# Patient Record
Sex: Male | Born: 1940
Health system: Southern US, Community
[De-identification: ages and names within clinical notes are randomized; demographics above are authoritative.]

## PROBLEM LIST (undated history)

## (undated) DIAGNOSIS — C229 Malignant neoplasm of liver, not specified as primary or secondary: Secondary | ICD-10-CM

## (undated) DIAGNOSIS — I639 Cerebral infarction, unspecified: Secondary | ICD-10-CM

## (undated) DIAGNOSIS — R29898 Other symptoms and signs involving the musculoskeletal system: Secondary | ICD-10-CM

## (undated) DIAGNOSIS — N400 Enlarged prostate without lower urinary tract symptoms: Secondary | ICD-10-CM

## (undated) DIAGNOSIS — Z45018 Encounter for adjustment and management of other part of cardiac pacemaker: Secondary | ICD-10-CM

## (undated) DIAGNOSIS — K7460 Unspecified cirrhosis of liver: Secondary | ICD-10-CM

## (undated) DIAGNOSIS — I4891 Unspecified atrial fibrillation: Secondary | ICD-10-CM

## (undated) DIAGNOSIS — I8390 Asymptomatic varicose veins of unspecified lower extremity: Secondary | ICD-10-CM

## (undated) DIAGNOSIS — Z950 Presence of cardiac pacemaker: Secondary | ICD-10-CM

## (undated) DIAGNOSIS — R0602 Shortness of breath: Secondary | ICD-10-CM

## (undated) HISTORY — PX: TONSILLECTOMY: SUR1361

## (undated) HISTORY — PX: LIVER RESECTION: SHX1977

## (undated) HISTORY — PX: EYE SURGERY: SHX253

## (undated) HISTORY — PX: OTHER SURGICAL HISTORY: SHX169

## (undated) HISTORY — DX: Asymptomatic varicose veins of unspecified lower extremity: I83.90

## (undated) HISTORY — PX: APPENDECTOMY: SHX54

## (undated) HISTORY — DX: Shortness of breath: R06.02

---
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
## 2005-05-21 ENCOUNTER — Encounter: Admission: RE | Admit: 2005-05-21 | Discharge: 2005-08-19 | Payer: Self-pay | Admitting: Ophthalmology

## 2007-08-22 ENCOUNTER — Encounter: Admission: RE | Admit: 2007-08-22 | Discharge: 2007-08-22 | Payer: Self-pay

## 2007-10-01 ENCOUNTER — Ambulatory Visit (HOSPITAL_COMMUNITY): Admission: AD | Admit: 2007-10-01 | Discharge: 2007-10-02 | Payer: Self-pay | Admitting: Orthopaedic Surgery

## 2008-02-02 ENCOUNTER — Encounter: Admission: RE | Admit: 2008-02-02 | Discharge: 2008-02-02 | Payer: Self-pay | Admitting: *Deleted

## 2009-04-03 ENCOUNTER — Ambulatory Visit (HOSPITAL_COMMUNITY): Admission: RE | Admit: 2009-04-03 | Discharge: 2009-04-03 | Payer: Self-pay | Admitting: Internal Medicine

## 2009-05-26 ENCOUNTER — Ambulatory Visit: Payer: Self-pay | Admitting: Internal Medicine

## 2009-05-26 ENCOUNTER — Inpatient Hospital Stay (HOSPITAL_COMMUNITY): Admission: EM | Admit: 2009-05-26 | Discharge: 2009-06-01 | Payer: Self-pay | Admitting: Emergency Medicine

## 2009-09-08 ENCOUNTER — Emergency Department (HOSPITAL_COMMUNITY): Admission: EM | Admit: 2009-09-08 | Discharge: 2009-09-08 | Payer: Self-pay | Admitting: Emergency Medicine

## 2010-06-27 LAB — LACTIC ACID, PLASMA: Lactic Acid, Venous: 2.2 mmol/L (ref 0.5–2.2)

## 2010-06-27 LAB — GLUCOSE, CAPILLARY
Glucose-Capillary: 125 mg/dL — ABNORMAL HIGH (ref 70–99)
Glucose-Capillary: 133 mg/dL — ABNORMAL HIGH (ref 70–99)
Glucose-Capillary: 134 mg/dL — ABNORMAL HIGH (ref 70–99)
Glucose-Capillary: 143 mg/dL — ABNORMAL HIGH (ref 70–99)
Glucose-Capillary: 146 mg/dL — ABNORMAL HIGH (ref 70–99)
Glucose-Capillary: 148 mg/dL — ABNORMAL HIGH (ref 70–99)
Glucose-Capillary: 167 mg/dL — ABNORMAL HIGH (ref 70–99)
Glucose-Capillary: 169 mg/dL — ABNORMAL HIGH (ref 70–99)
Glucose-Capillary: 173 mg/dL — ABNORMAL HIGH (ref 70–99)
Glucose-Capillary: 206 mg/dL — ABNORMAL HIGH (ref 70–99)
Glucose-Capillary: 229 mg/dL — ABNORMAL HIGH (ref 70–99)
Glucose-Capillary: 242 mg/dL — ABNORMAL HIGH (ref 70–99)
Glucose-Capillary: 272 mg/dL — ABNORMAL HIGH (ref 70–99)
Glucose-Capillary: 273 mg/dL — ABNORMAL HIGH (ref 70–99)
Glucose-Capillary: 63 mg/dL — ABNORMAL LOW (ref 70–99)
Glucose-Capillary: 65 mg/dL — ABNORMAL LOW (ref 70–99)
Glucose-Capillary: 75 mg/dL (ref 70–99)

## 2010-06-27 LAB — BASIC METABOLIC PANEL
BUN: 16 mg/dL (ref 6–23)
BUN: 20 mg/dL (ref 6–23)
CO2: 22 mEq/L (ref 19–32)
CO2: 23 mEq/L (ref 19–32)
CO2: 23 mEq/L (ref 19–32)
Calcium: 8.2 mg/dL — ABNORMAL LOW (ref 8.4–10.5)
Calcium: 8.4 mg/dL (ref 8.4–10.5)
Creatinine, Ser: 0.84 mg/dL (ref 0.4–1.5)
Creatinine, Ser: 0.84 mg/dL (ref 0.4–1.5)
GFR calc Af Amer: >60 mL/min (ref >60)
GFR calc non Af Amer: >60 mL/min (ref >60)
Glucose, Bld: 101 mg/dL — ABNORMAL HIGH (ref 70–99)
Glucose, Bld: 199 mg/dL — ABNORMAL HIGH (ref 70–99)
Glucose, Bld: 224 mg/dL — ABNORMAL HIGH (ref 70–99)
Potassium: 4 mEq/L (ref 3.5–5.1)
Sodium: 136 mEq/L (ref 135–145)
Sodium: 137 mEq/L (ref 135–145)

## 2010-06-27 LAB — DIFFERENTIAL
Basophils Relative: 0 % (ref 0–1)
Eosinophils Absolute: 0 10*3/uL (ref 0.0–0.7)
Eosinophils Relative: 0 % (ref 0–5)
Lymphocytes Relative: 3 % — ABNORMAL LOW (ref 12–46)
Lymphs Abs: 0.1 10*3/uL — ABNORMAL LOW (ref 0.7–4.0)
Neutrophils Relative %: 87 % — ABNORMAL HIGH (ref 43–77)

## 2010-06-27 LAB — POCT I-STAT, CHEM 8
Calcium, Ion: 1.09 mmol/L — ABNORMAL LOW (ref 1.12–1.32)
Chloride: 107 mEq/L (ref 96–112)
Creatinine, Ser: 1.1 mg/dL (ref 0.4–1.5)
Glucose, Bld: 126 mg/dL — ABNORMAL HIGH (ref 70–99)
Hemoglobin: 10.5 g/dL — ABNORMAL LOW (ref 13.0–17.0)
TCO2: 22 mmol/L (ref 0–100)

## 2010-06-27 LAB — CBC
HCT: 30.4 % — ABNORMAL LOW (ref 39.0–52.0)
Hemoglobin: 10.4 g/dL — ABNORMAL LOW (ref 13.0–17.0)
Hemoglobin: 9.6 g/dL — ABNORMAL LOW (ref 13.0–17.0)
MCHC: 34 g/dL (ref 30.0–36.0)
MCHC: 34.3 g/dL (ref 30.0–36.0)
MCHC: 34.3 g/dL (ref 30.0–36.0)
MCV: 91.6 fL (ref 78.0–100.0)
Platelets: 62 10*3/uL — ABNORMAL LOW (ref 150–400)
Platelets: 73 10*3/uL — ABNORMAL LOW (ref 150–400)
RBC: 3.4 MIL/uL — ABNORMAL LOW (ref 4.22–5.81)
RDW: 19.2 % — ABNORMAL HIGH (ref 11.5–15.5)
RDW: 19.6 % — ABNORMAL HIGH (ref 11.5–15.5)
RDW: 20.1 % — ABNORMAL HIGH (ref 11.5–15.5)
WBC: 5 10*3/uL (ref 4.0–10.5)

## 2010-06-27 LAB — URINALYSIS, ROUTINE W REFLEX MICROSCOPIC
Leukocytes, UA: NEGATIVE
Nitrite: NEGATIVE
Specific Gravity, Urine: 1.022 (ref 1.005–1.030)
pH: 5.5 (ref 5.0–8.0)

## 2010-06-27 LAB — LEGIONELLA ANTIGEN, URINE: Legionella Antigen, Urine: NEGATIVE

## 2010-06-27 LAB — CULTURE, BLOOD (ROUTINE X 2)
Culture: NO GROWTH
Culture: NO GROWTH

## 2010-06-27 LAB — HEPATIC FUNCTION PANEL
Albumin: 2.9 g/dL — ABNORMAL LOW (ref 3.5–5.2)
Bilirubin, Direct: 0.2 mg/dL (ref 0.0–0.3)
Indirect Bilirubin: 0.5 mg/dL (ref 0.3–0.9)
Total Protein: 6.3 g/dL (ref 6.0–8.3)

## 2010-06-27 LAB — POCT I-STAT 3, ART BLOOD GAS (G3+)
Acid-base deficit: 2 mmol/L (ref 0.0–2.0)
pCO2 arterial: 29.7 mmHg — ABNORMAL LOW (ref 35.0–45.0)

## 2010-06-27 LAB — URINE MICROSCOPIC-ADD ON

## 2010-06-27 LAB — STREP PNEUMONIAE URINARY ANTIGEN: Strep Pneumo Urinary Antigen: NEGATIVE

## 2010-06-27 LAB — MRSA PCR SCREENING: MRSA by PCR: POSITIVE — AB

## 2010-06-27 LAB — CK TOTAL AND CKMB (NOT AT ARMC)
CK, MB: 1.1 ng/mL (ref 0.3–4.0)
Relative Index: INVALID (ref 0.0–2.5)

## 2010-06-27 LAB — URINE CULTURE

## 2010-06-27 LAB — PROTIME-INR
INR: 2.62 — ABNORMAL HIGH (ref 0.00–1.49)
INR: 2.69 — ABNORMAL HIGH (ref 0.00–1.49)
INR: 2.85 — ABNORMAL HIGH (ref 0.00–1.49)
Prothrombin Time: 23.5 seconds — ABNORMAL HIGH (ref 11.6–15.2)
Prothrombin Time: 23.9 seconds — ABNORMAL HIGH (ref 11.6–15.2)
Prothrombin Time: 36.1 seconds — ABNORMAL HIGH (ref 11.6–15.2)

## 2010-06-27 LAB — TROPONIN I: Troponin I: 0.06 ng/mL (ref 0.00–0.06)

## 2010-06-27 LAB — BRAIN NATRIURETIC PEPTIDE: Pro B Natriuretic peptide (BNP): 382 pg/mL — ABNORMAL HIGH (ref 0.0–100.0)

## 2010-06-27 LAB — CORTISOL: Cortisol, Plasma: 19.9 ug/dL

## 2010-07-26 ENCOUNTER — Encounter (INDEPENDENT_AMBULATORY_CARE_PROVIDER_SITE_OTHER): Payer: PRIVATE HEALTH INSURANCE

## 2010-07-26 DIAGNOSIS — M79609 Pain in unspecified limb: Secondary | ICD-10-CM

## 2010-07-31 NOTE — Procedures (Unsigned)
DUPLEX DEEP VENOUS EXAM - LOWER EXTREMITY  INDICATION:  Previous DVT, previous lymphoma, right thigh mass for 2 weeks.  HISTORY:  Edema:  Swelling for 2 weeks. Trauma/Surgery:  No. Pain:  Yes for 1 week. PE:  No. Previous DVT:  Left popliteal 2010. Anticoagulants:  Long-term Coumadin. Other:  DUPLEX EXAM:               CFV   SFV   PopV  PTV    GSV               R  L  R  L  R  L  R   L  R  L Thrombosis    0  0  0     0     0      0 Spontaneous   +  +  +     +     +      + Phasic        +  +  +     +     +      + Augmentation  +  +  +     +     +      + Compressible  +  +  +     +     +      + Competent     0     0     0     0      0  Legend:  + - yes  o - no  p - partial  D - decreased  IMPRESSION: 1. No evidence of deep venous thrombosis or superficial venous     thrombophlebitis in the right lower extremity.  There is reflux     observed throughout the deep and superficial venous system. 2. Incidentally, there is a complex mass in the proximal medial thigh     that appears to have vascular flow.  Further imaging suggested to     better characterize this area, clinical correlation recommended.    _____________________________ V. Leia Alf, MD  LT/MEDQ  D:  07/26/2010  T:  07/26/2010  Job:  [id number]

## 2010-08-21 NOTE — Op Note (Signed)
NAME:  KAIS, MONJE          ACCOUNT NO.:  [id number]   MEDICAL RECORD NO.:  [id number]          PATIENT TYPE:  OIB   LOCATION:  2550                         FACILITY:  [facility]   PHYSICIAN:  Lind Guest. Ninfa Linden, M.D.DATE OF BIRTH:  [birth year]   DATE OF PROCEDURE:  DATE OF DISCHARGE:                               OPERATIVE REPORT   PREOPERATIVE DIAGNOSES:  1. Left thumb hand and wrist infectious tenosynovitis status post dog      bite.  2. Traumatic extensor pollicis longus (EPL) tendon rupture.   POSTOPERATIVE DIAGNOSES:  1. Left thumb hand and wrist infectious tenosynovitis status post dog      bite.  2. Traumatic extensor pollicis longus (EPL) tendon rupture.   PROCEDURE:  1. Irrigation and debridement of left thumb wound and left first,      second, and third dorsal extensor compartments.  2. Delayed direct primary repair of left thumb extensor pollicis      longus tendon.   SURGEON:  Mcarthur Rossetti, MD   ANESTHESIA:  Axillary block.   ANTIBIOTICS:  1 gram vancomycin after cultures obtained.   TOURNIQUET TIME:  Less than 1 hour.   ESTIMATED BLOOD LOSS:  Minimal.   COMPLICATIONS:  None.   FINDINGS:  Rupture of extensor pollicis longus tendon left thumb and  infectious tenosynovitis involving the EPL tendon first, second, and  third dorsal compartments.  Cultures pending, no gross purulence  encountered.   INDICATIONS:  Briefly Mr. Keith [name] is a 70 year old gentleman who 8  days ago was bitten by his own dog at home when he was breaking up a  fight between his 2 little lap dogs.  He had bite wound on the dorsum of  his thumb near the MCP joint of the thumb as well as in the palm.  He  was seen by his primary care physician and appropriate was started on  antibiotics including amoxicillin and Levaquin.  He eventually had  redness and streaking in his hand and his arm with this, that seemed to  calm down with his antibiotics.  However, he had  inability to fully  extend his thumb.  He did not relay that to his primary care physician.  However, after waking up this morning and having some sloughing of the  skin.  He was concerned enough and consultation was made to my office.  On examination, it was felt he still had residual symptoms of infection,  but more importantly he also had tenosynovitis of the dorsal  compartments in the wrist and hand as well as a rupture of the EPL  tendon.  I recommended he undergo irrigation debridement and attempted  repair of the tendon.  The risks and benefits of this were explained him  in length and he agrees to proceed with surgery.   DESCRIPTION OF PROCEDURE:  After informed consent was obtained, the  appropriate left arm was marked and axillary block was obtained by  anesthesia.  He was then comfortably taken to the operating room, placed  supine on the operating table.  A nonsterile tourniquet was placed  around his upper arm.  His arm was  prepped and draped with Hibiclens.  A  time-out was called to identify the correct patient and correct left  arm.  I then had the tourniquet inflated to 250 mm of pressure, tested  that adequate anesthesia with axillary block was obtained and then I  started the incision with a puncture wound of the thumb.  I carried this  proximal and distally all the way down thumb metacarpal level down to  the first dorsal compartment.  There was a soupy fluid along this area,  did not appear gross purulent, but the tissue itself just looked  terrible.  You could see that there was severing of the EPL tendon and  the distal stump appeared as if  were the end of a mop.  I then made  decision to continue to explore to find the proximal stump of the EPL  tendon.  I made an incision over the second and third dorsal compartment  centered over Lister's tubercle.  I was able to dissect down to the  second and third of compartment, both of these had abundant fluid  suggesting  infectious tenosynovitis as well.  The EPL tendon itself was  found that by Lister's tubercle and just really looked ratty.  I then  removed the EPL tendon from its position ulnar to Lister's tubercle and  brought it  more radial direction so I could hopefully obtain a direct  primary repair of the tendon.  I was able to bring the final proximal  stump of the tendon and bring it back to resting position near its  distal separate component both ends looked again very tenuous.  I then  placed a 2-0 FiberWire suture to bring this together as a direct primary  repair, it did leave the thumb and IP joint in slight hyperextended  position but I was able to put the thumb through some slight flexion  extension and felt that this was intact.  I then copiously irrigated the  tissue again with a full liter of normal saline solution followed by 500  mL of bacitracin solution followed by an additional liter of normal  saline solution through the tendon sheath as well.  I then closed the  skin through both incisions loosely with interrupted 3-0 nylon suture.  Xeroform followed up with well-padded sterile dressing was applied.  I  put the thumb and wrist in a splint with the wrist slightly dorsiflexed  to take pressure off the tendon repair.  The tourniquet was let down and  the fingers did pink nicely.  He remained awake throughout the case.  He  was taken to recovery room in stable condition.  All final counts were  correct and there no complications noted.  Postoperatively, he will be  admitted for 23-hour observation for IV antibiotics with hopeful  discharge in the morning.      Lind Guest. Ninfa Linden, M.D.  Electronically Signed     CYB/MEDQ  D:  10/01/2007  T:  10/02/2007  Job:  [id number]

## 2010-11-03 ENCOUNTER — Encounter: Payer: Self-pay | Admitting: Emergency Medicine

## 2010-11-03 ENCOUNTER — Emergency Department (HOSPITAL_BASED_OUTPATIENT_CLINIC_OR_DEPARTMENT_OTHER)
Admission: EM | Admit: 2010-11-03 | Discharge: 2010-11-03 | Disposition: A | Discharge status: Home / Self Care | Payer: Medicare Other | Attending: Emergency Medicine | Admitting: Emergency Medicine

## 2010-11-03 ENCOUNTER — Emergency Department (INDEPENDENT_AMBULATORY_CARE_PROVIDER_SITE_OTHER): Payer: Medicare Other

## 2010-11-03 DIAGNOSIS — Z8679 Personal history of other diseases of the circulatory system: Secondary | ICD-10-CM | POA: Insufficient documentation

## 2010-11-03 DIAGNOSIS — R161 Splenomegaly, not elsewhere classified: Secondary | ICD-10-CM

## 2010-11-03 DIAGNOSIS — K7460 Unspecified cirrhosis of liver: Secondary | ICD-10-CM

## 2010-11-03 DIAGNOSIS — I10 Essential (primary) hypertension: Secondary | ICD-10-CM | POA: Insufficient documentation

## 2010-11-03 DIAGNOSIS — R109 Unspecified abdominal pain: Secondary | ICD-10-CM

## 2010-11-03 DIAGNOSIS — R42 Dizziness and giddiness: Secondary | ICD-10-CM

## 2010-11-03 DIAGNOSIS — K5289 Other specified noninfective gastroenteritis and colitis: Secondary | ICD-10-CM | POA: Insufficient documentation

## 2010-11-03 DIAGNOSIS — E119 Type 2 diabetes mellitus without complications: Secondary | ICD-10-CM | POA: Insufficient documentation

## 2010-11-03 HISTORY — DX: Unspecified cirrhosis of liver: K74.60

## 2010-11-03 HISTORY — DX: Cerebral infarction, unspecified: I63.9

## 2010-11-03 HISTORY — DX: Benign prostatic hyperplasia without lower urinary tract symptoms: N40.0

## 2010-11-03 LAB — DIFFERENTIAL
Blasts: 0 %
Lymphocytes Relative: 12 % (ref 12–46)
Metamyelocytes Relative: 0 %
Monocytes Absolute: 0.7 10*3/uL (ref 0.1–1.0)
Monocytes Relative: 16 % — ABNORMAL HIGH (ref 3–12)
Promyelocytes Absolute: 0 %
nRBC: 0 /100 WBC

## 2010-11-03 LAB — COMPREHENSIVE METABOLIC PANEL
ALT: 63 U/L — ABNORMAL HIGH (ref 0–53)
Alkaline Phosphatase: 56 U/L (ref 39–117)
CO2: 21 mEq/L (ref 19–32)
Calcium: 8.8 mg/dL (ref 8.4–10.5)
Chloride: 102 mEq/L (ref 96–112)
GFR calc Af Amer: >60 mL/min (ref >60)
GFR calc non Af Amer: >60 mL/min (ref >60)
Glucose, Bld: 104 mg/dL — ABNORMAL HIGH (ref 70–99)
Sodium: 136 mEq/L (ref 135–145)
Total Bilirubin: 0.9 mg/dL (ref 0.3–1.2)

## 2010-11-03 LAB — CBC
MCHC: 35 g/dL (ref 30.0–36.0)
MCV: 88.8 fL (ref 78.0–100.0)
Platelets: 65 10*3/uL — ABNORMAL LOW (ref 150–400)
RDW: 14.6 % (ref 11.5–15.5)
WBC: 4.5 10*3/uL (ref 4.0–10.5)

## 2010-11-03 LAB — URINALYSIS, ROUTINE W REFLEX MICROSCOPIC
Bilirubin Urine: NEGATIVE
Hgb urine dipstick: NEGATIVE
Ketones, ur: NEGATIVE mg/dL
Nitrite: NEGATIVE
Protein, ur: NEGATIVE mg/dL
Specific Gravity, Urine: 1.02 (ref 1.005–1.030)
Urobilinogen, UA: 0.2 mg/dL (ref 0.0–1.0)

## 2010-11-03 LAB — GLUCOSE, CAPILLARY: Glucose-Capillary: 80 mg/dL (ref 70–99)

## 2010-11-03 LAB — PROTIME-INR: INR: 2.41 — ABNORMAL HIGH (ref 0.00–1.49)

## 2010-11-03 MED ORDER — CIPROFLOXACIN HCL 500 MG PO TABS
500.0000 mg | ORAL_TABLET | Freq: Once | ORAL | Status: AC
  Administered 2010-11-03: 500 mg via ORAL
  Filled 2010-11-03: qty 1

## 2010-11-03 MED ORDER — MORPHINE SULFATE 4 MG/ML IJ SOLN
6.0000 mg | Freq: Once | INTRAMUSCULAR | Status: AC
  Administered 2010-11-03: 4 mg via INTRAVENOUS
  Filled 2010-11-03: qty 1

## 2010-11-03 MED ORDER — METRONIDAZOLE 500 MG PO TABS: 500.0000 mg | ORAL_TABLET | Freq: Two times a day (BID) | ORAL | Status: AC

## 2010-11-03 MED ORDER — METRONIDAZOLE 500 MG PO TABS
500.0000 mg | ORAL_TABLET | Freq: Once | ORAL | Status: AC
  Administered 2010-11-03: 500 mg via ORAL
  Filled 2010-11-03: qty 1

## 2010-11-03 MED ORDER — ONDANSETRON HCL 4 MG/2ML IJ SOLN
4.0000 mg | Freq: Once | INTRAMUSCULAR | Status: AC
  Administered 2010-11-03: 4 mg via INTRAVENOUS
  Filled 2010-11-03: qty 2

## 2010-11-03 MED ORDER — SODIUM CHLORIDE 0.9 % IV SOLN
Freq: Once | INTRAVENOUS | Status: AC
  Administered 2010-11-03: 11:00:00 via INTRAVENOUS

## 2010-11-03 MED ORDER — CIPROFLOXACIN HCL 500 MG PO TABS: 500.0000 mg | ORAL_TABLET | Freq: Two times a day (BID) | ORAL | Status: AC

## 2010-11-03 NOTE — ED Notes (Signed)
Pt and family reports diarrhea 3 days ago with [place] food with increased weakness and fever last PM

## 2010-11-03 NOTE — ED Provider Notes (Addendum)
History     Chief Complaint  Patient presents with  . Dizziness   Patient is a 70 y.o. male presenting with abdominal pain. The history is provided by the patient and the spouse.  Abdominal Pain The primary symptoms of the illness include abdominal pain, fever, fatigue and diarrhea. The primary symptoms of the illness do not include shortness of breath, nausea, vomiting, hematemesis or dysuria. The current episode started 2 days ago. The onset of the illness was gradual. The problem has been gradually worsening.  The abdominal pain is located in the left flank. The abdominal pain radiates to the LLQ. The severity of the abdominal pain is 5/10. The abdominal pain is relieved by nothing.  Additional symptoms associated with the illness include chills and back pain. Symptoms associated with the illness do not include diaphoresis, urgency, hematuria or frequency.    Past Medical History  Diagnosis Date  . Diabetes mellitus   . Hypertension   . Stroke   . Prostate hypertrophy   . Cirrhosis of liver     Past Surgical History  Procedure Date  . Liver resection     Family History  Problem Relation Age of Onset  . Stroke Father     History  Substance Use Topics  . Smoking status: Never Smoker   . Smokeless tobacco: Not on file  . Alcohol Use: No      Review of Systems  Constitutional: Positive for fever, chills and fatigue. Negative for diaphoresis.  Respiratory: Negative for shortness of breath.   Gastrointestinal: Positive for abdominal pain and diarrhea. Negative for nausea, vomiting, blood in stool and hematemesis.  Genitourinary: Negative for dysuria, urgency, frequency and hematuria.  Musculoskeletal: Positive for back pain.  All other systems reviewed and are negative.    Physical Exam  BP 95/54  Pulse 73  Temp(Src) 98.8 F (37.1 C) (Oral)  Resp 22  SpO2 97%  Physical Exam  Nursing note and vitals reviewed. Constitutional: He is oriented to person, place,  and time. He appears well-developed.  HENT:  Head: Normocephalic and atraumatic.       Moist mucus membranes   Eyes: Pupils are equal, round, and reactive to light.  Cardiovascular: Normal rate, regular rhythm and normal heart sounds.   No murmur heard. Pulmonary/Chest: No respiratory distress. He has no wheezes. He has no rales. He exhibits no tenderness.  Abdominal: He exhibits no distension. There is tenderness.    Musculoskeletal: Normal range of motion. He exhibits no tenderness.  Neurological: He is alert and oriented to person, place, and time.  Skin: Skin is warm and dry. He is not diaphoretic.    ED Course  Procedures  MDM PT with left side abd pain, only mild tenderness on palpation.  Not septic appearing.  Started after eating chinese food.  On coumadin with therapeutic INR, doubt mesenteric ischemia.  Feels much better, wants to go home.  Will start cipro/flagyl, f/u with his doctor on Monday.  Advised him that he will need to have his INR checked while on abx.  His SBP is in 90s, says that it normally runs around 110.  Denies dizziness, will ambulate prior to d/c     Malvin Johns, MD 11/03/10 [place], MD 11/03/10 1443

## 2011-01-03 LAB — CBC
HCT: 37.9 — ABNORMAL LOW
Hemoglobin: 13.1
Platelets: 85 — ABNORMAL LOW
RDW: 14.1
WBC: 5.4

## 2011-01-03 LAB — BASIC METABOLIC PANEL
Calcium: 8.5
GFR calc non Af Amer: >60
Potassium: 3.1 — ABNORMAL LOW
Sodium: 139

## 2011-01-03 LAB — WOUND CULTURE: Culture: NO GROWTH

## 2011-01-03 LAB — ANAEROBIC CULTURE: Gram Stain: NONE SEEN

## 2011-01-03 LAB — DIFFERENTIAL
Basophils Absolute: 0
Eosinophils Relative: 1
Lymphocytes Relative: 30
Lymphs Abs: 1.6
Monocytes Absolute: 0.5
Neutro Abs: 3.2

## 2011-01-03 LAB — HEPATIC FUNCTION PANEL
ALT: 33
AST: 39 — ABNORMAL HIGH
Alkaline Phosphatase: 64
Indirect Bilirubin: 0.6
Total Protein: 6.5

## 2012-07-09 ENCOUNTER — Emergency Department (HOSPITAL_BASED_OUTPATIENT_CLINIC_OR_DEPARTMENT_OTHER)
Admission: EM | Admit: 2012-07-09 | Discharge: 2012-07-09 | Disposition: A | Discharge status: Home / Self Care | Payer: Medicare Other | Attending: Emergency Medicine | Admitting: Emergency Medicine

## 2012-07-09 ENCOUNTER — Encounter (HOSPITAL_BASED_OUTPATIENT_CLINIC_OR_DEPARTMENT_OTHER): Payer: Self-pay | Admitting: *Deleted

## 2012-07-09 ENCOUNTER — Emergency Department (HOSPITAL_BASED_OUTPATIENT_CLINIC_OR_DEPARTMENT_OTHER): Payer: Medicare Other

## 2012-07-09 DIAGNOSIS — S0003XA Contusion of scalp, initial encounter: Secondary | ICD-10-CM | POA: Insufficient documentation

## 2012-07-09 DIAGNOSIS — I10 Essential (primary) hypertension: Secondary | ICD-10-CM | POA: Insufficient documentation

## 2012-07-09 DIAGNOSIS — S0083XA Contusion of other part of head, initial encounter: Secondary | ICD-10-CM

## 2012-07-09 DIAGNOSIS — Z23 Encounter for immunization: Secondary | ICD-10-CM | POA: Insufficient documentation

## 2012-07-09 DIAGNOSIS — Z794 Long term (current) use of insulin: Secondary | ICD-10-CM | POA: Insufficient documentation

## 2012-07-09 DIAGNOSIS — Z7902 Long term (current) use of antithrombotics/antiplatelets: Secondary | ICD-10-CM | POA: Insufficient documentation

## 2012-07-09 DIAGNOSIS — Z79899 Other long term (current) drug therapy: Secondary | ICD-10-CM | POA: Insufficient documentation

## 2012-07-09 DIAGNOSIS — R55 Syncope and collapse: Secondary | ICD-10-CM | POA: Insufficient documentation

## 2012-07-09 DIAGNOSIS — W1789XA Other fall from one level to another, initial encounter: Secondary | ICD-10-CM | POA: Insufficient documentation

## 2012-07-09 DIAGNOSIS — Y92009 Unspecified place in unspecified non-institutional (private) residence as the place of occurrence of the external cause: Secondary | ICD-10-CM | POA: Insufficient documentation

## 2012-07-09 DIAGNOSIS — R011 Cardiac murmur, unspecified: Secondary | ICD-10-CM | POA: Insufficient documentation

## 2012-07-09 DIAGNOSIS — Z8719 Personal history of other diseases of the digestive system: Secondary | ICD-10-CM | POA: Insufficient documentation

## 2012-07-09 DIAGNOSIS — Z7901 Long term (current) use of anticoagulants: Secondary | ICD-10-CM | POA: Insufficient documentation

## 2012-07-09 DIAGNOSIS — Y9301 Activity, walking, marching and hiking: Secondary | ICD-10-CM | POA: Insufficient documentation

## 2012-07-09 DIAGNOSIS — S63509A Unspecified sprain of unspecified wrist, initial encounter: Secondary | ICD-10-CM | POA: Insufficient documentation

## 2012-07-09 DIAGNOSIS — Z87448 Personal history of other diseases of urinary system: Secondary | ICD-10-CM | POA: Insufficient documentation

## 2012-07-09 DIAGNOSIS — E119 Type 2 diabetes mellitus without complications: Secondary | ICD-10-CM | POA: Insufficient documentation

## 2012-07-09 DIAGNOSIS — Z8673 Personal history of transient ischemic attack (TIA), and cerebral infarction without residual deficits: Secondary | ICD-10-CM | POA: Insufficient documentation

## 2012-07-09 DIAGNOSIS — W19XXXA Unspecified fall, initial encounter: Secondary | ICD-10-CM

## 2012-07-09 DIAGNOSIS — S63501A Unspecified sprain of right wrist, initial encounter: Secondary | ICD-10-CM

## 2012-07-09 DIAGNOSIS — R404 Transient alteration of awareness: Secondary | ICD-10-CM | POA: Insufficient documentation

## 2012-07-09 HISTORY — DX: Malignant neoplasm of liver, not specified as primary or secondary: C22.9

## 2012-07-09 LAB — CBC WITH DIFFERENTIAL/PLATELET
Basophils Absolute: 0 10*3/uL (ref 0.0–0.1)
Basophils Relative: 0 % (ref 0–1)
Eosinophils Relative: 1 % (ref 0–5)
Hemoglobin: 12.4 g/dL — ABNORMAL LOW (ref 13.0–17.0)
MCH: 30.2 pg (ref 26.0–34.0)
MCV: 86.6 fL (ref 78.0–100.0)
Monocytes Absolute: 0.7 10*3/uL (ref 0.1–1.0)
Neutrophils Relative %: 63 % (ref 43–77)
RBC: 4.11 MIL/uL — ABNORMAL LOW (ref 4.22–5.81)

## 2012-07-09 LAB — BASIC METABOLIC PANEL
Calcium: 9 mg/dL (ref 8.4–10.5)
Chloride: 106 mEq/L (ref 96–112)
Creatinine, Ser: 1.4 mg/dL — ABNORMAL HIGH (ref 0.50–1.35)
GFR calc Af Amer: 57 mL/min — ABNORMAL LOW (ref >90)
Sodium: 142 mEq/L (ref 135–145)

## 2012-07-09 LAB — APTT: aPTT: 36 seconds (ref 24–37)

## 2012-07-09 LAB — PROTIME-INR: Prothrombin Time: 23.6 seconds — ABNORMAL HIGH (ref 11.6–15.2)

## 2012-07-09 MED ORDER — TETANUS-DIPHTH-ACELL PERTUSSIS 5-2.5-18.5 LF-MCG/0.5 IM SUSP
0.5000 mL | Freq: Once | INTRAMUSCULAR | Status: AC
  Administered 2012-07-09: 0.5 mL via INTRAMUSCULAR
  Filled 2012-07-09: qty 0.5

## 2012-07-09 NOTE — ED Provider Notes (Signed)
History     CSN: [id number]  Arrival date & time 07/09/12  2202   First MD Initiated Contact with Patient 07/09/12 1854      Chief Complaint  Patient presents with  . Fall  . Loss of Consciousness    (Consider location/radiation/quality/duration/timing/severity/associated sxs/prior treatment) Patient is a 72 y.o. male presenting with fall and syncope. The history is provided by the patient.  Fall The accident occurred 3 to 5 hours ago. The fall occurred while walking (his right ankle locked up on him and he could not stop himself from falling and fell forward hiting his right shoulder and then face and passed out). He fell from a height of 1 to 2 ft. He landed on concrete. There was no blood loss. The point of impact was the head, right shoulder and right wrist. The pain is present in the right shoulder and right wrist (face). The pain is at a severity of 3/10. The pain is mild. He was ambulatory at the scene. Associated symptoms include loss of consciousness. Pertinent negatives include no visual change, no numbness, no abdominal pain, no vomiting and no headaches. Associated symptoms comments: Pt states he landed in the grass and then the next thing he remembers is the delivery people picking him up off the grass hours later. He has tried nothing for the symptoms. The treatment provided significant relief.  Loss of Consciousness  Pertinent negatives include abdominal pain, dizziness, headaches, light-headedness, visual change, vomiting and weakness.    Past Medical History  Diagnosis Date  . Diabetes mellitus   . Hypertension   . Stroke   . Prostate hypertrophy   . Cirrhosis of liver   . Hepatic cancer     Past Surgical History  Procedure Laterality Date  . Liver resection      Family History  Problem Relation Age of Onset  . Stroke Father     History  Substance Use Topics  . Smoking status: Never Smoker   . Smokeless tobacco: Not on file  . Alcohol Use: No       Review of Systems  Eyes: Negative for photophobia and visual disturbance.  Cardiovascular: Positive for syncope.  Gastrointestinal: Negative for vomiting and abdominal pain.  Neurological: Positive for loss of consciousness. Negative for dizziness, weakness, light-headedness, numbness and headaches.  All other systems reviewed and are negative.    Allergies  Iodine and Motrin  Home Medications   Current Outpatient Rx  Name  Route  Sig  Dispense  Refill  . clopidogrel (PLAVIX) 75 MG tablet   Oral   Take 75 mg by mouth daily.         . fentaNYL (DURAGESIC - DOSED MCG/HR) 100 MCG/HR   Transdermal   Place 1 patch onto the skin every 3 (three) days.         . pregabalin (LYRICA) 50 MG capsule   Oral   Take 50 mg by mouth 2 (two) times daily.         . simvastatin (ZOCOR) 10 MG tablet   Oral   Take 10 mg by mouth at bedtime.         . tamsulosin (FLOMAX) 0.4 MG CAPS   Oral   Take 0.4 mg by mouth.         . dipyridamole-aspirin (AGGRENOX) 25-200 MG per 12 hr capsule   Oral   Take 1 capsule by mouth 2 (two) times daily.           . fentaNYL (  FENTORA) 100 MCG buccal tablet   Buccal   Place 100 mcg inside cheek every 4 (four) hours as needed.           . finasteride (PROSCAR) 5 MG tablet   Oral   Take 5 mg by mouth daily.           . furosemide (LASIX) 20 MG tablet   Oral   Take 20 mg by mouth 2 (two) times daily.           Marland Kitchen gabapentin (NEURONTIN) 600 MG tablet   Oral   Take 600 mg by mouth 3 (three) times daily.           Marland Kitchen HYDROcodone-acetaminophen (LORTAB) 10-500 MG per tablet   Oral   Take 1 tablet by mouth every 6 (six) hours as needed.           . insulin aspart (NOVOLOG) 100 UNIT/ML injection   Subcutaneous   Inject into the skin 3 (three) times daily before meals.           . insulin glargine (LANTUS) 100 UNIT/ML injection   Subcutaneous   Inject 39 Units into the skin daily.           Marland Kitchen loratadine (CLARITIN  REDITABS) 10 MG dissolvable tablet   Oral   Take 10 mg by mouth daily.           Marland Kitchen omeprazole (PRILOSEC) 20 MG capsule   Oral   Take 20 mg by mouth daily.           Marland Kitchen spironolactone (ALDACTONE) 100 MG tablet   Oral   Take 100 mg by mouth daily.           Marland Kitchen warfarin (COUMADIN) 5 MG tablet   Oral   Take 5 mg by mouth daily.             BP 125/66  Pulse 61  Temp(Src) 97.9 F (36.6 C) (Oral)  Resp 18  Ht 5' 10"  (1.778 m)  Wt 270 lb (122.471 kg)  BMI 38.74 kg/m2  SpO2 95%  Physical Exam  Nursing note and vitals reviewed. Constitutional: He is oriented to person, place, and time. He appears well-developed and well-nourished. No distress.  HENT:  Head: Normocephalic and atraumatic.    Mouth/Throat: Oropharynx is clear and moist.  Eyes: Conjunctivae and EOM are normal.  Right pupil is 44m and minimally reactive.  Left pupil is 254mand minimally reactive  Neck: Normal range of motion. Neck supple. No spinous process tenderness and no muscular tenderness present.  Cardiovascular: Normal rate, regular rhythm and intact distal pulses.   Murmur heard. Pulmonary/Chest: Effort normal and breath sounds normal. No respiratory distress. He has no wheezes. He has no rales.  Abdominal: Soft. He exhibits no distension. There is no tenderness. There is no rebound and no guarding.  Musculoskeletal: Normal range of motion. He exhibits tenderness. He exhibits no edema.       Right shoulder: He exhibits tenderness and bony tenderness. He exhibits normal range of motion, no deformity, normal pulse and normal strength.       Right wrist: He exhibits tenderness, bony tenderness and swelling. He exhibits normal range of motion and no deformity.       Right hip: Normal.       Left hip: Normal.       Right ankle: Normal.       Left ankle: Normal.       Cervical back: Normal.  Thoracic back: Normal.       Lumbar back: Normal.  Neurological: He is alert and oriented to person, place,  and time.  Skin: Skin is warm and dry. No rash noted. No erythema.  Psychiatric: He has a normal mood and affect. His behavior is normal.    ED Course  Procedures (including critical care time)  Labs Reviewed  CBC WITH DIFFERENTIAL - Abnormal; Notable for the following:    RBC 4.11 (*)    Hemoglobin 12.4 (*)    HCT 35.6 (*)    Platelets 89 (*)    Monocytes Relative 14 (*)    All other components within normal limits  BASIC METABOLIC PANEL - Abnormal; Notable for the following:    BUN 27 (*)    Creatinine, Ser 1.40 (*)    GFR calc non Af Amer 49 (*)    GFR calc Af Amer 57 (*)    All other components within normal limits  PROTIME-INR - Abnormal; Notable for the following:    Prothrombin Time 23.6 (*)    INR 2.21 (*)    All other components within normal limits  GLUCOSE, CAPILLARY - Abnormal; Notable for the following:    Glucose-Capillary 105 (*)    All other components within normal limits  APTT   Dg Shoulder Right  07/09/2012  *RADIOLOGY REPORT*  Clinical Data: Fall, right shoulder pain.  RIGHT SHOULDER - 2+ VIEW  Comparison: None.  Findings: No glenohumeral fracture or dislocation. Acromioclavicular joint intact.  Mild joint space narrowing. Adjacent ribs unremarkable.  IMPRESSION: No acute shoulder findings.   Original Report Authenticated By: Rolla Flatten, M.D.    Dg Wrist Complete Right  07/09/2012  *RADIOLOGY REPORT*  Clinical Data: Fall, wrist pain  RIGHT WRIST - COMPLETE 3+ VIEW  Comparison: None.  Findings: There is no evidence of fracture or dislocation.  There is no evidence of erosive arthropathy or other focal bony abnormality.  Soft tissues are unremarkable. Vascular calcification is prominent, consistent with diabetes.  Mild degenerative change radiocarpal joint.  IMPRESSION: No fractures seen   Original Report Authenticated By: Rolla Flatten, M.D.    Ct Head Wo Contrast  07/09/2012  *RADIOLOGY REPORT*  Clinical Data:  Fall with loss of consciousness.  Head pain.  Neck  pain.  Face pain.  CT HEAD WITHOUT CONTRAST CT MAXILLOFACIAL WITHOUT CONTRAST CT CERVICAL SPINE WITHOUT CONTRAST  Technique:  Multidetector CT imaging of the head, cervical spine, and maxillofacial structures were performed using the standard protocol without intravenous contrast. Multiplanar CT image reconstructions of the cervical spine and maxillofacial structures were also generated.  Comparison:   None  CT HEAD  Findings: Atrophy with chronic microvascular ischemic change, likely age related. No skull fracture or intracranial hemorrhage. No visible acute stroke. Left thalamic lacunar infarct.  No subarachnoid blood or subdural hematoma.  Vascular calcification. Bilateral cataract extraction.  No acute sinus or mastoid fluid/blood.  IMPRESSION: Age related changes as described.  CT MAXILLOFACIAL  Findings:  There is no visible facial fracture or mandibular dislocation.  Chronic sinus disease is present throughout the paranasal sinuses without blowout fracture.  Negative orbits. Partial dentures.  There is a small linear radiopaque density within the right nares(image 61 series 4) that could represent a foreign body. There is no evidence for nasal bone fracture. Soft tissue swelling of the nose is observed.  IMPRESSION: No visible facial fracture.  Question small foreign body right nares.  CT CERVICAL SPINE  Findings:   There is no visible  cervical spine fracture or traumatic subluxation.  Intervertebral disc spaces are preserved. Craniocervical junction and cervicothoracic junction intact.  No prevertebral soft tissue swelling or  intraspinal hematoma.  Mild multilevel facet arthropathy worst at C7-T1.  Clear lung apices.  IMPRESSION: No acute cervical spine abnormality.   Original Report Authenticated By: Rolla Flatten, M.D.    Ct Cervical Spine Wo Contrast  07/09/2012  *RADIOLOGY REPORT*  Clinical Data:  Fall with loss of consciousness.  Head pain.  Neck pain.  Face pain.  CT HEAD WITHOUT CONTRAST CT  MAXILLOFACIAL WITHOUT CONTRAST CT CERVICAL SPINE WITHOUT CONTRAST  Technique:  Multidetector CT imaging of the head, cervical spine, and maxillofacial structures were performed using the standard protocol without intravenous contrast. Multiplanar CT image reconstructions of the cervical spine and maxillofacial structures were also generated.  Comparison:   None  CT HEAD  Findings: Atrophy with chronic microvascular ischemic change, likely age related. No skull fracture or intracranial hemorrhage. No visible acute stroke. Left thalamic lacunar infarct.  No subarachnoid blood or subdural hematoma.  Vascular calcification. Bilateral cataract extraction.  No acute sinus or mastoid fluid/blood.  IMPRESSION: Age related changes as described.  CT MAXILLOFACIAL  Findings:  There is no visible facial fracture or mandibular dislocation.  Chronic sinus disease is present throughout the paranasal sinuses without blowout fracture.  Negative orbits. Partial dentures.  There is a small linear radiopaque density within the right nares(image 61 series 4) that could represent a foreign body. There is no evidence for nasal bone fracture. Soft tissue swelling of the nose is observed.  IMPRESSION: No visible facial fracture.  Question small foreign body right nares.  CT CERVICAL SPINE  Findings:   There is no visible cervical spine fracture or traumatic subluxation.  Intervertebral disc spaces are preserved. Craniocervical junction and cervicothoracic junction intact.  No prevertebral soft tissue swelling or  intraspinal hematoma.  Mild multilevel facet arthropathy worst at C7-T1.  Clear lung apices.  IMPRESSION: No acute cervical spine abnormality.   Original Report Authenticated By: Rolla Flatten, M.D.    Ct Maxillofacial Wo Cm  07/09/2012  *RADIOLOGY REPORT*  Clinical Data:  Fall with loss of consciousness.  Head pain.  Neck pain.  Face pain.  CT HEAD WITHOUT CONTRAST CT MAXILLOFACIAL WITHOUT CONTRAST CT CERVICAL SPINE WITHOUT  CONTRAST  Technique:  Multidetector CT imaging of the head, cervical spine, and maxillofacial structures were performed using the standard protocol without intravenous contrast. Multiplanar CT image reconstructions of the cervical spine and maxillofacial structures were also generated.  Comparison:   None  CT HEAD  Findings: Atrophy with chronic microvascular ischemic change, likely age related. No skull fracture or intracranial hemorrhage. No visible acute stroke. Left thalamic lacunar infarct.  No subarachnoid blood or subdural hematoma.  Vascular calcification. Bilateral cataract extraction.  No acute sinus or mastoid fluid/blood.  IMPRESSION: Age related changes as described.  CT MAXILLOFACIAL  Findings:  There is no visible facial fracture or mandibular dislocation.  Chronic sinus disease is present throughout the paranasal sinuses without blowout fracture.  Negative orbits. Partial dentures.  There is a small linear radiopaque density within the right nares(image 61 series 4) that could represent a foreign body. There is no evidence for nasal bone fracture. Soft tissue swelling of the nose is observed.  IMPRESSION: No visible facial fracture.  Question small foreign body right nares.  CT CERVICAL SPINE  Findings:   There is no visible cervical spine fracture or traumatic subluxation.  Intervertebral disc spaces are preserved. Craniocervical  junction and cervicothoracic junction intact.  No prevertebral soft tissue swelling or  intraspinal hematoma.  Mild multilevel facet arthropathy worst at C7-T1.  Clear lung apices.  IMPRESSION: No acute cervical spine abnormality.   Original Report Authenticated By: Rolla Flatten, M.D.      1. Fall at home, initial encounter   2. Facial contusion, initial encounter   3. Right wrist sprain, initial encounter       MDM   Patient with a mechanical fall today when his ankle locked up and he fell forward hitting his head. Patient was unconscious for several hours  until someone saw him lying in his yard and when it woke him up. He does take Coumadin daily and his INR was last checked 2 weeks ago and was 2.2.  Patient now is complaining of right-sided shoulder and arm pain where he fell but denies headache, blurred vision, weakness, numbness.  No deformity of the right shoulder however tenderness over the right shoulder and right wrist. Plain films will be obtained. Also will do a CT of the head, face and C-spine. INR pending. Patient denies being on Aggrenox or Plavix at this time but is still taking Coumadin regularly.  7:56 PM Imaging is negative for acute findings. Patient given strict return precautions and discharged home.      Blanchie Dessert, MD 07/09/12 1956

## 2012-07-09 NOTE — ED Notes (Signed)
Pt states his ankle gave out while in yard approx 230pm-hit head with LOC-pt was in yard and assisted into home-?down time

## 2012-07-09 NOTE — ED Notes (Signed)
BS 105

## 2012-07-09 NOTE — ED Notes (Signed)
Family at bedside. 

## 2012-11-17 ENCOUNTER — Other Ambulatory Visit: Payer: Self-pay | Admitting: Internal Medicine

## 2012-11-17 NOTE — Progress Notes (Signed)
Pt gets care at [facility] and is on Lyrica for DM neuropathy for past 1.5 yrs. He takes one 150 mg pill BID and one 50 mg pill BID for total dose of 200 mg BID. [facility] not [facility] another two days so I will provide coverage until he can get them from the [place]. 3 pills of each were Rx'd on paper script.

## 2012-11-20 ENCOUNTER — Other Ambulatory Visit: Payer: Self-pay | Admitting: Orthopedic Surgery

## 2012-11-26 ENCOUNTER — Encounter (HOSPITAL_COMMUNITY): Payer: Self-pay | Admitting: Pharmacy Technician

## 2012-12-03 ENCOUNTER — Ambulatory Visit (HOSPITAL_COMMUNITY)
Admission: RE | Admit: 2012-12-03 | Discharge: 2012-12-03 | Disposition: A | Discharge status: Home / Self Care | Payer: Medicare Other | Source: Ambulatory Visit | Attending: Orthopedic Surgery | Admitting: Orthopedic Surgery

## 2012-12-03 ENCOUNTER — Encounter (HOSPITAL_COMMUNITY): Payer: Self-pay

## 2012-12-03 ENCOUNTER — Encounter (HOSPITAL_COMMUNITY)
Admission: RE | Admit: 2012-12-03 | Discharge: 2012-12-03 | Disposition: A | Discharge status: Home / Self Care | Payer: Medicare Other | Source: Ambulatory Visit | Attending: Orthopedic Surgery | Admitting: Orthopedic Surgery

## 2012-12-03 DIAGNOSIS — R9431 Abnormal electrocardiogram [ECG] [EKG]: Secondary | ICD-10-CM | POA: Insufficient documentation

## 2012-12-03 DIAGNOSIS — Z01810 Encounter for preprocedural cardiovascular examination: Secondary | ICD-10-CM | POA: Insufficient documentation

## 2012-12-03 DIAGNOSIS — Z01812 Encounter for preprocedural laboratory examination: Secondary | ICD-10-CM | POA: Insufficient documentation

## 2012-12-03 DIAGNOSIS — Z01818 Encounter for other preprocedural examination: Secondary | ICD-10-CM | POA: Insufficient documentation

## 2012-12-03 LAB — URINALYSIS, ROUTINE W REFLEX MICROSCOPIC
Leukocytes, UA: NEGATIVE
Nitrite: NEGATIVE
Specific Gravity, Urine: 1.022 (ref 1.005–1.030)
pH: 5 (ref 5.0–8.0)

## 2012-12-03 LAB — CBC
MCH: 29.1 pg (ref 26.0–34.0)
MCV: 86.6 fL (ref 78.0–100.0)
Platelets: 101 10*3/uL — ABNORMAL LOW (ref 150–400)
RDW: 15.2 % (ref 11.5–15.5)
WBC: 5 10*3/uL (ref 4.0–10.5)

## 2012-12-03 LAB — COMPREHENSIVE METABOLIC PANEL
AST: 31 U/L (ref 0–37)
Albumin: 3.8 g/dL (ref 3.5–5.2)
Calcium: 9.2 mg/dL (ref 8.4–10.5)
Chloride: 104 mEq/L (ref 96–112)
Creatinine, Ser: 1.17 mg/dL (ref 0.50–1.35)

## 2012-12-03 LAB — SURGICAL PCR SCREEN
MRSA, PCR: NEGATIVE
Staphylococcus aureus: NEGATIVE

## 2012-12-03 LAB — PROTIME-INR: INR: 2.23 — ABNORMAL HIGH (ref 0.00–1.49)

## 2012-12-03 NOTE — Patient Instructions (Addendum)
20      Your procedure is scheduled on:  Monday 12/14/2012  Report to [place] at 1205 PM.  Call this number if you have problems the night before or morning of surgery: [phone]   Remember: TAKE 1/2 LANTUS INSULIN NIGHT BEFORE SURGERY -25 UNITS AND NO DIABETIC MEDICATIONS AM OF SURGERY!             IF YOU USE CPAP,BRING MASK AND TUBING AM OF SURGERY!   Do not eat food AFTER MIDNIGHT! MAY HAVE CLEAR LIQUIDS FROM MIDNIGHT UP UNTIL 0900 AM OF SURGERY THEN NOTHING UNTIL AFTER SURGERY!  Take these medicines the morning of surgery with A SIP OF WATER: Lyrica   Do not bring valuables to the hospital. [place].  Marland Kitchen  Leave suitcase in the car. After surgery it may be brought to your room.  For patients admitted to the hospital, checkout time is 11:00 AM the day of              Discharge.    DO NOT WEAR JEWELRY , MAKE-UP, LOTIONS,POWDERS,PERFUMES!             WOMEN -DO NOT SHAVE LEGS OR UNDERARMS 12 HRS. BEFORE SURGERY!               MEN MAY SHAVE AS USUAL!             CONTACTS,DENTURES OR BRIDGEWORK, FALSE EYELASHES MAY NOT BE WORN INTO SURGERY!                                           Patients discharged the day of surgery will not be allowed to drive home. If going home the same day of surgery, must have someone stay with you first 24 hrs.at home and arrange for someone to drive you home from the [place]: [name]-spouse   Special Instructions:             Please read over the following fact sheets that you were given:             1. [place]                   3. MRSA Instruction sheet                                   Charmaine Downs.Brayen Bunn,RN,BSN     [phone]                FAILURE TO FOLLOW THESE INSTRUCTIONS MAY RESULT IN CANCELLATION OF YOUR SURGERY!               Patient  Signature:___________________________

## 2012-12-04 NOTE — Progress Notes (Signed)
Received Fax back from Dr. Wynelle Link to draw PT/INR am of surgery. Lab requested in EPIC.

## 2012-12-13 ENCOUNTER — Other Ambulatory Visit: Payer: Self-pay | Admitting: Orthopedic Surgery

## 2012-12-13 NOTE — H&P (Signed)
Keith [name]  DOB: Aug 23, 1940 Married / Language: English / Race: White Male  Date of Admission:  12/14/2012  Chief Complaint:  Left Knee Pain  History of Present Illness The patient is a 72 year old male who comes in for a preoperative History and Physical. The patient is scheduled for a left total knee arthroplasty to be performed by Dr. Dione Plover. Aluisio, MD at [facility] on 12/14/2012. The patient is a 72 year old male who presents with knee complaints. The patient was seen for a second opinion. The patient reports left knee symptoms including: pain, instability, giving way, weakness and stiffness which began year(s) ago. Note for "Knee pain": He has been seen here in the past. He has been treated at the [place]. He has had cortisone as well as the Synvisc One. He said neither have helped very much. He was seen many years ago one time visit for his knee. Unfortunately over time he has had progressively worsening pain and dysfunction. He is being treated at the [facility]. He was told that he needed a knee replacement but they did not have funding for those surgeries. He states that the knee is getting progressively worse and is leading to him having falls. The knee will give out on him frequently. He is at a stage now where he would like to get this fixed. The right knee does not bother him. He is not having hip pain with this. He is not having lower extremity weakness or paresthesia with this. They have been treated conservatively in the past for the above stated problem and despite conservative measures, they continue to have progressive pain and severe functional limitations and dysfunction. They have failed non-operative management including home exercise, medications, and injections. It is felt that they would benefit from undergoing total joint replacement. Risks and benefits of the procedure have been discussed with the patient and they elect to proceed with surgery.  There are no active contraindications to surgery such as ongoing infection or rapidly progressive neurological disease.  Problem List Primary osteoarthritis of one knee (715.16)   Allergies Contrast Media Ready-Box *MEDICAL DEVICES* Betadine *ANTISEPTICS & DISINFECTANTS*. iodine IODINE. 03/15/2005 MOTRIN. 03/15/2005   Family History Cerebrovascular Accident. father Heart Disease. mother   Social History Current work status. retired Engineer, agricultural (Currently). no Drug/Alcohol Rehab (Previously). no Children. 0 Alcohol use. current drinker; drinks wine; less than 5 per week Exercise. Exercises rarely Pain Contract. yes Tobacco use. former smoker; smoke(d) 2 pack(s) per day Number of flights of stairs before winded. 1 Illicit drug use. no Living situation. live with spouse Marital status. married Post-Surgical Plans. Wants to look into [facility] following surgery. Advance Directives. Living Will,, Healthcare POA   Medication History Lantus SoloStar ( Subcutaneous) Specific dose unknown - Active. NovoLOG FlexPen ( Subcutaneous) Specific dose unknown - Active. Folic Acid (1MG Tablet, Oral) Active. Pantoprazole Sodium (40MG Tablet DR, Oral) Active. Furosemide (40MG Tablet, Oral) Active. Clopidogrel Bisulfate (75MG Tablet, Oral) Active. Pregabalin (200MG Capsule, Oral) Active. Simvastatin (10MG Tablet, Oral) Active. FentaNYL (100MCG/HR Patch 72HR, Transdermal) Active. Tamsulosin HCl (0.4MG Capsule, Oral) Active. Hydrocodone-Acetaminophen (10-500MG Tablet, Oral) Active. Loratadine (10MG Tablet, Oral) Active. Finasteride (5MG Tablet, Oral) Active. Spironolactone (25MG Tablet, Oral) Active. MiraLax ( Oral) Active. Warfarin Sodium (5MG Tablet, Oral) Active.   Past Surgical History Cataract Surgery. left Foot Surgery. bilateral Gallbladder Surgery. open Appendectomy Left Foot 2nd Toe Amputation Right Foot 2nd Toe and Partial 3rd Toe  Amputation Partial Liver Resection. Date: 2005.  Medical History Liver disease Osteoarthritis High blood pressure Cerebrovascular Accident Diabetes Mellitus, Type II Gastroesophageal Reflux Disease Blood Clot Cancer. B Cell Follicular Lymphoma NASH (nonalcoholic steatohepatitis) (571.8) TIA (transient ischemic attack) (435.9). 02/2006 Impaired Hearing Sleep Apnea. Patient does not use the CPAP any more. Cataract. S/P Surgery Heart murmur Hypertension History of Orthostatic Hypotension Varicose veins Deep vein thrombosis Insulin Dependent Diabetes Mellitus   Review of Systems General:Present- Fatigue. Not Present- Chills, Fever, Night Sweats, Weight Gain, Weight Loss and Memory Loss. Skin:Present- Itching and Rash (lower legs). Not Present- Hives, Eczema and Lesions. HEENT:Present- Hearing Loss. Not Present- Tinnitus, Headache, Double Vision, Visual Loss and Dentures. Respiratory:Not Present- Shortness of breath with exertion, Shortness of breath at rest, Allergies, Coughing up blood and Chronic Cough. Cardiovascular:Not Present- Chest Pain, Racing/skipping heartbeats, Difficulty Breathing Lying Down, Murmur, Swelling and Palpitations. Gastrointestinal:Not Present- Bloody Stool, Heartburn, Abdominal Pain, Vomiting, Nausea, Constipation, Diarrhea, Difficulty Swallowing, Jaundice and Loss of appetitie. Male Genitourinary:Present- Urinating at Night. Not Present- Urinary frequency, Blood in Urine, Weak urinary stream, Discharge, Flank Pain, Incontinence, Painful Urination, Urgency and Urinary Retention. Musculoskeletal:Present- Muscle Pain, Joint Swelling and Joint Pain. Not Present- Muscle Weakness, Back Pain, Morning Stiffness and Spasms. Neurological:Present- Tremor. Not Present- Dizziness, Blackout spells, Paralysis, Difficulty with balance and Weakness. Psychiatric:Not Present- Insomnia.   Vitals Pulse: 58 (Regular) Resp.: 12 (Unlabored) BP:  106/58 (Sitting, Right Arm, Standard)    Physical Exam The physical exam findings are as follows:   General Mental Status - Alert, cooperative and good historian. General Appearance- pleasant. Not in acute distress. Orientation- Oriented X3. Build & Nutrition- Well nourished and Well developed.   Head and Neck Head- normocephalic, atraumatic . Neck Global Assessment- supple. no bruit auscultated on the right and no bruit auscultated on the left.   Eye Pupil- Bilateral- Regular and Round. Motion- Bilateral- EOMI.   Chest and Lung Exam Auscultation: Breath sounds:- clear at anterior chest wall and - clear at posterior chest wall. Adventitious sounds:- No Adventitious sounds.   Cardiovascular Auscultation:Rhythm- Regular rate and rhythm. Heart Sounds- S1 WNL and S2 WNL. Murmurs & Other Heart Sounds:Auscultation of the heart reveals - No Murmurs.   Abdomen Palpation/Percussion:Tenderness- Abdomen is non-tender to palpation. Rigidity (guarding)- Abdomen is soft. Auscultation:Auscultation of the abdomen reveals - Bowel sounds normal.   Male Genitourinary  Not done, not pertinent to present illness  Musculoskeletal On exam well developed male alert and oriented in no apparent distress. Evaluation of his hips show normal range of motion with no discomfort. The right knee shows no effusion. Range about 0 to 135. Slight crepitus on range of motion with no jointline tenderness or instability. Left knee no effusion. Varus deformity. Range 5 to 120. Marked crepitus on range of motion. Tenderness medial greater than lateral with no instability noted. He does have 1+ peripheral edema in both lower legs.  RADIOGRAPHS: AP both knees and lateral show that he has severe bone on bone arthritis in the medial and patellofemoral compartments of the left knee.  Assessment & Plan Primary osteoarthritis of one knee (715.16) Impression: Left Knee  Note: Plan  is for a Left Total Knee Replacement by Dr. Wynelle Link.  Plan is to go to rehab. He wants to look into [facility].  PCP - Dr. Osborne Casco  The patient will not receive TXA (tranexamic acid) due to: History of Blood Clots, Cancer, CVA/TIA  Time spent ~ 40 minutes  Signed electronically by Joelene Millin, III PA-C

## 2012-12-14 ENCOUNTER — Encounter (HOSPITAL_COMMUNITY): Payer: Self-pay | Admitting: Anesthesiology

## 2012-12-14 ENCOUNTER — Encounter (HOSPITAL_COMMUNITY): Payer: Self-pay | Admitting: *Deleted

## 2012-12-14 ENCOUNTER — Inpatient Hospital Stay (HOSPITAL_COMMUNITY)
Admission: RE | Admit: 2012-12-14 | Discharge: 2012-12-17 | DRG: 470 | Disposition: A | Discharge status: Skilled Nursing Facility | Payer: Medicare Other | Source: Ambulatory Visit | Attending: Orthopedic Surgery | Admitting: Orthopedic Surgery

## 2012-12-14 ENCOUNTER — Inpatient Hospital Stay (HOSPITAL_COMMUNITY): Payer: Medicare Other | Admitting: Anesthesiology

## 2012-12-14 ENCOUNTER — Encounter (HOSPITAL_COMMUNITY)
Admission: RE | Disposition: A | Discharge status: Skilled Nursing Facility | Payer: Self-pay | Source: Ambulatory Visit | Attending: Orthopedic Surgery

## 2012-12-14 DIAGNOSIS — Z96652 Presence of left artificial knee joint: Secondary | ICD-10-CM

## 2012-12-14 DIAGNOSIS — Z794 Long term (current) use of insulin: Secondary | ICD-10-CM

## 2012-12-14 DIAGNOSIS — M179 Osteoarthritis of knee, unspecified: Secondary | ICD-10-CM | POA: Diagnosis present

## 2012-12-14 DIAGNOSIS — K219 Gastro-esophageal reflux disease without esophagitis: Secondary | ICD-10-CM

## 2012-12-14 DIAGNOSIS — Z6841 Body Mass Index (BMI) 40.0 and over, adult: Secondary | ICD-10-CM

## 2012-12-14 DIAGNOSIS — H9190 Unspecified hearing loss, unspecified ear: Secondary | ICD-10-CM | POA: Diagnosis present

## 2012-12-14 DIAGNOSIS — E119 Type 2 diabetes mellitus without complications: Secondary | ICD-10-CM | POA: Diagnosis present

## 2012-12-14 DIAGNOSIS — D62 Acute posthemorrhagic anemia: Secondary | ICD-10-CM | POA: Diagnosis not present

## 2012-12-14 DIAGNOSIS — I10 Essential (primary) hypertension: Secondary | ICD-10-CM | POA: Diagnosis present

## 2012-12-14 DIAGNOSIS — G4730 Sleep apnea, unspecified: Secondary | ICD-10-CM | POA: Diagnosis present

## 2012-12-14 DIAGNOSIS — Z8673 Personal history of transient ischemic attack (TIA), and cerebral infarction without residual deficits: Secondary | ICD-10-CM

## 2012-12-14 DIAGNOSIS — M1710 Unilateral primary osteoarthritis, unspecified knee: Principal | ICD-10-CM | POA: Diagnosis present

## 2012-12-14 DIAGNOSIS — E871 Hypo-osmolality and hyponatremia: Secondary | ICD-10-CM

## 2012-12-14 DIAGNOSIS — D696 Thrombocytopenia, unspecified: Secondary | ICD-10-CM

## 2012-12-14 HISTORY — PX: TOTAL KNEE ARTHROPLASTY: SHX125

## 2012-12-14 LAB — GLUCOSE, CAPILLARY
Glucose-Capillary: 90 mg/dL (ref 70–99)
Glucose-Capillary: 161 mg/dL — ABNORMAL HIGH (ref 70–99)

## 2012-12-14 LAB — TYPE AND SCREEN: ABO/RH(D): O NEG

## 2012-12-14 LAB — PROTIME-INR
INR: 1.1 (ref 0.00–1.49)
Prothrombin Time: 14 seconds (ref 11.6–15.2)

## 2012-12-14 SURGERY — ARTHROPLASTY, KNEE, TOTAL
Anesthesia: Spinal | Site: Knee | Laterality: Left | Wound class: Clean

## 2012-12-14 MED ORDER — KETAMINE HCL 50 MG/ML IJ SOLN
INTRAMUSCULAR | Status: DC | PRN
  Administered 2012-12-14 (×5): 10 mg via INTRAMUSCULAR

## 2012-12-14 MED ORDER — TRAMADOL HCL 50 MG PO TABS: 50.0000 mg | ORAL_TABLET | Freq: Four times a day (QID) | ORAL | Status: DC | PRN

## 2012-12-14 MED ORDER — FINASTERIDE 5 MG PO TABS
5.0000 mg | ORAL_TABLET | Freq: Every day | ORAL | Status: DC
  Administered 2012-12-15 – 2012-12-17 (×3): 5 mg via ORAL
  Filled 2012-12-14 (×3): qty 1

## 2012-12-14 MED ORDER — DOCUSATE SODIUM 100 MG PO CAPS
100.0000 mg | ORAL_CAPSULE | Freq: Two times a day (BID) | ORAL | Status: DC
  Administered 2012-12-15 – 2012-12-17 (×5): 100 mg via ORAL

## 2012-12-14 MED ORDER — DEXTROSE 5 % IV SOLN
3.0000 g | INTRAVENOUS | Status: AC
  Administered 2012-12-14: 3 g via INTRAVENOUS
  Filled 2012-12-14: qty 3000

## 2012-12-14 MED ORDER — BUPIVACAINE HCL (PF) 0.25 % IJ SOLN
INTRAMUSCULAR | Status: AC
  Filled 2012-12-14: qty 30

## 2012-12-14 MED ORDER — CEFAZOLIN SODIUM-DEXTROSE 2-3 GM-% IV SOLR
2.0000 g | Freq: Four times a day (QID) | INTRAVENOUS | Status: AC
  Administered 2012-12-14 – 2012-12-15 (×2): 2 g via INTRAVENOUS
  Filled 2012-12-14 (×2): qty 50

## 2012-12-14 MED ORDER — METOCLOPRAMIDE HCL 10 MG PO TABS: 5.0000 mg | ORAL_TABLET | Freq: Three times a day (TID) | ORAL | Status: DC | PRN

## 2012-12-14 MED ORDER — DIPHENHYDRAMINE HCL 12.5 MG/5ML PO ELIX: 12.5000 mg | ORAL_SOLUTION | ORAL | Status: DC | PRN

## 2012-12-14 MED ORDER — CEFAZOLIN SODIUM 1-5 GM-% IV SOLN
INTRAVENOUS | Status: AC
  Filled 2012-12-14: qty 50

## 2012-12-14 MED ORDER — FLEET ENEMA 7-19 GM/118ML RE ENEM: 1.0000 | ENEMA | Freq: Once | RECTAL | Status: AC | PRN

## 2012-12-14 MED ORDER — METHOCARBAMOL 500 MG PO TABS
500.0000 mg | ORAL_TABLET | Freq: Four times a day (QID) | ORAL | Status: DC | PRN
  Administered 2012-12-15 – 2012-12-17 (×7): 500 mg via ORAL
  Filled 2012-12-14 (×7): qty 1

## 2012-12-14 MED ORDER — ONDANSETRON HCL 4 MG/2ML IJ SOLN: 4.0000 mg | Freq: Four times a day (QID) | INTRAMUSCULAR | Status: DC | PRN

## 2012-12-14 MED ORDER — PHENOL 1.4 % MT LIQD: 1.0000 | OROMUCOSAL | Status: DC | PRN

## 2012-12-14 MED ORDER — BUPIVACAINE HCL 0.25 % IJ SOLN
INTRAMUSCULAR | Status: DC | PRN
  Administered 2012-12-14: 30 mL

## 2012-12-14 MED ORDER — METHOCARBAMOL 100 MG/ML IJ SOLN
500.0000 mg | Freq: Four times a day (QID) | INTRAVENOUS | Status: DC | PRN
  Administered 2012-12-14 – 2012-12-15 (×2): 500 mg via INTRAVENOUS
  Filled 2012-12-14 (×3): qty 5

## 2012-12-14 MED ORDER — ENOXAPARIN SODIUM 30 MG/0.3ML ~~LOC~~ SOLN
30.0000 mg | Freq: Two times a day (BID) | SUBCUTANEOUS | Status: DC
  Administered 2012-12-15 – 2012-12-16 (×3): 30 mg via SUBCUTANEOUS
  Filled 2012-12-14 (×5): qty 0.3

## 2012-12-14 MED ORDER — SODIUM CHLORIDE 0.9 % IJ SOLN
INTRAMUSCULAR | Status: DC | PRN
  Administered 2012-12-14: 50 mL via INTRAVENOUS

## 2012-12-14 MED ORDER — DEXTROSE 5 % IV SOLN
INTRAVENOUS | Status: DC | PRN
  Administered 2012-12-14: 15:00:00 via INTRAVENOUS

## 2012-12-14 MED ORDER — DEXAMETHASONE SODIUM PHOSPHATE 10 MG/ML IJ SOLN
10.0000 mg | Freq: Once | INTRAMUSCULAR | Status: DC
Start: 2012-12-14 — End: 2012-12-14

## 2012-12-14 MED ORDER — HYDROMORPHONE HCL PF 1 MG/ML IJ SOLN
0.5000 mg | INTRAMUSCULAR | Status: DC | PRN
  Administered 2012-12-14 (×2): 1 mg via INTRAVENOUS
  Administered 2012-12-15: 2 mg via INTRAVENOUS
  Administered 2012-12-15: 1 mg via INTRAVENOUS
  Administered 2012-12-15: 2 mg via INTRAVENOUS
  Filled 2012-12-14: qty 2
  Filled 2012-12-14 (×2): qty 1
  Filled 2012-12-14: qty 2
  Filled 2012-12-14: qty 1

## 2012-12-14 MED ORDER — LACTATED RINGERS IV SOLN: INTRAVENOUS | Status: DC

## 2012-12-14 MED ORDER — LORATADINE 10 MG PO TABS
10.0000 mg | ORAL_TABLET | Freq: Every day | ORAL | Status: DC | PRN
  Filled 2012-12-14: qty 1

## 2012-12-14 MED ORDER — PATIENT'S GUIDE TO USING COUMADIN BOOK
Freq: Once | Status: AC
  Administered 2012-12-15: 1
  Filled 2012-12-14: qty 1

## 2012-12-14 MED ORDER — BUPIVACAINE LIPOSOME 1.3 % IJ SUSP
INTRAMUSCULAR | Status: DC | PRN
  Administered 2012-12-14: 20 mL

## 2012-12-14 MED ORDER — POLYETHYLENE GLYCOL 3350 17 G PO PACK: 17.0000 g | PACK | Freq: Every day | ORAL | Status: DC | PRN

## 2012-12-14 MED ORDER — PROPOFOL INFUSION 10 MG/ML OPTIME
INTRAVENOUS | Status: DC | PRN
  Administered 2012-12-14: 50 ug/kg/min via INTRAVENOUS

## 2012-12-14 MED ORDER — OXYCODONE HCL 5 MG PO TABS
5.0000 mg | ORAL_TABLET | ORAL | Status: DC | PRN
  Administered 2012-12-14: 15 mg via ORAL
  Administered 2012-12-15: 10 mg via ORAL
  Administered 2012-12-15 (×2): 15 mg via ORAL
  Administered 2012-12-15: 10 mg via ORAL
  Administered 2012-12-15: 5 mg via ORAL
  Administered 2012-12-16: 10 mg via ORAL
  Administered 2012-12-16: 15 mg via ORAL
  Administered 2012-12-16 (×2): 10 mg via ORAL
  Administered 2012-12-17 (×2): 15 mg via ORAL
  Administered 2012-12-17: 10 mg via ORAL
  Filled 2012-12-14 (×2): qty 3
  Filled 2012-12-14 (×3): qty 2
  Filled 2012-12-14: qty 1
  Filled 2012-12-14 (×2): qty 2
  Filled 2012-12-14 (×2): qty 3
  Filled 2012-12-14: qty 2
  Filled 2012-12-14: qty 3
  Filled 2012-12-14 (×3): qty 2

## 2012-12-14 MED ORDER — HYDROMORPHONE HCL PF 1 MG/ML IJ SOLN
0.2500 mg | INTRAMUSCULAR | Status: DC | PRN
  Administered 2012-12-14 (×2): 0.5 mg via INTRAVENOUS

## 2012-12-14 MED ORDER — HYDROMORPHONE HCL PF 1 MG/ML IJ SOLN
INTRAMUSCULAR | Status: AC
  Filled 2012-12-14: qty 1

## 2012-12-14 MED ORDER — MIDAZOLAM HCL 5 MG/5ML IJ SOLN
INTRAMUSCULAR | Status: DC | PRN
  Administered 2012-12-14 (×2): 1 mg via INTRAVENOUS

## 2012-12-14 MED ORDER — MORPHINE SULFATE 2 MG/ML IJ SOLN
1.0000 mg | INTRAMUSCULAR | Status: DC | PRN
  Administered 2012-12-14 (×2): 2 mg via INTRAVENOUS
  Filled 2012-12-14 (×2): qty 1

## 2012-12-14 MED ORDER — SPIRONOLACTONE 100 MG PO TABS
100.0000 mg | ORAL_TABLET | Freq: Every morning | ORAL | Status: DC
  Administered 2012-12-15 – 2012-12-17 (×3): 100 mg via ORAL
  Filled 2012-12-14 (×3): qty 1

## 2012-12-14 MED ORDER — GLYCOPYRROLATE 0.2 MG/ML IJ SOLN
INTRAMUSCULAR | Status: DC | PRN
  Administered 2012-12-14: 0.2 mg via INTRAVENOUS

## 2012-12-14 MED ORDER — SODIUM CHLORIDE 0.9 % IR SOLN
Status: DC | PRN
  Administered 2012-12-14: 1000 mL

## 2012-12-14 MED ORDER — ONDANSETRON HCL 4 MG PO TABS: 4.0000 mg | ORAL_TABLET | Freq: Four times a day (QID) | ORAL | Status: DC | PRN

## 2012-12-14 MED ORDER — PREGABALIN 75 MG PO CAPS: 150.0000 mg | ORAL_CAPSULE | Freq: Two times a day (BID) | ORAL | Status: DC

## 2012-12-14 MED ORDER — 0.9 % SODIUM CHLORIDE (POUR BTL) OPTIME
TOPICAL | Status: DC | PRN
  Administered 2012-12-14: 1000 mL

## 2012-12-14 MED ORDER — PANTOPRAZOLE SODIUM 40 MG PO TBEC
40.0000 mg | DELAYED_RELEASE_TABLET | Freq: Every day | ORAL | Status: DC
  Administered 2012-12-15 – 2012-12-17 (×3): 40 mg via ORAL
  Filled 2012-12-14 (×3): qty 1

## 2012-12-14 MED ORDER — WARFARIN SODIUM 10 MG PO TABS
10.0000 mg | ORAL_TABLET | Freq: Once | ORAL | Status: AC
  Administered 2012-12-14: 10 mg via ORAL
  Filled 2012-12-14: qty 1

## 2012-12-14 MED ORDER — WARFARIN VIDEO
Freq: Once | Status: AC
  Administered 2012-12-15: 10:00:00

## 2012-12-14 MED ORDER — OXYCODONE HCL 5 MG PO TABS
5.0000 mg | ORAL_TABLET | ORAL | Status: DC | PRN
  Administered 2012-12-14: 10 mg via ORAL
  Filled 2012-12-14: qty 2

## 2012-12-14 MED ORDER — FUROSEMIDE 40 MG PO TABS
40.0000 mg | ORAL_TABLET | Freq: Every morning | ORAL | Status: DC
  Administered 2012-12-15 – 2012-12-17 (×3): 40 mg via ORAL
  Filled 2012-12-14 (×3): qty 1

## 2012-12-14 MED ORDER — ACETAMINOPHEN 500 MG PO TABS
1000.0000 mg | ORAL_TABLET | Freq: Once | ORAL | Status: DC
  Filled 2012-12-14: qty 2

## 2012-12-14 MED ORDER — HYDROCERIN EX CREA
1.0000 "application " | TOPICAL_CREAM | Freq: Every day | CUTANEOUS | Status: DC
  Administered 2012-12-14 – 2012-12-15 (×2): 1 via TOPICAL
  Filled 2012-12-14: qty 113

## 2012-12-14 MED ORDER — INSULIN GLARGINE 100 UNIT/ML ~~LOC~~ SOLN
25.0000 [IU] | Freq: Every day | SUBCUTANEOUS | Status: DC
  Administered 2012-12-16: 25 [IU] via SUBCUTANEOUS
  Filled 2012-12-14 (×2): qty 0.25

## 2012-12-14 MED ORDER — FENTANYL 100 MCG/HR TD PT72
100.0000 ug | MEDICATED_PATCH | TRANSDERMAL | Status: DC
  Administered 2012-12-14: 100 ug via TRANSDERMAL
  Filled 2012-12-14: qty 1

## 2012-12-14 MED ORDER — INSULIN ASPART 100 UNIT/ML ~~LOC~~ SOLN
0.0000 [IU] | Freq: Three times a day (TID) | SUBCUTANEOUS | Status: DC
  Administered 2012-12-15: 5 [IU] via SUBCUTANEOUS
  Administered 2012-12-15: 15 [IU] via SUBCUTANEOUS
  Administered 2012-12-15: 3 [IU] via SUBCUTANEOUS
  Administered 2012-12-16 (×2): 8 [IU] via SUBCUTANEOUS
  Administered 2012-12-16 – 2012-12-17 (×2): 5 [IU] via SUBCUTANEOUS
  Administered 2012-12-17: 3 [IU] via SUBCUTANEOUS

## 2012-12-14 MED ORDER — PREGABALIN 75 MG PO CAPS
150.0000 mg | ORAL_CAPSULE | Freq: Two times a day (BID) | ORAL | Status: DC
  Administered 2012-12-14 – 2012-12-17 (×6): 150 mg via ORAL
  Filled 2012-12-14 (×7): qty 2

## 2012-12-14 MED ORDER — BISACODYL 10 MG RE SUPP: 10.0000 mg | Freq: Every day | RECTAL | Status: DC | PRN

## 2012-12-14 MED ORDER — EPHEDRINE SULFATE 50 MG/ML IJ SOLN
INTRAMUSCULAR | Status: DC | PRN
  Administered 2012-12-14 (×2): 5 mg via INTRAVENOUS

## 2012-12-14 MED ORDER — SODIUM CHLORIDE 0.9 % IV SOLN
INTRAVENOUS | Status: DC
  Administered 2012-12-14: 1000 mL via INTRAVENOUS
  Administered 2012-12-15 (×2): via INTRAVENOUS

## 2012-12-14 MED ORDER — CEFAZOLIN SODIUM-DEXTROSE 2-3 GM-% IV SOLR
INTRAVENOUS | Status: AC
  Filled 2012-12-14: qty 50

## 2012-12-14 MED ORDER — SODIUM CHLORIDE 0.9 % IJ SOLN
INTRAMUSCULAR | Status: AC
  Filled 2012-12-14: qty 50

## 2012-12-14 MED ORDER — WARFARIN - PHARMACIST DOSING INPATIENT: Freq: Every day | Status: DC

## 2012-12-14 MED ORDER — SIMVASTATIN 10 MG PO TABS
10.0000 mg | ORAL_TABLET | Freq: Every evening | ORAL | Status: DC
  Administered 2012-12-14 – 2012-12-16 (×3): 10 mg via ORAL
  Filled 2012-12-14 (×4): qty 1

## 2012-12-14 MED ORDER — STERILE WATER FOR IRRIGATION IR SOLN
Status: DC | PRN
  Administered 2012-12-14: 1500 mL

## 2012-12-14 MED ORDER — METOCLOPRAMIDE HCL 5 MG/ML IJ SOLN: 5.0000 mg | Freq: Three times a day (TID) | INTRAMUSCULAR | Status: DC | PRN

## 2012-12-14 MED ORDER — MEPERIDINE HCL 50 MG/ML IJ SOLN: 6.2500 mg | INTRAMUSCULAR | Status: DC | PRN

## 2012-12-14 MED ORDER — MENTHOL 3 MG MT LOZG: 1.0000 | LOZENGE | OROMUCOSAL | Status: DC | PRN

## 2012-12-14 MED ORDER — BUPIVACAINE IN DEXTROSE 0.75-8.25 % IT SOLN
INTRATHECAL | Status: DC | PRN
  Administered 2012-12-14: 1.6 mL via INTRATHECAL

## 2012-12-14 MED ORDER — BUPIVACAINE LIPOSOME 1.3 % IJ SUSP
20.0000 mL | Freq: Once | INTRAMUSCULAR | Status: DC
  Filled 2012-12-14: qty 20

## 2012-12-14 MED ORDER — PROMETHAZINE HCL 25 MG/ML IJ SOLN: 6.2500 mg | INTRAMUSCULAR | Status: DC | PRN

## 2012-12-14 MED ORDER — ONDANSETRON HCL 4 MG/2ML IJ SOLN
INTRAMUSCULAR | Status: DC | PRN
  Administered 2012-12-14: 4 mg via INTRAVENOUS

## 2012-12-14 MED ORDER — POLYVINYL ALCOHOL 1.4 % OP SOLN
1.0000 [drp] | Freq: Two times a day (BID) | OPHTHALMIC | Status: DC | PRN
  Filled 2012-12-14: qty 15

## 2012-12-14 MED ORDER — SODIUM CHLORIDE 0.9 % IV SOLN: INTRAVENOUS | Status: DC

## 2012-12-14 MED ORDER — TAMSULOSIN HCL 0.4 MG PO CAPS
0.4000 mg | ORAL_CAPSULE | Freq: Every day | ORAL | Status: DC
  Administered 2012-12-14 – 2012-12-16 (×3): 0.4 mg via ORAL
  Filled 2012-12-14 (×4): qty 1

## 2012-12-14 MED ORDER — LACTATED RINGERS IV SOLN
INTRAVENOUS | Status: DC | PRN
  Administered 2012-12-14 (×2): via INTRAVENOUS

## 2012-12-14 SURGICAL SUPPLY — 55 items
BAG ZIPLOCK 12X15 (MISCELLANEOUS) ×2 IMPLANT
BANDAGE ELASTIC 6 VELCRO ST LF (GAUZE/BANDAGES/DRESSINGS) ×2 IMPLANT
BANDAGE ESMARK 6X9 LF (GAUZE/BANDAGES/DRESSINGS) ×1 IMPLANT
BLADE SAG 18X100X1.27 (BLADE) ×2 IMPLANT
BLADE SAW SGTL 11.0X1.19X90.0M (BLADE) ×2 IMPLANT
BNDG ESMARK 6X9 LF (GAUZE/BANDAGES/DRESSINGS) ×2
BOWL SMART MIX CTS (DISPOSABLE) ×2 IMPLANT
CAPT RP KNEE ×2 IMPLANT
CEMENT HV SMART SET (Cement) ×4 IMPLANT
CLOTH BEACON ORANGE TIMEOUT ST (SAFETY) ×2 IMPLANT
CUFF TOURN SGL QUICK 34 (TOURNIQUET CUFF) ×1
CUFF TRNQT CYL 34X4X40X1 (TOURNIQUET CUFF) ×1 IMPLANT
DECANTER SPIKE VIAL GLASS SM (MISCELLANEOUS) ×2 IMPLANT
DRAPE EXTREMITY T 121X128X90 (DRAPE) ×2 IMPLANT
DRAPE POUCH INSTRU U-SHP 10X18 (DRAPES) ×2 IMPLANT
DRAPE U-SHAPE 47X51 STRL (DRAPES) ×2 IMPLANT
DRSG ADAPTIC 3X8 NADH LF (GAUZE/BANDAGES/DRESSINGS) ×2 IMPLANT
DRSG PAD ABDOMINAL 8X10 ST (GAUZE/BANDAGES/DRESSINGS) ×2 IMPLANT
DURAPREP 26ML APPLICATOR (WOUND CARE) ×2 IMPLANT
ELECT REM PT RETURN 9FT ADLT (ELECTROSURGICAL) ×2
ELECTRODE REM PT RTRN 9FT ADLT (ELECTROSURGICAL) ×1 IMPLANT
EVACUATOR 1/8 PVC DRAIN (DRAIN) ×2 IMPLANT
FACESHIELD LNG OPTICON STERILE (SAFETY) ×10 IMPLANT
GLOVE BIO SURGEON STRL SZ7.5 (GLOVE) ×2 IMPLANT
GLOVE BIO SURGEON STRL SZ8 (GLOVE) ×2 IMPLANT
GLOVE BIOGEL PI IND STRL 8 (GLOVE) ×2 IMPLANT
GLOVE BIOGEL PI INDICATOR 8 (GLOVE) ×2
GLOVE SURG SS PI 6.5 STRL IVOR (GLOVE) ×2 IMPLANT
GOWN STRL NON-REIN LRG LVL3 (GOWN DISPOSABLE) ×2 IMPLANT
GOWN STRL REIN XL XLG (GOWN DISPOSABLE) ×8 IMPLANT
HANDPIECE INTERPULSE COAX TIP (DISPOSABLE) ×1
IMMOBILIZER KNEE 20 (SOFTGOODS) ×2
IMMOBILIZER KNEE 20 THIGH 36 (SOFTGOODS) ×1 IMPLANT
KIT BASIN OR (CUSTOM PROCEDURE TRAY) ×2 IMPLANT
MANIFOLD NEPTUNE II (INSTRUMENTS) ×2 IMPLANT
NDL SAFETY ECLIPSE 18X1.5 (NEEDLE) ×2 IMPLANT
NEEDLE HYPO 18GX1.5 SHARP (NEEDLE) ×2
NS IRRIG 1000ML POUR BTL (IV SOLUTION) ×2 IMPLANT
PACK TOTAL JOINT (CUSTOM PROCEDURE TRAY) ×2 IMPLANT
PADDING CAST COTTON 6X4 STRL (CAST SUPPLIES) ×4 IMPLANT
POSITIONER SURGICAL ARM (MISCELLANEOUS) ×2 IMPLANT
SET HNDPC FAN SPRY TIP SCT (DISPOSABLE) ×1 IMPLANT
SPONGE GAUZE 4X4 12PLY (GAUZE/BANDAGES/DRESSINGS) ×2 IMPLANT
STRIP CLOSURE SKIN 1/2X4 (GAUZE/BANDAGES/DRESSINGS) ×4 IMPLANT
SUCTION FRAZIER 12FR DISP (SUCTIONS) ×2 IMPLANT
SUT MNCRL AB 4-0 PS2 18 (SUTURE) ×2 IMPLANT
SUT VIC AB 2-0 CT1 27 (SUTURE) ×3
SUT VIC AB 2-0 CT1 TAPERPNT 27 (SUTURE) ×3 IMPLANT
SUT VLOC 180 0 24IN GS25 (SUTURE) ×2 IMPLANT
SYR 20CC LL (SYRINGE) ×2 IMPLANT
SYR 50ML LL SCALE MARK (SYRINGE) ×2 IMPLANT
TOWEL OR 17X26 10 PK STRL BLUE (TOWEL DISPOSABLE) ×4 IMPLANT
TRAY FOLEY CATH 14FRSI W/METER (CATHETERS) ×2 IMPLANT
WATER STERILE IRR 1500ML POUR (IV SOLUTION) ×2 IMPLANT
WRAP KNEE MAXI GEL POST OP (GAUZE/BANDAGES/DRESSINGS) ×2 IMPLANT

## 2012-12-14 NOTE — Anesthesia Preprocedure Evaluation (Addendum)
Anesthesia Evaluation  Patient identified by MRN, date of birth, ID band Patient awake    Reviewed: Allergy & Precautions, H&P , NPO status , Patient's Chart, lab work & pertinent test results  Airway Mallampati: III TM Distance: >3 FB Neck ROM: Full    Dental no notable dental hx. (+) Edentulous Upper and Edentulous Lower   Pulmonary neg pulmonary ROS, sleep apnea , former smoker,  breath sounds clear to auscultation  Pulmonary exam normal       Cardiovascular hypertension, Pt. on medications DVT negative cardio ROS  Rhythm:Regular Rate:Normal     Neuro/Psych TIAnegative neurological ROS  negative psych ROS   GI/Hepatic negative GI ROS, Neg liver ROS,   Endo/Other  negative endocrine ROSdiabetes, Insulin DependentMorbid obesity  Renal/GU negative Renal ROS  negative genitourinary   Musculoskeletal negative musculoskeletal ROS (+)   Abdominal   Peds negative pediatric ROS (+)  Hematology negative hematology ROS (+)   Anesthesia Other Findings   Reproductive/Obstetrics negative OB ROS                          Anesthesia Physical Anesthesia Plan  ASA: III  Anesthesia Plan: Spinal   Post-op Pain Management:    Induction:   Airway Management Planned: Simple Face Mask  Additional Equipment:   Intra-op Plan:   Post-operative Plan:   Informed Consent: I have reviewed the patients History and Physical, chart, labs and discussed the procedure including the risks, benefits and alternatives for the proposed anesthesia with the patient or authorized representative who has indicated his/her understanding and acceptance.   Dental advisory given  Plan Discussed with: CRNA  Anesthesia Plan Comments:         Anesthesia Quick Evaluation

## 2012-12-14 NOTE — Anesthesia Postprocedure Evaluation (Signed)
  Anesthesia Post-op Note  Patient: Keith [name]  Procedure(s) Performed: Procedure(s) (LRB): LEFT TOTAL KNEE ARTHROPLASTY (Left)  Patient Location: PACU  Anesthesia Type: Spinal  Level of Consciousness: awake and alert   Airway and Oxygen Therapy: Patient Spontanous Breathing  Post-op Pain: mild  Post-op Assessment: Post-op Vital signs reviewed, Patient's Cardiovascular Status Stable, Respiratory Function Stable, Patent Airway and No signs of Nausea or vomiting  Last Vitals:  Filed Vitals:   12/14/12 1730  BP: 121/80  Pulse: 75  Temp:   Resp: 12    Post-op Vital Signs: stable   Complications: No apparent anesthesia complications

## 2012-12-14 NOTE — Progress Notes (Signed)
Patient refused to take the PO Tylenol prior to OR. He had a liver resection years ago and was told never to take Tylenol. Message left on front of chart for Dr. Wynelle Link

## 2012-12-14 NOTE — Op Note (Signed)
Pre-operative diagnosis- Osteoarthritis  Left knee(s)  Post-operative diagnosis- Osteoarthritis Left knee(s)  Procedure-  Left  Total Knee Arthroplasty  Surgeon- Dione Plover. Korin Setzler, MD  Assistant- Arlee Muslim, PA-C   Anesthesia-  Spinal EBL-* No blood loss amount entered *  Drains Hemovac  Tourniquet time-  Total Tourniquet Time Documented: Thigh (Left) - 42 minutes Total: Thigh (Left) - 42 minutes    Complications- None  Condition-PACU - hemodynamically stable.   Brief Clinical Note   Keith [name] is a 72 y.o. year old male with end stage OA of his left knee with progressively worsening pain and dysfunction. He has constant pain, with activity and at rest and significant functional deficits with difficulties even with ADLs. He has had extensive non-op management including analgesics, injections of cortisone and viscosupplements, and home exercise program, but remains in significant pain with significant dysfunction. Radiographs show bone on bone arthritis medial and patellofemoral. He presents now for left Total Knee Arthroplasty.     Procedure in detail---   The patient is brought into the operating room and positioned supine on the operating table. After successful administration of  Spinal,   a tourniquet is placed high on the  Left thigh(s) and the lower extremity is prepped and draped in the usual sterile fashion. Time out is performed by the operating team and then the  Left lower extremity is wrapped in Esmarch, knee flexed and the tourniquet inflated to 300 mmHg.       A midline incision is made with a ten blade through the subcutaneous tissue to the level of the extensor mechanism. A fresh blade is used to make a medial parapatellar arthrotomy. Soft tissue over the proximal medial tibia is subperiosteally elevated to the joint line with a knife and into the semimembranosus bursa with a Cobb elevator. Soft tissue over the proximal lateral tibia is elevated with attention  being paid to avoiding the patellar tendon on the tibial tubercle. The patella is everted, knee flexed 90 degrees and the ACL and PCL are removed. Findings are bone on bone medial and patellofemoral with massive global osteophytes.        The drill is used to create a starting hole in the distal femur and the canal is thoroughly irrigated with sterile saline to remove the fatty contents. The 5 degree Left  valgus alignment guide is placed into the femoral canal and the distal femoral cutting block is pinned to remove 10 mm off the distal femur. Resection is made with an oscillating saw.      The tibia is subluxed forward and the menisci are removed. The extramedullary alignment guide is placed referencing proximally at the medial aspect of the tibial tubercle and distally along the second metatarsal axis and tibial crest. The block is pinned to remove 62m off the more deficient medial  side. Resection is made with an oscillating saw. Size 4is the most appropriate size for the tibia and the proximal tibia is prepared with the modular drill and keel punch for that size.      The femoral sizing guide is placed and size 4 is most appropriate. Rotation is marked off the epicondylar axis and confirmed by creating a rectangular flexion gap at 90 degrees. The size 4 cutting block is pinned in this rotation and the anterior, posterior and chamfer cuts are made with the oscillating saw. The intercondylar block is then placed and that cut is made.      Trial size 4 tibial component, trial  size 4 posterior stabilized femur and a 12.5  mm posterior stabilized rotating platform insert trial is placed. Full extension is achieved with excellent varus/valgus and anterior/posterior balance throughout full range of motion. The patella is everted and thickness measured to be 27  mm. Free hand resection is taken to 15 mm, a 38 template is placed, lug holes are drilled, trial patella is placed, and it tracks normally. Osteophytes are  removed off the posterior femur with the trial in place. All trials are removed and the cut bone surfaces prepared with pulsatile lavage. Cement is mixed and once ready for implantation, the size 4 tibial implant, size  4 posterior stabilized femoral component, and the size 38 patella are cemented in place and the patella is held with the clamp. The trial insert is placed and the knee held in full extension. The Exparel (20 ml mixed with 30 ml saline) and .25% Bupivicaine, are injected into the extensor mechanism, posterior capsule, medial and lateral gutters and subcutaneous tissues.  All extruded cement is removed and once the cement is hard the permanent 12.5 mm posterior stabilized rotating platform insert is placed into the tibial tray.      The wound is copiously irrigated with saline solution and the extensor mechanism closed over a hemovac drain with #1 PDS suture. The tourniquet is released for a total tourniquet time of 42  minutes. Flexion against gravity is 140 degrees and the patella tracks normally. Subcutaneous tissue is closed with 2.0 vicryl and subcuticular with running 4.0 Monocryl. The incision is cleaned and dried and steri-strips and a bulky sterile dressing are applied. The limb is placed into a knee immobilizer and the patient is awakened and transported to recovery in stable condition.      Please note that a surgical assistant was a medical necessity for this procedure in order to perform it in a safe and expeditious manner. Surgical assistant was necessary to retract the ligaments and vital neurovascular structures to prevent injury to them and also necessary for proper positioning of the limb to allow for anatomic placement of the prosthesis.   Dione Plover Bronc Brosseau, MD    12/14/2012, 4:41 PM

## 2012-12-14 NOTE — Preoperative (Signed)
Beta Blockers   Reason not to administer Beta Blockers:Not Applicable 

## 2012-12-14 NOTE — Interval H&P Note (Signed)
History and Physical Interval Note:  12/14/2012 2:27 PM  Keith [name]  has presented today for surgery, with the diagnosis of OA OF LEFT KNEE  The various methods of treatment have been discussed with the patient and family. After consideration of risks, benefits and other options for treatment, the patient has consented to  Procedure(s): LEFT TOTAL KNEE ARTHROPLASTY (Left) as a surgical intervention .  [name] history has been reviewed, patient examined, no change in status, stable for surgery.  I have reviewed the patient's chart and labs.  Questions were answered to the patient's satisfaction.     Gearlean Alf

## 2012-12-14 NOTE — Progress Notes (Addendum)
ANTICOAGULATION CONSULT NOTE - Initial Consult  Pharmacy Consult for Warfarin Indication: VTE prophylaxis  Allergies  Allergen Reactions  . Iodine Rash    Patient Measurements: Height: 5' 9.69" (177 cm) (Documented 12/03/12) IBW/kg (Calculated) : 72.28 Heparin Dosing Weight:   Vital Signs: Temp: 95.9 F (35.5 C) (09/08 1845) BP: 120/77 mmHg (09/08 1845) Pulse Rate: 64 (09/08 1845)  Labs:  Recent Labs  12/14/12 1240  LABPROT 14.0  INR 1.10    The CrCl is unknown because both a height and weight (above a minimum accepted value) are required for this calculation.   Medical History: Past Medical History  Diagnosis Date  . Diabetes mellitus   . Hypertension   . Stroke   . Prostate hypertrophy   . Cirrhosis of liver   . Hepatic cancer     Medications:  Scheduled:  .  ceFAZolin (ANCEF) IV  2 g Intravenous Q6H  . docusate sodium  100 mg Oral BID  . [START ON 12/15/2012] enoxaparin (LOVENOX) injection  30 mg Subcutaneous Q12H  . fentaNYL  100 mcg Transdermal Q72H  . [START ON 12/15/2012] finasteride  5 mg Oral Daily  . [START ON 12/15/2012] furosemide  40 mg Oral q morning - 10a  . hydrocerin  1 application Topical Daily  . HYDROmorphone      . [START ON 12/15/2012] insulin aspart  0-15 Units Subcutaneous TID WC  . [START ON 12/16/2012] insulin glargine  25 Units Subcutaneous QHS  . [START ON 12/15/2012] pantoprazole  40 mg Oral Daily  . pregabalin  150 mg Oral BID  . simvastatin  10 mg Oral QPM  . [START ON 12/15/2012] spironolactone  100 mg Oral q morning - 10a  . tamsulosin  0.4 mg Oral QPC supper   Infusions:  . sodium chloride 1,000 mL (12/14/12 1755)   PRN: bisacodyl, diphenhydrAMINE, loratadine, menthol-cetylpyridinium, methocarbamol (ROBAXIN) IV, methocarbamol, metoCLOPramide (REGLAN) injection, metoCLOPramide, morphine injection, ondansetron (ZOFRAN) IV, ondansetron, oxyCODONE, phenol, polyethylene glycol, polyvinyl alcohol, sodium phosphate,  traMADol  Assessment: 72 yo M w/p Left TKA Goal of Therapy:  INR 2-3 Monitor platelets by anticoagulation protocol: Yes   Plan:  1. Warfarin 10 mg po X1 at 2000 tonight 2. Daily PT/INR starting in am. 3. F/u labs in am 4. Coumadin teaching booklet/video for 9/9 10 am.  Gypsy Decant 12/14/2012,7:12 PM

## 2012-12-14 NOTE — H&P (View-Only) (Signed)
Keith [name]  DOB: 20-Feb-1941 Married / Language: English / Race: White Male  Date of Admission:  12/14/2012  Chief Complaint:  Left Knee Pain  History of Present Illness The patient is a 72 year old male who comes in for a preoperative History and Physical. The patient is scheduled for a left total knee arthroplasty to be performed by Dr. Dione Plover. Aluisio, MD at [facility] on 12/14/2012. The patient is a 72 year old male who presents with knee complaints. The patient was seen for a second opinion. The patient reports left knee symptoms including: pain, instability, giving way, weakness and stiffness which began year(s) ago. Note for "Knee pain": He has been seen here in the past. He has been treated at the [place]. He has had cortisone as well as the Synvisc One. He said neither have helped very much. He was seen many years ago one time visit for his knee. Unfortunately over time he has had progressively worsening pain and dysfunction. He is being treated at the [facility]. He was told that he needed a knee replacement but they did not have funding for those surgeries. He states that the knee is getting progressively worse and is leading to him having falls. The knee will give out on him frequently. He is at a stage now where he would like to get this fixed. The right knee does not bother him. He is not having hip pain with this. He is not having lower extremity weakness or paresthesia with this. They have been treated conservatively in the past for the above stated problem and despite conservative measures, they continue to have progressive pain and severe functional limitations and dysfunction. They have failed non-operative management including home exercise, medications, and injections. It is felt that they would benefit from undergoing total joint replacement. Risks and benefits of the procedure have been discussed with the patient and they elect to proceed with surgery.  There are no active contraindications to surgery such as ongoing infection or rapidly progressive neurological disease.  Problem List Primary osteoarthritis of one knee (715.16)   Allergies Contrast Media Ready-Box *MEDICAL DEVICES* Betadine *ANTISEPTICS & DISINFECTANTS*. iodine IODINE. 03/15/2005 MOTRIN. 03/15/2005   Family History Cerebrovascular Accident. father Heart Disease. mother   Social History Current work status. retired Engineer, agricultural (Currently). no Drug/Alcohol Rehab (Previously). no Children. 0 Alcohol use. current drinker; drinks wine; less than 5 per week Exercise. Exercises rarely Pain Contract. yes Tobacco use. former smoker; smoke(d) 2 pack(s) per day Number of flights of stairs before winded. 1 Illicit drug use. no Living situation. live with spouse Marital status. married Post-Surgical Plans. Wants to look into [facility] following surgery. Advance Directives. Living Will,, Healthcare POA   Medication History Lantus SoloStar ( Subcutaneous) Specific dose unknown - Active. NovoLOG FlexPen ( Subcutaneous) Specific dose unknown - Active. Folic Acid (1MG Tablet, Oral) Active. Pantoprazole Sodium (40MG Tablet DR, Oral) Active. Furosemide (40MG Tablet, Oral) Active. Clopidogrel Bisulfate (75MG Tablet, Oral) Active. Pregabalin (200MG Capsule, Oral) Active. Simvastatin (10MG Tablet, Oral) Active. FentaNYL (100MCG/HR Patch 72HR, Transdermal) Active. Tamsulosin HCl (0.4MG Capsule, Oral) Active. Hydrocodone-Acetaminophen (10-500MG Tablet, Oral) Active. Loratadine (10MG Tablet, Oral) Active. Finasteride (5MG Tablet, Oral) Active. Spironolactone (25MG Tablet, Oral) Active. MiraLax ( Oral) Active. Warfarin Sodium (5MG Tablet, Oral) Active.   Past Surgical History Cataract Surgery. left Foot Surgery. bilateral Gallbladder Surgery. open Appendectomy Left Foot 2nd Toe Amputation Right Foot 2nd Toe and Partial 3rd Toe  Amputation Partial Liver Resection. Date: 2005.  Medical History Liver disease Osteoarthritis High blood pressure Cerebrovascular Accident Diabetes Mellitus, Type II Gastroesophageal Reflux Disease Blood Clot Cancer. B Cell Follicular Lymphoma NASH (nonalcoholic steatohepatitis) (571.8) TIA (transient ischemic attack) (435.9). 02/2006 Impaired Hearing Sleep Apnea. Patient does not use the CPAP any more. Cataract. S/P Surgery Heart murmur Hypertension History of Orthostatic Hypotension Varicose veins Deep vein thrombosis Insulin Dependent Diabetes Mellitus   Review of Systems General:Present- Fatigue. Not Present- Chills, Fever, Night Sweats, Weight Gain, Weight Loss and Memory Loss. Skin:Present- Itching and Rash (lower legs). Not Present- Hives, Eczema and Lesions. HEENT:Present- Hearing Loss. Not Present- Tinnitus, Headache, Double Vision, Visual Loss and Dentures. Respiratory:Not Present- Shortness of breath with exertion, Shortness of breath at rest, Allergies, Coughing up blood and Chronic Cough. Cardiovascular:Not Present- Chest Pain, Racing/skipping heartbeats, Difficulty Breathing Lying Down, Murmur, Swelling and Palpitations. Gastrointestinal:Not Present- Bloody Stool, Heartburn, Abdominal Pain, Vomiting, Nausea, Constipation, Diarrhea, Difficulty Swallowing, Jaundice and Loss of appetitie. Male Genitourinary:Present- Urinating at Night. Not Present- Urinary frequency, Blood in Urine, Weak urinary stream, Discharge, Flank Pain, Incontinence, Painful Urination, Urgency and Urinary Retention. Musculoskeletal:Present- Muscle Pain, Joint Swelling and Joint Pain. Not Present- Muscle Weakness, Back Pain, Morning Stiffness and Spasms. Neurological:Present- Tremor. Not Present- Dizziness, Blackout spells, Paralysis, Difficulty with balance and Weakness. Psychiatric:Not Present- Insomnia.   Vitals Pulse: 58 (Regular) Resp.: 12 (Unlabored) BP:  106/58 (Sitting, Right Arm, Standard)    Physical Exam The physical exam findings are as follows:   General Mental Status - Alert, cooperative and good historian. General Appearance- pleasant. Not in acute distress. Orientation- Oriented X3. Build & Nutrition- Well nourished and Well developed.   Head and Neck Head- normocephalic, atraumatic . Neck Global Assessment- supple. no bruit auscultated on the right and no bruit auscultated on the left.   Eye Pupil- Bilateral- Regular and Round. Motion- Bilateral- EOMI.   Chest and Lung Exam Auscultation: Breath sounds:- clear at anterior chest wall and - clear at posterior chest wall. Adventitious sounds:- No Adventitious sounds.   Cardiovascular Auscultation:Rhythm- Regular rate and rhythm. Heart Sounds- S1 WNL and S2 WNL. Murmurs & Other Heart Sounds:Auscultation of the heart reveals - No Murmurs.   Abdomen Palpation/Percussion:Tenderness- Abdomen is non-tender to palpation. Rigidity (guarding)- Abdomen is soft. Auscultation:Auscultation of the abdomen reveals - Bowel sounds normal.   Male Genitourinary  Not done, not pertinent to present illness  Musculoskeletal On exam well developed male alert and oriented in no apparent distress. Evaluation of his hips show normal range of motion with no discomfort. The right knee shows no effusion. Range about 0 to 135. Slight crepitus on range of motion with no jointline tenderness or instability. Left knee no effusion. Varus deformity. Range 5 to 120. Marked crepitus on range of motion. Tenderness medial greater than lateral with no instability noted. He does have 1+ peripheral edema in both lower legs.  RADIOGRAPHS: AP both knees and lateral show that he has severe bone on bone arthritis in the medial and patellofemoral compartments of the left knee.  Assessment & Plan Primary osteoarthritis of one knee (715.16) Impression: Left Knee  Note: Plan  is for a Left Total Knee Replacement by Dr. Wynelle Link.  Plan is to go to rehab. He wants to look into [facility].  PCP - Dr. Osborne Casco  The patient will not receive TXA (tranexamic acid) due to: History of Blood Clots, Cancer, CVA/TIA  Time spent ~ 40 minutes  Signed electronically by Joelene Millin, III PA-C

## 2012-12-14 NOTE — Transfer of Care (Signed)
Immediate Anesthesia Transfer of Care Note  Patient: Keith [name]  Procedure(s) Performed: Procedure(s): LEFT TOTAL KNEE ARTHROPLASTY (Left)  Patient Location: PACU  Anesthesia Type:MAC and Spinal  Level of Consciousness: awake, alert , oriented and patient cooperative  Airway & Oxygen Therapy: Patient Spontanous Breathing and Patient connected to face mask oxygen  Post-op Assessment: Report given to PACU RN and Post -op Vital signs reviewed and stable  Post vital signs: Reviewed and stable  Complications: No apparent anesthesia complications

## 2012-12-14 NOTE — Anesthesia Procedure Notes (Signed)
Spinal  Patient location during procedure: OR Staffing Anesthesiologist: Montez Hageman Performed by: anesthesiologist  Preanesthetic Checklist Completed: patient identified, site marked, surgical consent, pre-op evaluation, timeout performed, IV checked, risks and benefits discussed and monitors and equipment checked Spinal Block Patient position: sitting Prep: ChloraPrep Patient monitoring: heart rate, continuous pulse ox and blood pressure Approach: right paramedian Location: L4-5 Injection technique: single-shot Needle Needle type: Spinocan  Needle gauge: 22 G Needle length: 9 cm Additional Notes Expiration date of kit checked and confirmed. Patient tolerated procedure well, without complications.

## 2012-12-15 ENCOUNTER — Encounter (HOSPITAL_COMMUNITY): Payer: Self-pay | Admitting: Orthopedic Surgery

## 2012-12-15 DIAGNOSIS — E871 Hypo-osmolality and hyponatremia: Secondary | ICD-10-CM

## 2012-12-15 DIAGNOSIS — D62 Acute posthemorrhagic anemia: Secondary | ICD-10-CM

## 2012-12-15 LAB — BASIC METABOLIC PANEL
BUN: 16 mg/dL (ref 6–23)
CO2: 25 mEq/L (ref 19–32)
Chloride: 103 mEq/L (ref 96–112)
Creatinine, Ser: 1.1 mg/dL (ref 0.50–1.35)
Glucose, Bld: 206 mg/dL — ABNORMAL HIGH (ref 70–99)

## 2012-12-15 LAB — CBC
HCT: 30.2 % — ABNORMAL LOW (ref 39.0–52.0)
Hemoglobin: 10.2 g/dL — ABNORMAL LOW (ref 13.0–17.0)
MCH: 29.2 pg (ref 26.0–34.0)
MCV: 86.5 fL (ref 78.0–100.0)
RBC: 3.49 MIL/uL — ABNORMAL LOW (ref 4.22–5.81)
WBC: 5.8 10*3/uL (ref 4.0–10.5)

## 2012-12-15 LAB — PROTIME-INR: Prothrombin Time: 15.1 seconds (ref 11.6–15.2)

## 2012-12-15 LAB — GLUCOSE, CAPILLARY
Glucose-Capillary: 178 mg/dL — ABNORMAL HIGH (ref 70–99)
Glucose-Capillary: 380 mg/dL — ABNORMAL HIGH (ref 70–99)

## 2012-12-15 MED ORDER — SODIUM CHLORIDE 0.9 % IV BOLUS (SEPSIS)
250.0000 mL | Freq: Once | INTRAVENOUS | Status: AC
  Administered 2012-12-15: 250 mL via INTRAVENOUS

## 2012-12-15 MED ORDER — WARFARIN SODIUM 10 MG PO TABS
10.0000 mg | ORAL_TABLET | Freq: Once | ORAL | Status: AC
  Administered 2012-12-15: 10 mg via ORAL
  Filled 2012-12-15: qty 1

## 2012-12-15 NOTE — Progress Notes (Signed)
Physical Therapy Treatment Patient Details Name: Keith [name] MRN: [id number] DOB: 05-Aug-1940 Today's Date: 12/15/2012 Time: 2417-5301 PT Time Calculation (min): 40 min  PT Assessment / Plan / Recommendation  History of Present Illness Pt presents with L TKA   PT Comments   Pt much more awake and ambulated x 80 '.   Follow Up Recommendations  SNF     Does the patient have the potential to tolerate intense rehabilitation     Barriers to Discharge        Equipment Recommendations  None recommended by PT    Recommendations for Other Services    Frequency 7X/week   Progress towards PT Goals Progress towards PT goals: Progressing toward goals (pt more alert.)  Plan Current plan remains appropriate    Precautions / Restrictions Precautions Precautions: Knee;Fall Required Braces or Orthoses: Knee Immobilizer - Left   Pertinent Vitals/Pain 6, RN notified.    Mobility  Bed Mobility Bed Mobility: Sit to Supine Supine to Sit: 3: Mod assist Details for Bed Mobility Assistance: assist LLE onto bed.  Transfers Sit to Stand: 1: +2 Total assist;With upper extremity assist;With armrests;From chair/3-in-1 Stand to Sit: To bed;With upper extremity assist;4: Min assist Details for Transfer Assistance: cues for pushing from recliner, reaching to bed, LLE position prior to sitting down. Ambulation/Gait Ambulation/Gait Assistance: 1: +2 Total assist Ambulation/Gait: Patient Percentage: 70% Ambulation Distance (Feet): 80 Feet Ambulation/Gait Assistance Details: multimodal cues for sequence, safe use of RW and posture. Gait Pattern: Step-to pattern;Decreased step length - left;Decreased stance time - left;Antalgic;Trunk flexed Gait velocity: slow    Exercises Total Joint Exercises Quad Sets: AROM;Both;10 reps;Supine Heel Slides: AAROM;Left;Supine Straight Leg Raises: AAROM;Left;10 reps;Supine   PT Diagnosis:    PT Problem List:   PT Treatment Interventions:     PT Goals  (current goals can now be found in the care plan section)    Visit Information  Last PT Received On: 12/15/12 Assistance Needed: +2 History of Present Illness: Pt presents with L TKA    Subjective Data      Cognition  Cognition Arousal/Alertness: Awake/alert Behavior During Therapy: WFL for tasks assessed/performed    Balance     End of Session PT - End of Session Equipment Utilized During Treatment: Gait belt Activity Tolerance: Patient tolerated treatment well Patient left: in bed;with call bell/phone within reach Nurse Communication: Mobility status and need for meds.   GP     Claretha Cooper 12/15/2012, 4:58 PM Tresa Endo PT [phone]

## 2012-12-15 NOTE — Progress Notes (Signed)
ANTICOAGULATION CONSULT NOTE - Initial Consult  Pharmacy Consult for Warfarin Indication: VTE prophylaxis  Allergies  Allergen Reactions  . Iodine Rash   Patient Measurements: Height: 5' 9.5" (176.5 cm) Weight: 275 lb (124.739 kg) (documented on 8/28) IBW/kg (Calculated) : 71.85  Vital Signs: Temp: 99.3 F (37.4 C) (09/09 1030) Temp src: Oral (09/09 0131) BP: 101/57 mmHg (09/09 1030) Pulse Rate: 87 (09/09 1030)  Labs:  Recent Labs  12/14/12 1240 12/15/12 0410  HGB  --  10.2*  HCT  --  30.2*  PLT  --  90*  LABPROT 14.0 15.1  INR 1.10 1.22  CREATININE  --  1.10   Estimated Creatinine Clearance: 82.2 ml/min (by C-G formula based on Cr of 1.1).  Assessment: 72 yo M w/p left TKA. On chronic Coumadin for hx DVT and CVA. Regimen PTA: 95m daily except 7.570mon Wed and Sun.  Lovenox 3013m12H for bridging.  INR rose a little after 47m59m1.  H/H down as expected. Pltc 101 > 90. No bleeding reported/documented.  Poor appetite but improving. Denies nausea.  Continuing to hold PTA Plavix.  Goal of Therapy:  INR 2-3 Monitor platelets by anticoagulation protocol: Yes   Plan:   Coumadin 10 mg today at 1800.  F/u daily PT/INR.  Reviewed Coumadin with the patient and his wife.  Tom Romeo RabonarmD, pager [phone],12:19 PM.

## 2012-12-15 NOTE — Progress Notes (Signed)
Utilization review completed.  

## 2012-12-15 NOTE — Progress Notes (Signed)
Clinical Social Work Department BRIEF PSYCHOSOCIAL ASSESSMENT 12/15/2012  Patient:  Keith [name], Keith [name]     Account Number:  [id number]     Admit date:  12/14/2012  Clinical Social Worker:  Lacie Scotts  Date/Time:  12/15/2012 12:09 PM  Referred by:  Physician  Date Referred:  12/15/2012 Referred for  SNF Placement   Other Referral:   Interview type:  Patient Other interview type:    PSYCHOSOCIAL DATA Living Status:  WIFE Admitted from facility:   Level of care:   Primary support name:  Gevena Cotton Primary support relationship to patient:  SPOUSE Degree of support available:   supportive    CURRENT CONCERNS Current Concerns  Post-Acute Placement   Other Concerns:    SOCIAL WORK ASSESSMENT / PLAN Pt is a 72 yr old gentleman living at home prior to hospitalization. CSW met with pt to assist with d/c planning. Pt would like to have rehab at [facility] following hospital d/c . SNF has been contacted and  clinical info is being reviewed. Decision is pending. CSW has initiated SNF search in case Keith [name] is unable to assist. Bed offers will be provided as received.   Assessment/plan status:  Psychosocial Support/Ongoing Assessment of Needs Other assessment/ plan:   Information/referral to community resources:   SNF list with bed offers to be provided.    PATIENT'S/FAMILY'S RESPONSE TO PLAN OF CARE: Pt hopes to have ST Rehab at [facility].   Werner Lean LCSW [phone]

## 2012-12-15 NOTE — Progress Notes (Signed)
Patient reports severe pain unrelieved by morphine and oxycodone.  [employer] PA paged, new orders received.  Will continue to monitor.

## 2012-12-15 NOTE — Progress Notes (Signed)
Clinical Social Work Department CLINICAL SOCIAL WORK PLACEMENT NOTE 12/15/2012  Patient:  Keith [name], Keith [name]  Account Number:  [id number] Admit date:  12/14/2012  Clinical Social Worker:  Werner Lean, LCSW  Date/time:  12/15/2012 12:16 PM  Clinical Social Work is seeking post-discharge placement for this patient at the following level of care:   SKILLED NURSING   (*CSW will update this form in Epic as items are completed)   12/15/2012  Patient/family provided with [facility] Department of Clinical Social Work's list of facilities offering this level of care within the geographic area requested by the patient (or if unable, by the patient's family).  12/15/2012  Patient/family informed of their freedom to choose among providers that offer the needed level of care, that participate in Medicare, Medicaid or managed care program needed by the patient, have an available bed and are willing to accept the patient.    Patient/family informed of [facility]' ownership interest in [facility], as well as of the fact that they are under no obligation to receive care at this facility.  PASARR submitted to EDS on 12/15/2012 PASARR number received from EDS on 12/15/2012  FL2 transmitted to all facilities in geographic area requested by pt/family on  12/15/2012 FL2 transmitted to all facilities within larger geographic area on   Patient informed that his/her managed care company has contracts with or will negotiate with  certain facilities, including the following:     Patient/family informed of bed offers received:   Patient chooses bed at  Physician recommends and patient chooses bed at    Patient to be transferred to  on   Patient to be transferred to facility by   The following physician request were entered in Epic:   Additional Comments:  CSW will assist with [facility] authorization for SNF and Ambulance transport.  Werner Lean LCSW [phone]

## 2012-12-15 NOTE — Progress Notes (Addendum)
Patient called me to the room to inspect a "needle."  Upon arriving to the room I found the patient's Hemovac was no longer in place and blood had begun to leak onto the bed.  I placed 4x4's and paper on his leg.  Will continue to monitor.

## 2012-12-15 NOTE — Evaluation (Signed)
Physical Therapy Evaluation Patient Details Name: Keith [name] MRN: [id number] DOB: Aug 22, 1940 Today's Date: 12/15/2012 Time: 3716-9678 PT Time Calculation (min): 36 min  PT Assessment / Plan / Recommendation History of Present Illness  Pt presents with L TKA  Clinical Impression  Pt is very drowsy. Did perk up while ambulating. Pt will benefit from PT while in acute care to improve in functional mobility and transfers, safety and strength  to DC to next venue.    PT Assessment       Follow Up Recommendations  SNF    Does the patient have the potential to tolerate intense rehabilitation      Barriers to Discharge        Equipment Recommendations  None recommended by PT    Recommendations for Other Services     Frequency      Precautions / Restrictions Precautions Precautions: Knee;Fall Required Braces or Orthoses: Knee Immobilizer - Left Restrictions Weight Bearing Restrictions: No Other Position/Activity Restrictions: WBAT   Pertinent Vitals/Pain L knee 6, premedicated.      Mobility  Bed Mobility Bed Mobility: Supine to Sit Supine to Sit: 1: +2 Total assist Supine to Sit: Patient Percentage: 10% Details for Bed Mobility Assistance: assist for LEs over to EOB, heavy assist for trunk to upright and use of the bed pad to help scoot hips around to  EOB Transfers Sit to Stand: 1: +2 Total assist;With upper extremity assist;From bed Sit to Stand: Patient Percentage: 70% Stand to Sit: 1: +2 Total assist;With upper extremity assist;To chair/3-in-1 Stand to Sit: Patient Percentage: 50% Details for Transfer Assistance: Pt with much more difficulty with stand to sit. He needs increased time for all transition and max verbal and tactile cues to follow commands. Assist to rise and steady as well as assist for LE management and hand placement.  Ambulation/Gait Ambulation/Gait Assistance: 1: +2 Total assist Ambulation/Gait: Patient Percentage: 60% Ambulation Distance  (Feet): 20 Feet Assistive device: Rolling walker Ambulation/Gait Assistance Details: Pt required frequent multilodal cues for safety, staying alert, extra time for walking due to lethargy. Gait Pattern: Step-to pattern;Decreased step length - left;Decreased stance time - left;Antalgic;Trunk flexed Gait velocity: slow    Exercises     PT Diagnosis:    PT Problem List:   PT Treatment Interventions:       PT Goals(Current goals can be found in the care plan section) Acute Rehab PT Goals Patient Stated Goal: pt agreeable to walk PT Goal Formulation: With patient/family Time For Goal Achievement: 12/22/12 Potential to Achieve Goals: Good  Visit Information  Last PT Received On: 12/15/12 Assistance Needed: +2 History of Present Illness: Pt presents with L TKA       Prior [place] expects to be discharged to:: Skilled nursing facility Living Arrangements: Spouse/significant other Prior Function Level of Independence: Needs assistance ADL's / Homemaking Assistance Needed: per spouse, pt needed help with dressing tasks and other ADL due to 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   Balance    End of Session PT - End of Session Equipment Utilized During Treatment: Gait belt Activity Tolerance: Patient  limited by fatigue Patient left: in chair;with call bell/phone within reach;with family/visitor present Nurse Communication: Mobility status  GP     Claretha Cooper 12/15/2012, 12:54 PM  Tresa Endo PT [phone]

## 2012-12-15 NOTE — Progress Notes (Signed)
   Subjective: 1 Day Post-Op Procedure(s) (LRB): LEFT TOTAL KNEE ARTHROPLASTY (Left) Patient reports pain as moderate and severe last night.  Lots of spasms this morning.   Patient seen in rounds with Dr. Wynelle Link. Patient is having problems with pain in the knee, requiring pain medications We will start therapy today.  Plan is to go SNF after hospital stay.  Objective: Vital signs in last 24 hours: Temp:  [95.9 F (35.5 C)-98.2 F (36.8 C)] 98 F (36.7 C) (09/09 0131) Pulse Rate:  [53-118] 97 (09/09 0717) Resp:  [11-20] 16 (09/09 0717) BP: (108-136)/(69-80) 108/74 mmHg (09/09 0717) SpO2:  [96 %-100 %] 99 % (09/09 0717) Weight:  [124.739 kg (275 lb)] 124.739 kg (275 lb) (09/09 0700)  Intake/Output from previous day:  Intake/Output Summary (Last 24 hours) at 12/15/12 0718 Last data filed at 12/15/12 0500  Gross per 24 hour  Intake   3405 ml  Output   1905 ml  Net   1500 ml    Intake/Output this shift: Only 200 cc UOP documented since MN  Labs:  Recent Labs  12/15/12 0410  HGB 10.2*    Recent Labs  12/15/12 0410  WBC 5.8  RBC 3.49*  HCT 30.2*  PLT 90*    Recent Labs  12/15/12 0410  NA 134*  K 4.7  CL 103  CO2 25  BUN 16  CREATININE 1.10  GLUCOSE 206*  CALCIUM 8.2*    Recent Labs  12/14/12 1240 12/15/12 0410  INR 1.10 1.22    EXAM General - Patient is Alert and Appropriate Extremity - Neurovascular intact Sensation intact distally Dorsiflexion/Plantar flexion intact Dressing - dressing C/D/I Motor Function - intact, moving foot and toes well on exam.  Hemovac came out last night when moving around in then bed.  Past Medical History  Liver disease  Osteoarthritis  High blood pressure  Cerebrovascular Accident  Diabetes Mellitus, Type II  Gastroesophageal Reflux Disease  Blood Clot  Cancer. B Cell Follicular Lymphoma  NASH (nonalcoholic steatohepatitis) (571.8)  TIA (transient ischemic attack) (435.9). 02/2006  Impaired Hearing    Sleep Apnea. Patient does not use the CPAP any more.  Cataract. S/P Surgery  Heart murmur  Hypertension  History of Orthostatic Hypotension  Varicose veins  Deep vein thrombosis  Insulin Dependent Diabetes Mellitus   Assessment/Plan: 1 Day Post-Op Procedure(s) (LRB): LEFT TOTAL KNEE ARTHROPLASTY (Left) Principal Problem:   OA (osteoarthritis) of knee Active Problems:   Postoperative anemia due to acute blood loss   Hyponatremia  Estimated body mass index is 40.04 kg/(m^2) as calculated from the following:   Height as of this encounter: 5' 9.5" (1.765 m).   Weight as of this encounter: 124.739 kg (275 lb). Advance diet Up with therapy Continue foley due to strict I&O and urinary output monitoring  DVT Prophylaxis - Lovenox and Coumadin Weight-Bearing as tolerated to left leg No vaccines. D/C O2 and Pulse OX and try on Room Air  PERKINS, Augusto Garbe 12/15/2012, 7:18 AM

## 2012-12-15 NOTE — Evaluation (Signed)
Occupational Therapy Evaluation Patient Details Name: Keith [name] MRN: [id number] DOB: March 01, 1941 Today's Date: 12/15/2012 Time: 0254-2706 OT Time Calculation (min): 31 min  OT Assessment / Plan / Recommendation History of present illness Pt presents with L TKA   Clinical Impression   Pt limited at eval due to groggy/lethargic state. Pt able to wake up enough to interact some with functional transfers but needed max verbal and tactile cues for following commands and safety. Pt will benefit from skilled OT services to maximize ADL independence for next venue.    OT Assessment  Patient needs continued OT Services    Follow Up Recommendations  SNF;Supervision/Assistance - 24 hour    Barriers to Discharge      Equipment Recommendations   (would have to confirm DME owned with family)    Recommendations for Other Services    Frequency  Min 2X/week    Precautions / Restrictions Precautions Precautions: Knee;Fall Required Braces or Orthoses: Knee Immobilizer - Left Restrictions Weight Bearing Restrictions: No Other Position/Activity Restrictions: WBAT   Pertinent Vitals/Pain Didn't rate pain but did groan at times with weightbearing on L LE.    ADL  Eating/Feeding: Set up (due to arousal level) Where Assessed - Eating/Feeding: Chair Grooming: Simulated;Wash/dry hands;Supervision/safety;Set up Where Assessed - Grooming: Supported sitting Upper Body Bathing: Chest;Right arm;Left arm;Abdomen;Minimal assistance (due to groggy state) Where Assessed - Upper Body Bathing: Unsupported sitting Lower Body Bathing: +2 Total assistance Lower Body Bathing: Patient Percentage: 30% Where Assessed - Lower Body Bathing: Supported sit to stand Upper Body Dressing: Moderate assistance (due to groggy state) Where Assessed - Upper Body Dressing: Unsupported sitting Lower Body Dressing: +2 Total assistance Lower Body Dressing: Patient Percentage: 10% Where Assessed - Lower Body Dressing:  Supported sit to stand Toilet Transfer: +2 Total assistance (pt 70% to stand. 50% to sit. see notes below) Toilet Transfer: Patient Percentage: 50% Toilet Transfer Method: Other (comment);Sit to stand (stand to sit.) Toileting - Clothing Manipulation and Hygiene: +2 Total assistance Toileting - Clothing Manipulation and Hygiene: Patient Percentage: 0% (pt groggy) Where Assessed - Toileting Clothing Manipulation and Hygiene: Standing Equipment Used: Gait belt;Rolling walker ADL Comments: Pt very groggy/lethargic and hard to arousal despite repeated tacticle and verbal cues. Pt opened eyes but then would close them again. Pt finally started to wake up some and interact when up ambulating but once in recliner at end of session, pt already dosing off again. Wife came in at end of session and confirmed that he does need assist with ADL even PTA due to neuropathy in hands. Pt needs constant cues for walker sequence, walker use and overall safety due to groggy state.     OT Diagnosis: Generalized weakness  OT Problem List: Decreased strength;Decreased knowledge of use of DME or AE OT Treatment Interventions: Self-care/ADL training;DME and/or AE instruction;Therapeutic activities;Patient/family education   OT Goals(Current goals can be found in the care plan section) Acute Rehab OT Goals Patient Stated Goal: pt agreeable to walk OT Goal Formulation: With patient/family Time For Goal Achievement: 12/22/12 Potential to Achieve Goals: Good  Visit Information  Last OT Received On: 12/15/12 Assistance Needed: +2 PT/OT Co-Evaluation/Treatment: Yes History of Present Illness: Pt presents with L TKA       Prior [place] expects to be discharged to:: Skilled nursing facility Living Arrangements: Spouse/significant other Prior Function Level of Independence: Needs assistance ADL's / Homemaking Assistance Needed: per spouse, pt needed help with dressing tasks and  other ADL due  to neuropathy in fingertips         Vision/Perception     Cognition  Cognition Arousal/Alertness: Lethargic Behavior During Therapy: WFL for tasks assessed/performed    Extremity/Trunk Assessment Upper Extremity Assessment Upper Extremity Assessment: Overall WFL for tasks assessed (per wife, pt has neuropathy in bilateral hand)     Mobility Bed Mobility Bed Mobility: Supine to Sit Supine to Sit: 1: +2 Total assist Supine to Sit: Patient Percentage: 10% Details for Bed Mobility Assistance: assist for LEs over to EOB, heavy assist for trunk to upright and use of the bed pad to help scoot hips around to  EOB Transfers Transfers: Sit to Stand;Stand to Sit Sit to Stand: 1: +2 Total assist;With upper extremity assist;From bed Sit to Stand: Patient Percentage: 70% Stand to Sit: 1: +2 Total assist;With upper extremity assist;To chair/3-in-1 Stand to Sit: Patient Percentage: 50% Details for Transfer Assistance: Pt with much more difficulty with stand to sit. He needs increased time for all transition and max verbal and tactile cues to follow commands. Assist to rise and steady as well as assist for LE management and hand placement.      Exercise     Balance     End of Session OT - End of Session Equipment Utilized During Treatment: Gait belt Activity Tolerance: Patient limited by lethargy Patient left: in chair;with call bell/phone within reach;with family/visitor present  GO     Jules Schick [phone] 12/15/2012, 12:27 PM

## 2012-12-16 LAB — GLUCOSE, CAPILLARY
Glucose-Capillary: 281 mg/dL — ABNORMAL HIGH (ref 70–99)
Glucose-Capillary: 211 mg/dL — ABNORMAL HIGH (ref 70–99)
Glucose-Capillary: 242 mg/dL — ABNORMAL HIGH (ref 70–99)

## 2012-12-16 LAB — CBC
HCT: 24.1 % — ABNORMAL LOW (ref 39.0–52.0)
MCH: 29 pg (ref 26.0–34.0)
MCHC: 33.6 g/dL (ref 30.0–36.0)
MCV: 86.4 fL (ref 78.0–100.0)
RDW: 15.5 % (ref 11.5–15.5)

## 2012-12-16 LAB — BASIC METABOLIC PANEL
BUN: 18 mg/dL (ref 6–23)
Creatinine, Ser: 1.11 mg/dL (ref 0.50–1.35)
GFR calc Af Amer: 76 mL/min — ABNORMAL LOW (ref >90)
GFR calc non Af Amer: 65 mL/min — ABNORMAL LOW (ref >90)

## 2012-12-16 MED ORDER — WARFARIN SODIUM 7.5 MG PO TABS
7.5000 mg | ORAL_TABLET | Freq: Once | ORAL | Status: AC
  Administered 2012-12-16: 7.5 mg via ORAL
  Filled 2012-12-16: qty 1

## 2012-12-16 NOTE — Progress Notes (Signed)
   Subjective: 2 Days Post-Op Procedure(s) (LRB): LEFT TOTAL KNEE ARTHROPLASTY (Left) Patient reports pain as moderate.   Patient seen in rounds with Dr. Wynelle Link. Patient is having problems with lots of spasms Plan is to go Skilled nursing facility after hospital stay.  Objective: Vital signs in last 24 hours: Temp:  [98.5 F (36.9 C)-98.9 F (37.2 C)] 98.9 F (37.2 C) (09/10 0516) Pulse Rate:  [89-104] 89 (09/10 0516) Resp:  [16-18] 16 (09/10 0516) BP: (88-117)/(56-71) 117/67 mmHg (09/10 0516) SpO2:  [94 %-99 %] 95 % (09/10 0516)  Intake/Output from previous day:  Intake/Output Summary (Last 24 hours) at 12/16/12 1042 Last data filed at 12/16/12 0900  Gross per 24 hour  Intake   2220 ml  Output   1345 ml  Net    875 ml    Intake/Output this shift: Total I/O In: 100 [P.O.:100] Out: -   Labs:  Recent Labs  12/15/12 0410 12/16/12 0413  HGB 10.2* 8.1*    Recent Labs  12/15/12 0410 12/16/12 0413  WBC 5.8 4.6  RBC 3.49* 2.79*  HCT 30.2* 24.1*  PLT 90* 61*    Recent Labs  12/15/12 0410 12/16/12 0413  NA 134* 132*  K 4.7 4.1  CL 103 101  CO2 25 26  BUN 16 18  CREATININE 1.10 1.11  GLUCOSE 206* 246*  CALCIUM 8.2* 8.0*    Recent Labs  12/15/12 0410 12/16/12 0413  INR 1.22 1.43    EXAM General - Patient is Alert and Appropriate Extremity - Neurovascular intact Sensation intact distally Dorsiflexion/Plantar flexion intact Dressing/Incision - clean, dry, no drainage, healing Motor Function - intact, moving foot and toes well on exam.   Past Medical History  Diagnosis Date  . Diabetes mellitus   . Hypertension   . Stroke   . Prostate hypertrophy   . Cirrhosis of liver   . Hepatic cancer     Assessment/Plan: 2 Days Post-Op Procedure(s) (LRB): LEFT TOTAL KNEE ARTHROPLASTY (Left) Principal Problem:   OA (osteoarthritis) of knee Active Problems:   Postoperative anemia due to acute blood loss   Hyponatremia  Estimated body mass index  is 40.04 kg/(m^2) as calculated from the following:   Height as of this encounter: 5' 9.5" (1.765 m).   Weight as of this encounter: 124.739 kg (275 lb). Up with therapy Plan for discharge tomorrow Discharge to SNF  DVT Prophylaxis - Coumadin Weight-Bearing as tolerated to left leg  Keith [name] 12/16/2012, 10:42 AM

## 2012-12-16 NOTE — Progress Notes (Signed)
Physical Therapy Treatment Patient Details Name: DVONTE GATLIFF MRN: [id number] DOB: [birth year] Today's Date: 12/16/2012 Time: 7494-4967 PT Time Calculation (min): 35 min  PT Assessment / Plan / Recommendation  History of Present Illness Pt presents with L TKA   PT Comments   .PT FATIGUED AFTER TRIP TO BATHROOM. PT' PLANS TO dc TOMORROW. VRBAL CUES FOR SAFETY, POSTURE AND LLE Support for mobility.  Follow Up Recommendations  SNF     Does the patient have the potential to tolerate intense rehabilitation     Barriers to Discharge        Equipment Recommendations       Recommendations for Other Services    Frequency 7X/week   Progress towards PT Goals Progress towards PT goals: Progressing toward goals  Plan Current plan remains appropriate    Precautions / Restrictions Precautions Precautions: Knee;Fall Required Braces or Orthoses: Knee Immobilizer - Left   Pertinent Vitals/Pain L knee 6, premedicated.    Mobility  Bed Mobility Bed Mobility: Supine to Sit Supine to Sit: 3: Mod assist Details for Bed Mobility Assistance: assist for L LE and trunk to upright. use of bed pad to fully scoot hips around to EOB. Transfers Sit to Stand: 3: Mod assist;With upper extremity assist;From bed;From chair/3-in-1 Stand to Sit: 3: Mod assist;With upper extremity assist;To chair/3-in-1 Details for Transfer Assistance: verbal cues for safety, assist for extending L LE out in front, to help find armrest on chair/3in1, and also assist to rise and steady/control descent. Ambulation/Gait Ambulation/Gait Assistance: 3: Mod assist Ambulation Distance (Feet): 20 Feet (x2) Ambulation/Gait Assistance Details: multimodal cues for safety and sequence and posture. Pt fatigued. Gait Pattern: Step-to pattern;Decreased step length - left;Decreased stance time - left;Antalgic;Trunk flexed Gait velocity: slow    Exercises     PT Diagnosis:    PT Problem List:   PT Treatment Interventions:      PT Goals (current goals can now be found in the care plan section)    Visit Information  Last PT Received On: 12/16/12 Assistance Needed: +2 History of Present Illness: Pt presents with L TKA    Subjective Data      Cognition  Cognition Arousal/Alertness: Awake/alert Behavior During Therapy: WFL for tasks assessed/performed Overall Cognitive Status: Within Functional Limits for tasks assessed    Balance     End of Session PT - End of Session Equipment Utilized During Treatment: Gait belt Activity Tolerance: Patient tolerated treatment well;Patient limited by fatigue Patient left: with call bell/phone within reach;in chair Nurse Communication: Mobility status   GP     Claretha Cooper 12/16/2012, 3:56 PM

## 2012-12-16 NOTE — Care Management Note (Signed)
    Page 1 of 1   12/16/2012     11:59:31 AM   CARE MANAGEMENT NOTE 12/16/2012  Patient:  Keith [name], Keith [name]   Account Number:  [id number]  Date Initiated:  12/16/2012  Documentation initiated by:  Sherrin Daisy  Subjective/Objective Assessment:   Dx: OA left knee: Left total knee replacement     Action/Plan:   Plans are for SNF rehab after hosp discharge.   Anticipated DC Date:  12/17/2012   Anticipated DC Plan:  SKILLED NURSING FACILITY  In-house referral  Clinical Social Worker      DC Planning Services  CM consult      Choice offered to / List presented to:             Status of service:  Completed, signed off Medicare Important Message given?  NA - LOS <3 / Initial given by admissions (If response is "NO", the following Medicare IM given date fields will be blank) Date Medicare IM given:   Date Additional Medicare IM given:    Discharge Disposition:    Per UR Regulation:    If discussed at Long Length of Stay Meetings, dates discussed:    Comments:

## 2012-12-16 NOTE — Progress Notes (Signed)
ANTICOAGULATION CONSULT NOTE - follow up consult  Pharmacy Consult for Warfarin Indication: VTE prophylaxis  Allergies  Allergen Reactions  . Iodine Rash   Patient Measurements: Height: 5' 9.5" (176.5 cm) Weight: 275 lb (124.739 kg) (documented on 8/28) IBW/kg (Calculated) : 71.85  Vital Signs: Temp: 98.9 F (37.2 C) (09/10 0516) Temp src: Oral (09/10 0516) BP: 117/67 mmHg (09/10 0516) Pulse Rate: 89 (09/10 0516)  Labs:  Recent Labs  12/14/12 1240 12/15/12 0410 12/16/12 0413  HGB  --  10.2* 8.1*  HCT  --  30.2* 24.1*  PLT  --  90* 61*  LABPROT 14.0 15.1 17.1*  INR 1.10 1.22 1.43  CREATININE  --  1.10 1.11   Estimated Creatinine Clearance: 81.5 ml/min (by C-G formula based on Cr of 1.11).  Assessment: 72 yo M s/p left TKA. On chronic Coumadin for hx DVT and CVA. Regimen PTA: 70m daily except 7.59mon Wed and Sun.  Lovenox 3063m12H for bridging.  INR rising nicely after 58m20m2.  Hgb cont to drift down.    Platelets dropping(101 -> 90 -> 60). No bleeding reported/documented. VSS. Not likely HIT. Patient's baseline platelets appear to run low.  Tolerated 100% of dinner.  Continuing to hold PTA Plavix.  Goal of Therapy:  INR 2-3 Monitor platelets by anticoagulation protocol: Yes   Plan:   Coumadin 7.5 mg today at 1800.  Stop Lovenox d/t thrombocytopenia/bleeding risk. The patient received a dose this AM. Next dose would be due at 2000. Ortho - please resume Lovenox if you disagree.  F/u daily PT/INR.  Tom Romeo RabonarmD, pager [phone],7:30 AM.

## 2012-12-16 NOTE — Progress Notes (Signed)
Occupational Therapy Treatment Patient Details Name: Keith [name] MRN: [id number] DOB: 02-Dec-1940 Today's Date: 12/16/2012 Time: 2500-3704 OT Time Calculation (min): 41 min  OT Assessment / Plan / Recommendation  History of present illness Pt presents with L TKA   OT comments  Pt more alert with therapy and tolerated toilet transfer. Still needs cues for technique and safety with RW and required mod assist for toilet transfer. Will benefit from SNF at d/c.    Follow Up Recommendations  SNF;Supervision/Assistance - 24 hour    Barriers to Discharge       Equipment Recommendations  Other (comment) (TBD next venue)    Recommendations for Other Services    Frequency Min 2X/week   Progress towards OT Goals Progress towards OT goals: Progressing toward goals  Plan Discharge plan remains appropriate    Precautions / Restrictions Precautions Precautions: Knee;Fall Required Braces or Orthoses: Knee Immobilizer - Left Restrictions Weight Bearing Restrictions: No Other Position/Activity Restrictions: WBAT   Pertinent Vitals/Pain Complaining of headache on commode but subsided after about 5 minutes.    ADL  Toilet Transfer: Moderate assistance Toilet Transfer Equipment: Raised toilet seat with arms (or 3-in-1 over toilet) Equipment Used: Rolling walker ADL Comments: Pt still needs cues for hand placement, RW safety and to extend L LE out in front. He tends to push walker too far in front. He also needs cues for hand placement as he tries to reach around for grab bar in shower in standing (shower in front) rather than reaching for walker.     OT Diagnosis:    OT Problem List:   OT Treatment Interventions:     OT Goals(current goals can now be found in the care plan section) Acute Rehab OT Goals Patient Stated Goal: agreeable to get up to commode  Visit Information  Last OT Received On: 12/16/12 Assistance Needed: +2 (for safety) PT/OT Co-Evaluation/Treatment:  Yes History of Present Illness: Pt presents with L TKA    Subjective Data      Prior Functioning       Cognition  Cognition Arousal/Alertness: Awake/alert Behavior During Therapy: WFL for tasks assessed/performed Overall Cognitive Status: Within Functional Limits for tasks assessed    Mobility  Bed Mobility Bed Mobility: Supine to Sit Supine to Sit: 3: Mod assist Details for Bed Mobility Assistance: assist for L LE and trunk to upright. use of bed pad to fully scoot hips around to EOB. Transfers Transfers: Sit to Stand;Stand to Sit Sit to Stand: 3: Mod assist;With upper extremity assist;From bed;From chair/3-in-1 Stand to Sit: 3: Mod assist;With upper extremity assist;To chair/3-in-1 Details for Transfer Assistance: verbal cues for safety, assist for extending L LE out in front, to help find armrest on chair/3in1, and also assist to rise and steady/control descent.    Exercises      Balance     End of Session OT - End of Session Equipment Utilized During Treatment: Gait belt Activity Tolerance: Patient tolerated treatment well Patient left: in chair;with call bell/phone within reach  [place] [phone] 12/16/2012, 9:25 AM

## 2012-12-17 DIAGNOSIS — I10 Essential (primary) hypertension: Secondary | ICD-10-CM

## 2012-12-17 DIAGNOSIS — D696 Thrombocytopenia, unspecified: Secondary | ICD-10-CM

## 2012-12-17 DIAGNOSIS — K219 Gastro-esophageal reflux disease without esophagitis: Secondary | ICD-10-CM

## 2012-12-17 DIAGNOSIS — E119 Type 2 diabetes mellitus without complications: Secondary | ICD-10-CM

## 2012-12-17 LAB — CBC
MCH: 29.4 pg (ref 26.0–34.0)
Platelets: 55 10*3/uL — ABNORMAL LOW (ref 150–400)
RBC: 2.72 MIL/uL — ABNORMAL LOW (ref 4.22–5.81)
RDW: 15.4 % (ref 11.5–15.5)
WBC: 3.7 10*3/uL — ABNORMAL LOW (ref 4.0–10.5)

## 2012-12-17 LAB — GLUCOSE, CAPILLARY: Glucose-Capillary: 195 mg/dL — ABNORMAL HIGH (ref 70–99)

## 2012-12-17 LAB — PROTIME-INR: Prothrombin Time: 21.4 seconds — ABNORMAL HIGH (ref 11.6–15.2)

## 2012-12-17 MED ORDER — BISACODYL 10 MG RE SUPP: 10.0000 mg | Freq: Every day | RECTAL | Status: DC | PRN

## 2012-12-17 MED ORDER — TRAMADOL HCL 50 MG PO TABS: 50.0000 mg | ORAL_TABLET | Freq: Four times a day (QID) | ORAL | Status: DC | PRN

## 2012-12-17 MED ORDER — SILVER SULFADIAZINE 1 % EX CREA: TOPICAL_CREAM | Freq: Every day | CUTANEOUS | Status: DC

## 2012-12-17 MED ORDER — OXYCODONE HCL 5 MG PO TABS: 5.0000 mg | ORAL_TABLET | ORAL | Status: DC | PRN

## 2012-12-17 MED ORDER — METOCLOPRAMIDE HCL 5 MG PO TABS: 5.0000 mg | ORAL_TABLET | Freq: Three times a day (TID) | ORAL | Status: DC | PRN

## 2012-12-17 MED ORDER — SILVER SULFADIAZINE 1 % EX CREA
TOPICAL_CREAM | Freq: Every day | CUTANEOUS | Status: DC
  Filled 2012-12-17: qty 85

## 2012-12-17 MED ORDER — WARFARIN SODIUM 5 MG PO TABS: 7.5000 mg | ORAL_TABLET | Freq: Every day | ORAL | Status: DC

## 2012-12-17 MED ORDER — POLYSACCHARIDE IRON COMPLEX 150 MG PO CAPS: 150.0000 mg | ORAL_CAPSULE | Freq: Two times a day (BID) | ORAL | Status: DC

## 2012-12-17 MED ORDER — DIPHENHYDRAMINE HCL 12.5 MG/5ML PO ELIX: 12.5000 mg | ORAL_SOLUTION | ORAL | Status: DC | PRN

## 2012-12-17 MED ORDER — DSS 100 MG PO CAPS: 100.0000 mg | ORAL_CAPSULE | Freq: Two times a day (BID) | ORAL | Status: DC

## 2012-12-17 MED ORDER — POLYETHYLENE GLYCOL 3350 17 G PO PACK: 17.0000 g | PACK | Freq: Every day | ORAL | Status: DC | PRN

## 2012-12-17 MED ORDER — POLYSACCHARIDE IRON COMPLEX 150 MG PO CAPS
150.0000 mg | ORAL_CAPSULE | Freq: Two times a day (BID) | ORAL | Status: DC
  Administered 2012-12-17: 150 mg via ORAL
  Filled 2012-12-17 (×3): qty 1

## 2012-12-17 MED ORDER — ONDANSETRON HCL 4 MG PO TABS: 4.0000 mg | ORAL_TABLET | Freq: Four times a day (QID) | ORAL | Status: DC | PRN

## 2012-12-17 MED ORDER — METHOCARBAMOL 500 MG PO TABS: 500.0000 mg | ORAL_TABLET | Freq: Four times a day (QID) | ORAL | Status: DC | PRN

## 2012-12-17 MED ORDER — SILVER SULFADIAZINE 1 % EX CREA
TOPICAL_CREAM | Freq: Every day | CUTANEOUS | Status: DC
  Administered 2012-12-17: 12:00:00 via TOPICAL
  Filled 2012-12-17: qty 50

## 2012-12-17 NOTE — Progress Notes (Signed)
   Subjective: 3 Days Post-Op Procedure(s) (LRB): LEFT TOTAL KNEE ARTHROPLASTY (Left) Patient reports pain as mild.   Patient seen in rounds with Dr. Wynelle Link. Patient is well, but has had some minor complaints of pain in the knee, requiring pain medications Patient is ready to go to the SNF - [facility]  Objective: Vital signs in last 24 hours: Temp:  [98.7 F (37.1 C)-100.6 F (38.1 C)] 98.7 F (37.1 C) (09/11 0618) Pulse Rate:  [79-96] 79 (09/11 0618) Resp:  [16] 16 (09/11 0618) BP: (112-130)/(68-77) 130/77 mmHg (09/11 0618) SpO2:  [95 %-97 %] 95 % (09/11 0618)  Intake/Output from previous day:  Intake/Output Summary (Last 24 hours) at 12/17/12 0836 Last data filed at 12/17/12 0155  Gross per 24 hour  Intake    350 ml  Output   2400 ml  Net  -2050 ml    Labs:  Recent Labs  12/15/12 0410 12/16/12 0413 12/17/12 0400  HGB 10.2* 8.1* 8.0*    Recent Labs  12/16/12 0413 12/17/12 0400  WBC 4.6 3.7*  RBC 2.79* 2.72*  HCT 24.1* 23.4*  PLT 61* 55*    Recent Labs  12/15/12 0410 12/16/12 0413  NA 134* 132*  K 4.7 4.1  CL 103 101  CO2 25 26  BUN 16 18  CREATININE 1.10 1.11  GLUCOSE 206* 246*  CALCIUM 8.2* 8.0*    Recent Labs  12/16/12 0413 12/17/12 0400  INR 1.43 1.92*    EXAM: General - Patient is Alert, Appropriate and Oriented Extremity - Neurovascular intact Sensation intact distally Dorsiflexion/Plantar flexion intact Incision - clean, dry, no drainage, healing Motor Function - intact, moving foot and toes well on exam.   Assessment/Plan: 3 Days Post-Op Procedure(s) (LRB): LEFT TOTAL KNEE ARTHROPLASTY (Left) Procedure(s) (LRB): LEFT TOTAL KNEE ARTHROPLASTY (Left) Past Medical History  Diagnosis Date  . Diabetes mellitus   . Hypertension   . Stroke   . Prostate hypertrophy   . Cirrhosis of liver   . Hepatic cancer    Principal Problem:   OA (osteoarthritis) of knee Active Problems:   Postoperative anemia due to acute blood  loss - Added iron   Hyponatremia - Fluids DC'd. Recheck lab on Monday at rehab.   Diabetes - sliding scale postop, resume dosage at SNF   GERD (gastroesophageal reflux disease) - resumed his protonix   Hypertension - resumed his BP medications   Thrombocytopenia, unspecified - followed postop. Initially on Lovenox but DC'd by Pharmacy postop.  Will leave off of Lovenox and recheck the labs on Monday at rehab.  Estimated body mass index is 40.04 kg/(m^2) as calculated from the following:   Height as of this encounter: 5' 9.5" (1.765 m).   Weight as of this encounter: 124.739 kg (275 lb). Up with therapy Discharge to SNF Diet - Cardiac diet, Diabetic diet and Renal diet Follow up - in 2 weeks Activity - WBAT Disposition - Skilled nursing facility Condition Upon Discharge - Stable D/C Meds - See DC Summary DVT Prophylaxis - Coumadin  Keith [name] 12/17/2012, 8:36 AM

## 2012-12-17 NOTE — Progress Notes (Addendum)
Coumadin Consult:  80 yoM on chronic Coumadin PTA for h/o DVT now s/p L TKA. Coumadin PTA was 81m daily except 7.542mon Wed/Sun.    INR today 1.92, plan discharge. MD ordered to resume PTA doses of Coumadin.  Hgb 8, Plts dropped to 55K. Pt with chronic thrombocytopenia  Plan: MD, please monitor CBC and PT/INR very closely while continuing Coumadin at discharge. Would recommend INR check in in ~2 days given INR trend while inpatient. Pharmacy will not order Coumadin tonight. RN to notify pharmacy if patient is not discharged.  ThVanessa DurhamPharmD, BCPS Pager: [phone] AM Pharmacy #: [phone]

## 2012-12-17 NOTE — Discharge Summary (Signed)
Physician Discharge Summary   Patient ID: Keith [name] MRN: [id number] DOB/AGE: [birth year] 72 y.o.  Admit date: 12/14/2012 Discharge date: 12/17/2012  Primary Diagnosis:  Osteoarthritis Left knee(s)  Admission Diagnoses:  Past Medical History  Diagnosis Date  . Diabetes mellitus   . Hypertension   . Stroke   . Prostate hypertrophy   . Cirrhosis of liver   . Hepatic cancer    Discharge Diagnoses:   Principal Problem:   OA (osteoarthritis) of knee Active Problems:   Postoperative anemia due to acute blood loss   Hyponatremia   Diabetes   GERD (gastroesophageal reflux disease)   Hypertension   Thrombocytopenia, unspecified  Estimated body mass index is 40.04 kg/(m^2) as calculated from the following:   Height as of this encounter: 5' 9.5" (1.765 m).   Weight as of this encounter: 124.739 kg (275 lb).  Procedure:  Procedure(s) (LRB): LEFT TOTAL KNEE ARTHROPLASTY (Left)   Consults: None  HPI: JACCOB CZAPLICKI is a 72 y.o. year old male with end stage OA of his left knee with progressively worsening pain and dysfunction. He has constant pain, with activity and at rest and significant functional deficits with difficulties even with ADLs. He has had extensive non-op management including analgesics, injections of cortisone and viscosupplements, and home exercise program, but remains in significant pain with significant dysfunction. Radiographs show bone on bone arthritis medial and patellofemoral. He presents now for left Total Knee Arthroplasty.   Laboratory Data: Admission on 12/14/2012  Component Date Value Range Status  . Prothrombin Time 12/14/2012 14.0  11.6 - 15.2 seconds Final  . INR 12/14/2012 1.10  0.00 - 1.49 Final  . ABO/RH(D) 12/14/2012 O NEG   Final  . Antibody Screen 12/14/2012 NEG   Final  . Sample Expiration 12/14/2012 12/17/2012   Final  . Glucose-Capillary 12/14/2012 114* 70 - 99 mg/dL Final  . Comment 1 12/14/2012 Documented in Chart   Final  .  ABO/RH(D) 12/14/2012 O NEG   Final  . Glucose-Capillary 12/14/2012 91  70 - 99 mg/dL Final  . Comment 1 12/14/2012 Notify RN   Final  . Glucose-Capillary 12/14/2012 90  70 - 99 mg/dL Final  . Comment 1 12/14/2012 Documented in Chart   Final  . Comment 2 12/14/2012 Call MD NNP PA CNM   Final  . Glucose-Capillary 12/14/2012 161* 70 - 99 mg/dL Final  . Glucose-Capillary 12/14/2012 194* 70 - 99 mg/dL Final  . WBC 12/15/2012 5.8  4.0 - 10.5 K/uL Final  . RBC 12/15/2012 3.49* 4.22 - 5.81 MIL/uL Final  . Hemoglobin 12/15/2012 10.2* 13.0 - 17.0 g/dL Final  . HCT 12/15/2012 30.2* 39.0 - 52.0 % Final  . MCV 12/15/2012 86.5  78.0 - 100.0 fL Final  . MCH 12/15/2012 29.2  26.0 - 34.0 pg Final  . MCHC 12/15/2012 33.8  30.0 - 36.0 g/dL Final  . RDW 12/15/2012 15.1  11.5 - 15.5 % Final  . Platelets 12/15/2012 90* 150 - 400 K/uL Final   Comment: SPECIMEN CHECKED FOR CLOTS                          REPEATED TO VERIFY                          PLATELET COUNT CONFIRMED BY SMEAR  . Sodium 12/15/2012 134* 135 - 145 mEq/L Final  . Potassium 12/15/2012 4.7  3.5 - 5.1 mEq/L Final  .  Chloride 12/15/2012 103  96 - 112 mEq/L Final  . CO2 12/15/2012 25  19 - 32 mEq/L Final  . Glucose, Bld 12/15/2012 206* 70 - 99 mg/dL Final  . BUN 12/15/2012 16  6 - 23 mg/dL Final  . Creatinine, Ser 12/15/2012 1.10  0.50 - 1.35 mg/dL Final  . Calcium 12/15/2012 8.2* 8.4 - 10.5 mg/dL Final  . GFR calc non Af Amer 12/15/2012 66* >90 mL/min Final  . GFR calc Af Amer 12/15/2012 77* >90 mL/min Final   Comment: (NOTE)                          The eGFR has been calculated using the CKD EPI equation.                          This calculation has not been validated in all clinical situations.                          eGFR's persistently <90 mL/min signify possible Chronic Kidney                          Disease.  Marland Kitchen Prothrombin Time 12/15/2012 15.1  11.6 - 15.2 seconds Final  . INR 12/15/2012 1.22  0.00 - 1.49 Final  . Glucose-Capillary  12/14/2012 128* 70 - 99 mg/dL Final  . Comment 1 12/14/2012 Notify RN   Final  . Glucose-Capillary 12/15/2012 178* 70 - 99 mg/dL Final  . Comment 1 12/15/2012 Notify RN   Final  . Comment 2 12/15/2012 Documented in Chart   Final  . Glucose-Capillary 12/15/2012 380* 70 - 99 mg/dL Final  . WBC 12/16/2012 4.6  4.0 - 10.5 K/uL Final  . RBC 12/16/2012 2.79* 4.22 - 5.81 MIL/uL Final  . Hemoglobin 12/16/2012 8.1* 13.0 - 17.0 g/dL Final   Comment: DELTA CHECK NOTED                          REPEATED TO VERIFY  . HCT 12/16/2012 24.1* 39.0 - 52.0 % Final  . MCV 12/16/2012 86.4  78.0 - 100.0 fL Final  . MCH 12/16/2012 29.0  26.0 - 34.0 pg Final  . MCHC 12/16/2012 33.6  30.0 - 36.0 g/dL Final  . RDW 12/16/2012 15.5  11.5 - 15.5 % Final  . Platelets 12/16/2012 61* 150 - 400 K/uL Final   Comment: SPECIMEN CHECKED FOR CLOTS                          REPEATED TO VERIFY                          DELTA CHECK NOTED                          PLATELET COUNT CONFIRMED BY SMEAR  . Sodium 12/16/2012 132* 135 - 145 mEq/L Final  . Potassium 12/16/2012 4.1  3.5 - 5.1 mEq/L Final  . Chloride 12/16/2012 101  96 - 112 mEq/L Final  . CO2 12/16/2012 26  19 - 32 mEq/L Final  . Glucose, Bld 12/16/2012 246* 70 - 99 mg/dL Final  . BUN 12/16/2012 18  6 - 23 mg/dL Final  . Creatinine, Ser 12/16/2012 1.11  0.50 -  1.35 mg/dL Final  . Calcium 12/16/2012 8.0* 8.4 - 10.5 mg/dL Final  . GFR calc non Af Amer 12/16/2012 65* >90 mL/min Final  . GFR calc Af Amer 12/16/2012 76* >90 mL/min Final   Comment: (NOTE)                          The eGFR has been calculated using the CKD EPI equation.                          This calculation has not been validated in all clinical situations.                          eGFR's persistently <90 mL/min signify possible Chronic Kidney                          Disease.  Marland Kitchen Prothrombin Time 12/16/2012 17.1* 11.6 - 15.2 seconds Final  . INR 12/16/2012 1.43  0.00 - 1.49 Final  . Glucose-Capillary  12/15/2012 241* 70 - 99 mg/dL Final  . Glucose-Capillary 12/15/2012 218* 70 - 99 mg/dL Final  . Glucose-Capillary 12/16/2012 258* 70 - 99 mg/dL Final  . Comment 1 12/16/2012 Notify RN   Final  . Glucose-Capillary 12/16/2012 281* 70 - 99 mg/dL Final  . Glucose-Capillary 12/16/2012 211* 70 - 99 mg/dL Final  . Comment 1 12/16/2012 Notify RN   Final  . WBC 12/17/2012 3.7* 4.0 - 10.5 K/uL Final  . RBC 12/17/2012 2.72* 4.22 - 5.81 MIL/uL Final  . Hemoglobin 12/17/2012 8.0* 13.0 - 17.0 g/dL Final  . HCT 12/17/2012 23.4* 39.0 - 52.0 % Final  . MCV 12/17/2012 86.0  78.0 - 100.0 fL Final  . MCH 12/17/2012 29.4  26.0 - 34.0 pg Final  . MCHC 12/17/2012 34.2  30.0 - 36.0 g/dL Final  . RDW 12/17/2012 15.4  11.5 - 15.5 % Final  . Platelets 12/17/2012 55* 150 - 400 K/uL Final   Comment: SPECIMEN CHECKED FOR CLOTS                          CONSISTENT WITH PREVIOUS RESULT  . Prothrombin Time 12/17/2012 21.4* 11.6 - 15.2 seconds Final  . INR 12/17/2012 1.92* 0.00 - 1.49 Final  . Glucose-Capillary 12/16/2012 242* 70 - 99 mg/dL Final  . Glucose-Capillary 12/17/2012 195* 70 - 99 mg/dL Final  Hospital Outpatient Visit on 12/03/2012  Component Date Value Range Status  . MRSA, PCR 12/03/2012 NEGATIVE  NEGATIVE Final  . Staphylococcus aureus 12/03/2012 NEGATIVE  NEGATIVE Final   Comment:                                 The Xpert SA Assay (FDA                          approved for NASAL specimens                          in patients over 81 years of age),                          is one component of  a comprehensive surveillance                          program.  Test performance has                          been validated by [facility] for patients greater                          than or equal to 12 year old.                          It is not intended                          to diagnose infection nor to                          guide or monitor  treatment.  Marland Kitchen aPTT 12/03/2012 35  24 - 37 seconds Final  . WBC 12/03/2012 5.0  4.0 - 10.5 K/uL Final  . RBC 12/03/2012 4.33  4.22 - 5.81 MIL/uL Final  . Hemoglobin 12/03/2012 12.6* 13.0 - 17.0 g/dL Final  . HCT 12/03/2012 37.5* 39.0 - 52.0 % Final  . MCV 12/03/2012 86.6  78.0 - 100.0 fL Final  . MCH 12/03/2012 29.1  26.0 - 34.0 pg Final  . MCHC 12/03/2012 33.6  30.0 - 36.0 g/dL Final  . RDW 12/03/2012 15.2  11.5 - 15.5 % Final  . Platelets 12/03/2012 101* 150 - 400 K/uL Final   Comment: REPEATED TO VERIFY                          SPECIMEN CHECKED FOR CLOTS                          PLATELET COUNT CONFIRMED BY SMEAR  . Sodium 12/03/2012 139  135 - 145 mEq/L Final  . Potassium 12/03/2012 4.3  3.5 - 5.1 mEq/L Final  . Chloride 12/03/2012 104  96 - 112 mEq/L Final  . CO2 12/03/2012 27  19 - 32 mEq/L Final  . Glucose, Bld 12/03/2012 83  70 - 99 mg/dL Final  . BUN 12/03/2012 20  6 - 23 mg/dL Final  . Creatinine, Ser 12/03/2012 1.17  0.50 - 1.35 mg/dL Final  . Calcium 12/03/2012 9.2  8.4 - 10.5 mg/dL Final  . Total Protein 12/03/2012 7.4  6.0 - 8.3 g/dL Final  . Albumin 12/03/2012 3.8  3.5 - 5.2 g/dL Final  . AST 12/03/2012 31  0 - 37 U/L Final  . ALT 12/03/2012 30  0 - 53 U/L Final  . Alkaline Phosphatase 12/03/2012 74  39 - 117 U/L Final  . Total Bilirubin 12/03/2012 0.4  0.3 - 1.2 mg/dL Final  . GFR calc non Af Amer 12/03/2012 61* >90 mL/min Final  . GFR calc Af Amer 12/03/2012 71* >90 mL/min Final   Comment: (NOTE)                          The eGFR has been calculated  using the CKD EPI equation.                          This calculation has not been validated in all clinical situations.                          eGFR's persistently <90 mL/min signify possible Chronic Kidney                          Disease.  Marland Kitchen Prothrombin Time 12/03/2012 24.0* 11.6 - 15.2 seconds Final  . INR 12/03/2012 2.23* 0.00 - 1.49 Final  . Color, Urine 12/03/2012 YELLOW  YELLOW Final  . APPearance 12/03/2012  CLEAR  CLEAR Final  . Specific Gravity, Urine 12/03/2012 1.022  1.005 - 1.030 Final  . pH 12/03/2012 5.0  5.0 - 8.0 Final  . Glucose, UA 12/03/2012 NEGATIVE  NEGATIVE mg/dL Final  . Hgb urine dipstick 12/03/2012 NEGATIVE  NEGATIVE Final  . Bilirubin Urine 12/03/2012 NEGATIVE  NEGATIVE Final  . Ketones, ur 12/03/2012 NEGATIVE  NEGATIVE mg/dL Final  . Protein, ur 12/03/2012 NEGATIVE  NEGATIVE mg/dL Final  . Urobilinogen, UA 12/03/2012 0.2  0.0 - 1.0 mg/dL Final  . Nitrite 12/03/2012 NEGATIVE  NEGATIVE Final  . Leukocytes, UA 12/03/2012 NEGATIVE  NEGATIVE Final   MICROSCOPIC NOT DONE ON URINES WITH NEGATIVE PROTEIN, BLOOD, LEUKOCYTES, NITRITE, OR GLUCOSE <1000 mg/dL.     X-Rays:Dg Chest 2 View  12/03/2012   *RADIOLOGY REPORT*  Clinical Data: Preop for left knee arthroplasty  CHEST - 2 VIEW  Comparison: 05/30/2009  Findings: Cardiomediastinal silhouette is stable.  No acute infiltrate or pleural effusion.  No pulmonary edema.  Mild elevation of the right hemidiaphragm.  Mild degenerative changes thoracic spine.  IMPRESSION: . No active disease.  Mild degenerative changes thoracic spine.   Original Report Authenticated By: Lahoma Crocker, M.D.    EKG: Orders placed during the hospital encounter of 12/03/12  . EKG 12-LEAD  . EKG 12-LEAD     Hospital Course: NIKOLUS MARCZAK is a 72 y.o. who was admitted to [facility]. They were brought to the operating room on 12/14/2012 and underwent Procedure(s): LEFT TOTAL KNEE ARTHROPLASTY.  Patient tolerated the procedure well and was later transferred to the recovery room and then to the orthopaedic floor for postoperative care.  They were given PO and IV analgesics for pain control following their surgery.  They were given 24 hours of postoperative antibiotics of  Anti-infectives   Start     Dose/Rate Route Frequency Ordered Stop   12/14/12 2100  ceFAZolin (ANCEF) IVPB 2 g/50 mL premix     2 g 100 mL/hr over 30 Minutes Intravenous Every 6 hours  12/14/12 1855 12/15/12 0327   12/14/12 1215  ceFAZolin (ANCEF) 3 g in dextrose 5 % 50 mL IVPB     3 g 160 mL/hr over 30 Minutes Intravenous On call to O.R. 12/14/12 1208 12/14/12 1529     and started on DVT prophylaxis in the form of Lovenox and Coumadin.   PT and OT were ordered for total joint protocol.  Discharge planning consulted to help with postop disposition and equipment needs.  Social worker was consulted to help with placement of this patient.  Patient had a tough night on the evening of surgery with lots of spasms.  They started to get up OOB with therapy on day one. Hemovac drain  was pulled without difficulty.  Continued to work with therapy into day two.  Dressing was changed on day two and the incision was healing well. He was noticed to have a drop in his platelets so the Lovenox was discontinued ph Pharmacy.  By day three, the patient had progressed with therapy and meeting their goals.  Incision was healing well.  Patient was seen in rounds and was ready to go to the skilled facility - [facility].   Discharge Medications: Prior to Admission medications   Medication Sig Start Date End Date Taking? Authorizing Provider  hydrocerin (EUCERIN) CREA Apply 1 application topically daily.   Yes Historical Provider, MD  hydrocortisone 1 % lotion Apply 1 application topically daily.   Yes Historical Provider, MD  insulin aspart (NOVOLOG) 100 UNIT/ML injection Inject 2-10 Units into the skin 3 (three) times daily before meals. BS 150-200=2 units; 201-250=4 units; 251-300= 6 units; 301-350= 8 units; 350 and greater is 10 units   Yes Historical Provider, MD  insulin glargine (LANTUS) 100 UNIT/ML injection Inject 50 Units into the skin every evening.    Yes Historical Provider, MD  loratadine (CLARITIN) 10 MG tablet Take 10 mg by mouth daily as needed for allergies.   Yes Historical Provider, MD  pantoprazole (PROTONIX) 40 MG tablet Take 40 mg by mouth daily.   Yes Historical Provider, MD    polyvinyl alcohol (LIQUIFILM TEARS) 1.4 % ophthalmic solution Place 1 drop into both eyes 2 (two) times daily as needed (dry eyes).   Yes Historical Provider, MD  pregabalin (LYRICA) 150 MG capsule Take 150 mg by mouth 2 (two) times daily.   Yes Historical Provider, MD  senna (SENOKOT) 8.6 MG TABS tablet Take 1 tablet by mouth daily as needed. Constipation   Yes Historical Provider, MD  simvastatin (ZOCOR) 20 MG tablet Take 10 mg by mouth every evening.   Yes Historical Provider, MD  spironolactone (ALDACTONE) 100 MG tablet Take 100 mg by mouth every morning.    Yes Historical Provider, MD  tamsulosin (FLOMAX) 0.4 MG CAPS Take 0.4 mg by mouth daily after supper.    Yes Historical Provider, MD  bisacodyl (DULCOLAX) 10 MG suppository Place 1 suppository (10 mg total) rectally daily as needed. 12/17/12   Nikan Ellingson Dara Lords, PA-C  diphenhydrAMINE (BENADRYL) 12.5 MG/5ML elixir Take 5-10 mLs (12.5-25 mg total) by mouth every 4 (four) hours as needed for itching. 12/17/12   Jaycie Kregel, PA-C  docusate sodium 100 MG CAPS Take 100 mg by mouth 2 (two) times daily. 12/17/12   Gasper Hopes, PA-C  fentaNYL (DURAGESIC - DOSED MCG/HR) 100 MCG/HR Place 1 patch onto the skin every 3 (three) days.    Historical Provider, MD  finasteride (PROSCAR) 5 MG tablet Take 5 mg by mouth daily.      Historical Provider, MD  furosemide (LASIX) 40 MG tablet Take 40 mg by mouth every morning.    Historical Provider, MD  iron polysaccharides (NIFEREX) 150 MG capsule Take 1 capsule (150 mg total) by mouth 2 (two) times daily. 12/17/12   Titiana Severa Dara Lords, PA-C  methocarbamol (ROBAXIN) 500 MG tablet Take 1 tablet (500 mg total) by mouth every 6 (six) hours as needed. 12/17/12   Zachariah Pavek Dara Lords, PA-C  metoCLOPramide (REGLAN) 5 MG tablet Take 1-2 tablets (5-10 mg total) by mouth every 8 (eight) hours as needed (if ondansetron (ZOFRAN) ineffective.). 12/17/12   Corry Storie, PA-C  ondansetron (ZOFRAN) 4 MG tablet  Take 1 tablet (4 mg total) by mouth every 6 (  six) hours as needed for nausea. 12/17/12   Elvina Bosch, PA-C  oxyCODONE (OXY IR/ROXICODONE) 5 MG immediate release tablet Take 1-3 tablets (5-15 mg total) by mouth every 4 (four) hours as needed. 12/17/12   Abri Vacca, PA-C  polyethylene glycol (MIRALAX / GLYCOLAX) packet Take 17 g by mouth daily as needed. 12/17/12   Matilde Markie, PA-C  silver sulfADIAZINE (SILVADENE) 1 % cream Apply topically daily. Apply to the right toe daily and cover with a dry dressing. 12/17/12   Daksh Coates, PA-C  silver sulfADIAZINE (SILVADENE) 1 % cream Apply topically daily. Apply to right toe daily and cover with a dry dressing daily. 12/17/12   Nance Mccombs, PA-C  traMADol (ULTRAM) 50 MG tablet Take 1-2 tablets (50-100 mg total) by mouth every 6 (six) hours as needed (mild pain). 12/17/12   Ravon Mcilhenny Dara Lords, PA-C  warfarin (COUMADIN) 5 MG tablet Take 1.5-2 tablets (7.5-10 mg total) by mouth daily. Take Coumadin for three weeks for postoperative protocol and then the patient may resume their previous Coumadin home regimen.  The dose may need to be adjusted based upon the INR.  Please follow the INR and titrate Coumadin dose for a therapeutic range between 2.0 and 3.0 INR.  After three weeks of Coumadin, the patient may resume their previous Coumadin home regimen and their Plavix. 12/17/12   Rosevelt Luu Dara Lords, PA-C    Diet: Cardiac diet, Diabetic diet and Renal diet Activity:WBAT Follow-up:in 2 weeks Disposition - [facility] Discharged Condition: stable   Discharge Orders   Future Orders Complete By Expires   Call MD / Call 911  As directed    Comments:     If you experience chest pain or shortness of breath, CALL 911 and be transported to the hospital emergency room.  If you develope a fever above 101 F, pus (white drainage) or increased drainage or redness at the wound, or calf pain, call your surgeon's  office.   Change dressing  As directed    Comments:     Change dressing daily with sterile 4 x 4 inch gauze dressing and apply TED hose. Do not submerge the incision under water.   Constipation Prevention  As directed    Comments:     Drink plenty of fluids.  Prune juice may be helpful.  You may use a stool softener, such as Colace (over the counter) 100 mg twice a day.  Use MiraLax (over the counter) for constipation as needed.   Diet - low sodium heart healthy  As directed    Diet Carb Modified  As directed    Diet general  As directed    Discharge instructions  As directed    Comments:     Pick up stool softner and laxative for home. Do not submerge incision under water. May shower. Continue to use ice for pain and swelling from surgery.  Take Coumadin for three weeks for postoperative protocol and then the patient may resume their previous Coumadin home regimen.  The dose may need to be adjusted based upon the INR.  Please follow the INR and titrate Coumadin dose for a therapeutic range between 2.0 and 3.0 INR.  After completing the three weeks of Coumadin, the patient may resume their previous Coumadin home regimen and their Plavix.  When discharged from the skilled rehab facility, please have the facility set up the patient's [place] prior to being released.  Also provide the patient with their medications at time  of release from the facility to include their pain medication, the muscle relaxants, and their blood thinner medication.  If the patient is still at the rehab facility at time of follow up appointment, please also assist the patient in arranging follow up appointment in our office and any transportation needs.   Do not put a pillow under the knee. Place it under the heel.  As directed    Do not sit on low chairs, stoools or toilet seats, as it may be difficult to get up from low surfaces  As directed    Driving restrictions  As directed    Comments:     No  driving until released by the physician.   Increase activity slowly as tolerated  As directed    Lifting restrictions  As directed    Comments:     No lifting until released by the physician.   Patient may shower  As directed    Comments:     You may shower without a dressing once there is no drainage.  Do not wash over the wound.  If drainage remains, do not shower until drainage stops.   TED hose  As directed    Comments:     Use stockings (TED hose) for 3 weeks on both leg(s).  You may remove them at night for sleeping.   Weight bearing as tolerated  As directed      LABS - Recheck CBC and BMET on Monday and send results to Dr. Wynelle Link at [facility]    Medication List    STOP taking these medications       clopidogrel 75 MG tablet  Commonly known as:  PLAVIX     folic acid 1 MG tablet  Commonly known as:  FOLVITE     HYDROcodone-acetaminophen 10-500 MG per tablet  Commonly known as:  LORTAB      TAKE these medications       bisacodyl 10 MG suppository  Commonly known as:  DULCOLAX  Place 1 suppository (10 mg total) rectally daily as needed.     diphenhydrAMINE 12.5 MG/5ML elixir  Commonly known as:  BENADRYL  Take 5-10 mLs (12.5-25 mg total) by mouth every 4 (four) hours as needed for itching.     DSS 100 MG Caps  Take 100 mg by mouth 2 (two) times daily.     fentaNYL 100 MCG/HR  Commonly known as:  DURAGESIC - dosed mcg/hr  Place 1 patch onto the skin every 3 (three) days.     finasteride 5 MG tablet  Commonly known as:  PROSCAR  Take 5 mg by mouth daily.     furosemide 40 MG tablet  Commonly known as:  LASIX  Take 40 mg by mouth every morning.     hydrocerin Crea  Apply 1 application topically daily.     hydrocortisone 1 % lotion  Apply 1 application topically daily.     insulin aspart 100 UNIT/ML injection  Commonly known as:  novoLOG  Inject 2-10 Units into the skin 3 (three) times daily before meals. BS 150-200=2 units; 201-250=4 units;  251-300= 6 units; 301-350= 8 units; 350 and greater is 10 units     insulin glargine 100 UNIT/ML injection  Commonly known as:  LANTUS  Inject 50 Units into the skin every evening.     iron polysaccharides 150 MG capsule  Commonly known as:  NIFEREX  Take 1 capsule (150 mg total) by mouth 2 (two) times daily.  loratadine 10 MG tablet  Commonly known as:  CLARITIN  Take 10 mg by mouth daily as needed for allergies.     methocarbamol 500 MG tablet  Commonly known as:  ROBAXIN  Take 1 tablet (500 mg total) by mouth every 6 (six) hours as needed.     metoCLOPramide 5 MG tablet  Commonly known as:  REGLAN  Take 1-2 tablets (5-10 mg total) by mouth every 8 (eight) hours as needed (if ondansetron (ZOFRAN) ineffective.).     ondansetron 4 MG tablet  Commonly known as:  ZOFRAN  Take 1 tablet (4 mg total) by mouth every 6 (six) hours as needed for nausea.     oxyCODONE 5 MG immediate release tablet  Commonly known as:  Oxy IR/ROXICODONE  Take 1-3 tablets (5-15 mg total) by mouth every 4 (four) hours as needed.     pantoprazole 40 MG tablet  Commonly known as:  PROTONIX  Take 40 mg by mouth daily.     polyethylene glycol packet  Commonly known as:  MIRALAX / GLYCOLAX  Take 17 g by mouth daily as needed.     polyvinyl alcohol 1.4 % ophthalmic solution  Commonly known as:  LIQUIFILM TEARS  Place 1 drop into both eyes 2 (two) times daily as needed (dry eyes).     pregabalin 150 MG capsule  Commonly known as:  LYRICA  Take 150 mg by mouth 2 (two) times daily.     senna 8.6 MG Tabs tablet  Commonly known as:  SENOKOT  Take 1 tablet by mouth daily as needed. Constipation     silver sulfADIAZINE 1 % cream  Commonly known as:  SILVADENE  Apply topically daily. Apply to the right toe daily and cover with a dry dressing.     silver sulfADIAZINE 1 % cream  Commonly known as:  SILVADENE  Apply topically daily. Apply to right toe daily and cover with a dry dressing daily.      simvastatin 20 MG tablet  Commonly known as:  ZOCOR  Take 10 mg by mouth every evening.     spironolactone 100 MG tablet  Commonly known as:  ALDACTONE  Take 100 mg by mouth every morning.     tamsulosin 0.4 MG Caps capsule  Commonly known as:  FLOMAX  Take 0.4 mg by mouth daily after supper.     traMADol 50 MG tablet  Commonly known as:  ULTRAM  Take 1-2 tablets (50-100 mg total) by mouth every 6 (six) hours as needed (mild pain).     warfarin 5 MG tablet  Commonly known as:  COUMADIN  Take 1.5-2 tablets (7.5-10 mg total) by mouth daily. Take Coumadin for three weeks for postoperative protocol and then the patient may resume their previous Coumadin home regimen.  The dose may need to be adjusted based upon the INR.  Please follow the INR and titrate Coumadin dose for a therapeutic range between 2.0 and 3.0 INR.  After three weeks of Coumadin, the patient may resume their previous Coumadin home regimen and their Plavix.           Follow-up Information   Follow up with Gearlean Alf, MD. Schedule an appointment as soon as possible for a visit on 12/29/2012. (Call [phone] tomorrow to make the appointment)    Specialty:  Orthopedic Surgery   Contact information:   [address] [place] [address] [phone]       Signed: Mickel Crow 12/17/2012, 8:54 AM

## 2012-12-17 NOTE — Progress Notes (Signed)
1115 report called to [facility] supervisor Genia Hotter took report. Patient to be transported by Lamount Cohen RN

## 2012-12-18 NOTE — Progress Notes (Signed)
Clinical Social Work Department CLINICAL SOCIAL WORK PLACEMENT NOTE 12/18/2012  Patient:  Keith [name], Keith [name]  Account Number:  [id number] Admit date:  12/14/2012  Clinical Social Worker:  Werner Lean, LCSW  Date/time:  12/15/2012 12:16 PM  Clinical Social Work is seeking post-discharge placement for this patient at the following level of care:   SKILLED NURSING   (*CSW will update this form in Epic as items are completed)   12/15/2012  Patient/family provided with [facility] Department of Clinical Social Work's list of facilities offering this level of care within the geographic area requested by the patient (or if unable, by the patient's family).  12/15/2012  Patient/family informed of their freedom to choose among providers that offer the needed level of care, that participate in Medicare, Medicaid or managed care program needed by the patient, have an available bed and are willing to accept the patient.    Patient/family informed of [facility]' ownership interest in [facility], as well as of the fact that they are under no obligation to receive care at this facility.  PASARR submitted to EDS on 12/15/2012 PASARR number received from EDS on 12/15/2012  FL2 transmitted to all facilities in geographic area requested by pt/family on  12/15/2012 FL2 transmitted to all facilities within larger geographic area on   Patient informed that his/her managed care company has contracts with or will negotiate with  certain facilities, including the following:     Patient/family informed of bed offers received:  12/16/2012 Patient chooses bed at [facility] Physician recommends and patient chooses bed at    Patient to be transferred to [facility] on  12/17/2012 Patient to be transferred to facility by   The following physician request were entered in Epic:   Additional Comments: [facility] provided auth for SNF and ambulance transort.  Werner Lean LCSW  [phone]

## 2012-12-21 ENCOUNTER — Non-Acute Institutional Stay (SKILLED_NURSING_FACILITY): Payer: Medicare Other | Admitting: Adult Health

## 2012-12-21 DIAGNOSIS — G629 Polyneuropathy, unspecified: Secondary | ICD-10-CM

## 2012-12-21 DIAGNOSIS — E119 Type 2 diabetes mellitus without complications: Secondary | ICD-10-CM

## 2012-12-21 DIAGNOSIS — E785 Hyperlipidemia, unspecified: Secondary | ICD-10-CM

## 2012-12-21 DIAGNOSIS — K219 Gastro-esophageal reflux disease without esophagitis: Secondary | ICD-10-CM

## 2012-12-21 DIAGNOSIS — M1710 Unilateral primary osteoarthritis, unspecified knee: Secondary | ICD-10-CM

## 2012-12-21 DIAGNOSIS — I10 Essential (primary) hypertension: Secondary | ICD-10-CM

## 2012-12-21 DIAGNOSIS — G589 Mononeuropathy, unspecified: Secondary | ICD-10-CM

## 2012-12-21 DIAGNOSIS — K5900 Constipation, unspecified: Secondary | ICD-10-CM

## 2012-12-21 DIAGNOSIS — D62 Acute posthemorrhagic anemia: Secondary | ICD-10-CM

## 2012-12-21 DIAGNOSIS — N400 Enlarged prostate without lower urinary tract symptoms: Secondary | ICD-10-CM

## 2012-12-21 DIAGNOSIS — IMO0002 Reserved for concepts with insufficient information to code with codable children: Secondary | ICD-10-CM

## 2012-12-22 ENCOUNTER — Encounter: Payer: Self-pay | Admitting: Adult Health

## 2012-12-22 DIAGNOSIS — G629 Polyneuropathy, unspecified: Secondary | ICD-10-CM | POA: Insufficient documentation

## 2012-12-22 DIAGNOSIS — N400 Enlarged prostate without lower urinary tract symptoms: Secondary | ICD-10-CM | POA: Insufficient documentation

## 2012-12-22 DIAGNOSIS — E785 Hyperlipidemia, unspecified: Secondary | ICD-10-CM | POA: Insufficient documentation

## 2012-12-22 DIAGNOSIS — K5900 Constipation, unspecified: Secondary | ICD-10-CM | POA: Insufficient documentation

## 2012-12-22 DIAGNOSIS — D62 Acute posthemorrhagic anemia: Secondary | ICD-10-CM | POA: Insufficient documentation

## 2012-12-22 NOTE — Progress Notes (Signed)
Patient ID: Keith [name], male   DOB: 29-Jun-1940, 72 y.o.   MRN: [id number]       PROGRESS NOTE  DATE: 12/21/2012  FACILITY:  [facility]  LEVEL OF CARE: SNF (31)  Acute Visit  CHIEF COMPLAINT:  Follow-up hospitalization  HISTORY OF PRESENT ILLNESS: This is a 72 year old male who is being admitted to then placed on 12/17/12 from [facility] with osteoarthritis status post left total knee arthroplasty. She has been admitted for a short-term rehabilitation the dictation.  PAST MEDICAL HISTORY : Reviewed.  No changes.  CURRENT MEDICATIONS: Reviewed per [facility]  REVIEW OF SYSTEMS:  GENERAL: no change in appetite, no fatigue, no weight changes, no fever, chills or weakness RESPIRATORY: no cough, SOB, DOE, wheezing, hemoptysis CARDIAC: no chest pain, edema or palpitations GI: no abdominal pain, diarrhea, constipation, heart burn, nausea or vomiting  PHYSICAL EXAMINATION  VS:  T 98.2        P 87       RR 20       BP 120/68             WT 286.6 (Lb)  GENERAL: no acute distress, normal body habitus EYES: conjunctivae normal, sclerae normal, normal eye lids NECK: supple, trachea midline, no neck masses, no thyroid tenderness, no thyromegaly LYMPHATICS: no LAN in the neck, no supraclavicular LAN RESPIRATORY: breathing is even & unlabored, BS CTAB CARDIAC: RRR, + murmur, no edema GI: abdomen soft, normal BS, no masses, no tenderness, no hepatomegaly, no splenomegaly PSYCHIATRIC: the patient is alert & oriented to person, affect & behavior appropriate  LABS/RADIOLOGY: 12/16/12 WBC 4.6 hemoglobin 8.1 hematocrit 24.1 12/15/12 WBC 5.8 hemoglobin 10.2 hematocrit 30.2 sodium 134 potassium 4.7 glucose 206 BUN 16 creatinine 1.10 calcium 8.2  ASSESSMENT/PLAN:  Osteoarthritis status post left total knee arthroplasty - for PT and OT  Diabetes mellitus, type II - well controlled  Neuropathy - stable  Prostate hypertrophy - stable  Acute blood loss anemia -  stable; continue Niferrex  Hyperlipidemia - continue simvastatin   Hypertension -  Well controlled  GERD - stable  Constipation - no complaints   CPT CODE: 64383

## 2012-12-23 ENCOUNTER — Non-Acute Institutional Stay (SKILLED_NURSING_FACILITY): Payer: Medicare Other | Admitting: Internal Medicine

## 2012-12-23 DIAGNOSIS — D62 Acute posthemorrhagic anemia: Secondary | ICD-10-CM

## 2012-12-23 DIAGNOSIS — IMO0002 Reserved for concepts with insufficient information to code with codable children: Secondary | ICD-10-CM

## 2012-12-23 DIAGNOSIS — M25569 Pain in unspecified knee: Secondary | ICD-10-CM

## 2012-12-23 DIAGNOSIS — E119 Type 2 diabetes mellitus without complications: Secondary | ICD-10-CM

## 2012-12-23 DIAGNOSIS — M25562 Pain in left knee: Secondary | ICD-10-CM

## 2012-12-23 DIAGNOSIS — M1710 Unilateral primary osteoarthritis, unspecified knee: Secondary | ICD-10-CM

## 2012-12-24 ENCOUNTER — Other Ambulatory Visit: Payer: Self-pay | Admitting: *Deleted

## 2012-12-24 MED ORDER — OXYCODONE HCL ER 10 MG PO T12A: EXTENDED_RELEASE_TABLET | ORAL | Status: DC

## 2013-01-05 ENCOUNTER — Non-Acute Institutional Stay (SKILLED_NURSING_FACILITY): Payer: Medicare Other | Admitting: Adult Health

## 2013-01-05 DIAGNOSIS — D62 Acute posthemorrhagic anemia: Secondary | ICD-10-CM

## 2013-01-05 DIAGNOSIS — E119 Type 2 diabetes mellitus without complications: Secondary | ICD-10-CM

## 2013-01-05 DIAGNOSIS — M179 Osteoarthritis of knee, unspecified: Secondary | ICD-10-CM

## 2013-01-05 DIAGNOSIS — M1710 Unilateral primary osteoarthritis, unspecified knee: Secondary | ICD-10-CM

## 2013-01-05 DIAGNOSIS — G589 Mononeuropathy, unspecified: Secondary | ICD-10-CM

## 2013-01-05 DIAGNOSIS — K5900 Constipation, unspecified: Secondary | ICD-10-CM

## 2013-01-05 DIAGNOSIS — I10 Essential (primary) hypertension: Secondary | ICD-10-CM

## 2013-01-05 DIAGNOSIS — K219 Gastro-esophageal reflux disease without esophagitis: Secondary | ICD-10-CM

## 2013-01-05 DIAGNOSIS — IMO0002 Reserved for concepts with insufficient information to code with codable children: Secondary | ICD-10-CM

## 2013-01-05 DIAGNOSIS — N400 Enlarged prostate without lower urinary tract symptoms: Secondary | ICD-10-CM

## 2013-01-05 DIAGNOSIS — G629 Polyneuropathy, unspecified: Secondary | ICD-10-CM

## 2013-01-05 DIAGNOSIS — E785 Hyperlipidemia, unspecified: Secondary | ICD-10-CM

## 2013-01-26 DIAGNOSIS — M25569 Pain in unspecified knee: Secondary | ICD-10-CM | POA: Insufficient documentation

## 2013-01-26 NOTE — Progress Notes (Signed)
Patient ID: Keith [name], male   DOB: March 22, 1941, 72 y.o.   MRN: [id number]        HISTORY & PHYSICAL  DATE: 12/23/2012   FACILITY: [facility]  LEVEL OF CARE: SNF (31)  ALLERGIES:  Allergies  Allergen Reactions  . Iodine Rash    CHIEF COMPLAINT:  Manage left knee osteoarthritis, acute blood loss anemia, and diabetes mellitus.    HISTORY OF PRESENT ILLNESS:  The patient is a 72 year-old, Caucasian male.    KNEE OSTEOARTHRITIS: Patient had a history of pain and functional disability in the knee due to end-stage osteoarthritis and has failed nonsurgical conservative treatments. Patient had worsening of pain with activity and weight bearing, pain that interfered with activities of daily living & pain with passive range of motion. Radiographs showed bone-on-bone arthritis in the medial and patellofemoral compartment.  Therefore patient underwent total knee arthroplasty and tolerated the procedure well. Patient is admitted to this facility for sort short-term rehabilitation. Patient complains of left knee pain.    ANEMIA: Postoperatively, patient suffered acute blood loss.   The anemia has been stable. The patient denies fatigue, melena or hematochezia. No complications from the medications currently being used.  Last hemoglobins are:    10.2, 8.1, 8, 12.6.    DM:pt's DM remains stable.  Pt denies polyuria, polydipsia, polyphagia, changes in vision or hypoglycemic episodes.  No complications noted from the medication presently being used.  Last hemoglobin A1c is:    Not available.    PAST MEDICAL HISTORY :  Past Medical History  Diagnosis Date  . Diabetes mellitus   . Hypertension   . Stroke   . Prostate hypertrophy   . Cirrhosis of liver   . Hepatic cancer     PAST SURGICAL HISTORY: Past Surgical History  Procedure Laterality Date  . Liver resection    . Tonsillectomy    . Eye surgery      bilateral cataract   . Appendectomy    . Left arm surgery     age 52  . Left hand surgery      tendons ripped on left hand  . Total knee arthroplasty Left 12/14/2012    Procedure: LEFT TOTAL KNEE ARTHROPLASTY;  Surgeon: Gearlean Alf, MD;  Location: [facility];  Service: Orthopedics;  Laterality: Left;    SOCIAL HISTORY:  reports that he has quit smoking. He quit smokeless tobacco use about 29 years ago. He reports that he does not drink alcohol or use illicit drugs.  FAMILY HISTORY:  Family History  Problem Relation Age of Onset  . Stroke Father     CURRENT MEDICATIONS: Reviewed per [facility]  REVIEW OF SYSTEMS:  See HPI otherwise 14 point ROS is negative.  PHYSICAL EXAMINATION  VS:  T 98       P 79      RR 20      BP 138/79      POX%        WT (Lb)  GENERAL: no acute distress, morbidly obese body habitus SKIN:  INSPECTION: bilateral lower extremities have chronic venous insufficiency changes    EYES: conjunctivae normal, sclerae normal, normal eye lids MOUTH/THROAT: lips without lesions,no lesions in the mouth,tongue is without lesions,uvula elevates in midline NECK: supple, trachea midline, no neck masses, no thyroid tenderness, no thyromegaly LYMPHATICS: no LAN in the neck, no supraclavicular LAN RESPIRATORY: breathing is even & unlabored, BS CTAB CARDIAC: RRR, no murmur,no extra heart sounds EDEMA/VARICOSITIES:  +3  bilateral lower extremity edema  ARTERIAL:  pedal pulses +1 bilaterally   GI:  ABDOMEN: abdomen soft, normal BS, no masses, no tenderness  LIVER/SPLEEN: no hepatomegaly, no splenomegaly MUSCULOSKELETAL: HEAD: normal to inspection & palpation BACK: no kyphosis, scoliosis or spinal processes tenderness EXTREMITIES: LEFT UPPER EXTREMITY: full range of motion, normal strength & tone RIGHT UPPER EXTREMITY:  full range of motion, normal strength & tone LEFT LOWER EXTREMITY: strength intact, range of motion not tested due to surgery   RIGHT LOWER EXTREMITY:  full range of motion, normal strength & tone PSYCHIATRIC: the patient is  alert & oriented to person, affect & behavior appropriate  LABS/RADIOLOGY: PT 14, INR 1.1, PTT 35.    Hemoglobin 10.2, MCV 86.5, platelets 90, WBC 5.8.    Glucose 206, otherwise BMP normal.     MRSA by PCR negative.     Staph aureus by PCR negative.    Liver profile normal.    Urinalysis negative.    Chest x-ray:  No acute disease.    ASSESSMENT/PLAN:  Left knee osteoarthritis.  Status post left total knee arthroplasty.  Continue rehabilitation.    Acute blood loss anemia.  Continue iron.  Reassess hemoglobin level.    Diabetes mellitus.  Continue current medications.    Left knee pain.  Uncontrolled problem.  Add OxyContin 10 mg q.12.    GERD.  Well controlled.     BPH.  Continue current medications.    Check CBC and BMP.    I have reviewed patient's medical records received at admission/from hospitalization.  CPT CODE: 51761

## 2013-01-26 NOTE — Progress Notes (Signed)
Patient ID: Keith [name], male   DOB: 02/16/1941, 72 y.o.   MRN: [id number]       PROGRESS NOTE  DATE: 01/05/2013   FACILITY: [facility]  LEVEL OF CARE: SNF (31)  Acute Visit  CHIEF COMPLAINT:  Discharge Notes  HISTORY OF PRESENT ILLNESS: This is a 72 year old male who is for discharge home with Home health PT, OT and Nursing. He has been admitted to [facility] on 12/17/12 from [facility] with osteoarthritis status post left total knee arthroplasty. Patient was admitted to this facility for short-term rehabilitation after the patient's recent hospitalization.  Patient has completed SNF rehabilitation and therapy has cleared the patient for discharge.  Reassessment of ongoing problem(s):  PERIPHERAL NEUROPATHY: The peripheral neuropathy is stable. The patient denies pain in the feet, tingling, and numbness. No complications noted from the medication presently being used.  DM:pt's DM remains stable.  Pt denies polyuria, polydipsia, polyphagia, changes in vision or hypoglycemic episodes.  No complications noted from the medication presently being used.   9/14 hemoglobin A1c  6.5  HTN: Pt 's HTN remains stable.  Denies CP, sob, DOE, pedal edema, headaches, dizziness or visual disturbances.  No complications from the medications currently being used.  Last BP : 113/66   PAST MEDICAL HISTORY : Reviewed.  No changes.  CURRENT MEDICATIONS: Reviewed per [facility]  REVIEW OF SYSTEMS:  GENERAL: no change in appetite, no fatigue, no weight changes, no fever, chills or weakness RESPIRATORY: no cough, SOB, DOE, wheezing, hemoptysis CARDIAC: no chest pain, edema or palpitations GI: no abdominal pain, diarrhea, constipation, heart burn, nausea or vomiting  PHYSICAL EXAMINATION  VS:  T98.1       P82       RR19      BP113/66      POX %       WT276.2 (Lb)  GENERAL: no acute distress, normal body habitus EYES: conjunctivae normal, sclerae normal, normal eye  lids NECK: supple, trachea midline, no neck masses, no thyroid tenderness, no thyromegaly RESPIRATORY: breathing is even & unlabored, BS CTAB CARDIAC: RRR, no murmur,no extra heart sounds, no edema GI: abdomen soft, normal BS, no masses, no tenderness, no hepatomegaly, no splenomegaly PSYCHIATRIC: the patient is alert & oriented to person, affect & behavior appropriate  LABS/RADIOLOGY: 12/16/12 WBC 4.6 hemoglobin 8.1 hematocrit 24.1 12/15/12 WBC 5.8 hemoglobin 10.2 hematocrit 30.2 sodium 134 potassium 4.7 glucose 206 BUN 16 creatinine 1.10 calcium 8.2   ASSESSMENT/PLAN:  Osteoarthritis status post left total knee arthroplasty - for Home health PT, Nursing and OT  Diabetes mellitus, type II - well controlled  Neuropathy - stable  Prostate hypertrophy - stable  Acute blood loss anemia - stable; continue Niferrex  Hyperlipidemia - continue simvastatin   Hypertension -  Well controlled  GERD - stable  Constipation - no complaints  Long term use of anticoagulants - INR 2.0  - therapeutic; continue Coumadin 9 mg by mouth daily; INR on 01/12/13  I have filled out patient's discharge paperwork and written prescriptions.  Patient will receive home health PT, OT and Nursing.  Total discharge time: Less than 30 minutes Discharge time involved coordination of the discharge process with Education officer, museum, nursing staff and therapy department. Medical justification for home health services verified.  CPT CODE: 59977

## 2013-02-09 ENCOUNTER — Ambulatory Visit: Payer: Medicare Other | Attending: Orthopedic Surgery | Admitting: Physical Therapy

## 2013-02-09 DIAGNOSIS — M25569 Pain in unspecified knee: Secondary | ICD-10-CM | POA: Insufficient documentation

## 2013-02-09 DIAGNOSIS — R609 Edema, unspecified: Secondary | ICD-10-CM | POA: Insufficient documentation

## 2013-02-09 DIAGNOSIS — M25669 Stiffness of unspecified knee, not elsewhere classified: Secondary | ICD-10-CM | POA: Insufficient documentation

## 2013-02-09 DIAGNOSIS — IMO0001 Reserved for inherently not codable concepts without codable children: Secondary | ICD-10-CM | POA: Insufficient documentation

## 2013-02-11 ENCOUNTER — Ambulatory Visit: Payer: Medicare Other | Admitting: Physical Therapy

## 2013-02-12 ENCOUNTER — Ambulatory Visit: Payer: Medicare Other | Admitting: Physical Therapy

## 2013-02-16 ENCOUNTER — Ambulatory Visit: Payer: Medicare Other | Admitting: Physical Therapy

## 2013-02-18 ENCOUNTER — Ambulatory Visit: Payer: Medicare Other | Admitting: Physical Therapy

## 2013-02-19 ENCOUNTER — Encounter: Payer: Medicare Other | Admitting: Physical Therapy

## 2013-02-23 ENCOUNTER — Ambulatory Visit: Payer: Medicare Other | Admitting: Physical Therapy

## 2013-02-25 ENCOUNTER — Ambulatory Visit: Payer: Medicare Other | Admitting: Physical Therapy

## 2013-03-02 ENCOUNTER — Ambulatory Visit: Payer: Medicare Other | Admitting: Physical Therapy

## 2013-03-09 ENCOUNTER — Ambulatory Visit: Payer: Medicare Other | Attending: Orthopedic Surgery | Admitting: Physical Therapy

## 2013-03-09 DIAGNOSIS — IMO0001 Reserved for inherently not codable concepts without codable children: Secondary | ICD-10-CM | POA: Insufficient documentation

## 2013-03-09 DIAGNOSIS — M25569 Pain in unspecified knee: Secondary | ICD-10-CM | POA: Insufficient documentation

## 2013-03-09 DIAGNOSIS — M25669 Stiffness of unspecified knee, not elsewhere classified: Secondary | ICD-10-CM | POA: Insufficient documentation

## 2013-03-09 DIAGNOSIS — R609 Edema, unspecified: Secondary | ICD-10-CM | POA: Insufficient documentation

## 2013-03-11 ENCOUNTER — Ambulatory Visit: Payer: Medicare Other | Admitting: Physical Therapy

## 2013-03-16 ENCOUNTER — Ambulatory Visit: Payer: Medicare Other | Admitting: Physical Therapy

## 2013-03-18 ENCOUNTER — Ambulatory Visit: Payer: Medicare Other | Admitting: Physical Therapy

## 2013-03-23 ENCOUNTER — Ambulatory Visit: Payer: Medicare Other | Admitting: Physical Therapy

## 2013-03-25 ENCOUNTER — Ambulatory Visit: Payer: Medicare Other | Admitting: Physical Therapy

## 2013-03-29 ENCOUNTER — Ambulatory Visit: Payer: Medicare Other | Admitting: Physical Therapy

## 2013-03-31 ENCOUNTER — Ambulatory Visit: Payer: Medicare Other | Admitting: Physical Therapy

## 2013-04-05 ENCOUNTER — Ambulatory Visit: Payer: Medicare Other | Admitting: Physical Therapy

## 2013-04-09 ENCOUNTER — Ambulatory Visit: Payer: Medicare Other | Attending: Orthopedic Surgery | Admitting: Physical Therapy

## 2013-04-09 DIAGNOSIS — IMO0001 Reserved for inherently not codable concepts without codable children: Secondary | ICD-10-CM | POA: Insufficient documentation

## 2013-04-09 DIAGNOSIS — M25669 Stiffness of unspecified knee, not elsewhere classified: Secondary | ICD-10-CM | POA: Insufficient documentation

## 2013-04-09 DIAGNOSIS — M25569 Pain in unspecified knee: Secondary | ICD-10-CM | POA: Insufficient documentation

## 2013-04-09 DIAGNOSIS — R609 Edema, unspecified: Secondary | ICD-10-CM | POA: Insufficient documentation

## 2013-04-12 ENCOUNTER — Ambulatory Visit: Payer: Medicare Other | Admitting: Physical Therapy

## 2013-04-15 ENCOUNTER — Ambulatory Visit: Payer: Medicare Other | Admitting: Physical Therapy

## 2013-04-16 ENCOUNTER — Ambulatory Visit: Payer: Medicare Other | Admitting: Physical Therapy

## 2013-04-21 ENCOUNTER — Ambulatory Visit: Payer: Medicare Other | Admitting: Physical Therapy

## 2013-04-22 ENCOUNTER — Ambulatory Visit: Payer: Medicare Other | Admitting: Physical Therapy

## 2013-04-23 ENCOUNTER — Ambulatory Visit: Payer: Medicare Other | Admitting: Physical Therapy

## 2013-04-27 ENCOUNTER — Ambulatory Visit: Payer: Medicare Other | Admitting: Physical Therapy

## 2013-04-29 ENCOUNTER — Ambulatory Visit: Payer: Medicare Other | Admitting: Physical Therapy

## 2013-05-04 ENCOUNTER — Ambulatory Visit: Payer: Medicare Other | Admitting: Physical Therapy

## 2013-05-06 ENCOUNTER — Ambulatory Visit: Payer: Medicare Other | Admitting: Physical Therapy

## 2013-05-07 ENCOUNTER — Ambulatory Visit: Payer: Medicare Other | Admitting: Physical Therapy

## 2013-05-10 ENCOUNTER — Ambulatory Visit: Payer: Medicare Other | Attending: Orthopedic Surgery | Admitting: Physical Therapy

## 2013-05-10 DIAGNOSIS — M25569 Pain in unspecified knee: Secondary | ICD-10-CM | POA: Insufficient documentation

## 2013-05-10 DIAGNOSIS — IMO0001 Reserved for inherently not codable concepts without codable children: Secondary | ICD-10-CM | POA: Insufficient documentation

## 2013-05-10 DIAGNOSIS — R609 Edema, unspecified: Secondary | ICD-10-CM | POA: Insufficient documentation

## 2013-05-10 DIAGNOSIS — M25669 Stiffness of unspecified knee, not elsewhere classified: Secondary | ICD-10-CM | POA: Insufficient documentation

## 2013-05-13 ENCOUNTER — Ambulatory Visit: Payer: Medicare Other | Admitting: Physical Therapy

## 2013-09-07 ENCOUNTER — Ambulatory Visit: Payer: Medicare Other | Admitting: Physical Therapy

## 2013-09-15 ENCOUNTER — Ambulatory Visit: Payer: Medicare Other | Attending: Orthopedic Surgery | Admitting: Physical Therapy

## 2013-09-15 DIAGNOSIS — M25619 Stiffness of unspecified shoulder, not elsewhere classified: Secondary | ICD-10-CM | POA: Insufficient documentation

## 2013-09-15 DIAGNOSIS — M25519 Pain in unspecified shoulder: Secondary | ICD-10-CM | POA: Insufficient documentation

## 2013-09-15 DIAGNOSIS — IMO0001 Reserved for inherently not codable concepts without codable children: Secondary | ICD-10-CM | POA: Insufficient documentation

## 2013-09-21 ENCOUNTER — Ambulatory Visit: Payer: Medicare Other | Admitting: Physical Therapy

## 2013-09-28 ENCOUNTER — Ambulatory Visit: Payer: Medicare Other | Admitting: Physical Therapy

## 2013-10-04 ENCOUNTER — Ambulatory Visit: Payer: Medicare Other | Admitting: Physical Therapy

## 2013-10-12 ENCOUNTER — Ambulatory Visit: Payer: Medicare Other | Attending: Orthopedic Surgery | Admitting: Physical Therapy

## 2013-10-12 DIAGNOSIS — IMO0001 Reserved for inherently not codable concepts without codable children: Secondary | ICD-10-CM | POA: Diagnosis present

## 2013-10-12 DIAGNOSIS — M25619 Stiffness of unspecified shoulder, not elsewhere classified: Secondary | ICD-10-CM | POA: Diagnosis not present

## 2013-10-12 DIAGNOSIS — M25519 Pain in unspecified shoulder: Secondary | ICD-10-CM | POA: Insufficient documentation

## 2013-10-15 ENCOUNTER — Ambulatory Visit: Payer: Medicare Other | Admitting: Physical Therapy

## 2013-10-15 DIAGNOSIS — IMO0001 Reserved for inherently not codable concepts without codable children: Secondary | ICD-10-CM | POA: Diagnosis not present

## 2013-10-19 ENCOUNTER — Ambulatory Visit: Payer: Medicare Other | Admitting: Physical Therapy

## 2013-10-22 ENCOUNTER — Ambulatory Visit: Payer: Medicare Other | Admitting: Physical Therapy

## 2013-10-22 DIAGNOSIS — IMO0001 Reserved for inherently not codable concepts without codable children: Secondary | ICD-10-CM | POA: Diagnosis not present

## 2013-10-26 ENCOUNTER — Ambulatory Visit: Payer: Medicare Other | Admitting: Physical Therapy

## 2013-10-28 ENCOUNTER — Ambulatory Visit: Payer: Medicare Other | Admitting: Physical Therapy

## 2013-10-28 DIAGNOSIS — IMO0001 Reserved for inherently not codable concepts without codable children: Secondary | ICD-10-CM | POA: Diagnosis not present

## 2013-11-03 ENCOUNTER — Ambulatory Visit: Payer: Medicare Other | Admitting: Physical Therapy

## 2013-11-03 DIAGNOSIS — IMO0001 Reserved for inherently not codable concepts without codable children: Secondary | ICD-10-CM | POA: Diagnosis not present

## 2013-11-08 ENCOUNTER — Ambulatory Visit: Payer: Medicare Other | Attending: Orthopedic Surgery | Admitting: Physical Therapy

## 2013-11-08 DIAGNOSIS — M25519 Pain in unspecified shoulder: Secondary | ICD-10-CM | POA: Diagnosis not present

## 2013-11-08 DIAGNOSIS — M25619 Stiffness of unspecified shoulder, not elsewhere classified: Secondary | ICD-10-CM | POA: Diagnosis not present

## 2013-11-08 DIAGNOSIS — IMO0001 Reserved for inherently not codable concepts without codable children: Secondary | ICD-10-CM | POA: Diagnosis present

## 2013-11-10 ENCOUNTER — Ambulatory Visit: Payer: Medicare Other | Admitting: Physical Therapy

## 2013-11-11 ENCOUNTER — Ambulatory Visit: Payer: Medicare Other | Admitting: Physical Therapy

## 2013-11-11 DIAGNOSIS — IMO0001 Reserved for inherently not codable concepts without codable children: Secondary | ICD-10-CM | POA: Diagnosis not present

## 2013-11-18 ENCOUNTER — Ambulatory Visit: Payer: Medicare Other | Admitting: Physical Therapy

## 2013-11-18 DIAGNOSIS — IMO0001 Reserved for inherently not codable concepts without codable children: Secondary | ICD-10-CM | POA: Diagnosis not present

## 2013-11-22 ENCOUNTER — Ambulatory Visit: Payer: Medicare Other | Admitting: Physical Therapy

## 2013-11-22 DIAGNOSIS — IMO0001 Reserved for inherently not codable concepts without codable children: Secondary | ICD-10-CM | POA: Diagnosis not present

## 2013-11-24 ENCOUNTER — Ambulatory Visit: Payer: Medicare Other | Admitting: Physical Therapy

## 2013-11-24 DIAGNOSIS — IMO0001 Reserved for inherently not codable concepts without codable children: Secondary | ICD-10-CM | POA: Diagnosis not present

## 2013-11-29 ENCOUNTER — Ambulatory Visit: Payer: Medicare Other | Admitting: Physical Therapy

## 2013-11-29 DIAGNOSIS — IMO0001 Reserved for inherently not codable concepts without codable children: Secondary | ICD-10-CM | POA: Diagnosis not present

## 2013-12-01 ENCOUNTER — Ambulatory Visit: Payer: Medicare Other | Admitting: Physical Therapy

## 2013-12-03 ENCOUNTER — Ambulatory Visit: Payer: Medicare Other | Admitting: Physical Therapy

## 2013-12-03 DIAGNOSIS — IMO0001 Reserved for inherently not codable concepts without codable children: Secondary | ICD-10-CM | POA: Diagnosis not present

## 2013-12-06 ENCOUNTER — Ambulatory Visit: Payer: Medicare Other | Admitting: Physical Therapy

## 2013-12-08 ENCOUNTER — Ambulatory Visit: Payer: Medicare Other | Admitting: Physical Therapy

## 2013-12-09 ENCOUNTER — Ambulatory Visit: Payer: Medicare Other | Attending: Orthopedic Surgery | Admitting: Physical Therapy

## 2013-12-09 DIAGNOSIS — IMO0001 Reserved for inherently not codable concepts without codable children: Secondary | ICD-10-CM | POA: Insufficient documentation

## 2013-12-09 DIAGNOSIS — M25519 Pain in unspecified shoulder: Secondary | ICD-10-CM | POA: Insufficient documentation

## 2013-12-09 DIAGNOSIS — M25619 Stiffness of unspecified shoulder, not elsewhere classified: Secondary | ICD-10-CM | POA: Insufficient documentation

## 2013-12-15 ENCOUNTER — Ambulatory Visit: Payer: Medicare Other | Admitting: Physical Therapy

## 2013-12-15 DIAGNOSIS — IMO0001 Reserved for inherently not codable concepts without codable children: Secondary | ICD-10-CM | POA: Diagnosis not present

## 2013-12-17 ENCOUNTER — Ambulatory Visit: Payer: Medicare Other | Admitting: Physical Therapy

## 2013-12-17 DIAGNOSIS — IMO0001 Reserved for inherently not codable concepts without codable children: Secondary | ICD-10-CM | POA: Diagnosis not present

## 2013-12-21 ENCOUNTER — Ambulatory Visit: Payer: Medicare Other | Admitting: Physical Therapy

## 2013-12-21 DIAGNOSIS — IMO0001 Reserved for inherently not codable concepts without codable children: Secondary | ICD-10-CM | POA: Diagnosis not present

## 2013-12-23 ENCOUNTER — Ambulatory Visit: Payer: Medicare Other | Admitting: Physical Therapy

## 2013-12-24 ENCOUNTER — Ambulatory Visit: Payer: Medicare Other | Admitting: Physical Therapy

## 2013-12-24 DIAGNOSIS — IMO0001 Reserved for inherently not codable concepts without codable children: Secondary | ICD-10-CM | POA: Diagnosis not present

## 2013-12-29 ENCOUNTER — Ambulatory Visit: Payer: Medicare Other | Admitting: Physical Therapy

## 2013-12-29 DIAGNOSIS — IMO0001 Reserved for inherently not codable concepts without codable children: Secondary | ICD-10-CM | POA: Diagnosis not present

## 2014-01-05 ENCOUNTER — Ambulatory Visit: Payer: Medicare Other | Admitting: Physical Therapy

## 2014-01-05 DIAGNOSIS — IMO0001 Reserved for inherently not codable concepts without codable children: Secondary | ICD-10-CM | POA: Diagnosis not present

## 2014-01-07 ENCOUNTER — Ambulatory Visit: Payer: Medicare Other | Attending: Orthopedic Surgery | Admitting: Physical Therapy

## 2014-01-07 DIAGNOSIS — M25612 Stiffness of left shoulder, not elsewhere classified: Secondary | ICD-10-CM | POA: Insufficient documentation

## 2014-01-07 DIAGNOSIS — E119 Type 2 diabetes mellitus without complications: Secondary | ICD-10-CM | POA: Insufficient documentation

## 2014-01-07 DIAGNOSIS — M7502 Adhesive capsulitis of left shoulder: Secondary | ICD-10-CM | POA: Diagnosis not present

## 2014-01-07 DIAGNOSIS — M25512 Pain in left shoulder: Secondary | ICD-10-CM | POA: Insufficient documentation

## 2014-01-07 DIAGNOSIS — I10 Essential (primary) hypertension: Secondary | ICD-10-CM | POA: Diagnosis not present

## 2014-01-12 ENCOUNTER — Ambulatory Visit: Payer: Medicare Other | Admitting: Physical Therapy

## 2014-01-12 DIAGNOSIS — M7502 Adhesive capsulitis of left shoulder: Secondary | ICD-10-CM | POA: Diagnosis not present

## 2014-01-14 ENCOUNTER — Ambulatory Visit: Payer: Medicare Other | Admitting: Physical Therapy

## 2014-01-18 ENCOUNTER — Ambulatory Visit: Payer: Medicare Other | Admitting: Physical Therapy

## 2014-01-18 DIAGNOSIS — M7502 Adhesive capsulitis of left shoulder: Secondary | ICD-10-CM | POA: Diagnosis not present

## 2014-03-24 ENCOUNTER — Other Ambulatory Visit: Payer: Self-pay | Admitting: *Deleted

## 2014-03-24 DIAGNOSIS — M7989 Other specified soft tissue disorders: Secondary | ICD-10-CM

## 2014-03-28 ENCOUNTER — Encounter: Payer: Self-pay | Admitting: Vascular Surgery

## 2014-03-29 ENCOUNTER — Ambulatory Visit (INDEPENDENT_AMBULATORY_CARE_PROVIDER_SITE_OTHER): Payer: Medicare Other | Admitting: Vascular Surgery

## 2014-03-29 ENCOUNTER — Encounter: Payer: Self-pay | Admitting: Vascular Surgery

## 2014-03-29 ENCOUNTER — Ambulatory Visit (HOSPITAL_COMMUNITY)
Admission: RE | Admit: 2014-03-29 | Discharge: 2014-03-29 | Disposition: A | Discharge status: Home / Self Care | Payer: Medicare Other | Source: Ambulatory Visit | Attending: Vascular Surgery | Admitting: Vascular Surgery

## 2014-03-29 VITALS — BP 142/86 | HR 84 | Resp 18 | Ht 68.75 in | Wt 305.0 lb

## 2014-03-29 DIAGNOSIS — M7989 Other specified soft tissue disorders: Secondary | ICD-10-CM | POA: Insufficient documentation

## 2014-03-29 DIAGNOSIS — I83893 Varicose veins of bilateral lower extremities with other complications: Secondary | ICD-10-CM

## 2014-03-29 NOTE — Progress Notes (Signed)
Subjective:     Patient ID: Keith [name], male   DOB: 1940-11-19, 73 y.o.   MRN: [id number]  HPI this 73 year old male is referred for severe painful varicose veins in both lower extremities. Patient also states that he developed an ulceration in the left ankle a few weeks ago which he is concerned about. He has never had an ulcer in this area before. It has not bled. He does have painful varicosities in both legs and chronic swelling. His legs have become darkened and thickened in the lower half below the knee over the past 5-10 years with chronic edema. He has never had treatment of his venous disease. He does have a remote history of DVT in the left popliteal vein and was treated with Coumadin for about one year but not in the past 5 years. He does wear short leg elastic compression stockings on a daily basis. He is unable to wear a long-leg stockings because of the size of his thighs. He has tried this in the past but now wears 20-30 millimeter gradient stockings up to the knees.  Past Medical History  Diagnosis Date  . Diabetes mellitus   . Hypertension   . Stroke   . Prostate hypertrophy   . Cirrhosis of liver   . Hepatic cancer     History  Substance Use Topics  . Smoking status: Former Research scientist (life sciences)  . Smokeless tobacco: Former Systems developer    Quit date: 11/20/1983  . Alcohol Use: No    Family History  Problem Relation Age of Onset  . Stroke Father     Allergies  Allergen Reactions  . Iodine Rash    Current outpatient prescriptions: bisacodyl (DULCOLAX) 10 MG suppository, Place 1 suppository (10 mg total) rectally daily as needed., Disp: 12 suppository, Rfl: 0;  diphenhydrAMINE (BENADRYL) 12.5 MG/5ML elixir, Take 5-10 mLs (12.5-25 mg total) by mouth every 4 (four) hours as needed for itching., Disp: 120 mL, Rfl: 0;  docusate sodium 100 MG CAPS, Take 100 mg by mouth 2 (two) times daily., Disp: 10 capsule, Rfl: 0 fentaNYL (DURAGESIC - DOSED MCG/HR) 100 MCG/HR, Place 1 patch onto the  skin every 3 (three) days., Disp: , Rfl: ;  finasteride (PROSCAR) 5 MG tablet, Take 5 mg by mouth daily.  , Disp: , Rfl: ;  furosemide (LASIX) 40 MG tablet, Take 40 mg by mouth every morning., Disp: , Rfl: ;  hydrocerin (EUCERIN) CREA, Apply 1 application topically daily., Disp: , Rfl: ;  hydrocortisone 1 % lotion, Apply 1 application topically daily., Disp: , Rfl:  insulin aspart (NOVOLOG) 100 UNIT/ML injection, Inject 2-10 Units into the skin 3 (three) times daily before meals. BS 150-200=2 units; 201-250=4 units; 251-300= 6 units; 301-350= 8 units; 350 and greater is 10 units, Disp: , Rfl: ;  insulin glargine (LANTUS) 100 UNIT/ML injection, Inject 50 Units into the skin every evening. , Disp: , Rfl:  iron polysaccharides (NIFEREX) 150 MG capsule, Take 1 capsule (150 mg total) by mouth 2 (two) times daily., Disp: 42 capsule, Rfl: 0;  loratadine (CLARITIN) 10 MG tablet, Take 10 mg by mouth daily as needed for allergies., Disp: , Rfl: ;  methocarbamol (ROBAXIN) 500 MG tablet, Take 1 tablet (500 mg total) by mouth every 6 (six) hours as needed., Disp: 80 tablet, Rfl: 0 metoCLOPramide (REGLAN) 5 MG tablet, Take 1-2 tablets (5-10 mg total) by mouth every 8 (eight) hours as needed (if ondansetron (ZOFRAN) ineffective.)., Disp: 40 tablet, Rfl: 0;  ondansetron (ZOFRAN) 4 MG tablet,  Take 1 tablet (4 mg total) by mouth every 6 (six) hours as needed for nausea., Disp: 40 tablet, Rfl: 0 oxyCODONE (OXY IR/ROXICODONE) 5 MG immediate release tablet, Take 1-3 tablets (5-15 mg total) by mouth every 4 (four) hours as needed., Disp: 80 tablet, Rfl: 0;  OxyCODONE (OXYCONTIN) 10 mg T12A 12 hr tablet, Take one tablet by mouth every 12 hours. Do not crush, Disp: 60 tablet, Rfl: 0;  pantoprazole (PROTONIX) 40 MG tablet, Take 40 mg by mouth daily., Disp: , Rfl:  polyethylene glycol (MIRALAX / GLYCOLAX) packet, Take 17 g by mouth daily as needed., Disp: 14 each, Rfl: 0;  polyvinyl alcohol (LIQUIFILM TEARS) 1.4 % ophthalmic solution,  Place 1 drop into both eyes 2 (two) times daily as needed (dry eyes)., Disp: , Rfl: ;  pregabalin (LYRICA) 150 MG capsule, Take 150 mg by mouth 2 (two) times daily., Disp: , Rfl:  senna (SENOKOT) 8.6 MG TABS tablet, Take 1 tablet by mouth daily as needed. Constipation, Disp: , Rfl: ;  silver sulfADIAZINE (SILVADENE) 1 % cream, Apply topically daily. Apply to the right toe daily and cover with a dry dressing., Disp: 50 g, Rfl: 0;  silver sulfADIAZINE (SILVADENE) 1 % cream, Apply topically daily. Apply to right toe daily and cover with a dry dressing daily., Disp: 50 g, Rfl: 0 silver sulfADIAZINE (SILVADENE) 1 % cream, Apply topically daily., Disp: 50 g, Rfl: 0;  simvastatin (ZOCOR) 20 MG tablet, Take 10 mg by mouth every evening., Disp: , Rfl: ;  spironolactone (ALDACTONE) 100 MG tablet, Take 100 mg by mouth every morning. , Disp: , Rfl: ;  tamsulosin (FLOMAX) 0.4 MG CAPS, Take 0.4 mg by mouth daily after supper. , Disp: , Rfl:  traMADol (ULTRAM) 50 MG tablet, Take 1-2 tablets (50-100 mg total) by mouth every 6 (six) hours as needed (mild pain)., Disp: 60 tablet, Rfl: 0 warfarin (COUMADIN) 5 MG tablet, Take 1.5-2 tablets (7.5-10 mg total) by mouth daily. Take Coumadin for three weeks for postoperative protocol and then the patient may resume their previous Coumadin home regimen.  The dose may need to be adjusted based upon the INR.  Please follow the INR and titrate Coumadin dose for a therapeutic range between 2.0 and 3.0 INR.  After three weeks of Coumadin, the patient may resume their previous Coumadin home regimen and their Plavix., Disp: 45 tablet, Rfl: 0  BP 142/86 mmHg  Pulse 84  Resp 18  Ht 5' 8.75" (1.746 m)  Wt 305 lb (138.347 kg)  BMI 45.38 kg/m2  Body mass index is 45.38 kg/(m^2).         Review of Systems denies chest pain, dyspnea on exertion, her most. 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 Has chronic  obesity. Does not use alcohol. 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 Has diabetes mellitus and has had one toe amputation on the left and 2 toe amputations on the right. Other systems negative and a complete review of systems     Objective:   Physical Exam BP 142/86 mmHg  Pulse 84  Resp 18  Ht 5' 8.75" (1.746 m)  Wt 305 lb (138.347 kg)  BMI 45.38 kg/m2  Gen.-alert and oriented x3 in no apparent distress-obese HEENT normal for age Lungs no rhonchi or wheezing Cardiovascular regular rhythm no murmurs carotid pulses 3+ palpable no bruits audible Abdomen soft nontender no palpable masses-obese Musculoskeletal  free of  major deformities Skin clear -severe hyperpigmentation lower half both lower extremities with lipo dermato sclerosis bilaterally. Ulceration left ankle adjacent to medial malleolus proximally 1/2 cm in diameter. Neurologic normal Lower extremities 3+ femoral and dorsalis pedis pulses palpable bilaterally with no edema excellent Doppler flow in bilateral posterior tibial and dorsalis pedis arteries. Healed amputation left second toe and right second and third toes. Bulging varicosities in anterior thigh on the right and lateral thigh on the left. 1+ edema bilaterally.  Today I ordered bilateral venous duplex exam which I reviewed and interpreted. There is no DVT. There is deep reflux bilaterally. There is severe superficial gross reflux in the great saphenous systems with large caliber veins supplying these bulging varicosities.       Assessment:     Severe skin changes bilaterally due to gross reflux bilateral great saphenous veins and deep venous reflux. Now with active ulceration left ankle. Patient has treated this with chronic short leg elastic compression stockings 20-30 millimeter gradient because of inability to use long-leg stockings because of large caliber thighs. Has also tried elevation and ibuprofen     Plan:     Patient needs #1 laser ablation left great saphenous vein followed by #2 laser ablation right great saphenous vein. Patient has active ulcer and needs treatment as soon as possible. Will proceed with precertification to perform this in the near future. Patient has failed conservative measures and has severe skin changes and active ulceration.

## 2014-04-28 ENCOUNTER — Encounter: Payer: Self-pay | Admitting: Vascular Surgery

## 2014-05-02 ENCOUNTER — Ambulatory Visit (INDEPENDENT_AMBULATORY_CARE_PROVIDER_SITE_OTHER): Payer: Medicare Other | Admitting: Vascular Surgery

## 2014-05-02 ENCOUNTER — Encounter: Payer: Self-pay | Admitting: Vascular Surgery

## 2014-05-02 VITALS — BP 117/82 | HR 87 | Resp 18 | Ht 70.0 in | Wt 200.0 lb

## 2014-05-02 DIAGNOSIS — I83893 Varicose veins of bilateral lower extremities with other complications: Secondary | ICD-10-CM

## 2014-05-02 NOTE — Progress Notes (Signed)
     Laser Ablation Procedure    Date: 05/02/2014   Keith [name] DOB:[birth year]  Consent signed: Yes    Surgeon:  Dr. Sherren Mocha Early  Procedure: Laser Ablation: left Greater Saphenous Vein  Start: 9am  Stop: 9:50  BP 117/82 mmHg  Pulse 87  Resp 18  Ht 5' 10"  (1.778 m)  Wt 200 lb (90.719 kg)  BMI 28.70 kg/m2  Tumescent Anesthesia: 300 cc 0.9% NaCl with 50 cc Lidocaine HCL with 1% Epi and 15 cc 8.4% NaHCO3  Local Anesthesia: 6 cc Lidocaine HCL and NaHCO3 (ratio 2:1)  Pulsed mode: 15watts, 557m delay, 1.0 duration Total energy: 1332, total pulses: 89, total time: 1:29      Patient tolerated procedure well  Notes:   Description of Procedure:  After marking the course of the secondary varicosities, the patient was placed on the operating table in the supine position, and the left leg was prepped and draped in sterile fashion.   Local anesthetic was administered and under ultrasound guidance the saphenous vein was accessed with a micro needle and guide wire; then the mirco puncture sheath was placed.  A guide wire was inserted saphenofemoral junction , followed by a 5 french sheath.  The position of the sheath and then the laser fiber below the junction was confirmed using the ultrasound.  Tumescent anesthesia was administered along the course of the saphenous vein using ultrasound guidance. The patient was placed in Trendelenburg position and protective laser glasses were placed on patient and staff, and the laser was fired at 15 watts continuous mode advancing 1-279msecond for a total of 1332 joules.     Steri strips were applied to the stab wounds and ABD pads and thigh high compression stockings were applied.  Ace wrap bandages were applied over the phlebectomy sites and at the top of the saphenofemoral junction. Blood loss was less than 15 cc.  The patient ambulated out of the operating room having tolerated the procedure well.

## 2014-05-02 NOTE — Progress Notes (Signed)
Subjective:     Patient ID: Keith [name], male   DOB: 14-Jun-1940, 74 y.o.   MRN: [id number]  HPI this 74 year old male had laser ablation of the left great saphenous vein from the mid to distal thigh to near the saphenofemoral junction performed under local tumescent anesthesia for gross reflux with active venous ulcer left ankle. He tolerated the procedure well. A total of 1330 J of energy was utilized.   Review of Systems     Objective:   Physical Exam BP 117/82 mmHg  Pulse 87  Resp 18  Ht 5' 10"  (1.778 m)  Wt 200 lb (90.719 kg)  BMI 28.70 kg/m2       Assessment:     Well-tolerated laser ablation left great saphenous vein performed under local tumescent anesthesia for gross reflux with active ulcer left ankle    Plan:     Return in one week for venous duplex exam confirm closure left great saphenous vein. He will then have similar procedure on contralateral right leg in a few weeks

## 2014-05-03 ENCOUNTER — Telehealth: Payer: Self-pay | Admitting: *Deleted

## 2014-05-03 NOTE — Telephone Encounter (Signed)
Pt doing well. Slight discomfort. Following all instructions.

## 2014-05-04 ENCOUNTER — Other Ambulatory Visit: Payer: Self-pay | Admitting: *Deleted

## 2014-05-04 DIAGNOSIS — I83892 Varicose veins of left lower extremities with other complications: Secondary | ICD-10-CM

## 2014-05-05 ENCOUNTER — Encounter: Payer: Self-pay | Admitting: Vascular Surgery

## 2014-05-09 ENCOUNTER — Ambulatory Visit (INDEPENDENT_AMBULATORY_CARE_PROVIDER_SITE_OTHER): Payer: Medicare Other | Admitting: Vascular Surgery

## 2014-05-09 ENCOUNTER — Ambulatory Visit (HOSPITAL_COMMUNITY)
Admission: RE | Admit: 2014-05-09 | Discharge: 2014-05-09 | Disposition: A | Discharge status: Home / Self Care | Payer: Medicare Other | Source: Ambulatory Visit | Attending: Vascular Surgery | Admitting: Vascular Surgery

## 2014-05-09 VITALS — BP 122/74 | HR 76 | Resp 16 | Ht 70.0 in | Wt 300.0 lb

## 2014-05-09 DIAGNOSIS — I83892 Varicose veins of left lower extremities with other complications: Secondary | ICD-10-CM | POA: Diagnosis not present

## 2014-05-09 NOTE — Progress Notes (Signed)
Subjective:     Patient ID: Keith [name], male   DOB: Oct 05, 1940, 74 y.o.   MRN: [id number]  HPI this 74 year old male returns 1 week post laser ablation left great saphenous vein from distal thigh to near the saphenofemoral junction for venous hypertension with chronic edema and a history of stasis ulcer. He has had minimal discomfort in the thigh. He has had no worsening of chronic edema, chest pain, dyspnea on exertion, or other complaints regarding the left leg. He has worn his elastic compression stocking and taken ibuprofen as instructed.  Past Medical History  Diagnosis Date  . Diabetes mellitus   . Hypertension   . Stroke   . Prostate hypertrophy   . Cirrhosis of liver   . Hepatic cancer     History  Substance Use Topics  . Smoking status: Former Research scientist (life sciences)  . Smokeless tobacco: Former Systems developer    Quit date: 11/20/1983  . Alcohol Use: No    Family History  Problem Relation Age of Onset  . Stroke Father     Allergies  Allergen Reactions  . Iodine Rash     Current outpatient prescriptions:  .  bisacodyl (DULCOLAX) 10 MG suppository, Place 1 suppository (10 mg total) rectally daily as needed., Disp: 12 suppository, Rfl: 0 .  diphenhydrAMINE (BENADRYL) 12.5 MG/5ML elixir, Take 5-10 mLs (12.5-25 mg total) by mouth every 4 (four) hours as needed for itching., Disp: 120 mL, Rfl: 0 .  docusate sodium 100 MG CAPS, Take 100 mg by mouth 2 (two) times daily., Disp: 10 capsule, Rfl: 0 .  fentaNYL (DURAGESIC - DOSED MCG/HR) 100 MCG/HR, Place 1 patch onto the skin every 3 (three) days., Disp: , Rfl:  .  finasteride (PROSCAR) 5 MG tablet, Take 5 mg by mouth daily.  , Disp: , Rfl:  .  furosemide (LASIX) 40 MG tablet, Take 40 mg by mouth every morning., Disp: , Rfl:  .  hydrocerin (EUCERIN) CREA, Apply 1 application topically daily., Disp: , Rfl:  .  hydrocortisone 1 % lotion, Apply 1 application topically daily., Disp: , Rfl:  .  insulin aspart (NOVOLOG) 100 UNIT/ML injection, Inject  2-10 Units into the skin 3 (three) times daily before meals. BS 150-200=2 units; 201-250=4 units; 251-300= 6 units; 301-350= 8 units; 350 and greater is 10 units, Disp: , Rfl:  .  insulin glargine (LANTUS) 100 UNIT/ML injection, Inject 50 Units into the skin every evening. , Disp: , Rfl:  .  iron polysaccharides (NIFEREX) 150 MG capsule, Take 1 capsule (150 mg total) by mouth 2 (two) times daily., Disp: 42 capsule, Rfl: 0 .  loratadine (CLARITIN) 10 MG tablet, Take 10 mg by mouth daily as needed for allergies., Disp: , Rfl:  .  methocarbamol (ROBAXIN) 500 MG tablet, Take 1 tablet (500 mg total) by mouth every 6 (six) hours as needed., Disp: 80 tablet, Rfl: 0 .  metoCLOPramide (REGLAN) 5 MG tablet, Take 1-2 tablets (5-10 mg total) by mouth every 8 (eight) hours as needed (if ondansetron (ZOFRAN) ineffective.)., Disp: 40 tablet, Rfl: 0 .  ondansetron (ZOFRAN) 4 MG tablet, Take 1 tablet (4 mg total) by mouth every 6 (six) hours as needed for nausea., Disp: 40 tablet, Rfl: 0 .  pantoprazole (PROTONIX) 40 MG tablet, Take 40 mg by mouth daily., Disp: , Rfl:  .  polyethylene glycol (MIRALAX / GLYCOLAX) packet, Take 17 g by mouth daily as needed., Disp: 14 each, Rfl: 0 .  polyvinyl alcohol (LIQUIFILM TEARS) 1.4 % ophthalmic solution, Place  1 drop into both eyes 2 (two) times daily as needed (dry eyes)., Disp: , Rfl:  .  pregabalin (LYRICA) 150 MG capsule, Take 150 mg by mouth 2 (two) times daily., Disp: , Rfl:  .  senna (SENOKOT) 8.6 MG TABS tablet, Take 1 tablet by mouth daily as needed. Constipation, Disp: , Rfl:  .  silver sulfADIAZINE (SILVADENE) 1 % cream, Apply topically daily. Apply to the right toe daily and cover with a dry dressing., Disp: 50 g, Rfl: 0 .  simvastatin (ZOCOR) 20 MG tablet, Take 10 mg by mouth every evening., Disp: , Rfl:  .  spironolactone (ALDACTONE) 100 MG tablet, Take 100 mg by mouth every morning. , Disp: , Rfl:  .  tamsulosin (FLOMAX) 0.4 MG CAPS, Take 0.4 mg by mouth daily  after supper. , Disp: , Rfl:  .  traMADol (ULTRAM) 50 MG tablet, Take 1-2 tablets (50-100 mg total) by mouth every 6 (six) hours as needed (mild pain)., Disp: 60 tablet, Rfl: 0 .  warfarin (COUMADIN) 5 MG tablet, Take 1.5-2 tablets (7.5-10 mg total) by mouth daily. Take Coumadin for three weeks for postoperative protocol and then the patient may resume their previous Coumadin home regimen.  The dose may need to be adjusted based upon the INR.  Please follow the INR and titrate Coumadin dose for a therapeutic range between 2.0 and 3.0 INR.  After three weeks of Coumadin, the patient may resume their previous Coumadin home regimen and their Plavix., Disp: 45 tablet, Rfl: 0 .  oxyCODONE (OXY IR/ROXICODONE) 5 MG immediate release tablet, Take 1-3 tablets (5-15 mg total) by mouth every 4 (four) hours as needed. (Patient not taking: Reported on 05/09/2014), Disp: 80 tablet, Rfl: 0 .  OxyCODONE (OXYCONTIN) 10 mg T12A 12 hr tablet, Take one tablet by mouth every 12 hours. Do not crush (Patient not taking: Reported on 05/09/2014), Disp: 60 tablet, Rfl: 0 .  silver sulfADIAZINE (SILVADENE) 1 % cream, Apply topically daily. Apply to right toe daily and cover with a dry dressing daily. (Patient not taking: Reported on 05/09/2014), Disp: 50 g, Rfl: 0 .  silver sulfADIAZINE (SILVADENE) 1 % cream, Apply topically daily. (Patient not taking: Reported on 05/09/2014), Disp: 50 g, Rfl: 0  BP 122/74 mmHg  Pulse 76  Resp 16  Ht 5' 10"  (1.778 m)  Wt 300 lb (136.079 kg)  BMI 43.05 kg/m2  Body mass index is 43.05 kg/(m^2).         Review of Systems see history of present illness. Denies claudication, hemoptysis, chest pain, dyspnea on exertion.     Objective:   Physical Exam BP 122/74 mmHg  Pulse 76  Resp 16  Ht 5' 10"  (1.778 m)  Wt 300 lb (136.079 kg)  BMI 43.05 kg/m2  Gen. well-developed well-nourished male no apparent stress alert and oriented 3 Lungs no rhonchi or wheezing Cardiovascular regular rhythm no  murmurs Left leg with moderate ecchymosis from mid thigh proximally over great saphenous vein. 2+ dorsalis pedis pulse palpable. Chronic hyperpigmentation lower third leg.  Today I ordered a venous duplex exam the left leg which I reviewed and interpreted. The great saphenous vein is totally closed up to near the saphenofemoral junction and there is no DVT.     Assessment:     Successful laser ablation left great saphenous vein for gross reflux with chronic edema history of stasis ulcer    Plan:     Return every 22nd for similar procedure on contralateral right leg

## 2014-05-25 NOTE — Progress Notes (Deleted)
   Laser Ablation Procedure    Date: 05/25/2014    Keith [name] DOB:[birth year]  Consent signed: {yes/no:20286}  Surgeon{VVS DOCTORS:21022260}  Procedure: Laser Ablation: {Right/Left/Bilat:20232} {VVS GREATER/SMALL:21022274} Saphenous Vein  There were no vitals taken for this visit.  Start:***    End time: ***  Tumescent Anesthesia: *** cc 0.9% NaCl with 50 cc Lidocaine HCL with 1% Epi and 15 cc 8.4% NaHCO3  Local Anesthesia: *** cc Lidocaine HCL and NaHCO3 (ratio 2:1)  {VVS WATTS 2:21022327::"Pulsed mode:15 watts, 540m delay, 1.0 duration","Total energy: ***, Total pulses: ***, Total time: ***"}   Sclerotherapy: *** %Sotradecol. Patient received a total of *** cc  Stab Phlebectomy: *** Sites: {VVS SITES:21022275}  Patient tolerated procedure well: {yes/no:20286}  Notes: ***  Description of Procedure:  After marking the course of the secondary varicosities, the patient was placed on the operating table in the {DESC; PRONE / SUPINE / LATERAL:19389} position, and {Anatomy; leg/right/left/both:19201} was prepped and draped in sterile fashion.   Local anesthetic was administered and under ultrasound guidance the saphenous vein was accessed with a micro needle and guide wire; then the mirco puncture sheath was place.  A guide wire was inserted {VEIN:21022325} junction , followed by a 5 french sheath.  The position of the sheath and then the laser fiber below the junction was confirmed using the ultrasound.  Tumescent anesthesia was administered along the course of the saphenous vein using ultrasound guidance. The patient was placed in Trendelenburg position and protective laser glasses were placed on patient and staff, and the laser was fired at {VVS WATTS:21022326} for a total of *** joules.   For stab phlebectomies, local anesthetic was administered at the previously marked varicosities, and tumescent anesthesia was administered around the vessels.  {stab wounds:22707} stab  wounds were made using the tip of an 11 blade. And using the vein hook, the phlebectomies were performed using a hemostat to avulse the varicosities.  Adequate hemostasis was achieved.   Sclerotherapy was performed to *** varicosities using ***  cc .3% Sotradecol foam via a 27g butterfly needle.  Steri strips were applied to the stab wounds and ABD pads and thigh high compression stockings were applied.  Ace wrap bandages were applied over the phlebectomy sites and at the top of the {VEIN:21022325} junction. Blood loss was less than 15 cc.  The patient ambulated out of the operating room having tolerated the procedure well.

## 2014-05-27 ENCOUNTER — Encounter: Payer: Self-pay | Admitting: Vascular Surgery

## 2014-05-30 ENCOUNTER — Encounter: Payer: Self-pay | Admitting: Vascular Surgery

## 2014-05-30 ENCOUNTER — Ambulatory Visit (INDEPENDENT_AMBULATORY_CARE_PROVIDER_SITE_OTHER): Payer: Medicare Other | Admitting: Vascular Surgery

## 2014-05-30 VITALS — BP 146/80 | HR 74 | Resp 16 | Ht 70.0 in | Wt 300.0 lb

## 2014-05-30 DIAGNOSIS — I83893 Varicose veins of bilateral lower extremities with other complications: Secondary | ICD-10-CM

## 2014-05-30 NOTE — Progress Notes (Signed)
   Laser Ablation Procedure    Date: 05/30/2014    Keith [name] DOB:[birth year]  Consent signed: Yes  SurgeonJ.Rockwell [place]  Procedure: Laser Ablation: right Greater Saphenous Vein  BP 146/80 mmHg  Pulse 74  Resp 16  Ht 5' 10"  (1.778 m)  Wt 300 lb (136.079 kg)  BMI 43.05 kg/m2  Start:9am    End time: 9:45  Tumescent Anesthesia: 450 cc 0.9% NaCl with 50 cc Lidocaine HCL with 1% Epi and 15 cc 8.4% NaHCO3  Local Anesthesia: 3 cc Lidocaine HCL and NaHCO3 (ratio 2:1)  Pulsed Mode: 15 watts, 581m delay, 1.0 duration  Total energy: 1948, Total pulses: 131, Total time: 2:10     Patient tolerated procedure well: Yes  Notes: Hibiclens used instead of betadine d/t allergy  Description of Procedure:  After marking the course of the secondary varicosities, the patient was placed on the operating table in the supine position, and the right leg was prepped and draped in sterile fashion.   Local anesthetic was administered and under ultrasound guidance the saphenous vein was accessed with a micro needle and guide wire; then the mirco puncture sheath was place.  A guide wire was inserted saphenofemoral junction , followed by a 5 french sheath.  The position of the sheath and then the laser fiber below the junction was confirmed using the ultrasound.  Tumescent anesthesia was administered along the course of the saphenous vein using ultrasound guidance. The patient was placed in Trendelenburg position and protective laser glasses were placed on patient and staff, and the laser was fired at 15 watt pulsed mode advancing 1-2 mm per sec for a total of 1948 joules.     Steri strips were applied to the stab wounds and ABD pads and thigh high compression stockings were applied.  Ace wrap bandages were applied over the phlebectomy sites and at the top of the saphenofemoral junction. Blood loss was less than 15 cc.  The patient ambulated out of the operating room having tolerated the procedure  well.

## 2014-05-30 NOTE — Progress Notes (Signed)
Subjective:     Patient ID: Keith [name], male   DOB: 12/06/1940, 74 y.o.   MRN: [id number]  HPI this 74 year old male had laser ablation of the right great saphenous vein performed under local tumescent anesthesia for gross reflux. A total of 1948 J of energy was utilized. He tolerated the procedure well.   Review of Systems     Objective:   Physical Exam BP 146/80 mmHg  Pulse 74  Resp 16  Ht 5' 10"  (1.778 m)  Wt 300 lb (136.079 kg)  BMI 43.05 kg/m2       Assessment:     Well-tolerated laser ablation right great saphenous vein performed under local tumescent anesthesia    Plan:     Return in one week for venous duplex exams confirm closure right great saphenous vein

## 2014-05-31 ENCOUNTER — Telehealth: Payer: Self-pay | Admitting: *Deleted

## 2014-05-31 NOTE — Telephone Encounter (Signed)
Pt doing well. No pain. Following all instructions. Reminded him of his fu appt next Monday.

## 2014-06-01 ENCOUNTER — Other Ambulatory Visit: Payer: Self-pay | Admitting: *Deleted

## 2014-06-01 DIAGNOSIS — I83893 Varicose veins of bilateral lower extremities with other complications: Secondary | ICD-10-CM

## 2014-06-03 ENCOUNTER — Encounter: Payer: Self-pay | Admitting: Vascular Surgery

## 2014-06-06 ENCOUNTER — Ambulatory Visit (HOSPITAL_COMMUNITY)
Admission: RE | Admit: 2014-06-06 | Discharge: 2014-06-06 | Disposition: A | Discharge status: Home / Self Care | Payer: Medicare Other | Source: Ambulatory Visit | Attending: Vascular Surgery | Admitting: Vascular Surgery

## 2014-06-06 ENCOUNTER — Ambulatory Visit (INDEPENDENT_AMBULATORY_CARE_PROVIDER_SITE_OTHER): Payer: Medicare Other | Admitting: Vascular Surgery

## 2014-06-06 ENCOUNTER — Encounter: Payer: Self-pay | Admitting: Vascular Surgery

## 2014-06-06 VITALS — BP 116/73 | HR 101 | Resp 16 | Ht 70.0 in | Wt 300.0 lb

## 2014-06-06 DIAGNOSIS — I83891 Varicose veins of right lower extremities with other complications: Secondary | ICD-10-CM

## 2014-06-06 DIAGNOSIS — I83893 Varicose veins of bilateral lower extremities with other complications: Secondary | ICD-10-CM | POA: Diagnosis not present

## 2014-06-06 NOTE — Progress Notes (Signed)
Subjective:     Patient ID: Keith [name], male   DOB: [birth year], 74 y.o.   MRN: [id number]  HPI this 74 year old male is one week status post laser ablation right great saphenous vein for gross reflux with a history of stasis ulcers. Has had a similar procedure performed on the left side 05/09/2014. He has had no chest pain, dyspnea on exertion, PND, orthopnea, or hemoptysis. He has worn his elastic compression stocking as instructed and taken ibuprofen. He has had more pain and hypersensitivity in the mid to distal thigh in this leg. He has been unable to wear the elastic compression stocking because it keeps slipping down and causing more pain. He is using an Ace wrap. He states that the symptoms have improved over the past few days. He has had no change in distal edema.  Past Medical History  Diagnosis Date  . Diabetes mellitus   . Hypertension   . Stroke   . Prostate hypertrophy   . Cirrhosis of liver   . Hepatic cancer   . Varicose veins     History  Substance Use Topics  . Smoking status: Former Research scientist (life sciences)  . Smokeless tobacco: Former Systems developer    Quit date: 11/20/1983  . Alcohol Use: No    Family History  Problem Relation Age of Onset  . Stroke Father     Allergies  Allergen Reactions  . Iodine Rash     Current outpatient prescriptions:  .  bisacodyl (DULCOLAX) 10 MG suppository, Place 1 suppository (10 mg total) rectally daily as needed., Disp: 12 suppository, Rfl: 0 .  diphenhydrAMINE (BENADRYL) 12.5 MG/5ML elixir, Take 5-10 mLs (12.5-25 mg total) by mouth every 4 (four) hours as needed for itching., Disp: 120 mL, Rfl: 0 .  docusate sodium 100 MG CAPS, Take 100 mg by mouth 2 (two) times daily., Disp: 10 capsule, Rfl: 0 .  fentaNYL (DURAGESIC - DOSED MCG/HR) 100 MCG/HR, Place 1 patch onto the skin every 3 (three) days., Disp: , Rfl:  .  finasteride (PROSCAR) 5 MG tablet, Take 5 mg by mouth daily.  , Disp: , Rfl:  .  furosemide (LASIX) 40 MG tablet, Take 40 mg by mouth  every morning., Disp: , Rfl:  .  hydrocerin (EUCERIN) CREA, Apply 1 application topically daily., Disp: , Rfl:  .  hydrocortisone 1 % lotion, Apply 1 application topically daily., Disp: , Rfl:  .  insulin aspart (NOVOLOG) 100 UNIT/ML injection, Inject 2-10 Units into the skin 3 (three) times daily before meals. BS 150-200=2 units; 201-250=4 units; 251-300= 6 units; 301-350= 8 units; 350 and greater is 10 units, Disp: , Rfl:  .  insulin glargine (LANTUS) 100 UNIT/ML injection, Inject 50 Units into the skin every evening. , Disp: , Rfl:  .  iron polysaccharides (NIFEREX) 150 MG capsule, Take 1 capsule (150 mg total) by mouth 2 (two) times daily., Disp: 42 capsule, Rfl: 0 .  loratadine (CLARITIN) 10 MG tablet, Take 10 mg by mouth daily as needed for allergies., Disp: , Rfl:  .  methocarbamol (ROBAXIN) 500 MG tablet, Take 1 tablet (500 mg total) by mouth every 6 (six) hours as needed., Disp: 80 tablet, Rfl: 0 .  metoCLOPramide (REGLAN) 5 MG tablet, Take 1-2 tablets (5-10 mg total) by mouth every 8 (eight) hours as needed (if ondansetron (ZOFRAN) ineffective.)., Disp: 40 tablet, Rfl: 0 .  ondansetron (ZOFRAN) 4 MG tablet, Take 1 tablet (4 mg total) by mouth every 6 (six) hours as needed for nausea., Disp: 40  tablet, Rfl: 0 .  pantoprazole (PROTONIX) 40 MG tablet, Take 40 mg by mouth daily., Disp: , Rfl:  .  polyethylene glycol (MIRALAX / GLYCOLAX) packet, Take 17 g by mouth daily as needed., Disp: 14 each, Rfl: 0 .  polyvinyl alcohol (LIQUIFILM TEARS) 1.4 % ophthalmic solution, Place 1 drop into both eyes 2 (two) times daily as needed (dry eyes)., Disp: , Rfl:  .  pregabalin (LYRICA) 150 MG capsule, Take 150 mg by mouth 2 (two) times daily., Disp: , Rfl:  .  senna (SENOKOT) 8.6 MG TABS tablet, Take 1 tablet by mouth daily as needed. Constipation, Disp: , Rfl:  .  silver sulfADIAZINE (SILVADENE) 1 % cream, Apply topically daily. Apply to the right toe daily and cover with a dry dressing., Disp: 50 g, Rfl:  0 .  simvastatin (ZOCOR) 20 MG tablet, Take 10 mg by mouth every evening., Disp: , Rfl:  .  spironolactone (ALDACTONE) 100 MG tablet, Take 100 mg by mouth every morning. , Disp: , Rfl:  .  tamsulosin (FLOMAX) 0.4 MG CAPS, Take 0.4 mg by mouth daily after supper. , Disp: , Rfl:  .  traMADol (ULTRAM) 50 MG tablet, Take 1-2 tablets (50-100 mg total) by mouth every 6 (six) hours as needed (mild pain)., Disp: 60 tablet, Rfl: 0 .  warfarin (COUMADIN) 5 MG tablet, Take 1.5-2 tablets (7.5-10 mg total) by mouth daily. Take Coumadin for three weeks for postoperative protocol and then the patient may resume their previous Coumadin home regimen.  The dose may need to be adjusted based upon the INR.  Please follow the INR and titrate Coumadin dose for a therapeutic range between 2.0 and 3.0 INR.  After three weeks of Coumadin, the patient may resume their previous Coumadin home regimen and their Plavix., Disp: 45 tablet, Rfl: 0 .  oxyCODONE (OXY IR/ROXICODONE) 5 MG immediate release tablet, Take 1-3 tablets (5-15 mg total) by mouth every 4 (four) hours as needed. (Patient not taking: Reported on 06/06/2014), Disp: 80 tablet, Rfl: 0 .  OxyCODONE (OXYCONTIN) 10 mg T12A 12 hr tablet, Take one tablet by mouth every 12 hours. Do not crush (Patient not taking: Reported on 06/06/2014), Disp: 60 tablet, Rfl: 0 .  silver sulfADIAZINE (SILVADENE) 1 % cream, Apply topically daily. Apply to right toe daily and cover with a dry dressing daily. (Patient not taking: Reported on 05/09/2014), Disp: 50 g, Rfl: 0 .  silver sulfADIAZINE (SILVADENE) 1 % cream, Apply topically daily. (Patient not taking: Reported on 05/09/2014), Disp: 50 g, Rfl: 0  BP 116/73 mmHg  Pulse 101  Resp 16  Ht 5' 10"  (1.778 m)  Wt 300 lb (136.079 kg)  BMI 43.05 kg/m2  Body mass index is 43.05 kg/(m^2).           Review of Systems see history of present illness,    Objective:   Physical Exam BP 116/73 mmHg  Pulse 101  Resp 16  Ht 5' 10"  (1.778 m)   Wt 300 lb (136.079 kg)  BMI 43.05 kg/m2  Gen. well-developed well-nourished male no apparent stress alert and oriented 3 Lungs no rhonchi or wheezing Right leg with mild discomfort to palpation along course of great saphenous vein and medial thigh from knee to near saphenofemoral junction. Chronic hyperpigmentation distally with 1+ edema.  Today I ordered a venous duplex exam of the right leg which I reviewed and interpreted. There is no DVT. The great saphenous vein is totally closed from the distal thigh to near the  saphenofemoral junction.     Assessment:     Successful bilateral laser ablation great saphenous veins in patient with history of bilateral venous stasis ulcers and gross reflux in superficial venous system He will return to wound center for further ulcerations and return to see [place] on when necessary basis    Plan:

## 2014-07-11 ENCOUNTER — Encounter (HOSPITAL_COMMUNITY): Payer: Self-pay

## 2014-07-11 DIAGNOSIS — Z7952 Long term (current) use of systemic steroids: Secondary | ICD-10-CM | POA: Insufficient documentation

## 2014-07-11 DIAGNOSIS — Z7901 Long term (current) use of anticoagulants: Secondary | ICD-10-CM | POA: Diagnosis not present

## 2014-07-11 DIAGNOSIS — R0609 Other forms of dyspnea: Secondary | ICD-10-CM | POA: Diagnosis not present

## 2014-07-11 DIAGNOSIS — Z794 Long term (current) use of insulin: Secondary | ICD-10-CM | POA: Diagnosis not present

## 2014-07-11 DIAGNOSIS — I10 Essential (primary) hypertension: Secondary | ICD-10-CM | POA: Insufficient documentation

## 2014-07-11 DIAGNOSIS — Z8673 Personal history of transient ischemic attack (TIA), and cerebral infarction without residual deficits: Secondary | ICD-10-CM | POA: Insufficient documentation

## 2014-07-11 DIAGNOSIS — D696 Thrombocytopenia, unspecified: Secondary | ICD-10-CM | POA: Diagnosis not present

## 2014-07-11 DIAGNOSIS — E119 Type 2 diabetes mellitus without complications: Secondary | ICD-10-CM | POA: Diagnosis not present

## 2014-07-11 DIAGNOSIS — Z8505 Personal history of malignant neoplasm of liver: Secondary | ICD-10-CM | POA: Insufficient documentation

## 2014-07-11 DIAGNOSIS — Z87891 Personal history of nicotine dependence: Secondary | ICD-10-CM | POA: Diagnosis not present

## 2014-07-11 DIAGNOSIS — N289 Disorder of kidney and ureter, unspecified: Secondary | ICD-10-CM | POA: Insufficient documentation

## 2014-07-11 DIAGNOSIS — R791 Abnormal coagulation profile: Secondary | ICD-10-CM | POA: Insufficient documentation

## 2014-07-11 DIAGNOSIS — Z8719 Personal history of other diseases of the digestive system: Secondary | ICD-10-CM | POA: Diagnosis not present

## 2014-07-11 DIAGNOSIS — D649 Anemia, unspecified: Secondary | ICD-10-CM | POA: Diagnosis not present

## 2014-07-11 NOTE — ED Notes (Signed)
Pt. Reports seen by [facility] PCP today and had labwork done. Pt. Was called around 2000 tonight and stated ddimer was elevated (5.20). Sent here to rule out blood clots. Reports SOB x6 months, mild increase in wheezing recently, difficulty taking deep breath. Denies CP

## 2014-07-12 ENCOUNTER — Encounter (HOSPITAL_COMMUNITY): Payer: Self-pay

## 2014-07-12 ENCOUNTER — Emergency Department (HOSPITAL_COMMUNITY)
Admission: EM | Admit: 2014-07-12 | Discharge: 2014-07-12 | Disposition: A | Discharge status: Home / Self Care | Payer: Medicare Other | Attending: Emergency Medicine | Admitting: Emergency Medicine

## 2014-07-12 ENCOUNTER — Emergency Department (HOSPITAL_COMMUNITY): Payer: Medicare Other

## 2014-07-12 DIAGNOSIS — R7989 Other specified abnormal findings of blood chemistry: Secondary | ICD-10-CM

## 2014-07-12 DIAGNOSIS — D649 Anemia, unspecified: Secondary | ICD-10-CM

## 2014-07-12 DIAGNOSIS — N289 Disorder of kidney and ureter, unspecified: Secondary | ICD-10-CM

## 2014-07-12 DIAGNOSIS — R0602 Shortness of breath: Secondary | ICD-10-CM

## 2014-07-12 DIAGNOSIS — D696 Thrombocytopenia, unspecified: Secondary | ICD-10-CM

## 2014-07-12 DIAGNOSIS — R0609 Other forms of dyspnea: Secondary | ICD-10-CM

## 2014-07-12 DIAGNOSIS — R0600 Dyspnea, unspecified: Secondary | ICD-10-CM

## 2014-07-12 LAB — CBC WITH DIFFERENTIAL/PLATELET
BASOS PCT: 0 % (ref 0–1)
Basophils Absolute: 0 10*3/uL (ref 0.0–0.1)
Eosinophils Absolute: 0.1 10*3/uL (ref 0.0–0.7)
Eosinophils Relative: 3 % (ref 0–5)
HEMATOCRIT: 37.9 % — AB (ref 39.0–52.0)
Hemoglobin: 12.7 g/dL — ABNORMAL LOW (ref 13.0–17.0)
Lymphocytes Relative: 25 % (ref 12–46)
Lymphs Abs: 1.1 10*3/uL (ref 0.7–4.0)
MCH: 30.1 pg (ref 26.0–34.0)
MCHC: 33.5 g/dL (ref 30.0–36.0)
MCV: 89.8 fL (ref 78.0–100.0)
MONOS PCT: 11 % (ref 3–12)
Monocytes Absolute: 0.5 10*3/uL (ref 0.1–1.0)
NEUTROS PCT: 62 % (ref 43–77)
Neutro Abs: 2.7 10*3/uL (ref 1.7–7.7)
PLATELETS: 79 10*3/uL — AB (ref 150–400)
RBC: 4.22 MIL/uL (ref 4.22–5.81)
RDW: 15.8 % — ABNORMAL HIGH (ref 11.5–15.5)
WBC: 4.3 10*3/uL (ref 4.0–10.5)

## 2014-07-12 LAB — BRAIN NATRIURETIC PEPTIDE: B Natriuretic Peptide: 97.4 pg/mL (ref 0.0–100.0)

## 2014-07-12 LAB — BASIC METABOLIC PANEL
Anion gap: 8 (ref 5–15)
BUN: 22 mg/dL (ref 6–23)
CALCIUM: 8.9 mg/dL (ref 8.4–10.5)
CO2: 26 mmol/L (ref 19–32)
Chloride: 103 mmol/L (ref 96–112)
Creatinine, Ser: 1.36 mg/dL — ABNORMAL HIGH (ref 0.50–1.35)
GFR calc non Af Amer: 50 mL/min — ABNORMAL LOW (ref >90)
GFR, EST AFRICAN AMERICAN: 58 mL/min — AB (ref >90)
GLUCOSE: 87 mg/dL (ref 70–99)
POTASSIUM: 3.9 mmol/L (ref 3.5–5.1)
Sodium: 137 mmol/L (ref 135–145)

## 2014-07-12 LAB — TROPONIN I

## 2014-07-12 MED ORDER — METHYLPREDNISOLONE SODIUM SUCC 125 MG IJ SOLR
125.0000 mg | Freq: Once | INTRAMUSCULAR | Status: AC
  Administered 2014-07-12: 125 mg via INTRAVENOUS
  Filled 2014-07-12: qty 2

## 2014-07-12 MED ORDER — IOHEXOL 350 MG/ML SOLN
100.0000 mL | Freq: Once | INTRAVENOUS | Status: AC | PRN
Start: 2014-07-12 — End: 2014-07-12
  Administered 2014-07-12: 100 mL via INTRAVENOUS

## 2014-07-12 MED ORDER — DIPHENHYDRAMINE HCL 50 MG/ML IJ SOLN
50.0000 mg | Freq: Once | INTRAMUSCULAR | Status: AC
  Administered 2014-07-12: 50 mg via INTRAVENOUS
  Filled 2014-07-12: qty 1

## 2014-07-12 NOTE — ED Provider Notes (Signed)
CSN: [id number]     Arrival date & time 07/11/14  2145 History   First MD Initiated Contact with Patient 07/12/14 0131     Chief complaint: Short of breath  (Consider location/radiation/quality/duration/timing/severity/associated sxs/prior Treatment) The history is provided by the patient.   74 year old male has been noticing gradually progressive dyspnea on exertion over the last several months, worse over the last 2 weeks. It is not consistent but he generally gets short of breath on walking approximately 100 feet. He denies chest pain, heaviness, tightness, pressure. He denies paroxysmal nocturnal dyspnea or orthopnea. He saw his PCP earlier today and a d-dimer was obtained which was elevated so he was told to come to the ED. Of note, he is a proximally 6 weeks status post surgery on varicose veins in his legs.  Past Medical History  Diagnosis Date  . Diabetes mellitus   . Hypertension   . Stroke   . Prostate hypertrophy   . Cirrhosis of liver   . Hepatic cancer   . Varicose veins    Past Surgical History  Procedure Laterality Date  . Liver resection    . Tonsillectomy    . Eye surgery      bilateral cataract   . Appendectomy    . Left arm surgery      age 35  . Left hand surgery      tendons ripped on left hand  . Total knee arthroplasty Left 12/14/2012    Procedure: LEFT TOTAL KNEE ARTHROPLASTY;  Surgeon: Gearlean Alf, MD;  Location: [facility];  Service: Orthopedics;  Laterality: Left;   Family History  Problem Relation Age of Onset  . Stroke Father    History  Substance Use Topics  . Smoking status: Former Research scientist (life sciences)  . Smokeless tobacco: Former Systems developer    Quit date: 11/20/1983  . Alcohol Use: No    Review of Systems  All other systems reviewed and are negative.     Allergies  Iodine  Home Medications   Prior to Admission medications   Medication Sig Start Date End Date Taking? Authorizing Provider  bisacodyl (DULCOLAX) 10 MG suppository Place 1 suppository (10 mg  total) rectally daily as needed. 12/17/12   Arlee Muslim, PA-C  diphenhydrAMINE (BENADRYL) 12.5 MG/5ML elixir Take 5-10 mLs (12.5-25 mg total) by mouth every 4 (four) hours as needed for itching. 12/17/12   Arlee Muslim, PA-C  docusate sodium 100 MG CAPS Take 100 mg by mouth 2 (two) times daily. 12/17/12   Arlee Muslim, PA-C  fentaNYL (DURAGESIC - DOSED MCG/HR) 100 MCG/HR Place 1 patch onto the skin every 3 (three) days.    Historical Provider, MD  finasteride (PROSCAR) 5 MG tablet Take 5 mg by mouth daily.      Historical Provider, MD  furosemide (LASIX) 40 MG tablet Take 40 mg by mouth every morning.    Historical Provider, MD  hydrocerin (EUCERIN) CREA Apply 1 application topically daily.    Historical Provider, MD  hydrocortisone 1 % lotion Apply 1 application topically daily.    Historical Provider, MD  insulin aspart (NOVOLOG) 100 UNIT/ML injection Inject 2-10 Units into the skin 3 (three) times daily before meals. BS 150-200=2 units; 201-250=4 units; 251-300= 6 units; 301-350= 8 units; 350 and greater is 10 units    Historical Provider, MD  insulin glargine (LANTUS) 100 UNIT/ML injection Inject 50 Units into the skin every evening.     Historical Provider, MD  iron polysaccharides (NIFEREX) 150 MG capsule Take 1 capsule (  150 mg total) by mouth 2 (two) times daily. 12/17/12   Arlee Muslim, PA-C  loratadine (CLARITIN) 10 MG tablet Take 10 mg by mouth daily as needed for allergies.    Historical Provider, MD  methocarbamol (ROBAXIN) 500 MG tablet Take 1 tablet (500 mg total) by mouth every 6 (six) hours as needed. 12/17/12   Arlee Muslim, PA-C  metoCLOPramide (REGLAN) 5 MG tablet Take 1-2 tablets (5-10 mg total) by mouth every 8 (eight) hours as needed (if ondansetron (ZOFRAN) ineffective.). 12/17/12   Arlee Muslim, PA-C  ondansetron (ZOFRAN) 4 MG tablet Take 1 tablet (4 mg total) by mouth every 6 (six) hours as needed for nausea. 12/17/12   Arlee Muslim, PA-C  oxyCODONE (OXY IR/ROXICODONE) 5 MG  immediate release tablet Take 1-3 tablets (5-15 mg total) by mouth every 4 (four) hours as needed. Patient not taking: Reported on 06/06/2014 12/17/12   Arlee Muslim, PA-C  OxyCODONE (OXYCONTIN) 10 mg T12A 12 hr tablet Take one tablet by mouth every 12 hours. Do not crush Patient not taking: Reported on 06/06/2014 12/24/12   Lauree Chandler, NP  pantoprazole (PROTONIX) 40 MG tablet Take 40 mg by mouth daily.    Historical Provider, MD  polyethylene glycol (MIRALAX / GLYCOLAX) packet Take 17 g by mouth daily as needed. 12/17/12   Arlee Muslim, PA-C  polyvinyl alcohol (LIQUIFILM TEARS) 1.4 % ophthalmic solution Place 1 drop into both eyes 2 (two) times daily as needed (dry eyes).    Historical Provider, MD  pregabalin (LYRICA) 150 MG capsule Take 150 mg by mouth 2 (two) times daily.    Historical Provider, MD  senna (SENOKOT) 8.6 MG TABS tablet Take 1 tablet by mouth daily as needed. Constipation    Historical Provider, MD  silver sulfADIAZINE (SILVADENE) 1 % cream Apply topically daily. Apply to the right toe daily and cover with a dry dressing. 12/17/12   Arlee Muslim, PA-C  silver sulfADIAZINE (SILVADENE) 1 % cream Apply topically daily. Apply to right toe daily and cover with a dry dressing daily. Patient not taking: Reported on 05/09/2014 12/17/12   Arlee Muslim, PA-C  silver sulfADIAZINE (SILVADENE) 1 % cream Apply topically daily. Patient not taking: Reported on 05/09/2014 12/17/12   Arlee Muslim, PA-C  simvastatin (ZOCOR) 20 MG tablet Take 10 mg by mouth every evening.    Historical Provider, MD  spironolactone (ALDACTONE) 100 MG tablet Take 100 mg by mouth every morning.     Historical Provider, MD  tamsulosin (FLOMAX) 0.4 MG CAPS Take 0.4 mg by mouth daily after supper.     Historical Provider, MD  traMADol (ULTRAM) 50 MG tablet Take 1-2 tablets (50-100 mg total) by mouth every 6 (six) hours as needed (mild pain). 12/17/12   Arlee Muslim, PA-C  warfarin (COUMADIN) 5 MG tablet Take 1.5-2 tablets  (7.5-10 mg total) by mouth daily. Take Coumadin for three weeks for postoperative protocol and then the patient may resume their previous Coumadin home regimen.  The dose may need to be adjusted based upon the INR.  Please follow the INR and titrate Coumadin dose for a therapeutic range between 2.0 and 3.0 INR.  After three weeks of Coumadin, the patient may resume their previous Coumadin home regimen and their Plavix. 12/17/12   Arlee Muslim, PA-C   BP 113/66 mmHg  Pulse 94  Temp(Src) 97.9 F (36.6 C)  Resp 18  SpO2 95% Physical Exam  Nursing note and vitals reviewed.  Obese 74 year old male, resting comfortably and in  no acute distress. Vital signs are normal. Oxygen saturation is 95%, which is normal. Head is normocephalic and atraumatic. PERRLA, EOMI. Oropharynx is clear. Neck is nontender and supple without adenopathy. JVD is present. Back is nontender and there is no CVA tenderness. Lungs have distant breath sounds without rales, wheezes, or rhonchi. Chest is nontender. Heart has regular rate and rhythm without murmur. Abdomen is soft, flat, nontender without masses or hepatosplenomegaly and peristalsis is normoactive. Extremities have 1+ edema, full range of motion is present. Skin is warm and dry without rash. Neurologic: Mental status is normal, cranial nerves are intact, there are no motor or sensory deficits.  ED Course  Procedures (including critical care time) Labs Review Results for orders placed or performed during the hospital encounter of 58/09/98  Basic metabolic panel  Result Value Ref Range   Sodium 137 135 - 145 mmol/L   Potassium 3.9 3.5 - 5.1 mmol/L   Chloride 103 96 - 112 mmol/L   CO2 26 19 - 32 mmol/L   Glucose, Bld 87 70 - 99 mg/dL   BUN 22 6 - 23 mg/dL   Creatinine, Ser 1.36 (H) 0.50 - 1.35 mg/dL   Calcium 8.9 8.4 - 10.5 mg/dL   GFR calc non Af Amer 50 (L) >90 mL/min   GFR calc Af Amer 58 (L) >90 mL/min   Anion gap 8 5 - 15  Brain natriuretic peptide   Result Value Ref Range   B Natriuretic Peptide 97.4 0.0 - 100.0 pg/mL  Troponin I  Result Value Ref Range   Troponin I <0.03 <0.031 ng/mL  CBC with Differential  Result Value Ref Range   WBC 4.3 4.0 - 10.5 K/uL   RBC 4.22 4.22 - 5.81 MIL/uL   Hemoglobin 12.7 (L) 13.0 - 17.0 g/dL   HCT 37.9 (L) 39.0 - 52.0 %   MCV 89.8 78.0 - 100.0 fL   MCH 30.1 26.0 - 34.0 pg   MCHC 33.5 30.0 - 36.0 g/dL   RDW 15.8 (H) 11.5 - 15.5 %   Platelets 79 (L) 150 - 400 K/uL   Neutrophils Relative % 62 43 - 77 %   Neutro Abs 2.7 1.7 - 7.7 K/uL   Lymphocytes Relative 25 12 - 46 %   Lymphs Abs 1.1 0.7 - 4.0 K/uL   Monocytes Relative 11 3 - 12 %   Monocytes Absolute 0.5 0.1 - 1.0 K/uL   Eosinophils Relative 3 0 - 5 %   Eosinophils Absolute 0.1 0.0 - 0.7 K/uL   Basophils Relative 0 0 - 1 %   Basophils Absolute 0.0 0.0 - 0.1 K/uL   Imaging Review Ct Angio Chest Pe W/cm &/or Wo Cm  07/12/2014   CLINICAL DATA:  Worsening shortness of breath over 6 months. Wheezing. Elevated D-dimer.  EXAM: CT ANGIOGRAPHY CHEST WITH CONTRAST  TECHNIQUE: Multidetector CT imaging of the chest was performed using the standard protocol during bolus administration of intravenous contrast. Multiplanar CT image reconstructions and MIPs were obtained to evaluate the vascular anatomy.  CONTRAST:  135m OMNIPAQUE IOHEXOL 350 MG/ML SOLN  COMPARISON:  None.  FINDINGS: Technically adequate study with moderately good opacification of the central and segmental pulmonary arteries. Examination is technically limited due to motion artifact. Visualize central pulmonary artery demonstrate no focal filling defects. No evidence of significant pulmonary embolus.  Mild cardiac enlargement. Normal caliber thoracic aorta. Calcification in the aorta and Coronary arteries. No significant lymphadenopathy in the chest. Esophagus is decompressed.  Motion artifact limits evaluation of  the lungs. No gross consolidation, effusion, or pneumothorax. Airways appear patent.   Included portions of the upper abdominal organs demonstrate possible hepatic cirrhosis with nodular liver contour. Spleen appears enlarged. Right adrenal gland nodule measuring 3.3 cm diameter. Density measurements are consistent with fatty adenoma. Vascular calcifications in the upper abdomen. Surgical absence of the gallbladder. Degenerative changes in the spine. No destructive bone lesions.  Review of the MIP images confirms the above findings.  IMPRESSION: Technically limited study due to motion artifact. No evidence of significant pulmonary embolus. No gross parenchymal consolidation. Probable hepatic cirrhosis. Benign-appearing right adrenal gland nodule.   Electronically Signed   By: Lucienne Capers M.D.   On: 07/12/2014 03:50     EKG Interpretation   Date/Time:  Tuesday July 12 2014 01:31:46 EDT Ventricular Rate:  83 PR Interval:  294 QRS Duration: 120 QT Interval:  415 QTC Calculation: 488 R Axis:   -69 Text Interpretation:  Sinus rhythm Atrial premature complex Sinus pause  Incomplete left bundle branch block Inferior infarct, old Consider  anterior infarct First degree A-V block Left anterior fasicular block When  compared with ECG of 12/03/2012, Premature atrial complexes are now Present  Confirmed by [facility]  MD, Kristien Salatino ([id number]) on 07/12/2014 1:55:23 AM      MDM   Final diagnoses:  Dyspnea on exertion  Elevated d-dimer  Renal insufficiency  Thrombocytopenia  Normochromic normocytic anemia    Exertional dyspnea which seems more consistent with CHF and pulmonary embolism. Review of old records shows recent laser ablation of saphenous veins bilaterally with negative venous duplex scans postoperatively. CT angiogram is ordered because of elevated d-dimer. He is allergic to IV dye, so he will need pretreatment with diphenhydramine and methylprednisolone. BNP will also be checked.  CT angiogram is negative for pulmonary embolism or other pulmonary findings. BNP is normal as well.  Cause for dyspnea is not obvious. Elevated d-dimer is probably related to recent surgery for varicose veins. Patient is reassured with these findings. He has chronic number cytopenia which is unchanged from baseline, and mild renal insufficiency which is unchanged from baseline. Anemia Zacks improved over baseline. He is referred back to his primary care provider for further outpatient evaluation of his dyspnea.  Delora Fuel, MD 93/79/02 [id number]

## 2014-07-12 NOTE — Discharge Instructions (Signed)
Your tests here did not show any sign of a blood clot or heart failure. Follow-up with your primary care provider for further evaluation to try to find the cause for your difficulty breathing.  Shortness of Breath Shortness of breath means you have trouble breathing. It could also mean that you have a medical problem. You should get immediate medical care for shortness of breath. CAUSES   Not enough oxygen in the air such as with high altitudes or a smoke-filled room.  Certain lung diseases, infections, or problems.  Heart disease or conditions, such as angina or heart failure.  Low red blood cells (anemia).  Poor physical fitness, which can cause shortness of breath when you exercise.  Chest or back injuries or stiffness.  Being overweight.  Smoking.  Anxiety, which can make you feel like you are not getting enough air. DIAGNOSIS  Serious medical problems can often be found during your physical exam. Tests may also be done to determine why you are having shortness of breath. Tests may include:  Chest X-rays.  Lung function tests.  Blood tests.  An electrocardiogram (ECG).  An ambulatory electrocardiogram. An ambulatory ECG records your heartbeat patterns over a 24-hour period.  Exercise testing.  A transthoracic echocardiogram (TTE). During echocardiography, sound waves are used to evaluate how blood flows through your heart.  A transesophageal echocardiogram (TEE).  Imaging scans. Your health care provider may not be able to find a cause for your shortness of breath after your exam. In this case, it is important to have a follow-up exam with your health care provider as directed.  TREATMENT  Treatment for shortness of breath depends on the cause of your symptoms and can vary greatly. HOME CARE INSTRUCTIONS   Do not smoke. Smoking is a common cause of shortness of breath. If you smoke, ask for help to quit.  Avoid being around chemicals or things that may bother  your breathing, such as paint fumes and dust.  Rest as needed. Slowly resume your usual activities.  If medicines were prescribed, take them as directed for the full length of time directed. This includes oxygen and any inhaled medicines.  Keep all follow-up appointments as directed by your health care provider. SEEK MEDICAL CARE IF:   Your condition does not improve in the time expected.  You have a hard time doing your normal activities even with rest.  You have any new symptoms. SEEK IMMEDIATE MEDICAL CARE IF:   Your shortness of breath gets worse.  You feel light-headed, faint, or develop a cough not controlled with medicines.  You start coughing up blood.  You have pain with breathing.  You have chest pain or pain in your arms, shoulders, or abdomen.  You have a fever.  You are unable to walk up stairs or exercise the way you normally do. MAKE SURE YOU:  Understand these instructions.  Will watch your condition.  Will get help right away if you are not doing well or get worse. Document Released: 12/18/2000 Document Revised: 03/30/2013 Document Reviewed: 06/10/2011 [facility] Patient Information 2015 [place], [place]. This information is not intended to replace advice given to you by your health care provider. Make sure you discuss any questions you have with your health care provider.

## 2014-07-12 NOTE — ED Notes (Signed)
Patient states he is allergic to contrast for ct, reporting itching. MD acknowledges, pretreating for CTA.

## 2015-01-11 ENCOUNTER — Other Ambulatory Visit: Payer: Self-pay | Admitting: Internal Medicine

## 2015-01-11 DIAGNOSIS — K7460 Unspecified cirrhosis of liver: Secondary | ICD-10-CM

## 2015-01-30 ENCOUNTER — Ambulatory Visit
Admission: RE | Admit: 2015-01-30 | Discharge: 2015-01-30 | Disposition: A | Discharge status: Home / Self Care | Payer: Non-veteran care | Source: Ambulatory Visit | Attending: Internal Medicine | Admitting: Internal Medicine

## 2015-01-30 DIAGNOSIS — K7460 Unspecified cirrhosis of liver: Secondary | ICD-10-CM

## 2015-01-30 MED ORDER — GADOXETATE DISODIUM 0.25 MMOL/ML IV SOLN
10.0000 mL | Freq: Once | INTRAVENOUS | Status: AC | PRN
  Administered 2015-01-30: 10 mL via INTRAVENOUS

## 2015-06-14 DIAGNOSIS — E669 Obesity, unspecified: Secondary | ICD-10-CM | POA: Diagnosis not present

## 2015-06-14 DIAGNOSIS — C228 Malignant neoplasm of liver, primary, unspecified as to type: Secondary | ICD-10-CM | POA: Diagnosis not present

## 2015-06-14 DIAGNOSIS — K7460 Unspecified cirrhosis of liver: Secondary | ICD-10-CM | POA: Diagnosis not present

## 2015-06-14 DIAGNOSIS — E119 Type 2 diabetes mellitus without complications: Secondary | ICD-10-CM | POA: Diagnosis not present

## 2015-06-14 DIAGNOSIS — K7581 Nonalcoholic steatohepatitis (NASH): Secondary | ICD-10-CM | POA: Diagnosis not present

## 2015-06-14 DIAGNOSIS — C8298 Follicular lymphoma, unspecified, lymph nodes of multiple sites: Secondary | ICD-10-CM | POA: Diagnosis not present

## 2015-06-14 DIAGNOSIS — K766 Portal hypertension: Secondary | ICD-10-CM | POA: Diagnosis not present

## 2015-06-14 DIAGNOSIS — Z9221 Personal history of antineoplastic chemotherapy: Secondary | ICD-10-CM | POA: Diagnosis not present

## 2015-06-26 DIAGNOSIS — Z9049 Acquired absence of other specified parts of digestive tract: Secondary | ICD-10-CM | POA: Diagnosis not present

## 2015-06-26 DIAGNOSIS — R600 Localized edema: Secondary | ICD-10-CM | POA: Diagnosis not present

## 2015-06-26 DIAGNOSIS — Z79899 Other long term (current) drug therapy: Secondary | ICD-10-CM | POA: Diagnosis not present

## 2015-06-26 DIAGNOSIS — Z87891 Personal history of nicotine dependence: Secondary | ICD-10-CM | POA: Diagnosis not present

## 2015-06-26 DIAGNOSIS — K766 Portal hypertension: Secondary | ICD-10-CM | POA: Diagnosis not present

## 2015-06-26 DIAGNOSIS — C220 Liver cell carcinoma: Secondary | ICD-10-CM | POA: Diagnosis not present

## 2015-06-26 DIAGNOSIS — D696 Thrombocytopenia, unspecified: Secondary | ICD-10-CM | POA: Diagnosis not present

## 2015-06-26 DIAGNOSIS — K7581 Nonalcoholic steatohepatitis (NASH): Secondary | ICD-10-CM | POA: Diagnosis not present

## 2015-06-26 DIAGNOSIS — K7469 Other cirrhosis of liver: Secondary | ICD-10-CM | POA: Diagnosis not present

## 2015-06-26 DIAGNOSIS — K760 Fatty (change of) liver, not elsewhere classified: Secondary | ICD-10-CM | POA: Diagnosis not present

## 2015-06-26 DIAGNOSIS — R161 Splenomegaly, not elsewhere classified: Secondary | ICD-10-CM | POA: Diagnosis not present

## 2015-06-27 DIAGNOSIS — K7460 Unspecified cirrhosis of liver: Secondary | ICD-10-CM | POA: Diagnosis not present

## 2015-06-27 DIAGNOSIS — K766 Portal hypertension: Secondary | ICD-10-CM | POA: Diagnosis not present

## 2015-06-27 DIAGNOSIS — R161 Splenomegaly, not elsewhere classified: Secondary | ICD-10-CM | POA: Diagnosis not present

## 2015-06-27 DIAGNOSIS — D3500 Benign neoplasm of unspecified adrenal gland: Secondary | ICD-10-CM | POA: Diagnosis not present

## 2015-06-27 DIAGNOSIS — D3501 Benign neoplasm of right adrenal gland: Secondary | ICD-10-CM | POA: Diagnosis not present

## 2015-06-27 DIAGNOSIS — I868 Varicose veins of other specified sites: Secondary | ICD-10-CM | POA: Diagnosis not present

## 2015-06-27 DIAGNOSIS — K769 Liver disease, unspecified: Secondary | ICD-10-CM | POA: Diagnosis not present

## 2015-07-03 DIAGNOSIS — E119 Type 2 diabetes mellitus without complications: Secondary | ICD-10-CM | POA: Diagnosis not present

## 2015-07-07 DIAGNOSIS — K7460 Unspecified cirrhosis of liver: Secondary | ICD-10-CM | POA: Diagnosis not present

## 2015-07-07 DIAGNOSIS — I8510 Secondary esophageal varices without bleeding: Secondary | ICD-10-CM | POA: Diagnosis not present

## 2015-07-07 DIAGNOSIS — E785 Hyperlipidemia, unspecified: Secondary | ICD-10-CM | POA: Diagnosis not present

## 2015-07-07 DIAGNOSIS — E669 Obesity, unspecified: Secondary | ICD-10-CM | POA: Diagnosis not present

## 2015-07-07 DIAGNOSIS — K2970 Gastritis, unspecified, without bleeding: Secondary | ICD-10-CM | POA: Diagnosis not present

## 2015-07-07 DIAGNOSIS — Z6841 Body Mass Index (BMI) 40.0 and over, adult: Secondary | ICD-10-CM | POA: Diagnosis not present

## 2015-07-07 DIAGNOSIS — K766 Portal hypertension: Secondary | ICD-10-CM | POA: Diagnosis not present

## 2015-07-07 DIAGNOSIS — G629 Polyneuropathy, unspecified: Secondary | ICD-10-CM | POA: Diagnosis not present

## 2015-07-07 DIAGNOSIS — K3189 Other diseases of stomach and duodenum: Secondary | ICD-10-CM | POA: Diagnosis not present

## 2015-07-07 DIAGNOSIS — M1990 Unspecified osteoarthritis, unspecified site: Secondary | ICD-10-CM | POA: Diagnosis not present

## 2015-07-07 DIAGNOSIS — K228 Other specified diseases of esophagus: Secondary | ICD-10-CM | POA: Diagnosis not present

## 2015-07-17 DIAGNOSIS — E113593 Type 2 diabetes mellitus with proliferative diabetic retinopathy without macular edema, bilateral: Secondary | ICD-10-CM | POA: Diagnosis not present

## 2015-07-17 DIAGNOSIS — H43811 Vitreous degeneration, right eye: Secondary | ICD-10-CM | POA: Diagnosis not present

## 2015-07-17 DIAGNOSIS — H353131 Nonexudative age-related macular degeneration, bilateral, early dry stage: Secondary | ICD-10-CM | POA: Diagnosis not present

## 2015-07-17 DIAGNOSIS — H4720 Unspecified optic atrophy: Secondary | ICD-10-CM | POA: Diagnosis not present

## 2015-08-08 DIAGNOSIS — I10 Essential (primary) hypertension: Secondary | ICD-10-CM | POA: Diagnosis not present

## 2015-08-08 DIAGNOSIS — R946 Abnormal results of thyroid function studies: Secondary | ICD-10-CM | POA: Diagnosis not present

## 2015-08-08 DIAGNOSIS — Z6841 Body Mass Index (BMI) 40.0 and over, adult: Secondary | ICD-10-CM | POA: Diagnosis not present

## 2015-08-08 DIAGNOSIS — E11319 Type 2 diabetes mellitus with unspecified diabetic retinopathy without macular edema: Secondary | ICD-10-CM | POA: Diagnosis not present

## 2015-08-08 DIAGNOSIS — K769 Liver disease, unspecified: Secondary | ICD-10-CM | POA: Diagnosis not present

## 2015-08-08 DIAGNOSIS — Z794 Long term (current) use of insulin: Secondary | ICD-10-CM | POA: Diagnosis not present

## 2015-08-21 ENCOUNTER — Ambulatory Visit: Payer: Medicare Other | Attending: Internal Medicine | Admitting: Physical Therapy

## 2015-08-21 ENCOUNTER — Encounter: Payer: Self-pay | Admitting: Physical Therapy

## 2015-08-21 DIAGNOSIS — M6281 Muscle weakness (generalized): Secondary | ICD-10-CM | POA: Insufficient documentation

## 2015-08-21 DIAGNOSIS — R262 Difficulty in walking, not elsewhere classified: Secondary | ICD-10-CM | POA: Diagnosis not present

## 2015-08-21 NOTE — Therapy (Signed)
[address] [place], [place], [address] Phone: [phone]   Fax:  [phone]  Physical Therapy Evaluation  Patient Details  Name: Keith [name] MRN: [id number] Date of Birth: 1940-04-14 Referring Provider: Domenick Gong  Encounter Date: 08/21/2015      PT End of Session - 08/21/15 1002    Visit Number 1   Date for PT Re-Evaluation 10/21/15   PT Start Time 0929   PT Stop Time 1023   PT Time Calculation (min) 54 min   Activity Tolerance Patient tolerated treatment well   Behavior During Therapy [facility] for tasks assessed/performed      Past Medical History  Diagnosis Date  . Diabetes mellitus   . Hypertension   . Stroke ([place])   . Prostate hypertrophy   . Cirrhosis of liver ([place])   . Hepatic cancer ([place])   . Varicose veins     Past Surgical History  Procedure Laterality Date  . Liver resection    . Tonsillectomy    . Eye surgery      bilateral cataract   . Appendectomy    . Left arm surgery      age 17  . Left hand surgery      tendons ripped on left hand  . Total knee arthroplasty Left 12/14/2012    Procedure: LEFT TOTAL KNEE ARTHROPLASTY;  Surgeon: Gearlean Alf, MD;  Location: [facility];  Service: Orthopedics;  Laterality: Left;    There were no vitals filed for this visit.       Subjective Assessment - 08/21/15 0929    Subjective Patient reports that over the past year he has had increased difficulty walking.  He reports that he has thigh pain, he reports that over the past year he has not exercises and has been more confined to a chair.   Limitations Walking   How long can you walk comfortably? 125 feet max, will need to rest   Currently in Pain? Yes   Pain Score 5    Pain Location Leg   Pain Orientation Right;Left   Pain Descriptors / Indicators Aching   Pain Type Neuropathic pain   Pain Onset More than a month ago   Pain Frequency Constant   Aggravating Factors   sitting in recliner or lying pain can go up to 9/10   Pain Relieving Factors massage the legs down to 3/10    Effect of Pain on Daily Activities difficulty relaxing and to have no pain            [facility] PT Assessment - 08/21/15 0001    Assessment   Medical Diagnosis difficulty walking   Referring Provider Candido Tisovec   Onset Date/Surgical Date 07/22/15   Prior Therapy after TKR left   Precautions   Precautions None   Balance Screen   Has the patient fallen in the past 6 months No   Has the patient had a decrease in activity level because of a fear of falling?  No   Is the patient reluctant to leave their home because of a fear of falling?  No   Home Environment   Additional Comments 2 steps into the home, he does the housework   Prior Function   Level of Independence Independent with household mobility with device;Independent with community mobility with device   Leisure no exercise   ROM / Strength   AROM / PROM / Strength AROM;Strength   AROM  Overall AROM Comments AROM of the knees 0-90 degrees bilaterally with pain and tightness in the quads with knee flexion, hip flexion to 60 degrees bilaterally, ankle DF to 5 degrees bilaterally   Strength   Overall Strength Comments LE strength is 3+/5 some pain in the quds   Flexibility   Soft Tissue Assessment /Muscle Length --  very tight calves, HS, ITB and quads   Palpation   Palpation comment very tight in the quads, this is where he has the most pain, he is tight and tender in the calves and shins   Ambulation/Gait   Gait Comments uses a SPC, slow, WBOS   Standardized Balance Assessment   Standardized Balance Assessment Timed Up and Go Test   Timed Up and Go Test   Normal TUG (seconds) 35                   [facility] Adult PT Treatment/Exercise - 08/21/15 0001    Modalities   Modalities Electrical Stimulation;Moist Heat   Moist Heat Therapy   Number Minutes Moist Heat 15 Minutes   Moist Heat Location Knee    Electrical Stimulation   Electrical Stimulation Location bilateral thighs   Electrical Stimulation Action premod   Electrical Stimulation Parameters supine   Electrical Stimulation Goals Pain                  PT Short Term Goals - 08/21/15 1006    PT SHORT TERM GOAL #1   Title indepednent with an exercise program at home   Time 2   Period Weeks   Status New           PT Long Term Goals - 08/21/15 1006    PT LONG TERM GOAL #1   Title decrease TUG time to 23 seconds   Time 8   Period Weeks   Status New   PT LONG TERM GOAL #2   Title report able to walk 300 feet without rest   Time 8   Period Weeks   Status New   PT LONG TERM GOAL #3   Title report pain decreased 50% with sitting in recliner   Time 8   Period Weeks   Status New   PT LONG TERM GOAL #4   Title be independent with gym exercises   Time 8   Period Weeks   Status New               Plan - 08/21/15 1003    Clinical Impression Statement Patient with long standing LE neuropathy, made worse recently by chemo treatments, he reports that  over the past year he has had increased difficulty wallking, he reports that he has stopped exercising and has had more pain in the legs and difficulty sleeping.  Timed up and go test was 35 seconds   Rehab Potential Good   PT Frequency 2x / week   PT Duration 8 weeks   PT Treatment/Interventions ADLs/Self Care Home Management;Electrical Stimulation;Moist Heat;Therapeutic exercise;Therapeutic activities;Functional mobility training;Gait training;Ultrasound;Balance training;Neuromuscular re-education;Patient/family education;Manual techniques   PT Next Visit Plan we will try to get him exercising again and safer wtih gait, could try rolling the thighs   Consulted and Agree with Plan of Care Patient      Patient will benefit from skilled therapeutic intervention in order to improve the following deficits and impairments:  Abnormal gait, Cardiopulmonary status  limiting activity, Decreased activity tolerance, Decreased balance, Decreased mobility, Decreased range of motion, Decreased strength, Difficulty walking,  Increased fascial restricitons, Increased muscle spasms, Impaired flexibility, Improper body mechanics, Pain  Visit Diagnosis: Difficulty in walking, not elsewhere classified - Plan: PT plan of care cert/re-cert  Muscle weakness (generalized) - Plan: PT plan of care cert/re-cert      G-Codes - 81/84/03 1007    Functional Assessment Tool Used Foto 64% limitation   Functional Limitation Mobility: Walking and moving around   Mobility: Walking and Moving Around Current Status [phone]) At least 60 percent but less than 80 percent impaired, limited or restricted   Mobility: Walking and Moving Around Goal Status [phone]) At least 40 percent but less than 60 percent impaired, limited or restricted       Problem List Patient Active Problem List   Diagnosis Date Noted  . Knee pain, acute 01/26/2013  . Constipation 12/22/2012  . Neuropathy ([place]) 12/22/2012  . Prostate hypertrophy 12/22/2012  . Acute blood loss anemia 12/22/2012  . Hyperlipidemia 12/22/2012  . Diabetes ([place]) 12/17/2012  . GERD (gastroesophageal reflux disease) 12/17/2012  . Hypertension 12/17/2012  . Thrombocytopenia, unspecified ([place]) 12/17/2012  . Postoperative anemia due to acute blood loss 12/15/2012  . Hyponatremia 12/15/2012  . OA (osteoarthritis) of knee 12/14/2012    [name], PT 08/21/2015, 10:12 AM  [place] [address] [place], [place], [address] Phone: [phone]   Fax:  [phone]  Name: ABSHIR PAOLINI MRN: [id number] Date of Birth: Jul 02, 1940

## 2015-08-30 ENCOUNTER — Encounter: Payer: Self-pay | Admitting: Physical Therapy

## 2015-08-30 ENCOUNTER — Ambulatory Visit: Payer: Medicare Other | Admitting: Physical Therapy

## 2015-08-30 DIAGNOSIS — M6281 Muscle weakness (generalized): Secondary | ICD-10-CM

## 2015-08-30 DIAGNOSIS — R262 Difficulty in walking, not elsewhere classified: Secondary | ICD-10-CM

## 2015-08-30 NOTE — Therapy (Signed)
[place], [place], [address] Phone: [phone]   Fax:  [phone]  Physical Therapy Treatment  Patient Details  Name: Keith [name] MRN: [id number] Date of Birth: 1940-10-21 Referring Provider: Domenick Gong  Encounter Date: 08/30/2015      PT End of Session - 08/30/15 1403    Visit Number 2   Date for PT Re-Evaluation 10/21/15   PT Start Time 7062   PT Stop Time 1422   PT Time Calculation (min) 65 min   Activity Tolerance Patient tolerated treatment well   Behavior During Therapy [facility] for tasks assessed/performed      Past Medical History  Diagnosis Date  . Diabetes mellitus   . Hypertension   . Stroke ([place])   . Prostate hypertrophy   . Cirrhosis of liver ([place])   . Hepatic cancer ([place])   . Varicose veins     Past Surgical History  Procedure Laterality Date  . Liver resection    . Tonsillectomy    . Eye surgery      bilateral cataract   . Appendectomy    . Left arm surgery      age 75  . Left hand surgery      tendons ripped on left hand  . Total knee arthroplasty Left 12/14/2012    Procedure: LEFT TOTAL KNEE ARTHROPLASTY;  Surgeon: Gearlean Alf, MD;  Location: [facility];  Service: Orthopedics;  Laterality: Left;    There were no vitals filed for this visit.      Subjective Assessment - 08/30/15 1320    Subjective Patient reports he felt pretty good after the last visit, except some pain in the lateral left thigh    Currently in Pain? Yes   Pain Score 5    Pain Location Leg   Pain Orientation Right;Left;Anterior;Lateral   Pain Descriptors / Indicators Aching;Tightness   Pain Type Neuropathic pain   Aggravating Factors  sitting                         [facility] Adult PT Treatment/Exercise - 08/30/15 0001    Exercises   Exercises Knee/Hip   Knee/Hip Exercises: Stretches   Passive Hamstring Stretch 3 reps;20 seconds   Hip Flexor Stretch 3  reps;20 seconds   Gastroc Stretch 3 reps;20 seconds   Knee/Hip Exercises: Aerobic   Nustep Level 4 x 5 minutes   Knee/Hip Exercises: Machines for Strengthening   Cybex Knee Extension 5# 2x15 this caused him pain in the left anterior and lateral thigh, needed to rest after about every 6-7 reps   Cybex Knee Flexion 20# 2x15   Hip Cybex hip press down    Knee/Hip Exercises: Standing   Other Standing Knee Exercises seated ankle motions with foot on the sit fit   Moist Heat Therapy   Number Minutes Moist Heat 15 Minutes   Moist Heat Location Knee   Electrical Stimulation   Electrical Stimulation Location bilateral thighs   Electrical Stimulation Action premod   Electrical Stimulation Parameters supine   Electrical Stimulation Goals Pain   Manual Therapy   Manual Therapy Myofascial release   Myofascial Release use of roller on bilateral thighs and ITB's                  PT Short Term Goals - 08/21/15 1006    PT SHORT TERM GOAL #1   Title indepednent with an exercise program  at home   Time 2   Period Weeks   Status New           PT Long Term Goals - 08/21/15 1006    PT LONG TERM GOAL #1   Title decrease TUG time to 23 seconds   Time 8   Period Weeks   Status New   PT LONG TERM GOAL #2   Title report able to walk 300 feet without rest   Time 8   Period Weeks   Status New   PT LONG TERM GOAL #3   Title report pain decreased 50% with sitting in recliner   Time 8   Period Weeks   Status New   PT LONG TERM GOAL #4   Title be independent with gym exercises   Time 8   Period Weeks   Status New               Plan - 08/30/15 1404    Clinical Impression Statement Patient unsteady at times and needs HHA to get from machines.  He is vrery tight in the quads and the ITB's.  tolerated the exercises well.   PT Next Visit Plan we will try to get him exercising again and safer wtih gait   Consulted and Agree with Plan of Care Patient      Patient will  benefit from skilled therapeutic intervention in order to improve the following deficits and impairments:  Abnormal gait, Cardiopulmonary status limiting activity, Decreased activity tolerance, Decreased balance, Decreased mobility, Decreased range of motion, Decreased strength, Difficulty walking, Increased fascial restricitons, Increased muscle spasms, Impaired flexibility, Improper body mechanics, Pain  Visit Diagnosis: Difficulty in walking, not elsewhere classified  Muscle weakness (generalized)     Problem List Patient Active Problem List   Diagnosis Date Noted  . Knee pain, acute 01/26/2013  . Constipation 12/22/2012  . Neuropathy ([place]) 12/22/2012  . Prostate hypertrophy 12/22/2012  . Acute blood loss anemia 12/22/2012  . Hyperlipidemia 12/22/2012  . Diabetes ([place]) 12/17/2012  . GERD (gastroesophageal reflux disease) 12/17/2012  . Hypertension 12/17/2012  . Thrombocytopenia, unspecified ([place]) 12/17/2012  . Postoperative anemia due to acute blood loss 12/15/2012  . Hyponatremia 12/15/2012  . OA (osteoarthritis) of knee 12/14/2012    [name], PT 08/30/2015, 2:06 PM  [address] [place], [place], [address] Phone: [phone]   Fax:  [phone]  Name: NIKOLUS MARCZAK MRN: [id number] Date of Birth: 1940/10/07

## 2015-09-01 ENCOUNTER — Ambulatory Visit: Payer: Medicare Other | Admitting: Physical Therapy

## 2015-09-01 ENCOUNTER — Encounter: Payer: Self-pay | Admitting: Physical Therapy

## 2015-09-01 DIAGNOSIS — R262 Difficulty in walking, not elsewhere classified: Secondary | ICD-10-CM

## 2015-09-01 DIAGNOSIS — M6281 Muscle weakness (generalized): Secondary | ICD-10-CM

## 2015-09-01 NOTE — Therapy (Signed)
[place], [place], [address] Phone: [phone]   Fax:  [phone]  Physical Therapy Treatment  Patient Details  Name: Keith [name] MRN: [id number] Date of Birth: 1940-11-23 Referring Provider: Domenick Gong  Encounter Date: 09/01/2015      PT End of Session - 09/01/15 0842    Visit Number 3   Date for PT Re-Evaluation 10/21/15   PT Start Time 0800   PT Stop Time 0900   PT Time Calculation (min) 60 min   Activity Tolerance Patient tolerated treatment well   Behavior During Therapy [facility] for tasks assessed/performed      Past Medical History  Diagnosis Date  . Diabetes mellitus   . Hypertension   . Stroke ([place])   . Prostate hypertrophy   . Cirrhosis of liver ([place])   . Hepatic cancer ([place])   . Varicose veins     Past Surgical History  Procedure Laterality Date  . Liver resection    . Tonsillectomy    . Eye surgery      bilateral cataract   . Appendectomy    . Left arm surgery      age 75  . Left hand surgery      tendons ripped on left hand  . Total knee arthroplasty Left 12/14/2012    Procedure: LEFT TOTAL KNEE ARTHROPLASTY;  Surgeon: Gearlean Alf, MD;  [facility];  Service: Orthopedics;  Laterality: Left;    There were no vitals filed for this visit.      Subjective Assessment - 09/01/15 0802    Subjective Patient reports that he felt really good after leaving here last visit, but that night pain in legs was back   Currently in Pain? Yes   Pain Score 6    Pain Location Leg   Pain Orientation Right;Left;Anterior;Lateral   Aggravating Factors  sitting, walking   Pain Relieving Factors Massage                         [facility] Adult PT Treatment/Exercise - 09/01/15 0001    Knee/Hip Exercises: Stretches   Passive Hamstring Stretch 3 reps;20 seconds   Hip Flexor Stretch 3 reps;20 seconds   Gastroc Stretch 3 reps;20 seconds   Knee/Hip  Exercises: Aerobic   Nustep Level 5 x 5 minutes   Knee/Hip Exercises: Machines for Strengthening   Cybex Knee Extension 5# 2x15 this caused him pain in the left anterior and lateral thigh, needed to rest after about every 6-7 reps   Cybex Knee Flexion 35# 2x15   Hip Cybex hip press down total 40# with pulleys   Knee/Hip Exercises: Standing   Other Standing Knee Exercises seated ankle motions with foot on the sit fit   Moist Heat Therapy   Number Minutes Moist Heat 15 Minutes   Moist Heat Location Knee   Electrical Stimulation   Electrical Stimulation Location bilateral thighs   Electrical Stimulation Action premod   Electrical Stimulation Parameters supine   Electrical Stimulation Goals Pain   Manual Therapy   Manual Therapy Myofascial release   Myofascial Release use of roller on bilateral thighs and ITB's                  PT Short Term Goals - 09/01/15 0844    PT SHORT TERM GOAL #1   Title indepednent with an exercise program at home   Status Achieved  PT Long Term Goals - 08/21/15 1006    PT LONG TERM GOAL #1   Title decrease TUG time to 23 seconds   Time 8   Period Weeks   Status New   PT LONG TERM GOAL #2   Title report able to walk 300 feet without rest   Time 8   Period Weeks   Status New   PT LONG TERM GOAL #3   Title report pain decreased 50% with sitting in recliner   Time 8   Period Weeks   Status New   PT LONG TERM GOAL #4   Title be independent with gym exercises   Time 8   Period Weeks   Status New               Plan - 09/01/15 7215    Clinical Impression Statement Very tight in the quads and ITB's.  First few steps after sitting are shuffling and requires him using furniture to steady himself   PT Next Visit Plan we will try to get him exercising again and safer wtih gait   Consulted and Agree with Plan of Care Patient      Patient will benefit from skilled therapeutic intervention in order to improve the  following deficits and impairments:  Abnormal gait, Cardiopulmonary status limiting activity, Decreased activity tolerance, Decreased balance, Decreased mobility, Decreased range of motion, Decreased strength, Difficulty walking, Increased fascial restricitons, Increased muscle spasms, Impaired flexibility, Improper body mechanics, Pain  Visit Diagnosis: Difficulty in walking, not elsewhere classified  Muscle weakness (generalized)     Problem List Patient Active Problem List   Diagnosis Date Noted  . Knee pain, acute 01/26/2013  . Constipation 12/22/2012  . Neuropathy ([place]) 12/22/2012  . Prostate hypertrophy 12/22/2012  . Acute blood loss anemia 12/22/2012  . Hyperlipidemia 12/22/2012  . Diabetes ([place]) 12/17/2012  . GERD (gastroesophageal reflux disease) 12/17/2012  . Hypertension 12/17/2012  . Thrombocytopenia, unspecified ([place]) 12/17/2012  . Postoperative anemia due to acute blood loss 12/15/2012  . Hyponatremia 12/15/2012  . OA (osteoarthritis) of knee 12/14/2012    [name], PT 09/01/2015, 8:45 AM  [facility] [address] [place], [place], [address] Phone: [phone]   Fax:  [phone]  Name: KUBA SHEPHERD MRN: [id number] Date of Birth: Oct 08, 1940

## 2015-09-06 ENCOUNTER — Ambulatory Visit: Payer: Medicare Other | Admitting: Physical Therapy

## 2015-09-06 ENCOUNTER — Encounter: Payer: Self-pay | Admitting: Physical Therapy

## 2015-09-06 DIAGNOSIS — R262 Difficulty in walking, not elsewhere classified: Secondary | ICD-10-CM

## 2015-09-06 DIAGNOSIS — M6281 Muscle weakness (generalized): Secondary | ICD-10-CM

## 2015-09-06 NOTE — Therapy (Signed)
[place], [place], [address] Phone: [phone]   Fax:  [phone]  Physical Therapy Treatment  Patient Details  Name: Keith [name] MRN: [id number] Date of Birth: July 15, 1940 Referring Provider: Domenick Gong  Encounter Date: 09/06/2015      PT End of Session - 09/06/15 0954    Visit Number 4   Date for PT Re-Evaluation 10/21/15   PT Start Time 0930   PT Stop Time 1028   PT Time Calculation (min) 58 min   Activity Tolerance Patient tolerated treatment well   Behavior During Therapy [facility] for tasks assessed/performed      Past Medical History  Diagnosis Date  . Diabetes mellitus   . Hypertension   . Stroke ([place])   . Prostate hypertrophy   . Cirrhosis of liver ([place])   . Hepatic cancer ([place])   . Varicose veins     Past Surgical History  Procedure Laterality Date  . Liver resection    . Tonsillectomy    . Eye surgery      bilateral cataract   . Appendectomy    . Left arm surgery      age 57  . Left hand surgery      tendons ripped on left hand  . Total knee arthroplasty Left 12/14/2012    Procedure: LEFT TOTAL KNEE ARTHROPLASTY;  Surgeon: Gearlean Alf, MD;  [facility];  Service: Orthopedics;  Laterality: Left;    There were no vitals filed for this visit.      Subjective Assessment - 09/06/15 0933    Subjective Reports that he gets good relief after treatment here but it last a day and then all of the tightness is back   Currently in Pain? Yes   Pain Score 6    Pain Location Leg   Pain Orientation Right;Left;Anterior;Lateral                         [facility] Adult PT Treatment/Exercise - 09/06/15 0001    Knee/Hip Exercises: Stretches   Passive Hamstring Stretch 3 reps;20 seconds   Quad Stretch 3 reps;20 seconds   Hip Flexor Stretch 3 reps;20 seconds   Gastroc Stretch 3 reps;20 seconds   Knee/Hip Exercises: Aerobic   Nustep level 4 x 6  minutes   Knee/Hip Exercises: Machines for Strengthening   Cybex Knee Extension 5# 2x15 this caused him pain in the left anterior and lateral thigh, needed to rest after about every 6-7 reps   Cybex Knee Flexion 35# 2x15   Cybex Leg Press 20# 3x10   Hip Cybex hip press down total 40# with pulleys   Other Machine seated row 25#, lats 25#, chest press 5#   Moist Heat Therapy   Number Minutes Moist Heat 15 Minutes   Moist Heat Location Knee   Electrical Stimulation   Electrical Stimulation Location bilateral thighs   Electrical Stimulation Action premod   Electrical Stimulation Parameters supine   Electrical Stimulation Goals Pain   Manual Therapy   Manual Therapy Myofascial release   Myofascial Release use of roller on bilateral thighs and ITB's                  PT Short Term Goals - 09/01/15 0844    PT SHORT TERM GOAL #1   Title indepednent with an exercise program at home   Status Achieved  PT Long Term Goals - 09/06/15 0956    PT LONG TERM GOAL #2   Title report able to walk 300 feet without rest   Status On-going               Plan - 09/06/15 0955    Clinical Impression Statement he is starting to understand the machines and how to adjust them, thighs are full of knots and tightness, they remain very sore   PT Next Visit Plan we will try to get him exercising again and safer wtih gait   Consulted and Agree with Plan of Care Patient      Patient will benefit from skilled therapeutic intervention in order to improve the following deficits and impairments:  Abnormal gait, Cardiopulmonary status limiting activity, Decreased activity tolerance, Decreased balance, Decreased mobility, Decreased range of motion, Decreased strength, Difficulty walking, Increased fascial restricitons, Increased muscle spasms, Impaired flexibility, Improper body mechanics, Pain  Visit Diagnosis: Difficulty in walking, not elsewhere classified  Muscle weakness  (generalized)     Problem List Patient Active Problem List   Diagnosis Date Noted  . Knee pain, acute 01/26/2013  . Constipation 12/22/2012  . Neuropathy ([place]) 12/22/2012  . Prostate hypertrophy 12/22/2012  . Acute blood loss anemia 12/22/2012  . Hyperlipidemia 12/22/2012  . Diabetes ([place]) 12/17/2012  . GERD (gastroesophageal reflux disease) 12/17/2012  . Hypertension 12/17/2012  . Thrombocytopenia, unspecified ([place]) 12/17/2012  . Postoperative anemia due to acute blood loss 12/15/2012  . Hyponatremia 12/15/2012  . OA (osteoarthritis) of knee 12/14/2012    [name], PT 09/06/2015, 9:57 AM  [place] [address] [address] [place], [place], [address] Phone: [phone]   Fax:  [phone]  Name: Keith [name] MRN: [id number] Date of Birth: 07-13-1940

## 2015-09-08 ENCOUNTER — Ambulatory Visit: Payer: Medicare Other | Attending: Internal Medicine | Admitting: Physical Therapy

## 2015-09-08 ENCOUNTER — Encounter: Payer: Self-pay | Admitting: Physical Therapy

## 2015-09-08 DIAGNOSIS — R262 Difficulty in walking, not elsewhere classified: Secondary | ICD-10-CM | POA: Insufficient documentation

## 2015-09-08 DIAGNOSIS — M6281 Muscle weakness (generalized): Secondary | ICD-10-CM | POA: Diagnosis not present

## 2015-09-08 NOTE — Therapy (Signed)
[place], [place], [address] Phone: [phone]   Fax:  [phone]  Physical Therapy Treatment  Patient Details  Name: Keith [name] MRN: [id number] Date of Birth: 09/13/1940 Referring Provider: Domenick Gong  Encounter Date: 09/08/2015      PT End of Session - 09/08/15 0917    Visit Number 5   Date for PT Re-Evaluation 10/21/15   PT Start Time 0800   PT Stop Time 0900   PT Time Calculation (min) 60 min   Activity Tolerance Patient tolerated treatment well   Behavior During Therapy [facility] for tasks assessed/performed      Past Medical History  Diagnosis Date  . Diabetes mellitus   . Hypertension   . Stroke ([place])   . Prostate hypertrophy   . Cirrhosis of liver ([place])   . Hepatic cancer ([place])   . Varicose veins     Past Surgical History  Procedure Laterality Date  . Liver resection    . Tonsillectomy    . Eye surgery      bilateral cataract   . Appendectomy    . Left arm surgery      age 75  . Left hand surgery      tendons ripped on left hand  . Total knee arthroplasty Left 12/14/2012    Procedure: LEFT TOTAL KNEE ARTHROPLASTY;  Surgeon: Gearlean Alf, MD;  [facility];  Service: Orthopedics;  Laterality: Left;    There were no vitals filed for this visit.      Subjective Assessment - 09/08/15 0814    Subjective Reports that the legs really hurt after the last treatment   Currently in Pain? Yes   Pain Score 6    Pain Location Leg   Pain Orientation Right;Left                         [facility] Adult PT Treatment/Exercise - 09/08/15 0001    Knee/Hip Exercises: Stretches   Passive Hamstring Stretch 3 reps;20 seconds   Quad Stretch 3 reps;20 seconds   Hip Flexor Stretch 3 reps;20 seconds   Gastroc Stretch 3 reps;20 seconds   Knee/Hip Exercises: Aerobic   Nustep level 4 x 6 minutes   Knee/Hip Exercises: Machines for Strengthening   Cybex Knee  Extension 5# 2x15   Cybex Knee Flexion 25# 2x15   Cybex Leg Press 20# 3x10   Other Machine seated row 25#, lats 25#, chest press 5#   Knee/Hip Exercises: Supine   Other Supine Knee/Hip Exercises feet on ball ball K2C, trunk rotation and small bridges   Moist Heat Therapy   Number Minutes Moist Heat 15 Minutes   Moist Heat Location Knee   Electrical Stimulation   Electrical Stimulation Location bilateral thighs   Electrical Stimulation Action premod   Electrical Stimulation Parameters supine   Electrical Stimulation Goals Pain                  PT Short Term Goals - 09/01/15 0844    PT SHORT TERM GOAL #1   Title indepednent with an exercise program at home   Status Achieved           PT Long Term Goals - 09/08/15 0919    PT LONG TERM GOAL #1   Title decrease TUG time to 23 seconds   Status On-going   PT LONG TERM GOAL #3   Title report pain decreased  50% with sitting in recliner   Status On-going   PT LONG TERM GOAL #4   Title be independent with gym exercises   Status On-going               Plan - 09/08/15 2505    Clinical Impression Statement Thighs continue to cause a problem with pain and tightness, moving better at times but the first few steps after siggin are unsteady.   PT Next Visit Plan we will try to get him exercising again and safer wtih gait   Consulted and Agree with Plan of Care Patient      Patient will benefit from skilled therapeutic intervention in order to improve the following deficits and impairments:  Abnormal gait, Cardiopulmonary status limiting activity, Decreased activity tolerance, Decreased balance, Decreased mobility, Decreased range of motion, Decreased strength, Difficulty walking, Increased fascial restricitons, Increased muscle spasms, Impaired flexibility, Improper body mechanics, Pain  Visit Diagnosis: Difficulty in walking, not elsewhere classified  Muscle weakness (generalized)     Problem List Patient  Active Problem List   Diagnosis Date Noted  . Knee pain, acute 01/26/2013  . Constipation 12/22/2012  . Neuropathy ([place]) 12/22/2012  . Prostate hypertrophy 12/22/2012  . Acute blood loss anemia 12/22/2012  . Hyperlipidemia 12/22/2012  . Diabetes ([place]) 12/17/2012  . GERD (gastroesophageal reflux disease) 12/17/2012  . Hypertension 12/17/2012  . Thrombocytopenia, unspecified ([place]) 12/17/2012  . Postoperative anemia due to acute blood loss 12/15/2012  . Hyponatremia 12/15/2012  . OA (osteoarthritis) of knee 12/14/2012    [name], PT 09/08/2015, 9:20 AM  [place] [address] [place], [place], [address] Phone: [phone]   Fax:  [phone]  Name: Keith [name] MRN: [id number] Date of Birth: 07-26-1940

## 2015-09-12 ENCOUNTER — Encounter: Payer: Self-pay | Admitting: Physical Therapy

## 2015-09-12 ENCOUNTER — Ambulatory Visit: Payer: Medicare Other | Admitting: Physical Therapy

## 2015-09-12 DIAGNOSIS — R262 Difficulty in walking, not elsewhere classified: Secondary | ICD-10-CM

## 2015-09-12 DIAGNOSIS — M6281 Muscle weakness (generalized): Secondary | ICD-10-CM

## 2015-09-12 NOTE — Therapy (Signed)
[place], [place], [address] Phone: [phone]   Fax:  [phone]  Physical Therapy Treatment  Patient Details  Name: Keith [name] MRN: [id number] Date of Birth: 1941-04-01 Referring Provider: Domenick Gong  Encounter Date: 09/12/2015      PT End of Session - 09/12/15 1413    Visit Number 6   Date for PT Re-Evaluation 10/21/15   PT Start Time 1324   PT Stop Time 1430   PT Time Calculation (min) 66 min   Activity Tolerance Patient tolerated treatment well   Behavior During Therapy [facility] for tasks assessed/performed      Past Medical History  Diagnosis Date  . Diabetes mellitus   . Hypertension   . Stroke ([place])   . Prostate hypertrophy   . Cirrhosis of liver ([place])   . Hepatic cancer ([employer])   . Varicose veins     Past Surgical History  Procedure Laterality Date  . Liver resection    . Tonsillectomy    . Eye surgery      bilateral cataract   . Appendectomy    . Left arm surgery      age 75  . Left hand surgery      tendons ripped on left hand  . Total knee arthroplasty Left 12/14/2012    Procedure: LEFT TOTAL KNEE ARTHROPLASTY;  Surgeon: Gearlean Alf, MD;  [facility];  Service: Orthopedics;  Laterality: Left;    There were no vitals filed for this visit.      Subjective Assessment - 09/12/15 1326    Subjective Reports that his legs feel really tight today, "having some trouble walking" c/o stiffness   Currently in Pain? Yes   Pain Score 6    Pain Location Leg   Pain Orientation Right;Left   Pain Descriptors / Indicators Aching;Tightness   Pain Type Neuropathic pain   Aggravating Factors  walking, sitting   Pain Relieving Factors massage                         [facility] Adult PT Treatment/Exercise - 09/12/15 0001    Knee/Hip Exercises: Stretches   Passive Hamstring Stretch 3 reps;20 seconds   Quad Stretch 3 reps;20 seconds   Hip Flexor  Stretch 3 reps;20 seconds   Gastroc Stretch 3 reps;20 seconds   Knee/Hip Exercises: Aerobic   Nustep level 4 x 6 minutes   Knee/Hip Exercises: Machines for Strengthening   Cybex Knee Extension 5# 2x15   Cybex Knee Flexion 25# 2x15   Cybex Leg Press 20# 3x10   Other Machine seated row 25#, lats 25#, chest press 5#   Moist Heat Therapy   Number Minutes Moist Heat 15 Minutes   Moist Heat Location Knee   Electrical Stimulation   Electrical Stimulation Location bilateral thighs   Electrical Stimulation Action premod   Electrical Stimulation Parameters supine   Electrical Stimulation Goals Pain   Manual Therapy   Myofascial Release use of roller on bilateral thighs and ITB's                  PT Short Term Goals - 09/01/15 0844    PT SHORT TERM GOAL #1   Title indepednent with an exercise program at home   Status Achieved           PT Long Term Goals - 09/08/15 0919    PT LONG TERM GOAL #1  Title decrease TUG time to 23 seconds   Status On-going   PT LONG TERM GOAL #3   Title report pain decreased 50% with sitting in recliner   Status On-going   PT LONG TERM GOAL #4   Title be independent with gym exercises   Status On-going               Plan - 09/12/15 1414    Clinical Impression Statement Reports that the STM helps or feels the best, he is able to do the exercises without much c/o pain, however he continues to hvae significant tightness and pain in the quad and the ITB   PT Next Visit Plan we will try to get him exercising again and safer wtih gait   Consulted and Agree with Plan of Care Patient      Patient will benefit from skilled therapeutic intervention in order to improve the following deficits and impairments:  Abnormal gait, Cardiopulmonary status limiting activity, Decreased activity tolerance, Decreased balance, Decreased mobility, Decreased range of motion, Decreased strength, Difficulty walking, Increased fascial restricitons, Increased  muscle spasms, Impaired flexibility, Improper body mechanics, Pain  Visit Diagnosis: Difficulty in walking, not elsewhere classified  Muscle weakness (generalized)     Problem List Patient Active Problem List   Diagnosis Date Noted  . Knee pain, acute 01/26/2013  . Constipation 12/22/2012  . Neuropathy ([place]) 12/22/2012  . Prostate hypertrophy 12/22/2012  . Acute blood loss anemia 12/22/2012  . Hyperlipidemia 12/22/2012  . Diabetes ([place]) 12/17/2012  . GERD (gastroesophageal reflux disease) 12/17/2012  . Hypertension 12/17/2012  . Thrombocytopenia, unspecified ([place]) 12/17/2012  . Postoperative anemia due to acute blood loss 12/15/2012  . Hyponatremia 12/15/2012  . OA (osteoarthritis) of knee 12/14/2012    [name], PT 09/12/2015, 2:16 PM  [place] [address] [place], [place], [address] Phone: [phone]   Fax:  [phone]  Name: Keith [name] MRN: [id number] Date of Birth: Jan 15, 1941

## 2015-09-14 ENCOUNTER — Ambulatory Visit: Payer: Medicare Other | Admitting: Physical Therapy

## 2015-09-14 ENCOUNTER — Encounter: Payer: Self-pay | Admitting: Physical Therapy

## 2015-09-14 DIAGNOSIS — R262 Difficulty in walking, not elsewhere classified: Secondary | ICD-10-CM | POA: Diagnosis not present

## 2015-09-14 DIAGNOSIS — M6281 Muscle weakness (generalized): Secondary | ICD-10-CM

## 2015-09-14 NOTE — Therapy (Signed)
[place], [place], [address] Phone: [phone]   Fax:  [phone]  Physical Therapy Treatment  Patient Details  Name: LINN GOETZE MRN: [id number] Date of Birth: 03-21-1941 Referring Provider: Domenick Gong  Encounter Date: 09/14/2015      PT End of Session - 09/14/15 1517    Visit Number 7   Date for PT Re-Evaluation 10/21/15   PT Start Time 1320   PT Stop Time 1420   PT Time Calculation (min) 60 min   Activity Tolerance Patient tolerated treatment well   Behavior During Therapy [facility] for tasks assessed/performed      Past Medical History  Diagnosis Date  . Diabetes mellitus   . Hypertension   . Stroke ([place])   . Prostate hypertrophy   . Cirrhosis of liver ([place])   . Hepatic cancer ([place])   . Varicose veins     Past Surgical History  Procedure Laterality Date  . Liver resection    . Tonsillectomy    . Eye surgery      bilateral cataract   . Appendectomy    . Left arm surgery      age 75  . Left hand surgery      tendons ripped on left hand  . Total knee arthroplasty Left 12/14/2012    Procedure: LEFT TOTAL KNEE ARTHROPLASTY;  Surgeon: Gearlean Alf, MD;  [facility]: [facility];  Service: Orthopedics;  Laterality: Left;    There were no vitals filed for this visit.      Subjective Assessment - 09/14/15 1329    Subjective right thigh felt better, left thigh just still really hurting   Currently in Pain? Yes   Pain Score 6    Pain Location Leg   Pain Orientation Right;Left   Pain Descriptors / Indicators Spasm;Tightness                         [facility] Adult PT Treatment/Exercise - 09/14/15 0001    Knee/Hip Exercises: Stretches   Passive Hamstring Stretch 3 reps;20 seconds   Quad Stretch 3 reps;20 seconds   Hip Flexor Stretch 3 reps;20 seconds   Gastroc Stretch 3 reps;20 seconds   Knee/Hip Exercises: Aerobic   Nustep level 4 x 6 minutes   Moist Heat  Therapy   Number Minutes Moist Heat 15 Minutes   Moist Heat Location Knee   Electrical Stimulation   Electrical Stimulation Location left thigh only   Electrical Stimulation Action IFC   Electrical Stimulation Parameters supine   Electrical Stimulation Goals Pain   Manual Therapy   Manual therapy comments really worked a little harder to get the ITB to relax, some precussion techniques   Myofascial Release use of roller on bilateral thighs and ITB's                  PT Short Term Goals - 09/01/15 0844    PT SHORT TERM GOAL #1   Title indepednent with an exercise program at home   Status Achieved           PT Long Term Goals - 09/14/15 1601    PT LONG TERM GOAL #2   Title report able to walk 300 feet without rest   Status On-going               Plan - 09/14/15 1554    Clinical Impression Statement Worked a little more on  STM to the left thigh today.  He is very tender and tight here.     PT Next Visit Plan see if focusing more on the STM of the left thigh helped   Consulted and Agree with Plan of Care Patient      Patient will benefit from skilled therapeutic intervention in order to improve the following deficits and impairments:  Abnormal gait, Cardiopulmonary status limiting activity, Decreased activity tolerance, Decreased balance, Decreased mobility, Decreased range of motion, Decreased strength, Difficulty walking, Increased fascial restricitons, Increased muscle spasms, Impaired flexibility, Improper body mechanics, Pain  Visit Diagnosis: Difficulty in walking, not elsewhere classified  Muscle weakness (generalized)     Problem List Patient Active Problem List   Diagnosis Date Noted  . Knee pain, acute 01/26/2013  . Constipation 12/22/2012  . Neuropathy ([place]) 12/22/2012  . Prostate hypertrophy 12/22/2012  . Acute blood loss anemia 12/22/2012  . Hyperlipidemia 12/22/2012  . Diabetes ([place]) 12/17/2012  . GERD (gastroesophageal reflux  disease) 12/17/2012  . Hypertension 12/17/2012  . Thrombocytopenia, unspecified ([place]) 12/17/2012  . Postoperative anemia due to acute blood loss 12/15/2012  . Hyponatremia 12/15/2012  . OA (osteoarthritis) of knee 12/14/2012    [name], PT 09/14/2015, 4:03 PM  [place], [place], [address] Phone: [phone]   Fax:  [phone]  Name: CAILEN MIHALIK MRN: [id number] Date of Birth: [birth year]

## 2015-09-19 ENCOUNTER — Encounter: Payer: Self-pay | Admitting: Physical Therapy

## 2015-09-19 ENCOUNTER — Ambulatory Visit: Payer: Medicare Other | Admitting: Physical Therapy

## 2015-09-19 DIAGNOSIS — R262 Difficulty in walking, not elsewhere classified: Secondary | ICD-10-CM

## 2015-09-19 DIAGNOSIS — M6281 Muscle weakness (generalized): Secondary | ICD-10-CM

## 2015-09-19 NOTE — Therapy (Signed)
[place], [place], [address] Phone: [phone]   Fax:  [phone]  Physical Therapy Treatment  Patient Details  Name: Keith [name] MRN: [id number] Date of Birth: 11-23-1940 Referring Provider: Domenick Gong  Encounter Date: 09/19/2015      PT End of Session - 09/19/15 1429    Visit Number 8   Date for PT Re-Evaluation 10/21/15   PT Start Time 1316   PT Stop Time 1409   PT Time Calculation (min) 53 min   Activity Tolerance Patient tolerated treatment well   Behavior During Therapy [facility] for tasks assessed/performed      Past Medical History  Diagnosis Date  . Diabetes mellitus   . Hypertension   . Stroke ([place])   . Prostate hypertrophy   . Cirrhosis of liver ([place])   . Hepatic cancer ([place])   . Varicose veins     Past Surgical History  Procedure Laterality Date  . Liver resection    . Tonsillectomy    . Eye surgery      bilateral cataract   . Appendectomy    . Left arm surgery      age 72  . Left hand surgery      tendons ripped on left hand  . Total knee arthroplasty Left 12/14/2012    Procedure: LEFT TOTAL KNEE ARTHROPLASTY;  Surgeon: Gearlean Alf, MD;  [facility];  Service: Orthopedics;  Laterality: Left;    There were no vitals filed for this visit.      Subjective Assessment - 09/19/15 1427    Subjective I thought you being tougher on the thigh helped quite a bit   Currently in Pain? Yes   Pain Score 5    Pain Location Leg   Pain Orientation Left   Pain Descriptors / Indicators Spasm;Tightness                         [facility] Adult PT Treatment/Exercise - 09/19/15 0001    Knee/Hip Exercises: Stretches   Passive Hamstring Stretch 3 reps;20 seconds   Quad Stretch 3 reps;20 seconds   Hip Flexor Stretch 3 reps;20 seconds   Gastroc Stretch 3 reps;20 seconds   Moist Heat Therapy   Number Minutes Moist Heat 15 Minutes   Moist Heat  Location Knee   Electrical Stimulation   Electrical Stimulation Location left thigh only   Electrical Stimulation Action IFC   Electrical Stimulation Parameters supine   Electrical Stimulation Goals Pain   Manual Therapy   Manual therapy comments really worked a little harder to get the ITB to relax, some precussion techniques, MFR of the left ITB   Myofascial Release use of roller on bilateral thighs and ITB's                  PT Short Term Goals - 09/01/15 0844    PT SHORT TERM GOAL #1   Title indepednent with an exercise program at home   Status Achieved           PT Long Term Goals - 09/14/15 1601    PT LONG TERM GOAL #2   Title report able to walk 300 feet without rest   Status On-going               Plan - 09/19/15 1429    Clinical Impression Statement I worked very hard on the left thigh and ITB today,  it does loosen up objectively feeling looser with palpation and subjectively with patient reporting "I can move it easier" as we treat the thigh with MFR.   PT Next Visit Plan see if focusing more on the STM of the left thigh helped   Consulted and Agree with Plan of Care Patient      Patient will benefit from skilled therapeutic intervention in order to improve the following deficits and impairments:  Abnormal gait, Cardiopulmonary status limiting activity, Decreased activity tolerance, Decreased balance, Decreased mobility, Decreased range of motion, Decreased strength, Difficulty walking, Increased fascial restricitons, Increased muscle spasms, Impaired flexibility, Improper body mechanics, Pain  Visit Diagnosis: Difficulty in walking, not elsewhere classified  Muscle weakness (generalized)     Problem List Patient Active Problem List   Diagnosis Date Noted  . Knee pain, acute 01/26/2013  . Constipation 12/22/2012  . Neuropathy ([place]) 12/22/2012  . Prostate hypertrophy 12/22/2012  . Acute blood loss anemia 12/22/2012  . Hyperlipidemia  12/22/2012  . Diabetes ([place]) 12/17/2012  . GERD (gastroesophageal reflux disease) 12/17/2012  . Hypertension 12/17/2012  . Thrombocytopenia, unspecified ([place]) 12/17/2012  . Postoperative anemia due to acute blood loss 12/15/2012  . Hyponatremia 12/15/2012  . OA (osteoarthritis) of knee 12/14/2012    [name], PT 09/19/2015, 2:31 PM  [place], [place], [address] Phone: [phone]   Fax:  [phone]  Name: Keith [name] MRN: [id number] Date of Birth: October 28, 1940

## 2015-09-21 ENCOUNTER — Ambulatory Visit: Payer: Medicare Other | Admitting: Physical Therapy

## 2015-09-26 ENCOUNTER — Encounter: Payer: Self-pay | Admitting: Physical Therapy

## 2015-09-26 ENCOUNTER — Ambulatory Visit: Payer: Medicare Other | Admitting: Physical Therapy

## 2015-09-26 DIAGNOSIS — S81812A Laceration without foreign body, left lower leg, initial encounter: Secondary | ICD-10-CM | POA: Diagnosis not present

## 2015-09-26 DIAGNOSIS — R262 Difficulty in walking, not elsewhere classified: Secondary | ICD-10-CM | POA: Diagnosis not present

## 2015-09-26 DIAGNOSIS — Z6841 Body Mass Index (BMI) 40.0 and over, adult: Secondary | ICD-10-CM | POA: Diagnosis not present

## 2015-09-26 DIAGNOSIS — M6281 Muscle weakness (generalized): Secondary | ICD-10-CM

## 2015-09-26 NOTE — Therapy (Signed)
[place], [place], [address] Phone: [phone]   Fax:  [phone]  Physical Therapy Treatment  Patient Details  Name: Keith [name] MRN: [id number] Date of Birth: 1940-09-05 Referring Provider: Domenick Gong  Encounter Date: 09/26/2015      PT End of Session - 09/26/15 1439    Visit Number 9   Date for PT Re-Evaluation 10/21/15   PT Start Time 0569   PT Stop Time 1450   PT Time Calculation (min) 54 min   Activity Tolerance Patient tolerated treatment well   Behavior During Therapy [facility] for tasks assessed/performed      Past Medical History  Diagnosis Date  . Diabetes mellitus   . Hypertension   . Stroke ([place])   . Prostate hypertrophy   . Cirrhosis of liver ([place])   . Hepatic cancer ([place])   . Varicose veins     Past Surgical History  Procedure Laterality Date  . Liver resection    . Tonsillectomy    . Eye surgery      bilateral cataract   . Appendectomy    . Left arm surgery      age 75  . Left hand surgery      tendons ripped on left hand  . Total knee arthroplasty Left 12/14/2012    Procedure: LEFT TOTAL KNEE ARTHROPLASTY;  Surgeon: Gearlean Alf, MD;  [facility];  Service: Orthopedics;  Laterality: Left;    There were no vitals filed for this visit.      Subjective Assessment - 09/26/15 1438    Subjective That last treatment really helped, the rougher the better, seemed to loosen me up until I was on my feet a lot today   Currently in Pain? Yes   Pain Score 5    Pain Location Leg   Pain Orientation Left;Lateral   Pain Descriptors / Indicators Tightness                         [facility] Adult PT Treatment/Exercise - 09/26/15 0001    Knee/Hip Exercises: Stretches   Passive Hamstring Stretch 3 reps;20 seconds   Quad Stretch 3 reps;20 seconds   Hip Flexor Stretch 3 reps;20 seconds   Gastroc Stretch 3 reps;20 seconds   Moist Heat Therapy    Number Minutes Moist Heat 15 Minutes   Moist Heat Location Knee   Electrical Stimulation   Electrical Stimulation Location left thigh only   Electrical Stimulation Action IFC   Electrical Stimulation Parameters supine   Electrical Stimulation Goals Pain   Manual Therapy   Manual therapy comments really worked a little harder to get the ITB to relax, some precussion techniques, MFR of the left ITB   Myofascial Release use of roller on bilateral thighs and ITB's                  PT Short Term Goals - 09/01/15 0844    PT SHORT TERM GOAL #1   Title indepednent with an exercise program at home   Status Achieved           PT Long Term Goals - 09/26/15 1442    PT LONG TERM GOAL #1   Title decrease TUG time to 23 seconds   Status On-going   PT LONG TERM GOAL #2   Title report able to walk 300 feet without rest   Status On-going  Plan - 10-22-2015 1440    Clinical Impression Statement Patient reports that he really feels that the rougher STM has helped, he has tremendous rigid ITB and TFL that is painful   PT Next Visit Plan see if focusing more on the STM of the left thigh helped   Consulted and Agree with Plan of Care Patient      Patient will benefit from skilled therapeutic intervention in order to improve the following deficits and impairments:  Abnormal gait, Cardiopulmonary status limiting activity, Decreased activity tolerance, Decreased balance, Decreased mobility, Decreased range of motion, Decreased strength, Difficulty walking, Increased fascial restricitons, Increased muscle spasms, Impaired flexibility, Improper body mechanics, Pain  Visit Diagnosis: Difficulty in walking, not elsewhere classified  Muscle weakness (generalized)       G-Codes - 10/22/2015 1442    Functional Assessment Tool Used foto 56% limitation   Functional Limitation Mobility: Walking and moving around   Mobility: Walking and Moving Around Current Status  [phone]) At least 40 percent but less than 60 percent impaired, limited or restricted   Mobility: Walking and Moving Around Goal Status [phone]) At least 40 percent but less than 60 percent impaired, limited or restricted      Problem List Patient Active Problem List   Diagnosis Date Noted  . Knee pain, acute 01/26/2013  . Constipation 12/22/2012  . Neuropathy ([place]) 12/22/2012  . Prostate hypertrophy 12/22/2012  . Acute blood loss anemia 12/22/2012  . Hyperlipidemia 12/22/2012  . Diabetes ([place]) 12/17/2012  . GERD (gastroesophageal reflux disease) 12/17/2012  . Hypertension 12/17/2012  . Thrombocytopenia, unspecified ([place]) 12/17/2012  . Postoperative anemia due to acute blood loss 12/15/2012  . Hyponatremia 12/15/2012  . OA (osteoarthritis) of knee 12/14/2012    [name], PT 10-22-15, 2:43 PM  [place], [place], [address] Phone: [phone]   Fax:  [phone]  Name: RAESHAUN SIMSON MRN: [id number] Date of Birth: 29-Nov-1940

## 2015-09-28 ENCOUNTER — Encounter: Payer: Self-pay | Admitting: Physical Therapy

## 2015-09-28 ENCOUNTER — Ambulatory Visit: Payer: Medicare Other | Admitting: Physical Therapy

## 2015-09-28 DIAGNOSIS — R262 Difficulty in walking, not elsewhere classified: Secondary | ICD-10-CM

## 2015-09-28 DIAGNOSIS — M6281 Muscle weakness (generalized): Secondary | ICD-10-CM

## 2015-09-28 NOTE — Therapy (Signed)
[place], [place], [address] Phone: [phone]   Fax:  [phone]  Physical Therapy Treatment  Patient Details  Name: Keith [name] MRN: [id number] Date of Birth: Nov 28, 1940 Referring Provider: Domenick Gong  Encounter Date: 09/28/2015      PT End of Session - 09/28/15 1343    Visit Number 10   Date for PT Re-Evaluation 10/21/15   PT Start Time 6295   PT Stop Time 1400   PT Time Calculation (min) 55 min   Activity Tolerance Patient tolerated treatment well   Behavior During Therapy [facility] for tasks assessed/performed      Past Medical History  Diagnosis Date  . Diabetes mellitus   . Hypertension   . Stroke ([place])   . Prostate hypertrophy   . Cirrhosis of liver ([place])   . Hepatic cancer ([place])   . Varicose veins     Past Surgical History  Procedure Laterality Date  . Liver resection    . Tonsillectomy    . Eye surgery      bilateral cataract   . Appendectomy    . Left arm surgery      age 75  . Left hand surgery      tendons ripped on left hand  . Total knee arthroplasty Left 12/14/2012    Procedure: LEFT TOTAL KNEE ARTHROPLASTY;  Surgeon: Gearlean Alf, MD;  [facility];  Service: Orthopedics;  Laterality: Left;    There were no vitals filed for this visit.      Subjective Assessment - 09/28/15 1339    Subjective Patient reports he feels better for about a day or more after treatment.  He reports that the more he is up and going the more tightness that comes, he was out at MD offices yesterday   Currently in Pain? Yes   Pain Score 3    Pain Location Leg   Pain Orientation Left;Lateral   Aggravating Factors  walking and standing   Pain Relieving Factors treatment            [facility] PT Assessment - 09/28/15 0001    AROM   Overall AROM Comments AROM of the knees 0-98 degrees bilaterally   Strength   Overall Strength Comments LE's 4/5                      [facility] Adult PT Treatment/Exercise - 09/28/15 0001    Knee/Hip Exercises: Stretches   Passive Hamstring Stretch 4 reps;20 seconds   Quad Stretch 4 reps;20 seconds   Hip Flexor Stretch 4 reps;20 seconds   Moist Heat Therapy   Number Minutes Moist Heat 15 Minutes   Moist Heat Location Knee   Electrical Stimulation   Electrical Stimulation Location left thigh only   Electrical Stimulation Action IFC   Electrical Stimulation Parameters supine   Electrical Stimulation Goals Pain   Manual Therapy   Manual therapy comments continue to work mostly on the left thigh, ITB, TFL areas with STM, precussion, MFR, and CFM   Myofascial Release use of roller on bilateral thighs and ITB's                  PT Short Term Goals - 09/01/15 0844    PT SHORT TERM GOAL #1   Title indepednent with an exercise program at home   Status Achieved           PT Long Term Goals -  09/26/15 1442    PT LONG TERM GOAL #1   Title decrease TUG time to 23 seconds   Status On-going   PT LONG TERM GOAL #2   Title report able to walk 300 feet without rest   Status On-going               Plan - 10/04/15 1344    Clinical Impression Statement The ITB and TFL are a little looser today, still rigid in comparison, reporting less pain today and better ability to walk   PT Next Visit Plan continue to work on the left thigh   Consulted and Agree with Plan of Care Patient      Patient will benefit from skilled therapeutic intervention in order to improve the following deficits and impairments:  Abnormal gait, Cardiopulmonary status limiting activity, Decreased activity tolerance, Decreased balance, Decreased mobility, Decreased range of motion, Decreased strength, Difficulty walking, Increased fascial restricitons, Increased muscle spasms, Impaired flexibility, Improper body mechanics, Pain  Visit Diagnosis: Difficulty in walking, not elsewhere classified  Muscle  weakness (generalized)       G-Codes - Oct 04, 2015 1345    Functional Assessment Tool Used foto 56% limitation   Functional Limitation Mobility: Walking and moving around   Mobility: Walking and Moving Around Current Status [phone]) At least 40 percent but less than 60 percent impaired, limited or restricted   Mobility: Walking and Moving Around Goal Status [phone]) At least 40 percent but less than 60 percent impaired, limited or restricted      Problem List Patient Active Problem List   Diagnosis Date Noted  . Knee pain, acute 01/26/2013  . Constipation 12/22/2012  . Neuropathy ([place]) 12/22/2012  . Prostate hypertrophy 12/22/2012  . Acute blood loss anemia 12/22/2012  . Hyperlipidemia 12/22/2012  . Diabetes ([place]) 12/17/2012  . GERD (gastroesophageal reflux disease) 12/17/2012  . Hypertension 12/17/2012  . Thrombocytopenia, unspecified ([place]) 12/17/2012  . Postoperative anemia due to acute blood loss 12/15/2012  . Hyponatremia 12/15/2012  . OA (osteoarthritis) of knee 12/14/2012    [name], PT 04-Oct-2015, 1:46 PM  [place], [place], [address] Phone: [phone]   Fax:  [phone]  Name: Keith [name] MRN: [id number] Date of Birth: Dec 03, 1940

## 2015-09-29 ENCOUNTER — Ambulatory Visit: Payer: Medicare Other | Admitting: Physical Therapy

## 2015-09-29 ENCOUNTER — Encounter: Payer: Self-pay | Admitting: Physical Therapy

## 2015-09-29 DIAGNOSIS — R262 Difficulty in walking, not elsewhere classified: Secondary | ICD-10-CM | POA: Diagnosis not present

## 2015-09-29 DIAGNOSIS — M6281 Muscle weakness (generalized): Secondary | ICD-10-CM

## 2015-09-29 NOTE — Therapy (Signed)
[place], [place], [address] Phone: [phone]   Fax:  [phone]  Physical Therapy Treatment  Patient Details  Name: Keith [name] MRN: [id number] Date of Birth: 1940-08-19 Referring Provider: Domenick Gong  Encounter Date: 09/29/2015      PT End of Session - 09/29/15 0923    Visit Number 11   Date for PT Re-Evaluation 10/21/15   PT Start Time 0848   PT Stop Time 0940   PT Time Calculation (min) 52 min   Activity Tolerance Patient tolerated treatment well   Behavior During Therapy [facility] for tasks assessed/performed      Past Medical History  Diagnosis Date  . Diabetes mellitus   . Hypertension   . Stroke ([place])   . Prostate hypertrophy   . Cirrhosis of liver ([place])   . Hepatic cancer ([place])   . Varicose veins     Past Surgical History  Procedure Laterality Date  . Liver resection    . Tonsillectomy    . Eye surgery      bilateral cataract   . Appendectomy    . Left arm surgery      age 75  . Left hand surgery      tendons ripped on left hand  . Total knee arthroplasty Left 12/14/2012    Procedure: LEFT TOTAL KNEE ARTHROPLASTY;  Surgeon: Gearlean Alf, MD;  [facility];  Service: Orthopedics;  Laterality: Left;    There were no vitals filed for this visit.      Subjective Assessment - 09/29/15 0921    Subjective Reports feeling pretty good from yesterday's treatment, reports some stress this AM did start the tightness again.   Currently in Pain? Yes   Pain Score 3    Pain Location Leg   Pain Orientation Left;Lateral                         [facility] Adult PT Treatment/Exercise - 09/29/15 0001    Knee/Hip Exercises: Stretches   Passive Hamstring Stretch 4 reps;20 seconds   Quad Stretch 4 reps;20 seconds   Hip Flexor Stretch 4 reps;20 seconds   Gastroc Stretch 3 reps;20 seconds   Moist Heat Therapy   Number Minutes Moist Heat 15 Minutes   Moist Heat Location Knee   Electrical Stimulation   Electrical Stimulation Location left thigh only   Electrical Stimulation Action IFC   Electrical Stimulation Parameters supine   Electrical Stimulation Goals Pain   Manual Therapy   Manual therapy comments continue to work mostly on the left thigh, ITB, TFL areas with STM, precussion, MFR, and CFM   Myofascial Release use of roller on bilateral thighs and ITB's                  PT Short Term Goals - 09/01/15 0844    PT SHORT TERM GOAL #1   Title indepednent with an exercise program at home   Status Achieved           PT Long Term Goals - 09/26/15 1442    PT LONG TERM GOAL #1   Title decrease TUG time to 23 seconds   Status On-going   PT LONG TERM GOAL #2   Title report able to walk 300 feet without rest   Status On-going               Plan - 09/29/15 3300  Clinical Impression Statement Continues to feel a little better and looser, still tighter than normal.  Walking after treatment is easier than when he gets here   PT Next Visit Plan continue to work on the left thigh may try to address his gait pattern   Consulted and Agree with Plan of Care Patient      Patient will benefit from skilled therapeutic intervention in order to improve the following deficits and impairments:  Abnormal gait, Cardiopulmonary status limiting activity, Decreased activity tolerance, Decreased balance, Decreased mobility, Decreased range of motion, Decreased strength, Difficulty walking, Increased fascial restricitons, Increased muscle spasms, Impaired flexibility, Improper body mechanics, Pain  Visit Diagnosis: Difficulty in walking, not elsewhere classified  Muscle weakness (generalized)       G-Codes - 2015/10/08 1345    Functional Assessment Tool Used foto 56% limitation   Functional Limitation Mobility: Walking and moving around   Mobility: Walking and Moving Around Current Status [phone]) At least 40 percent but less  than 60 percent impaired, limited or restricted   Mobility: Walking and Moving Around Goal Status [phone]) At least 40 percent but less than 60 percent impaired, limited or restricted      Problem List Patient Active Problem List   Diagnosis Date Noted  . Knee pain, acute 01/26/2013  . Constipation 12/22/2012  . Neuropathy ([place]) 12/22/2012  . Prostate hypertrophy 12/22/2012  . Acute blood loss anemia 12/22/2012  . Hyperlipidemia 12/22/2012  . Diabetes ([place]) 12/17/2012  . GERD (gastroesophageal reflux disease) 12/17/2012  . Hypertension 12/17/2012  . Thrombocytopenia, unspecified ([place]) 12/17/2012  . Postoperative anemia due to acute blood loss 12/15/2012  . Hyponatremia 12/15/2012  . OA (osteoarthritis) of knee 12/14/2012    [name], PT 09/29/2015, 9:27 AM  [place], [place], [address] Phone: [phone]   Fax:  [phone]  Name: Keith [name] MRN: [id number] Date of Birth: Jul 23, 1969

## 2015-10-03 ENCOUNTER — Ambulatory Visit: Payer: Medicare Other | Admitting: Physical Therapy

## 2015-10-04 ENCOUNTER — Ambulatory Visit: Payer: Medicare Other | Admitting: Physical Therapy

## 2015-10-05 ENCOUNTER — Ambulatory Visit: Payer: Medicare Other | Admitting: Physical Therapy

## 2015-10-17 ENCOUNTER — Encounter: Payer: Self-pay | Admitting: Physical Therapy

## 2015-10-17 ENCOUNTER — Ambulatory Visit: Payer: Medicare Other | Attending: Internal Medicine | Admitting: Physical Therapy

## 2015-10-17 DIAGNOSIS — M6281 Muscle weakness (generalized): Secondary | ICD-10-CM | POA: Insufficient documentation

## 2015-10-17 DIAGNOSIS — R262 Difficulty in walking, not elsewhere classified: Secondary | ICD-10-CM | POA: Diagnosis not present

## 2015-10-17 NOTE — Therapy (Signed)
[place], [place], [address] Phone: [phone]   Fax:  [phone]  Physical Therapy Treatment  Patient Details  Name: Keith [name] MRN: [id number] Date of Birth: 1940/09/18 Referring Provider: Domenick Gong  Encounter Date: 10/17/2015      PT End of Session - 10/17/15 1436    Visit Number 12   Date for PT Re-Evaluation 10/21/15   PT Start Time 1316   PT Stop Time 1404   PT Time Calculation (min) 48 min   Activity Tolerance Patient tolerated treatment well   Behavior During Therapy [facility] for tasks assessed/performed      Past Medical History  Diagnosis Date  . Diabetes mellitus   . Hypertension   . Stroke ([place])   . Prostate hypertrophy   . Cirrhosis of liver ([place])   . Hepatic cancer ([place])   . Varicose veins     Past Surgical History  Procedure Laterality Date  . Liver resection    . Tonsillectomy    . Eye surgery      bilateral cataract   . Appendectomy    . Left arm surgery      age 17  . Left hand surgery      tendons ripped on left hand  . Total knee arthroplasty Left 12/14/2012    Procedure: LEFT TOTAL KNEE ARTHROPLASTY;  Surgeon: Gearlean Alf, MD;  [facility];  Service: Orthopedics;  Laterality: Left;    There were no vitals filed for this visit.      Subjective Assessment - 10/17/15 1433    Subjective Patient reports that he was on vacation the past two weeks.  He reports that he could not do much on vacation because of his legs, he reports just being weak   Currently in Pain? Yes   Pain Score 5    Pain Location Leg   Pain Orientation Left;Lateral   Pain Descriptors / Indicators Tightness   Aggravating Factors  standing, stairs   Pain Relieving Factors treatment helps for a "little while"            [facility] PT Assessment - 10/17/15 0001    AROM   Overall AROM Comments AROM of the knees 0-101 degrees bilaterally   Ambulation/Gait   Gait  Comments not using can the past few weeks, does tend to creep along the walls and furniture, wide base of support   Standardized Balance Assessment   Standardized Balance Assessment Timed Up and Go Test   Timed Up and Go Test   Normal TUG (seconds) 26                     [facility] Adult PT Treatment/Exercise - 10/17/15 0001    Moist Heat Therapy   Number Minutes Moist Heat 15 Minutes   Manual Therapy   Manual therapy comments use of vibration to the bilateral ITB and quads   Myofascial Release use of roller on bilateral thighs and ITB's                  PT Short Term Goals - 09/01/15 0844    PT SHORT TERM GOAL #1   Title indepednent with an exercise program at home   Status Achieved           PT Long Term Goals - 10/17/15 1439    PT LONG TERM GOAL #1   Title decrease TUG time to 23 seconds  Status Partially Met   PT LONG TERM GOAL #2   Title report able to walk 300 feet without rest   Status Partially Met   PT LONG TERM GOAL #3   Title report pain decreased 50% with sitting in recliner   Status On-going   PT LONG TERM GOAL #4   Title be independent with gym exercises   Status On-going               Plan - 10/17/15 1437    Clinical Impression Statement Patient with c/o feeling very weak in the legs while on vacation, he stayed at a place with stairs and got to the point he was scared to go up and down.     PT Next Visit Plan tried vibration today on the tighs to see if this helps.  May return to exercises to see if this helps his function   Consulted and Agree with Plan of Care Patient      Patient will benefit from skilled therapeutic intervention in order to improve the following deficits and impairments:  Abnormal gait, Cardiopulmonary status limiting activity, Decreased activity tolerance, Decreased balance, Decreased mobility, Decreased range of motion, Decreased strength, Difficulty walking, Increased fascial restricitons, Increased  muscle spasms, Impaired flexibility, Improper body mechanics, Pain  Visit Diagnosis: Difficulty in walking, not elsewhere classified  Muscle weakness (generalized)     Problem List Patient Active Problem List   Diagnosis Date Noted  . Knee pain, acute 01/26/2013  . Constipation 12/22/2012  . Neuropathy ([place]) 12/22/2012  . Prostate hypertrophy 12/22/2012  . Acute blood loss anemia 12/22/2012  . Hyperlipidemia 12/22/2012  . Diabetes ([place]) 12/17/2012  . GERD (gastroesophageal reflux disease) 12/17/2012  . Hypertension 12/17/2012  . Thrombocytopenia, unspecified ([place]) 12/17/2012  . Postoperative anemia due to acute blood loss 12/15/2012  . Hyponatremia 12/15/2012  . OA (osteoarthritis) of knee 12/14/2012    [name], PT 10/17/2015, 2:45 PM  [place], [place], [address] Phone: [phone]   Fax:  [phone]  Name: Keith [name] MRN: [id number] Date of Birth: 11-08-1940

## 2015-10-20 ENCOUNTER — Ambulatory Visit: Payer: Medicare Other | Admitting: Physical Therapy

## 2015-10-20 ENCOUNTER — Encounter: Payer: Self-pay | Admitting: Physical Therapy

## 2015-10-20 DIAGNOSIS — R262 Difficulty in walking, not elsewhere classified: Secondary | ICD-10-CM

## 2015-10-20 DIAGNOSIS — M6281 Muscle weakness (generalized): Secondary | ICD-10-CM

## 2015-10-20 NOTE — Therapy (Signed)
[place], [place], [address] Phone: [phone]   Fax:  [phone]  Physical Therapy Treatment  Patient Details  Name: Keith [name] MRN: [id number] Date of Birth: 1940-06-03 Referring Provider: Domenick Gong  Encounter Date: 10/20/2015      PT End of Session - 10/20/15 1206    Visit Number 13   Date for PT Re-Evaluation 11/21/15   PT Start Time 1107   PT Stop Time 1205   PT Time Calculation (min) 58 min   Activity Tolerance Patient tolerated treatment well   Behavior During Therapy [facility] for tasks assessed/performed      Past Medical History  Diagnosis Date  . Diabetes mellitus   . Hypertension   . Stroke ([place])   . Prostate hypertrophy   . Cirrhosis of liver ([place])   . Hepatic cancer ([place])   . Varicose veins     Past Surgical History  Procedure Laterality Date  . Liver resection    . Tonsillectomy    . Eye surgery      bilateral cataract   . Appendectomy    . Left arm surgery      age 18  . Left hand surgery      tendons ripped on left hand  . Total knee arthroplasty Left 12/14/2012    Procedure: LEFT TOTAL KNEE ARTHROPLASTY;  Surgeon: Gearlean Alf, MD;  [facility];  Service: Orthopedics;  Laterality: Left;    There were no vitals filed for this visit.      Subjective Assessment - 10/20/15 1204    Subjective Patient reports that he felt a little better, "still feel week"   Currently in Pain? Yes   Pain Score 3    Pain Location Leg   Pain Orientation Left;Lateral   Pain Descriptors / Indicators Aching;Tightness                         [facility] Adult PT Treatment/Exercise - 10/20/15 0001    Knee/Hip Exercises: Aerobic   Nustep level 4 x 6 minutes   Knee/Hip Exercises: Machines for Strengthening   Cybex Knee Flexion 25# 2x15   Other Machine seated row 25#, lats 25#, chest press 5#   Moist Heat Therapy   Number Minutes Moist Heat 15  Minutes   Moist Heat Location Knee   Manual Therapy   Manual therapy comments use of vibration to the bilateral ITB and quads   Myofascial Release use of roller on bilateral thighs and ITB's                  PT Short Term Goals - 09/01/15 0844    PT SHORT TERM GOAL #1   Title indepednent with an exercise program at home   Status Achieved           PT Long Term Goals - 10/20/15 1212    PT LONG TERM GOAL #1   Title decrease TUG time to 23 seconds   Status Partially Met   PT LONG TERM GOAL #2   Title report able to walk 300 feet without rest   Status Partially Met   PT LONG TERM GOAL #3   Title report pain decreased 50% with sitting in recliner   Status Achieved   PT LONG TERM GOAL #4   Title be independent with gym exercises   Status Partially Met  Plan - 10/20/15 1211    Clinical Impression Statement Started exercises again today, he reports that he thinks the vibration may be helping.  He is learning the equipment so he can transition to independent in gym setting   PT Frequency 2x / week   PT Duration 4 weeks   PT Treatment/Interventions ADLs/Self Care Home Management;Electrical Stimulation;Moist Heat;Therapeutic exercise;Therapeutic activities;Functional mobility training;Gait training;Ultrasound;Balance training;Neuromuscular re-education;Patient/family education;Manual techniques   PT Next Visit Plan work on transition to independent gym and d/c   Consulted and Agree with Plan of Care Patient      Patient will benefit from skilled therapeutic intervention in order to improve the following deficits and impairments:  Abnormal gait, Cardiopulmonary status limiting activity, Decreased activity tolerance, Decreased balance, Decreased mobility, Decreased range of motion, Decreased strength, Difficulty walking, Increased fascial restricitons, Increased muscle spasms, Impaired flexibility, Improper body mechanics, Pain  Visit  Diagnosis: Difficulty in walking, not elsewhere classified - Plan: PT plan of care cert/re-cert  Muscle weakness (generalized) - Plan: PT plan of care cert/re-cert     Problem List Patient Active Problem List   Diagnosis Date Noted  . Knee pain, acute 01/26/2013  . Constipation 12/22/2012  . Neuropathy ([place]) 12/22/2012  . Prostate hypertrophy 12/22/2012  . Acute blood loss anemia 12/22/2012  . Hyperlipidemia 12/22/2012  . Diabetes ([place]) 12/17/2012  . GERD (gastroesophageal reflux disease) 12/17/2012  . Hypertension 12/17/2012  . Thrombocytopenia, unspecified ([place]) 12/17/2012  . Postoperative anemia due to acute blood loss 12/15/2012  . Hyponatremia 12/15/2012  . OA (osteoarthritis) of knee 12/14/2012    [name], PT 10/20/2015, 12:14 PM  [place] [address] [place], [place], [address] Phone: [phone]   Fax:  [phone]  Name: KARIS EMIG MRN: [id number] Date of Birth: 1940-05-10

## 2015-10-24 ENCOUNTER — Encounter: Payer: Self-pay | Admitting: Physical Therapy

## 2015-10-24 ENCOUNTER — Ambulatory Visit: Payer: Medicare Other | Admitting: Physical Therapy

## 2015-10-24 DIAGNOSIS — M6281 Muscle weakness (generalized): Secondary | ICD-10-CM

## 2015-10-24 DIAGNOSIS — R262 Difficulty in walking, not elsewhere classified: Secondary | ICD-10-CM

## 2015-10-24 NOTE — Therapy (Signed)
[place], [place], [address] Phone: [phone]   Fax:  [phone]  Physical Therapy Treatment  Patient Details  Name: Keith [name] MRN: [id number] Date of Birth: October 02, 1940 Referring Provider: Domenick Gong  Encounter Date: 10/24/2015      PT End of Session - 10/24/15 1355    Visit Number 14   Date for PT Re-Evaluation 11/21/15   PT Start Time 1315   PT Stop Time 1400   PT Time Calculation (min) 45 min   Activity Tolerance Patient tolerated treatment well   Behavior During Therapy [facility] for tasks assessed/performed      Past Medical History  Diagnosis Date  . Diabetes mellitus   . Hypertension   . Stroke ([place])   . Prostate hypertrophy   . Cirrhosis of liver ([place])   . Hepatic cancer ([place])   . Varicose veins     Past Surgical History  Procedure Laterality Date  . Liver resection    . Tonsillectomy    . Eye surgery      bilateral cataract   . Appendectomy    . Left arm surgery      age 8  . Left hand surgery      tendons ripped on left hand  . Total knee arthroplasty Left 12/14/2012    Procedure: LEFT TOTAL KNEE ARTHROPLASTY;  Surgeon: Gearlean Alf, MD;  [facility];  Service: Orthopedics;  Laterality: Left;    There were no vitals filed for this visit.      Subjective Assessment - 10/24/15 1354    Subjective My left thigh has been feeling better, I was also able to move it easier   Currently in Pain? Yes   Pain Score 2    Pain Location Leg   Pain Orientation Left;Lateral   Pain Descriptors / Indicators Aching;Tightness   Aggravating Factors  standing/walking   Pain Relieving Factors treatment                         [facility] Adult PT Treatment/Exercise - 10/24/15 0001    Knee/Hip Exercises: Aerobic   Nustep level 4 x 6 minutes   Knee/Hip Exercises: Machines for Strengthening   Cybex Knee Flexion 25# 2x15   Cybex Leg Press 20# 3x10   Other Machine seated row 25#, lats 25#, chest press 5#   Moist Heat Therapy   Number Minutes Moist Heat 15 Minutes   Moist Heat Location Knee   Manual Therapy   Manual therapy comments use of vibration to the bilateral ITB and quads   Myofascial Release use of roller on bilateral thighs and ITB's                  PT Short Term Goals - 09/01/15 0844    PT SHORT TERM GOAL #1   Title indepednent with an exercise program at home   Status Achieved           PT Long Term Goals - 10/20/15 1212    PT LONG TERM GOAL #1   Title decrease TUG time to 23 seconds   Status Partially Met   PT LONG TERM GOAL #2   Title report able to walk 300 feet without rest   Status Partially Met   PT LONG TERM GOAL #3   Title report pain decreased 50% with sitting in recliner   Status Achieved   PT LONG TERM GOAL #  4   Title be independent with gym exercises   Status Partially Met               Plan - 10/24/15 1444    Clinical Impression Statement Less tightness and tenderness in the thighs.  Very unsteady on feet today.  The neuropathy seems to have him walking with knees extended.   PT Next Visit Plan work on transition to independent gym and d/c   Consulted and Agree with Plan of Care Patient      Patient will benefit from skilled therapeutic intervention in order to improve the following deficits and impairments:  Abnormal gait, Cardiopulmonary status limiting activity, Decreased activity tolerance, Decreased balance, Decreased mobility, Decreased range of motion, Decreased strength, Difficulty walking, Increased fascial restricitons, Increased muscle spasms, Impaired flexibility, Improper body mechanics, Pain  Visit Diagnosis: Difficulty in walking, not elsewhere classified  Muscle weakness (generalized)     Problem List Patient Active Problem List   Diagnosis Date Noted  . Knee pain, acute 01/26/2013  . Constipation 12/22/2012  . Neuropathy ([place]) 12/22/2012  .  Prostate hypertrophy 12/22/2012  . Acute blood loss anemia 12/22/2012  . Hyperlipidemia 12/22/2012  . Diabetes ([place]) 12/17/2012  . GERD (gastroesophageal reflux disease) 12/17/2012  . Hypertension 12/17/2012  . Thrombocytopenia, unspecified ([place]) 12/17/2012  . Postoperative anemia due to acute blood loss 12/15/2012  . Hyponatremia 12/15/2012  . OA (osteoarthritis) of knee 12/14/2012    [name], PT 10/24/2015, 3:31 PM  [place] [address] [place], [place], [address] Phone: [phone]   Fax:  [phone]  Name: Keith [name] MRN: [id number] Date of Birth: 01-07-1941

## 2015-10-25 DIAGNOSIS — K7290 Hepatic failure, unspecified without coma: Secondary | ICD-10-CM | POA: Diagnosis not present

## 2015-10-25 DIAGNOSIS — I8500 Esophageal varices without bleeding: Secondary | ICD-10-CM | POA: Diagnosis not present

## 2015-10-25 DIAGNOSIS — K7460 Unspecified cirrhosis of liver: Secondary | ICD-10-CM | POA: Diagnosis not present

## 2015-10-25 DIAGNOSIS — R011 Cardiac murmur, unspecified: Secondary | ICD-10-CM | POA: Diagnosis not present

## 2015-10-25 DIAGNOSIS — K7581 Nonalcoholic steatohepatitis (NASH): Secondary | ICD-10-CM | POA: Diagnosis not present

## 2015-10-25 DIAGNOSIS — K766 Portal hypertension: Secondary | ICD-10-CM | POA: Diagnosis not present

## 2015-10-25 DIAGNOSIS — E119 Type 2 diabetes mellitus without complications: Secondary | ICD-10-CM | POA: Diagnosis not present

## 2015-10-25 DIAGNOSIS — R600 Localized edema: Secondary | ICD-10-CM | POA: Diagnosis not present

## 2015-10-25 DIAGNOSIS — R161 Splenomegaly, not elsewhere classified: Secondary | ICD-10-CM | POA: Diagnosis not present

## 2015-10-25 DIAGNOSIS — R001 Bradycardia, unspecified: Secondary | ICD-10-CM | POA: Diagnosis not present

## 2015-10-25 DIAGNOSIS — K7689 Other specified diseases of liver: Secondary | ICD-10-CM | POA: Diagnosis not present

## 2015-10-25 DIAGNOSIS — E1142 Type 2 diabetes mellitus with diabetic polyneuropathy: Secondary | ICD-10-CM | POA: Diagnosis not present

## 2015-10-25 DIAGNOSIS — C220 Liver cell carcinoma: Secondary | ICD-10-CM | POA: Diagnosis not present

## 2015-10-25 DIAGNOSIS — I868 Varicose veins of other specified sites: Secondary | ICD-10-CM | POA: Diagnosis not present

## 2015-10-31 ENCOUNTER — Ambulatory Visit: Payer: Medicare Other | Admitting: Physical Therapy

## 2015-10-31 ENCOUNTER — Encounter: Payer: Self-pay | Admitting: Physical Therapy

## 2015-10-31 DIAGNOSIS — R262 Difficulty in walking, not elsewhere classified: Secondary | ICD-10-CM

## 2015-10-31 DIAGNOSIS — M6281 Muscle weakness (generalized): Secondary | ICD-10-CM

## 2015-10-31 NOTE — Therapy (Signed)
[place], [place], [address] Phone: [phone]   Fax:  [phone]  Physical Therapy Treatment  Patient Details  Name: LESLEY GALENTINE MRN: [id number] Date of Birth: 23-Feb-1941 Referring Provider: Domenick Gong  Encounter Date: 10/31/2015      PT End of Session - 10/31/15 1354    Visit Number 15   Date for PT Re-Evaluation 11/21/15   PT Start Time 1314   PT Stop Time 1401   PT Time Calculation (min) 47 min   Activity Tolerance Patient tolerated treatment well   Behavior During Therapy [facility] for tasks assessed/performed      Past Medical History:  Diagnosis Date  . Cirrhosis of liver ([place])   . Diabetes mellitus   . Hepatic cancer ([place])   . Hypertension   . Prostate hypertrophy   . Stroke ([place])   . Varicose veins     Past Surgical History:  Procedure Laterality Date  . APPENDECTOMY    . EYE SURGERY     bilateral cataract   . left arm surgery     age 66  . left hand surgery     tendons ripped on left hand  . LIVER RESECTION    . TONSILLECTOMY    . TOTAL KNEE ARTHROPLASTY Left 12/14/2012   Procedure: LEFT TOTAL KNEE ARTHROPLASTY;  Surgeon: Gearlean Alf, MD;  [facility];  Service: Orthopedics;  Laterality: Left;    There were no vitals filed for this visit.      Subjective Assessment - 10/31/15 1352    Subjective My thighs are doing better my back is hurting some now.  I am not steady on my feet due to the neuropathy   Currently in Pain? Yes   Pain Score 4    Pain Location Back   Pain Orientation Lower;Mid   Aggravating Factors  unsure                         [facility] Adult PT Treatment/Exercise - 10/31/15 0001      Knee/Hip Exercises: Aerobic   Nustep level 4 x 6 minutes     Moist Heat Therapy   Number Minutes Moist Heat 15 Minutes   Moist Heat Location Lumbar Spine     Electrical Stimulation   Electrical Stimulation Location lumbar  spine   Electrical Stimulation Action IFC   Electrical Stimulation Parameters supine   Electrical Stimulation Goals Pain     Manual Therapy   Manual therapy comments use of vibration to the bilateral ITB and quads   Myofascial Release use of roller on bilateral thighs and ITB's                  PT Short Term Goals - 09/01/15 0844      PT SHORT TERM GOAL #1   Title indepednent with an exercise program at home   Status Achieved           PT Long Term Goals - 10/31/15 1355      PT LONG TERM GOAL #1   Title decrease TUG time to 23 seconds   Status Partially Met     PT LONG TERM GOAL #2   Title report able to walk 300 feet without rest   Status Partially Met     PT LONG TERM GOAL #3   Title report pain decreased 50% with sitting in recliner   Status Achieved  Plan - 10/31/15 1354    Clinical Impression Statement Reports less leg pain but today had an increase of low back pain, he also reports some difficulty walking due to the neuropathy of the LE's   PT Next Visit Plan work on transition to independent gym and d/c   Consulted and Agree with Plan of Care Patient      Patient will benefit from skilled therapeutic intervention in order to improve the following deficits and impairments:  Abnormal gait, Cardiopulmonary status limiting activity, Decreased activity tolerance, Decreased balance, Decreased mobility, Decreased range of motion, Decreased strength, Difficulty walking, Increased fascial restricitons, Increased muscle spasms, Impaired flexibility, Improper body mechanics, Pain  Visit Diagnosis: Difficulty in walking, not elsewhere classified  Muscle weakness (generalized)     Problem List Patient Active Problem List   Diagnosis Date Noted  . Knee pain, acute 01/26/2013  . Constipation 12/22/2012  . Neuropathy ([place]) 12/22/2012  . Prostate hypertrophy 12/22/2012  . Acute blood loss anemia 12/22/2012  . Hyperlipidemia 12/22/2012   . Diabetes ([place]) 12/17/2012  . GERD (gastroesophageal reflux disease) 12/17/2012  . Hypertension 12/17/2012  . Thrombocytopenia, unspecified ([place]) 12/17/2012  . Postoperative anemia due to acute blood loss 12/15/2012  . Hyponatremia 12/15/2012  . OA (osteoarthritis) of knee 12/14/2012    [name], PT 10/31/2015, 1:56 PM  [place] [address] [address] [place], [place], [address] Phone: [phone]   Fax:  [phone]  Name: JOHNROSS NABOZNY MRN: [id number] Date of Birth: 1941-03-02

## 2015-11-01 DIAGNOSIS — K7460 Unspecified cirrhosis of liver: Secondary | ICD-10-CM | POA: Diagnosis not present

## 2015-11-01 DIAGNOSIS — K769 Liver disease, unspecified: Secondary | ICD-10-CM | POA: Diagnosis not present

## 2015-11-01 DIAGNOSIS — K7581 Nonalcoholic steatohepatitis (NASH): Secondary | ICD-10-CM | POA: Diagnosis not present

## 2015-11-02 ENCOUNTER — Ambulatory Visit: Payer: Medicare Other | Admitting: Physical Therapy

## 2015-11-06 ENCOUNTER — Ambulatory Visit: Payer: Medicare Other | Admitting: Physical Therapy

## 2015-11-06 ENCOUNTER — Encounter: Payer: Self-pay | Admitting: Physical Therapy

## 2015-11-06 DIAGNOSIS — R262 Difficulty in walking, not elsewhere classified: Secondary | ICD-10-CM

## 2015-11-06 DIAGNOSIS — M6281 Muscle weakness (generalized): Secondary | ICD-10-CM

## 2015-11-06 NOTE — Therapy (Signed)
[place], [place], [address] Phone: [phone]   Fax:  [phone]  Physical Therapy Treatment  Patient Details  Name: Keith [name] MRN: [id number] Date of Birth: 04-May-1940 Referring Provider: Domenick Gong  Encounter Date: 11/06/2015      PT End of Session - 11/06/15 1146    Visit Number 16   Date for PT Re-Evaluation 11/21/15   PT Start Time 1103   PT Stop Time 1201   PT Time Calculation (min) 58 min   Activity Tolerance Patient tolerated treatment well   Behavior During Therapy [facility] for tasks assessed/performed      Past Medical History:  Diagnosis Date  . Cirrhosis of liver ([place])   . Diabetes mellitus   . Hepatic cancer ([place])   . Hypertension   . Prostate hypertrophy   . Stroke ([place])   . Varicose veins     Past Surgical History:  Procedure Laterality Date  . APPENDECTOMY    . EYE SURGERY     bilateral cataract   . left arm surgery     age 30  . left hand surgery     tendons ripped on left hand  . LIVER RESECTION    . TONSILLECTOMY    . TOTAL KNEE ARTHROPLASTY Left 12/14/2012   Procedure: LEFT TOTAL KNEE ARTHROPLASTY;  Surgeon: Gearlean Alf, MD;  [facility];  Service: Orthopedics;  Laterality: Left;    There were no vitals filed for this visit.      Subjective Assessment - 11/06/15 1107    Subjective My left thigh really started hurting last night, I was up a little more than normal.   Currently in Pain? Yes   Pain Score 6    Pain Location Leg   Pain Orientation Left;Lateral   Pain Descriptors / Indicators Aching;Tightness   Aggravating Factors  activity   Pain Relieving Factors the treatment helped                         [facility] Adult PT Treatment/Exercise - 11/06/15 0001      Knee/Hip Exercises: Stretches   Passive Hamstring Stretch 3 reps;30 seconds   Piriformis Stretch 30 seconds;2 reps     Knee/Hip Exercises: Aerobic   Nustep level 5 x 6 minutes     Knee/Hip Exercises: Machines for Strengthening   Cybex Knee Flexion 25# 2x15   Other Machine seated row 25#, lats 25#, chest press 5#     Knee/Hip Exercises: Seated   Ball Squeeze 20 reps     Knee/Hip Exercises: Supine   Other Supine Knee/Hip Exercises ankle red tband all motions x 15 reps some cues for inversion and eversion     Manual Therapy   Manual therapy comments use of vibration to the bilateral ITB and quads   Myofascial Release use of roller on bilateral thighs and ITB's                  PT Short Term Goals - 09/01/15 0844      PT SHORT TERM GOAL #1   Title indepednent with an exercise program at home   Status Achieved           PT Long Term Goals - 11/06/15 1149      PT LONG TERM GOAL #1   Title decrease TUG time to 23 seconds   Status Partially Met     PT LONG  TERM GOAL #2   Title report able to walk 300 feet without rest   Status Partially Met               Plan - 11/06/15 1147    Clinical Impression Statement Patient with some worries about his walking, the neuropathy in his legs really limits what he feels.  We discussed use of a cane, he is able to walk 200 feet with out rest now, at eval 120 feet was max distance with a rest.  The quads remain tight and painful mostly due to the compensation that goes on.   PT Next Visit Plan may work on his independetn ability to do ankle exercises and assess gait   Consulted and Agree with Plan of Care Patient      Patient will benefit from skilled therapeutic intervention in order to improve the following deficits and impairments:  Abnormal gait, Cardiopulmonary status limiting activity, Decreased activity tolerance, Decreased balance, Decreased mobility, Decreased range of motion, Decreased strength, Difficulty walking, Increased fascial restricitons, Increased muscle spasms, Impaired flexibility, Improper body mechanics, Pain  Visit Diagnosis: Difficulty in  walking, not elsewhere classified  Muscle weakness (generalized)     Problem List Patient Active Problem List   Diagnosis Date Noted  . Knee pain, acute 01/26/2013  . Constipation 12/22/2012  . Neuropathy ([place]) 12/22/2012  . Prostate hypertrophy 12/22/2012  . Acute blood loss anemia 12/22/2012  . Hyperlipidemia 12/22/2012  . Diabetes ([place]) 12/17/2012  . GERD (gastroesophageal reflux disease) 12/17/2012  . Hypertension 12/17/2012  . Thrombocytopenia, unspecified ([place]) 12/17/2012  . Postoperative anemia due to acute blood loss 12/15/2012  . Hyponatremia 12/15/2012  . OA (osteoarthritis) of knee 12/14/2012    [name], PT 11/06/2015, 11:50 AM  [place], [place], [address] Phone: [phone]   Fax:  [phone]  Name: Keith [name] MRN: [id number] Date of Birth: 28-Aug-1940

## 2015-11-13 ENCOUNTER — Ambulatory Visit: Payer: Medicare Other | Attending: Internal Medicine | Admitting: Physical Therapy

## 2015-11-13 ENCOUNTER — Encounter: Payer: Self-pay | Admitting: Physical Therapy

## 2015-11-13 DIAGNOSIS — M6281 Muscle weakness (generalized): Secondary | ICD-10-CM | POA: Diagnosis not present

## 2015-11-13 DIAGNOSIS — R262 Difficulty in walking, not elsewhere classified: Secondary | ICD-10-CM | POA: Insufficient documentation

## 2015-11-13 NOTE — Therapy (Signed)
[place], [place], [address] Phone: [phone]   Fax:  [phone]  Physical Therapy Treatment  Patient Details  Name: Keith [name] MRN: [id number] Date of Birth: 1940/06/16 Referring Provider: Domenick Gong  Encounter Date: 11/13/2015      PT End of Session - 11/13/15 1144    Visit Number 17   Date for PT Re-Evaluation 11/21/15   PT Start Time 1056   PT Stop Time 1152   PT Time Calculation (min) 56 min   Activity Tolerance Patient limited by lethargy   Behavior During Therapy [facility] for tasks assessed/performed      Past Medical History:  Diagnosis Date  . Cirrhosis of liver ([place])   . Diabetes mellitus   . Hepatic cancer ([place])   . Hypertension   . Prostate hypertrophy   . Stroke ([place])   . Varicose veins     Past Surgical History:  Procedure Laterality Date  . APPENDECTOMY    . EYE SURGERY     bilateral cataract   . left arm surgery     age 49  . left hand surgery     tendons ripped on left hand  . LIVER RESECTION    . TONSILLECTOMY    . TOTAL KNEE ARTHROPLASTY Left 12/14/2012   Procedure: LEFT TOTAL KNEE ARTHROPLASTY;  Surgeon: Gearlean Alf, MD;  Location: [facility];  Service: Orthopedics;  Laterality: Left;    There were no vitals filed for this visit.      Subjective Assessment - 11/13/15 1142    Subjective Patient expressing some concerns of the future, "will I be able to walk, I might need to go to a nursing home"  Worried about the progression of the neuropathy   Currently in Pain? Yes   Pain Score 5    Pain Location Leg   Pain Orientation Left;Lateral;Right                         [facility] Adult PT Treatment/Exercise - 11/13/15 0001      Knee/Hip Exercises: Aerobic   Nustep level 5 x 6 minutes     Knee/Hip Exercises: Machines for Strengthening   Cybex Knee Flexion 25# 2x15   Cybex Leg Press 20# 3x10   Other Machine seated row 25#, lats  25#, chest press 5#     Knee/Hip Exercises: Supine   Other Supine Knee/Hip Exercises ankle red tband all motions x 15 reps some cues for inversion and eversion     Manual Therapy   Manual therapy comments STM of the quads and ITB   Myofascial Release use of roller on bilateral thighs and ITB's                  PT Short Term Goals - 09/01/15 0844      PT SHORT TERM GOAL #1   Title indepednent with an exercise program at home   Status Achieved           PT Long Term Goals - 11/06/15 1149      PT LONG TERM GOAL #1   Title decrease TUG time to 23 seconds   Status Partially Met     PT LONG TERM GOAL #2   Title report able to walk 300 feet without rest   Status Partially Met               Plan - 11/13/15 1145  Clinical Impression Statement Patient with some increased verbalization about his future and his ability to walk, he tolerated increased exercise and we are working on him being able to do exercises on own   PT Next Visit Plan possible d/c, assure safety on machines   Consulted and Agree with Plan of Care Patient      Patient will benefit from skilled therapeutic intervention in order to improve the following deficits and impairments:     Visit Diagnosis: Difficulty in walking, not elsewhere classified  Muscle weakness (generalized)     Problem List Patient Active Problem List   Diagnosis Date Noted  . Knee pain, acute 01/26/2013  . Constipation 12/22/2012  . Neuropathy ([place]) 12/22/2012  . Prostate hypertrophy 12/22/2012  . Acute blood loss anemia 12/22/2012  . Hyperlipidemia 12/22/2012  . Diabetes ([place]) 12/17/2012  . GERD (gastroesophageal reflux disease) 12/17/2012  . Hypertension 12/17/2012  . Thrombocytopenia, unspecified ([place]) 12/17/2012  . Postoperative anemia due to acute blood loss 12/15/2012  . Hyponatremia 12/15/2012  . OA (osteoarthritis) of knee 12/14/2012    [name], PT 11/13/2015, 11:47 AM  [address] [place], [place], [address] Phone: [phone]   Fax:  [phone]  Name: Keith [name] MRN: [id number] Date of Birth: 1940-04-20

## 2015-11-16 ENCOUNTER — Ambulatory Visit: Payer: Medicare Other | Admitting: Physical Therapy

## 2015-11-16 ENCOUNTER — Encounter: Payer: Self-pay | Admitting: Physical Therapy

## 2015-11-16 DIAGNOSIS — R262 Difficulty in walking, not elsewhere classified: Secondary | ICD-10-CM | POA: Diagnosis not present

## 2015-11-16 DIAGNOSIS — M6281 Muscle weakness (generalized): Secondary | ICD-10-CM

## 2015-11-16 NOTE — Therapy (Signed)
Keith [name], [place], [address] Phone: [phone]   Fax:  [phone]  Physical Therapy Treatment  Patient Details  Name: Keith [name] MRN: [id number] Date of Birth: 1940/07/16 Referring Provider: Domenick Gong  Encounter Date: 11/16/2015      PT End of Session - 11/16/15 1426    Visit Number 18   Date for PT Re-Evaluation 11/21/15   PT Start Time 1311   PT Stop Time 1400   PT Time Calculation (min) 49 min   Activity Tolerance Patient tolerated treatment well   Behavior During Therapy [facility] for tasks assessed/performed      Past Medical History:  Diagnosis Date  . Cirrhosis of liver ([place])   . Diabetes mellitus   . Hepatic cancer ([place])   . Hypertension   . Prostate hypertrophy   . Stroke ([place])   . Varicose veins     Past Surgical History:  Procedure Laterality Date  . APPENDECTOMY    . EYE SURGERY     bilateral cataract   . left arm surgery     age 75  . left hand surgery     tendons ripped on left hand  . LIVER RESECTION    . TONSILLECTOMY    . TOTAL KNEE ARTHROPLASTY Left 12/14/2012   Procedure: LEFT TOTAL KNEE ARTHROPLASTY;  Surgeon: Gearlean Alf, MD;  [facility];  Service: Orthopedics;  Laterality: Left;    There were no vitals filed for this visit.      Subjective Assessment - 11/16/15 1424    Subjective I am feeling better, I am worried about trying this on my own.   Currently in Pain? Yes   Pain Score 2    Pain Location Leg   Pain Orientation Left;Right;Lateral   Pain Descriptors / Indicators Aching                         [facility] Adult PT Treatment/Exercise - 11/16/15 0001      Knee/Hip Exercises: Aerobic   Nustep level 5 x 8 minutes     Knee/Hip Exercises: Machines for Strengthening   Cybex Knee Flexion 25# 2x15   Cybex Leg Press 20# 3x10   Other Machine seated row 25#, lats 25#, chest press 5#     Manual Therapy   Manual  therapy comments STM of the quads and ITB   Myofascial Release use of roller on bilateral thighs and ITB's                  PT Short Term Goals - 09/01/15 0844      PT SHORT TERM GOAL #1   Title indepednent with an exercise program at home   Status Achieved           PT Long Term Goals - 11/16/15 1427      PT LONG TERM GOAL #1   Title decrease TUG time to 23 seconds   Status Partially Met     PT LONG TERM GOAL #2   Title report able to walk 300 feet without rest   Status Partially Met     PT LONG TERM GOAL #3   Title report pain decreased 50% with sitting in recliner   Status Achieved     PT LONG TERM GOAL #4   Title be independent with gym exercises   Status Achieved  Plan - December 05, 2015 1426    Clinical Impression Statement PT went over the patient the machines, how to adjust, what weight and reps how to be safe and what to be careful with.   PT Next Visit Plan D/C   Consulted and Agree with Plan of Care Patient      Patient will benefit from skilled therapeutic intervention in order to improve the following deficits and impairments:  Abnormal gait, Cardiopulmonary status limiting activity, Decreased activity tolerance, Decreased balance, Decreased mobility, Decreased range of motion, Decreased strength, Difficulty walking, Increased fascial restricitons, Increased muscle spasms, Impaired flexibility, Improper body mechanics, Pain  Visit Diagnosis: Difficulty in walking, not elsewhere classified  Muscle weakness (generalized)       G-Codes - Dec 05, 2015 1428    Functional Assessment Tool Used foto 45% limitation   Functional Limitation Mobility: Walking and moving around   Mobility: Walking and Moving Around Current Status [phone]) At least 40 percent but less than 60 percent impaired, limited or restricted   Mobility: Walking and Moving Around Goal Status [phone]) At least 40 percent but less than 60 percent impaired, limited or  restricted   Mobility: Walking and Moving Around Discharge Status [phone]) At least 40 percent but less than 60 percent impaired, limited or restricted      Problem List Patient Active Problem List   Diagnosis Date Noted  . Knee pain, acute 01/26/2013  . Constipation 12/22/2012  . Neuropathy ([place]) 12/22/2012  . Prostate hypertrophy 12/22/2012  . Acute blood loss anemia 12/22/2012  . Hyperlipidemia 12/22/2012  . Diabetes ([place]) 12/17/2012  . GERD (gastroesophageal reflux disease) 12/17/2012  . Hypertension 12/17/2012  . Thrombocytopenia, unspecified ([place]) 12/17/2012  . Postoperative anemia due to acute blood loss 12/15/2012  . Hyponatremia 12/15/2012  . OA (osteoarthritis) of knee 12/14/2012    [name], PT December 05, 2015, 2:33 PM  [place], [place], [address] Phone: [phone]   Fax:  [phone]  Name: Keith [name] MRN: [id number] Date of Birth: September 18, 1940

## 2015-11-23 ENCOUNTER — Telehealth: Payer: Self-pay

## 2015-11-23 NOTE — Telephone Encounter (Signed)
Add KX Modifier to next visit

## 2015-11-29 DIAGNOSIS — I350 Nonrheumatic aortic (valve) stenosis: Secondary | ICD-10-CM | POA: Diagnosis not present

## 2015-11-29 DIAGNOSIS — I440 Atrioventricular block, first degree: Secondary | ICD-10-CM | POA: Diagnosis not present

## 2015-11-29 DIAGNOSIS — K7460 Unspecified cirrhosis of liver: Secondary | ICD-10-CM | POA: Diagnosis not present

## 2015-11-29 DIAGNOSIS — I444 Left anterior fascicular block: Secondary | ICD-10-CM | POA: Diagnosis not present

## 2015-11-29 DIAGNOSIS — R011 Cardiac murmur, unspecified: Secondary | ICD-10-CM | POA: Diagnosis not present

## 2015-11-29 DIAGNOSIS — I498 Other specified cardiac arrhythmias: Secondary | ICD-10-CM | POA: Diagnosis not present

## 2015-11-29 DIAGNOSIS — C8298 Follicular lymphoma, unspecified, lymph nodes of multiple sites: Secondary | ICD-10-CM | POA: Diagnosis not present

## 2015-11-29 DIAGNOSIS — Z08 Encounter for follow-up examination after completed treatment for malignant neoplasm: Secondary | ICD-10-CM | POA: Diagnosis not present

## 2015-11-29 DIAGNOSIS — R001 Bradycardia, unspecified: Secondary | ICD-10-CM | POA: Diagnosis not present

## 2015-11-29 DIAGNOSIS — K7581 Nonalcoholic steatohepatitis (NASH): Secondary | ICD-10-CM | POA: Diagnosis not present

## 2015-11-29 DIAGNOSIS — I4589 Other specified conduction disorders: Secondary | ICD-10-CM | POA: Diagnosis not present

## 2015-11-29 DIAGNOSIS — Z8572 Personal history of non-Hodgkin lymphomas: Secondary | ICD-10-CM | POA: Diagnosis not present

## 2015-11-29 DIAGNOSIS — I4581 Long QT syndrome: Secondary | ICD-10-CM | POA: Diagnosis not present

## 2015-12-07 DIAGNOSIS — R001 Bradycardia, unspecified: Secondary | ICD-10-CM | POA: Diagnosis not present

## 2015-12-07 DIAGNOSIS — I440 Atrioventricular block, first degree: Secondary | ICD-10-CM | POA: Diagnosis not present

## 2015-12-08 DIAGNOSIS — R001 Bradycardia, unspecified: Secondary | ICD-10-CM | POA: Diagnosis not present

## 2015-12-12 ENCOUNTER — Other Ambulatory Visit: Payer: Self-pay | Admitting: *Deleted

## 2015-12-12 DIAGNOSIS — I83893 Varicose veins of bilateral lower extremities with other complications: Secondary | ICD-10-CM

## 2015-12-25 DIAGNOSIS — D7589 Other specified diseases of blood and blood-forming organs: Secondary | ICD-10-CM | POA: Diagnosis not present

## 2015-12-25 DIAGNOSIS — R160 Hepatomegaly, not elsewhere classified: Secondary | ICD-10-CM | POA: Diagnosis not present

## 2015-12-25 DIAGNOSIS — K7460 Unspecified cirrhosis of liver: Secondary | ICD-10-CM | POA: Diagnosis not present

## 2015-12-25 DIAGNOSIS — K7581 Nonalcoholic steatohepatitis (NASH): Secondary | ICD-10-CM | POA: Diagnosis not present

## 2015-12-25 DIAGNOSIS — K766 Portal hypertension: Secondary | ICD-10-CM | POA: Diagnosis not present

## 2016-01-29 DIAGNOSIS — Z125 Encounter for screening for malignant neoplasm of prostate: Secondary | ICD-10-CM | POA: Diagnosis not present

## 2016-01-29 DIAGNOSIS — I10 Essential (primary) hypertension: Secondary | ICD-10-CM | POA: Diagnosis not present

## 2016-01-29 DIAGNOSIS — E11319 Type 2 diabetes mellitus with unspecified diabetic retinopathy without macular edema: Secondary | ICD-10-CM | POA: Diagnosis not present

## 2016-01-29 DIAGNOSIS — E559 Vitamin D deficiency, unspecified: Secondary | ICD-10-CM | POA: Diagnosis not present

## 2016-01-31 DIAGNOSIS — Z79899 Other long term (current) drug therapy: Secondary | ICD-10-CM | POA: Diagnosis not present

## 2016-01-31 DIAGNOSIS — K7460 Unspecified cirrhosis of liver: Secondary | ICD-10-CM | POA: Diagnosis not present

## 2016-01-31 DIAGNOSIS — C220 Liver cell carcinoma: Secondary | ICD-10-CM | POA: Diagnosis not present

## 2016-01-31 DIAGNOSIS — K7581 Nonalcoholic steatohepatitis (NASH): Secondary | ICD-10-CM | POA: Diagnosis not present

## 2016-02-05 DIAGNOSIS — I8390 Asymptomatic varicose veins of unspecified lower extremity: Secondary | ICD-10-CM | POA: Diagnosis not present

## 2016-02-05 DIAGNOSIS — R946 Abnormal results of thyroid function studies: Secondary | ICD-10-CM | POA: Diagnosis not present

## 2016-02-05 DIAGNOSIS — Z794 Long term (current) use of insulin: Secondary | ICD-10-CM | POA: Diagnosis not present

## 2016-02-05 DIAGNOSIS — I358 Other nonrheumatic aortic valve disorders: Secondary | ICD-10-CM | POA: Diagnosis not present

## 2016-02-05 DIAGNOSIS — E559 Vitamin D deficiency, unspecified: Secondary | ICD-10-CM | POA: Diagnosis not present

## 2016-02-05 DIAGNOSIS — Z0000 Encounter for general adult medical examination without abnormal findings: Secondary | ICD-10-CM | POA: Diagnosis not present

## 2016-02-05 DIAGNOSIS — C8590 Non-Hodgkin lymphoma, unspecified, unspecified site: Secondary | ICD-10-CM | POA: Diagnosis not present

## 2016-02-05 DIAGNOSIS — C229 Malignant neoplasm of liver, not specified as primary or secondary: Secondary | ICD-10-CM | POA: Diagnosis not present

## 2016-02-05 DIAGNOSIS — N400 Enlarged prostate without lower urinary tract symptoms: Secondary | ICD-10-CM | POA: Diagnosis not present

## 2016-02-08 ENCOUNTER — Ambulatory Visit: Payer: Medicare Other | Attending: Internal Medicine | Admitting: Physical Therapy

## 2016-02-08 DIAGNOSIS — M25651 Stiffness of right hip, not elsewhere classified: Secondary | ICD-10-CM | POA: Diagnosis not present

## 2016-02-08 DIAGNOSIS — M545 Low back pain: Secondary | ICD-10-CM | POA: Insufficient documentation

## 2016-02-08 DIAGNOSIS — G8929 Other chronic pain: Secondary | ICD-10-CM | POA: Diagnosis not present

## 2016-02-08 DIAGNOSIS — M25652 Stiffness of left hip, not elsewhere classified: Secondary | ICD-10-CM | POA: Diagnosis not present

## 2016-02-08 DIAGNOSIS — M6281 Muscle weakness (generalized): Secondary | ICD-10-CM | POA: Diagnosis not present

## 2016-02-08 DIAGNOSIS — R262 Difficulty in walking, not elsewhere classified: Secondary | ICD-10-CM | POA: Diagnosis not present

## 2016-02-08 NOTE — Therapy (Signed)
[place], [place], [address] Phone: [phone]   Fax:  [phone]  Physical Therapy Evaluation  Patient Details  Name: Keith [name] MRN: [id number] Date of Birth: 06-14-1940 Referring Provider: Dr. Fransico Him Tisovec  Encounter Date: 02/08/2016      PT End of Session - 02/08/16 1112    Visit Number 1   Number of Visits --   Date for PT Re-Evaluation 04/04/16   PT Start Time 1021   PT Stop Time 1103   PT Time Calculation (min) 42 min   Activity Tolerance Patient tolerated treatment well   Behavior During Therapy [facility] for tasks assessed/performed      Past Medical History:  Diagnosis Date  . Cirrhosis of liver ([place])   . Diabetes mellitus   . Hepatic cancer ([place])   . Hypertension   . Prostate hypertrophy   . Stroke ([place])   . Varicose veins     Past Surgical History:  Procedure Laterality Date  . APPENDECTOMY    . EYE SURGERY     bilateral cataract   . left arm surgery     age 75  . left hand surgery     tendons ripped on left hand  . LIVER RESECTION    . TONSILLECTOMY    . TOTAL KNEE ARTHROPLASTY Left 12/14/2012   Procedure: LEFT TOTAL KNEE ARTHROPLASTY;  Surgeon: Gearlean Alf, MD;  [facility];  Service: Orthopedics;  Laterality: Left;    There were no vitals filed for this visit.       Subjective Assessment - 02/08/16 1022    Subjective Patient reporitng severe low back pain, feeling tight in the quads and intermittent tightness in posterior thigh. Having some trouble walking, feels like his legs are weak and the pain in his back doesnt allow him to walk. Feels like he is not sleeping well. Reporting neuropathy up to hip level   Pertinent History DM, neuropathy   Limitations Standing;Walking   How long can you sit comfortably? in recliner - chair lift "all day"   How long can you stand comfortably? 5 minutes   How long can you walk comfortably? short  distances - feels like his legs are giving out, leans against the wall or needs a chair.    Patient Stated Goals walk without pain   Currently in Pain? Yes   Pain Score 4    Pain Location Back   Pain Orientation Lower   Pain Descriptors / Indicators Sharp;Tingling   Pain Type Chronic pain;Neuropathic pain   Pain Onset More than a month ago   Pain Frequency Intermittent   Aggravating Factors  walking, standing, activity   Pain Relieving Factors sitting, resting            [facility] PT Assessment - 02/08/16 1031      Assessment   Medical Diagnosis low back pain   Referring Provider Dr. Fransico Him Tisovec   Onset Date/Surgical Date --  6 months ago   Next MD Visit prn   Prior Therapy for TKR and difficulty with gait     Precautions   Precautions None     Balance Screen   Has the patient fallen in the past 6 months No   Has the patient had a decrease in activity level because of a fear of falling?  Yes   Is the patient reluctant to leave their home because of a fear of falling?  No     Home Environment   Living Environment Private residence   Living Arrangements Spouse/significant other   Type of [place] to enter   Entrance Stairs-Number of Steps 2   Entrance Stairs-Rails Can reach both   [place] Two level;Able to live on main level with bedroom/bathroom   [place] - single point;Crutches;Grab bars - tub/shower     Prior Function   Level of Independence Independent with household mobility with device;Independent with community mobility with device   Vocation Retired   Leisure Oceanographer, puzzles     Cognition   Overall Cognitive Status Within Functional Limits for tasks assessed     Observation/Other Assessments   Focus on Therapeutic Outcomes (FOTO)  42 (58% limited, predicted 44% limited)     ROM / Strength   AROM / PROM / Strength AROM;PROM;Strength     AROM   Overall AROM Comments WNL B knee     PROM   Overall PROM  Comments Tight HS bilaterally, tight quad bilaterally  passive SLR: 30 degrees off table, glutes: bridge off table   PROM Assessment Site Hip;Knee;Ankle     Strength   Strength Assessment Site Knee;Hip;Ankle   Right/Left Hip Right;Left   Right Hip Flexion 4+/5   Right Hip Extension 3+/5   Left Hip Flexion 4+/5   Left Hip Extension 3+/5   Right/Left Knee Right;Left   Right Knee Flexion 5/5   Right Knee Extension 5/5   Left Knee Flexion 5/5   Left Knee Extension 5/5   Right/Left Ankle Right;Left   Right Ankle Dorsiflexion 4/5   Left Ankle Dorsiflexion 3+/5     Flexibility   Soft Tissue Assessment /Muscle Length yes   Hamstrings approx 30 deg B   Quadriceps modified Thomas with LE off side of table to approx 90 deg B      Palpation   Palpation comment no tenderness to palpation     Ambulation/Gait   Ambulation/Gait Yes   Ambulation/Gait Assistance 4: Min guard   Ambulation Distance (Feet) 50 Feet   Assistive device Straight cane   Gait Pattern Step-through pattern;Decreased step length - right;Decreased step length - left;Decreased stride length   Ambulation Surface Level;Indoor     Timed Up and Go Test   TUG Normal TUG   Normal TUG (seconds) 28                             PT Short Term Goals - 02/08/16 1255      PT SHORT TERM GOAL #1   Title patient to be independent with initial HEP (02/22/16)   Time 2   Period Weeks   Status New     PT SHORT TERM GOAL #2   Title Patient to improve TUG score to 24 seconds demonstrating improved functional gait (03/07/16)   Time 4   Period Weeks   Status New           PT Long Term Goals - 02/08/16 1259      PT LONG TERM GOAL #1   Title Patient to improve TUG score to 20 seconds (04/04/16)   Time 8   Period Weeks   Status New     PT LONG TERM GOAL #2   Title Patient to report ability to walk 350 feet without rest or increase in low back pain (04/04/16)   Time 8   Period Weeks  Status New      PT LONG TERM GOAL #3   Title Patient to demonstrate improved tissue quality as noted in improved B hamstring and quad tightness (04/04/16)   Time 8   Period Weeks   Status New               Plan - 02/08/16 1121    Clinical Impression Statement Patient is a 75 y/o M presenting to OPPT today with primary reports of chronic low back pain, B LE tightness nad difficulty walking. Patient with multiple comorbidities including neuropathy limiting overall activity levels. Patient today demonstrate reduced LE strength, B hamstring and quad tightness, and difficulty walking with noted overall instability with gait. Patient to benefit from skilled PT intervention to address the above listed deficits to improve patients function and mobility.    Rehab Potential Good   PT Frequency 2x / week   PT Duration 8 weeks   PT Treatment/Interventions ADLs/Self Care Home Management;Cryotherapy;Presenter, broadcasting;Therapeutic exercise;Therapeutic activities;Gait training;Stair training;Patient/family education;Manual techniques;Passive range of motion   PT Next Visit Plan progress hip strength and flexibility   Consulted and Agree with Plan of Care Patient      Patient will benefit from skilled therapeutic intervention in order to improve the following deficits and impairments:  Abnormal gait, Decreased activity tolerance, Decreased balance, Decreased range of motion, Decreased mobility, Decreased strength, Difficulty walking, Impaired flexibility, Pain, Increased fascial restricitons, Increased muscle spasms  Visit Diagnosis: Chronic low back pain, unspecified back pain laterality, with sciatica presence unspecified - Plan: PT plan of care cert/re-cert  Muscle weakness (generalized) - Plan: PT plan of care cert/re-cert  Stiffness of left hip, not elsewhere classified - Plan: PT plan of care cert/re-cert  Stiffness of right hip, not elsewhere classified - Plan:  PT plan of care cert/re-cert  Difficulty in walking, not elsewhere classified - Plan: PT plan of care cert/re-cert      G-Codes - 73/53/29 1119    Functional Assessment Tool Used foto 58% limited   Functional Limitation Mobility: Walking and moving around   Mobility: Walking and Moving Around Current Status [phone]) At least 40 percent but less than 60 percent impaired, limited or restricted   Mobility: Walking and Moving Around Goal Status [phone]) At least 40 percent but less than 60 percent impaired, limited or restricted       Problem List Patient Active Problem List   Diagnosis Date Noted  . Knee pain, acute 01/26/2013  . Constipation 12/22/2012  . Neuropathy ([place]) 12/22/2012  . Prostate hypertrophy 12/22/2012  . Acute blood loss anemia 12/22/2012  . Hyperlipidemia 12/22/2012  . Diabetes ([place]) 12/17/2012  . GERD (gastroesophageal reflux disease) 12/17/2012  . Hypertension 12/17/2012  . Thrombocytopenia, unspecified 12/17/2012  . Postoperative anemia due to acute blood loss 12/15/2012  . Hyponatremia 12/15/2012  . OA (osteoarthritis) of knee 12/14/2012        Lanney Gins, PT, DPT 02/08/16 1:06 PM     [place], [place], [address] Phone: [phone]   Fax:  [phone]  Name: GREYDON BETKE MRN: [id number] Date of Birth: 07/14/1940

## 2016-02-13 ENCOUNTER — Telehealth: Payer: Self-pay | Admitting: Physical Therapy

## 2016-02-13 NOTE — Telephone Encounter (Signed)
02/13/16 waiting for [facility] auth from referring MD, have called x 2.

## 2016-02-14 ENCOUNTER — Ambulatory Visit: Payer: Medicare Other | Admitting: Physical Therapy

## 2016-02-19 ENCOUNTER — Ambulatory Visit: Payer: Non-veteran care | Admitting: Vascular Surgery

## 2016-02-19 ENCOUNTER — Encounter (HOSPITAL_COMMUNITY): Payer: Non-veteran care

## 2016-02-21 ENCOUNTER — Encounter: Payer: Self-pay | Admitting: Vascular Surgery

## 2016-02-26 ENCOUNTER — Ambulatory Visit (HOSPITAL_COMMUNITY)
Admission: RE | Admit: 2016-02-26 | Discharge: 2016-02-26 | Disposition: A | Discharge status: Home / Self Care | Payer: Medicare Other | Source: Ambulatory Visit | Attending: Vascular Surgery | Admitting: Vascular Surgery

## 2016-02-26 DIAGNOSIS — K766 Portal hypertension: Secondary | ICD-10-CM | POA: Diagnosis not present

## 2016-02-26 DIAGNOSIS — K7581 Nonalcoholic steatohepatitis (NASH): Secondary | ICD-10-CM | POA: Diagnosis not present

## 2016-02-26 DIAGNOSIS — R161 Splenomegaly, not elsewhere classified: Secondary | ICD-10-CM | POA: Diagnosis not present

## 2016-02-26 DIAGNOSIS — R160 Hepatomegaly, not elsewhere classified: Secondary | ICD-10-CM | POA: Diagnosis not present

## 2016-02-26 DIAGNOSIS — K7689 Other specified diseases of liver: Secondary | ICD-10-CM | POA: Diagnosis not present

## 2016-02-26 DIAGNOSIS — K7469 Other cirrhosis of liver: Secondary | ICD-10-CM | POA: Diagnosis not present

## 2016-02-26 DIAGNOSIS — C220 Liver cell carcinoma: Secondary | ICD-10-CM | POA: Diagnosis not present

## 2016-02-26 DIAGNOSIS — R531 Weakness: Secondary | ICD-10-CM | POA: Diagnosis not present

## 2016-02-26 DIAGNOSIS — Z9221 Personal history of antineoplastic chemotherapy: Secondary | ICD-10-CM | POA: Diagnosis not present

## 2016-02-26 DIAGNOSIS — D3501 Benign neoplasm of right adrenal gland: Secondary | ICD-10-CM | POA: Diagnosis not present

## 2016-02-26 DIAGNOSIS — I83893 Varicose veins of bilateral lower extremities with other complications: Secondary | ICD-10-CM | POA: Insufficient documentation

## 2016-02-26 DIAGNOSIS — C8290 Follicular lymphoma, unspecified, unspecified site: Secondary | ICD-10-CM | POA: Diagnosis not present

## 2016-02-26 DIAGNOSIS — E1140 Type 2 diabetes mellitus with diabetic neuropathy, unspecified: Secondary | ICD-10-CM | POA: Diagnosis not present

## 2016-03-04 ENCOUNTER — Ambulatory Visit (INDEPENDENT_AMBULATORY_CARE_PROVIDER_SITE_OTHER): Payer: Medicare Other | Admitting: Vascular Surgery

## 2016-03-04 ENCOUNTER — Encounter: Payer: Self-pay | Admitting: Vascular Surgery

## 2016-03-04 VITALS — BP 117/72 | HR 65 | Temp 97.1°F | Resp 18 | Ht 70.0 in | Wt 323.0 lb

## 2016-03-04 DIAGNOSIS — I83892 Varicose veins of left lower extremities with other complications: Secondary | ICD-10-CM | POA: Insufficient documentation

## 2016-03-04 NOTE — Progress Notes (Signed)
Subjective:     Patient ID: Keith [name], male   DOB: 14-Sep-1940, 74 y.o.   MRN: [id number]  HPI This 75 year old male is known to me having previously undergone laser ablation of bilateral great saphenous veins in early of January 2016. He had a history of bilateral stasis ulcers. He has been having increasing pain in the left medial thigh and calf over the past several months after having these symptoms completely relieved early on. He has some milder symptoms in the right leg which are not bothering him. He has wearing long leg elastic compression stockings 20-30 millimeter gradient and trying elevation and ibuprofen with no improvement in the symptoms. He had documentation of closure of bilateral great saphenous veins after his previous procedures. This is affecting his daily living causing worsening discomfort and swelling in the left ankle. He has no history of interval DVT although he did develop an ulceration in the left pretibial region which was slow to heal.  Past Medical History:  Diagnosis Date  . Cirrhosis of liver ([place])   . Diabetes mellitus   . Hepatic cancer ([place])   . Hypertension   . Prostate hypertrophy   . Stroke ([place])   . Varicose veins     Social History  Substance Use Topics  . Smoking status: Former Research scientist (life sciences)  . Smokeless tobacco: Former Systems developer    Quit date: 11/20/1983  . Alcohol use No    Family History  Problem Relation Age of Onset  . Stroke Father     Allergies  Allergen Reactions  . Fentanyl Itching and Rash  . Iodine Rash  . Merbromin Rash  . Other Rash    OPIODS OPIODS     Current Outpatient Prescriptions:  .  albuterol (PROVENTIL HFA;VENTOLIN HFA) 108 (90 BASE) MCG/ACT inhaler, Inhale 1-2 puffs into the lungs every 6 (six) hours as needed for wheezing or shortness of breath., Disp: , Rfl:  .  cholecalciferol (VITAMIN D) 1000 UNITS tablet, Take 1,000 Units by mouth daily., Disp: , Rfl:  .  clopidogrel (PLAVIX) 75 MG tablet, Take 75 mg by  mouth daily., Disp: , Rfl:  .  diphenhydrAMINE (BENADRYL) 12.5 MG/5ML elixir, Take 5-10 mLs (12.5-25 mg total) by mouth every 4 (four) hours as needed for itching., Disp: 120 mL, Rfl: 0 .  finasteride (PROSCAR) 5 MG tablet, Take 5 mg by mouth daily.  , Disp: , Rfl:  .  folic acid (FOLVITE) 1 MG tablet, Take 1 mg by mouth daily., Disp: , Rfl:  .  furosemide (LASIX) 40 MG tablet, Take 40 mg by mouth every morning., Disp: , Rfl:  .  hydrocerin (EUCERIN) CREA, Apply 1 application topically daily as needed (dry skin). , Disp: , Rfl:  .  ibuprofen (ADVIL,MOTRIN) 200 MG tablet, Take 200 mg by mouth every 6 (six) hours as needed for mild pain or moderate pain., Disp: , Rfl:  .  insulin aspart (NOVOLOG) 100 UNIT/ML injection, Inject 2-10 Units into the skin 3 (three) times daily before meals. BS 150-200=2 units; 201-250=4 units; 251-300= 6 units; 301-350= 8 units; 350 and greater is 10 units, Disp: , Rfl:  .  insulin glargine (LANTUS) 100 UNIT/ML injection, Inject 50 Units into the skin every evening. , Disp: , Rfl:  .  polyvinyl alcohol (LIQUIFILM TEARS) 1.4 % ophthalmic solution, Place 1 drop into both eyes 2 (two) times daily as needed (dry eyes)., Disp: , Rfl:  .  pregabalin (LYRICA) 150 MG capsule, Take 150 mg by mouth 2 (two)  times daily., Disp: , Rfl:  .  simvastatin (ZOCOR) 20 MG tablet, Take 10 mg by mouth every evening., Disp: , Rfl:  .  spironolactone (ALDACTONE) 100 MG tablet, Take 100 mg by mouth every morning. , Disp: , Rfl:  .  tamsulosin (FLOMAX) 0.4 MG CAPS, Take 0.4 mg by mouth daily after supper. , Disp: , Rfl:  .  traMADol (ULTRAM) 50 MG tablet, Take 1-2 tablets (50-100 mg total) by mouth every 6 (six) hours as needed (mild pain). (Patient taking differently: Take 50 mg by mouth 2 (two) times daily. ), Disp: 60 tablet, Rfl: 0 .  bisacodyl (DULCOLAX) 10 MG suppository, Place 1 suppository (10 mg total) rectally daily as needed. (Patient not taking: Reported on 03/04/2016), Disp: 12  suppository, Rfl: 0 .  docusate sodium 100 MG CAPS, Take 100 mg by mouth 2 (two) times daily. (Patient not taking: Reported on 03/04/2016), Disp: 10 capsule, Rfl: 0 .  iron polysaccharides (NIFEREX) 150 MG capsule, Take 1 capsule (150 mg total) by mouth 2 (two) times daily. (Patient not taking: Reported on 03/04/2016), Disp: 42 capsule, Rfl: 0 .  loratadine (CLARITIN) 10 MG tablet, Take 10 mg by mouth daily as needed for allergies., Disp: , Rfl:  .  methocarbamol (ROBAXIN) 500 MG tablet, Take 1 tablet (500 mg total) by mouth every 6 (six) hours as needed. (Patient not taking: Reported on 03/04/2016), Disp: 80 tablet, Rfl: 0 .  metoCLOPramide (REGLAN) 5 MG tablet, Take 1-2 tablets (5-10 mg total) by mouth every 8 (eight) hours as needed (if ondansetron (ZOFRAN) ineffective.). (Patient not taking: Reported on 03/04/2016), Disp: 40 tablet, Rfl: 0 .  ondansetron (ZOFRAN) 4 MG tablet, Take 1 tablet (4 mg total) by mouth every 6 (six) hours as needed for nausea. (Patient not taking: Reported on 03/04/2016), Disp: 40 tablet, Rfl: 0 .  oxyCODONE (OXY IR/ROXICODONE) 5 MG immediate release tablet, Take 1-3 tablets (5-15 mg total) by mouth every 4 (four) hours as needed. (Patient not taking: Reported on 03/04/2016), Disp: 80 tablet, Rfl: 0 .  OxyCODONE (OXYCONTIN) 10 mg T12A 12 hr tablet, Take one tablet by mouth every 12 hours. Do not crush (Patient not taking: Reported on 03/04/2016), Disp: 60 tablet, Rfl: 0 .  pantoprazole (PROTONIX) 40 MG tablet, Take 40 mg by mouth daily., Disp: , Rfl:  .  polyethylene glycol (MIRALAX / GLYCOLAX) packet, Take 17 g by mouth daily as needed. (Patient not taking: Reported on 03/04/2016), Disp: 14 each, Rfl: 0 .  silver sulfADIAZINE (SILVADENE) 1 % cream, Apply topically daily. Apply to the right toe daily and cover with a dry dressing. (Patient not taking: Reported on 03/04/2016), Disp: 50 g, Rfl: 0 .  silver sulfADIAZINE (SILVADENE) 1 % cream, Apply topically daily. Apply to  right toe daily and cover with a dry dressing daily. (Patient not taking: Reported on 03/04/2016), Disp: 50 g, Rfl: 0 .  silver sulfADIAZINE (SILVADENE) 1 % cream, Apply topically daily. (Patient not taking: Reported on 03/04/2016), Disp: 50 g, Rfl: 0 .  warfarin (COUMADIN) 5 MG tablet, Take 1.5-2 tablets (7.5-10 mg total) by mouth daily. Take Coumadin for three weeks for postoperative protocol and then the patient may resume their previous Coumadin home regimen.  The dose may need to be adjusted based upon the INR.  Please follow the INR and titrate Coumadin dose for a therapeutic range between 2.0 and 3.0 INR.  After three weeks of Coumadin, the patient may resume their previous Coumadin home regimen and their Plavix. (Patient  not taking: Reported on 03/04/2016), Disp: 45 tablet, Rfl: 0  Vitals:   03/04/16 1342  BP: 117/72  Pulse: 65  Resp: 18  Temp: 97.1 F (36.2 C)  SpO2: 100%  Weight: (!) 323 lb (146.5 kg)  Height: 5' 10"  (1.778 m)    Body mass index is 46.35 kg/m.         Review of Systems Patient does have a small area in his chronically cirrhotic liver suspicious for cancer and will be having an ablation procedure December 12 of this year. Denies chest pain, dyspnea on exertion, hemoptysis. Is not on chronic anticoagulation    Objective:   Physical Exam BP 117/72 (BP Location: Left Arm, Patient Position: Sitting, Cuff Size: Large)   Pulse 65   Temp 97.1 F (36.2 C)   Resp 18   Ht 5' 10"  (1.778 m)   Wt (!) 323 lb (146.5 kg)   SpO2 100%   BMI 46.35 kg/m   Gen. morbidly obese male no apparent distress alert and oriented 3 Lungs no rhonchi or wheezing Abdomen obese Left leg with hypersensitivity medial distal thigh and 1+ chronic edema with hyperpigmentation lower third left leg with no active ulcer. Evidence of recently healed ulcer left pretibial region. 2+ dorsalis pedis pulse palpable. Right leg with chronic 1+ edema but no active ulcer.  Patient had formal  venous duplex exam performed in our office 02/26/2016 which reveals gross reflux in the large left great saphenous vein and no DVT. There is also some deep reflux at the popliteal level. Right great saphenous vein is absent from previous ablation  I confirmed the above findings with bedside SonoSite ultrasound exam today revealing a large caliber left great saphenous vein with reflux throughout     Assessment:     Gross reflux left great saphenous vein-recanalization-after successful laser ablation left great saphenous vein 2 years ago. This is causing pain and swelling which is not relieved by long leg elastic compression stockings 20-30 millimeter gradient, elevation, and ibuprofen Chronic cirrhosis of the liver with area suspicious for cancer to be treated with ablation 03/19/2016    Plan:     Patient needs laser ablation left great saphenous vein from calf to near saphenofemoral junction with increased energy from previous procedure Will proceed with precertification to perform this in the near future to hopefully prevent further stasis ulcers and relieve his pain. This can be performed following his liver ablation procedure in January 2018.

## 2016-03-11 DIAGNOSIS — Z794 Long term (current) use of insulin: Secondary | ICD-10-CM | POA: Diagnosis not present

## 2016-03-11 DIAGNOSIS — I10 Essential (primary) hypertension: Secondary | ICD-10-CM | POA: Diagnosis not present

## 2016-03-15 ENCOUNTER — Ambulatory Visit: Payer: Medicare Other | Attending: Internal Medicine | Admitting: Physical Therapy

## 2016-03-15 ENCOUNTER — Encounter: Payer: Self-pay | Admitting: Physical Therapy

## 2016-03-15 DIAGNOSIS — M25651 Stiffness of right hip, not elsewhere classified: Secondary | ICD-10-CM | POA: Diagnosis not present

## 2016-03-15 DIAGNOSIS — R262 Difficulty in walking, not elsewhere classified: Secondary | ICD-10-CM | POA: Diagnosis not present

## 2016-03-15 DIAGNOSIS — M6281 Muscle weakness (generalized): Secondary | ICD-10-CM | POA: Diagnosis not present

## 2016-03-15 DIAGNOSIS — M25652 Stiffness of left hip, not elsewhere classified: Secondary | ICD-10-CM

## 2016-03-15 DIAGNOSIS — G8929 Other chronic pain: Secondary | ICD-10-CM | POA: Diagnosis not present

## 2016-03-15 DIAGNOSIS — M545 Low back pain: Secondary | ICD-10-CM | POA: Insufficient documentation

## 2016-03-15 NOTE — Therapy (Signed)
[place], [place], [address] Phone: [phone]   Fax:  [phone]  Physical Therapy Treatment  Patient Details  Name: Keith [name] MRN: [id number] Date of Birth: 05-22-1940 Referring Provider: Dr. Fransico Him Tisovec  Encounter Date: 03/15/2016      PT End of Session - 03/15/16 1152    Visit Number 2   Date for PT Re-Evaluation 04/04/16   PT Start Time 1105   PT Stop Time 1200   PT Time Calculation (min) 55 min   Activity Tolerance Patient limited by fatigue;Patient tolerated treatment well   Behavior During Therapy [facility] for tasks assessed/performed      Past Medical History:  Diagnosis Date  . Cirrhosis of liver ([place])   . Diabetes mellitus   . Hepatic cancer ([place])   . Hypertension   . Prostate hypertrophy   . Stroke ([place])   . Varicose veins     Past Surgical History:  Procedure Laterality Date  . APPENDECTOMY    . EYE SURGERY     bilateral cataract   . left arm surgery     age 4  . left hand surgery     tendons ripped on left hand  . LIVER RESECTION    . TONSILLECTOMY    . TOTAL KNEE ARTHROPLASTY Left 12/14/2012   Procedure: LEFT TOTAL KNEE ARTHROPLASTY;  Surgeon: Gearlean Alf, MD;  Location: [facility];  Service: Orthopedics;  Laterality: Left;    There were no vitals filed for this visit.      Subjective Assessment - 03/15/16 1123    Subjective Pt reports doing "okay". He states that the neuropathy is still bad and he feels the pain in he legs and the low back.   Currently in Pain? Yes   Pain Score 3    Pain Location Leg   Pain Orientation Right;Left  quads   Multiple Pain Sites Yes   Pain Score 6   Pain Location Back   Pain Orientation Lower   Pain Type Chronic pain                         [facility] Adult PT Treatment/Exercise - 03/15/16 0001      Exercises   Exercises Lumbar;Knee/Hip;Shoulder     Knee/Hip Exercises: Aerobic   Stationary  Bike NuStep 10 minutes, lvl 4     Knee/Hip Exercises: Machines for Strengthening   Cybex Knee Flexion 15 lbs, 3x10     Shoulder Exercises: Seated   Row Strengthening;AROM;Both;20 reps;Theraband   Theraband Level (Shoulder Row) Level 3 (Green)   Other Seated Exercises extension pull back (elbows straight) with green tband  x20     Modalities   Modalities Moist Heat     Moist Heat Therapy   Number Minutes Moist Heat 10 Minutes   Moist Heat Location Lumbar Spine     Manual Therapy   Manual Therapy Passive ROM   Passive ROM HS, gastroc, SKC stretch, lumbar rotation                PT Education - 03/15/16 1152    Education provided No          PT Short Term Goals - 02/08/16 1255      PT SHORT TERM GOAL #1   Title patient to be independent with initial HEP (02/22/16)   Time 2   Period Weeks   Status New  PT SHORT TERM GOAL #2   Title Patient to improve TUG score to 24 seconds demonstrating improved functional gait (03/07/16)   Time 4   Period Weeks   Status New           PT Long Term Goals - 02/08/16 1259      PT LONG TERM GOAL #1   Title Patient to improve TUG score to 20 seconds (04/04/16)   Time 8   Period Weeks   Status New     PT LONG TERM GOAL #2   Title Patient to report ability to walk 350 feet without rest or increase in low back pain (04/04/16)   Time 8   Period Weeks   Status New     PT LONG TERM GOAL #3   Title Patient to demonstrate improved tissue quality as noted in improved B hamstring and quad tightness (04/04/16)   Time 8   Period Weeks   Status New               Plan - 03/15/16 1152    Clinical Impression Statement Pt tolerated treatment fair. He is very hypomobile due to the amount of comorbidities he has. Pt also experiences dizzy spells any time he transitions from supine to sit or sit to stand, which requires extended rest breaks and time for the dizziness to decrease. Pt is very receptive to exercises and  willing  to try. Progress per pt tolerance.   Rehab Potential Fair   PT Frequency 2x / week   PT Duration 8 weeks   PT Treatment/Interventions ADLs/Self Care Home Management;Cryotherapy;Presenter, broadcasting;Therapeutic exercise;Therapeutic activities;Gait training;Stair training;Patient/family education;Manual techniques;Passive range of motion   PT Next Visit Plan LE stretching, postural stability, LE strengthening, address pain   Consulted and Agree with Plan of Care Patient      Patient will benefit from skilled therapeutic intervention in order to improve the following deficits and impairments:  Abnormal gait, Decreased activity tolerance, Decreased balance, Decreased range of motion, Decreased mobility, Decreased strength, Difficulty walking, Impaired flexibility, Pain, Increased fascial restricitons, Increased muscle spasms  Visit Diagnosis: Chronic low back pain, unspecified back pain laterality, with sciatica presence unspecified  Muscle weakness (generalized)  Stiffness of left hip, not elsewhere classified  Stiffness of right hip, not elsewhere classified  Difficulty in walking, not elsewhere classified     Problem List Patient Active Problem List   Diagnosis Date Noted  . Varicose veins of left lower extremity with complications 77/41/2878  . Knee pain, acute 01/26/2013  . Constipation 12/22/2012  . Neuropathy ([place]) 12/22/2012  . Prostate hypertrophy 12/22/2012  . Acute blood loss anemia 12/22/2012  . Hyperlipidemia 12/22/2012  . Diabetes ([place]) 12/17/2012  . GERD (gastroesophageal reflux disease) 12/17/2012  . Hypertension 12/17/2012  . Thrombocytopenia, unspecified 12/17/2012  . Postoperative anemia due to acute blood loss 12/15/2012  . Hyponatremia 12/15/2012  . OA (osteoarthritis) of knee 12/14/2012    Toy Baker, SPT 03/15/2016, 11:56 AM  [place], [place], [address] Phone: [phone]   Fax:  [phone]  Name: Keith [name] MRN: [id number] Date of Birth: 1940-05-18

## 2016-03-19 ENCOUNTER — Other Ambulatory Visit: Payer: Self-pay | Admitting: *Deleted

## 2016-03-19 DIAGNOSIS — I83812 Varicose veins of left lower extremities with pain: Secondary | ICD-10-CM

## 2016-03-19 DIAGNOSIS — E119 Type 2 diabetes mellitus without complications: Secondary | ICD-10-CM | POA: Diagnosis not present

## 2016-03-19 DIAGNOSIS — Z6841 Body Mass Index (BMI) 40.0 and over, adult: Secondary | ICD-10-CM | POA: Diagnosis not present

## 2016-03-19 DIAGNOSIS — K219 Gastro-esophageal reflux disease without esophagitis: Secondary | ICD-10-CM | POA: Diagnosis not present

## 2016-03-19 DIAGNOSIS — I10 Essential (primary) hypertension: Secondary | ICD-10-CM | POA: Diagnosis not present

## 2016-03-19 DIAGNOSIS — C220 Liver cell carcinoma: Secondary | ICD-10-CM | POA: Diagnosis not present

## 2016-03-19 DIAGNOSIS — K7460 Unspecified cirrhosis of liver: Secondary | ICD-10-CM | POA: Diagnosis not present

## 2016-03-19 DIAGNOSIS — G4730 Sleep apnea, unspecified: Secondary | ICD-10-CM | POA: Diagnosis not present

## 2016-03-19 DIAGNOSIS — K7581 Nonalcoholic steatohepatitis (NASH): Secondary | ICD-10-CM | POA: Diagnosis not present

## 2016-03-19 DIAGNOSIS — Z794 Long term (current) use of insulin: Secondary | ICD-10-CM | POA: Diagnosis not present

## 2016-03-19 DIAGNOSIS — E7800 Pure hypercholesterolemia, unspecified: Secondary | ICD-10-CM | POA: Diagnosis not present

## 2016-03-19 DIAGNOSIS — D649 Anemia, unspecified: Secondary | ICD-10-CM | POA: Diagnosis not present

## 2016-03-21 ENCOUNTER — Telehealth: Payer: Self-pay | Admitting: Physical Therapy

## 2016-03-21 NOTE — Telephone Encounter (Signed)
03/21/16 patient called to say he wants to restart PT after first of year

## 2016-03-28 DIAGNOSIS — I4439 Other atrioventricular block: Secondary | ICD-10-CM | POA: Diagnosis not present

## 2016-03-28 DIAGNOSIS — I442 Atrioventricular block, complete: Secondary | ICD-10-CM | POA: Diagnosis not present

## 2016-03-28 DIAGNOSIS — R001 Bradycardia, unspecified: Secondary | ICD-10-CM | POA: Diagnosis not present

## 2016-03-28 DIAGNOSIS — I454 Nonspecific intraventricular block: Secondary | ICD-10-CM | POA: Diagnosis not present

## 2016-04-10 DIAGNOSIS — H43812 Vitreous degeneration, left eye: Secondary | ICD-10-CM | POA: Diagnosis not present

## 2016-04-10 DIAGNOSIS — H353131 Nonexudative age-related macular degeneration, bilateral, early dry stage: Secondary | ICD-10-CM | POA: Diagnosis not present

## 2016-04-10 DIAGNOSIS — G4733 Obstructive sleep apnea (adult) (pediatric): Secondary | ICD-10-CM | POA: Diagnosis not present

## 2016-04-10 DIAGNOSIS — E113593 Type 2 diabetes mellitus with proliferative diabetic retinopathy without macular edema, bilateral: Secondary | ICD-10-CM | POA: Diagnosis not present

## 2016-04-17 ENCOUNTER — Encounter: Payer: Self-pay | Admitting: Physical Therapy

## 2016-04-17 ENCOUNTER — Ambulatory Visit: Payer: Medicare Other | Attending: Internal Medicine | Admitting: Physical Therapy

## 2016-04-17 DIAGNOSIS — R262 Difficulty in walking, not elsewhere classified: Secondary | ICD-10-CM | POA: Insufficient documentation

## 2016-04-17 DIAGNOSIS — M6281 Muscle weakness (generalized): Secondary | ICD-10-CM | POA: Diagnosis not present

## 2016-04-17 DIAGNOSIS — M545 Low back pain: Secondary | ICD-10-CM | POA: Insufficient documentation

## 2016-04-17 DIAGNOSIS — G8929 Other chronic pain: Secondary | ICD-10-CM | POA: Diagnosis not present

## 2016-04-17 NOTE — Therapy (Signed)
[place], [place], [address] Phone: [phone]   Fax:  [phone]  Physical Therapy Treatment  Patient Details  Name: Keith [name] MRN: [id number] Date of Birth: May 06, 1940 Referring Provider: Dr. Fransico Him Tisovec  Encounter Date: 04/17/2016      PT End of Session - 04/17/16 1030    Visit Number 3   Date for PT Re-Evaluation 05/18/16   PT Start Time 0935   PT Stop Time 1025   PT Time Calculation (min) 50 min   Activity Tolerance Patient tolerated treatment well   Behavior During Therapy [facility] for tasks assessed/performed      Past Medical History:  Diagnosis Date  . Cirrhosis of liver ([place])   . Diabetes mellitus   . Hepatic cancer ([place])   . Hypertension   . Prostate hypertrophy   . Stroke ([place])   . Varicose veins     Past Surgical History:  Procedure Laterality Date  . APPENDECTOMY    . EYE SURGERY     bilateral cataract   . left arm surgery     age 66  . left hand surgery     tendons ripped on left hand  . LIVER RESECTION    . TONSILLECTOMY    . TOTAL KNEE ARTHROPLASTY Left 12/14/2012   Procedure: LEFT TOTAL KNEE ARTHROPLASTY;  Surgeon: Gearlean Alf, MD;  Location: [facility];  Service: Orthopedics;  Laterality: Left;    There were no vitals filed for this visit.      Subjective Assessment - 04/17/16 0937    Subjective Pt reports he is doing okay. Not much has changed since his last visit. He states he had a good Holiday season.   Currently in Pain? Yes   Pain Score 3    Pain Location Ankle   Pain Orientation Right   Multiple Pain Sites Yes   Pain Score 2   Pain Location Leg   Pain Orientation Right;Left                         [facility] Adult PT Treatment/Exercise - 04/17/16 0001      High Level Balance   High Level Balance Comments Static stand on airex pad 2x30 secs, standing marches 2x10 3lb ankle weights     Lumbar Exercises: Aerobic   Stationary Bike NuStep 10 min, lvl 4     Lumbar Exercises: Seated   Long Arc Quad on Chair Strengthening;Right;Left;2 sets;10 reps;Weights   LAQ on Chair Weights (lbs) 3     Shoulder Exercises: ROM/Strengthening   Cybex Press 10 reps;Other (comment)  20 lb, 2 sets   Other ROM/Strengthening Exercises cybex shoulder punches 10 lb each arm     Manual Therapy   Manual Therapy Other (comment)   Other Manual Therapy Therapeutic massage (vibration)                PT Education - 04/17/16 1030    Education provided No          PT Short Term Goals - 04/17/16 1036      PT SHORT TERM GOAL #1   Status Achieved           PT Long Term Goals - 04/17/16 1035      PT LONG TERM GOAL #1   Status On-going     PT LONG TERM GOAL #2   Status On-going     PT LONG  TERM GOAL #3   Status On-going     PT LONG TERM GOAL #4   Status On-going               Plan - 04/17/16 1032    Clinical Impression Statement Pt tolerated treatment well and was able to complete all exercises. Pt needs contact guard assist with static standing balance exercise and min assist with standing marches. Pt reports that the neuropathy limits his balance. When asked about AD use, pt states that he prefers not to use an AD because he would like to correct his balance.   Rehab Potential Fair   PT Frequency 2x / week   PT Duration 8 weeks   PT Treatment/Interventions ADLs/Self Care Home Management;Cryotherapy;Presenter, broadcasting;Therapeutic exercise;Therapeutic activities;Gait training;Stair training;Patient/family education;Manual techniques;Passive range of motion   PT Next Visit Plan LE stretching, postural stability, LE strengthening, balance activity, address pain   Consulted and Agree with Plan of Care Patient      Patient will benefit from skilled therapeutic intervention in order to improve the following deficits and impairments:  Abnormal gait,  Decreased activity tolerance, Decreased balance, Decreased range of motion, Decreased mobility, Decreased strength, Difficulty walking, Impaired flexibility, Pain, Increased fascial restricitons, Increased muscle spasms  Visit Diagnosis: Muscle weakness (generalized)  Difficulty in walking, not elsewhere classified  Chronic low back pain, unspecified back pain laterality, with sciatica presence unspecified     Problem List Patient Active Problem List   Diagnosis Date Noted  . Varicose veins of left lower extremity with complications 76/15/1834  . Knee pain, acute 01/26/2013  . Constipation 12/22/2012  . Neuropathy ([place]) 12/22/2012  . Prostate hypertrophy 12/22/2012  . Acute blood loss anemia 12/22/2012  . Hyperlipidemia 12/22/2012  . Diabetes ([place]) 12/17/2012  . GERD (gastroesophageal reflux disease) 12/17/2012  . Hypertension 12/17/2012  . Thrombocytopenia, unspecified 12/17/2012  . Postoperative anemia due to acute blood loss 12/15/2012  . Hyponatremia 12/15/2012  . OA (osteoarthritis) of knee 12/14/2012    Toy Baker, SPT 04/17/2016, 10:37 AM  [place], [place], [address] Phone: [phone]   Fax:  [phone]  Name: Keith [name] MRN: [id number] Date of Birth: 11-01-1940

## 2016-04-23 ENCOUNTER — Encounter: Payer: Self-pay | Admitting: Physical Therapy

## 2016-04-23 ENCOUNTER — Ambulatory Visit: Payer: Medicare Other | Admitting: Physical Therapy

## 2016-04-23 DIAGNOSIS — M6281 Muscle weakness (generalized): Secondary | ICD-10-CM

## 2016-04-23 DIAGNOSIS — R262 Difficulty in walking, not elsewhere classified: Secondary | ICD-10-CM

## 2016-04-23 NOTE — Therapy (Signed)
[place], [place], [address] Phone: [phone]   Fax:  [phone]  Physical Therapy Treatment  Patient Details  Name: Keith [name] MRN: [id number] Date of Birth: 12-28-1940 Referring Provider: Dr. Fransico Him Tisovec  Encounter Date: 04/23/2016      PT End of Session - 04/23/16 1512    Visit Number 4   Date for PT Re-Evaluation 05/18/16   PT Start Time 1400   PT Stop Time 1446   PT Time Calculation (min) 46 min   Activity Tolerance Patient tolerated treatment well   Behavior During Therapy [facility] for tasks assessed/performed      Past Medical History:  Diagnosis Date  . Cirrhosis of liver ([place])   . Diabetes mellitus   . Hepatic cancer ([place])   . Hypertension   . Prostate hypertrophy   . Stroke ([place])   . Varicose veins     Past Surgical History:  Procedure Laterality Date  . APPENDECTOMY    . EYE SURGERY     bilateral cataract   . left arm surgery     age 76  . left hand surgery     tendons ripped on left hand  . LIVER RESECTION    . TONSILLECTOMY    . TOTAL KNEE ARTHROPLASTY Left 12/14/2012   Procedure: LEFT TOTAL KNEE ARTHROPLASTY;  Surgeon: Gearlean Alf, MD;  Location: [facility];  Service: Orthopedics;  Laterality: Left;    There were no vitals filed for this visit.      Subjective Assessment - 04/23/16 1410    Subjective Reports that his legs were sore after the last treatment   Currently in Pain? Yes   Pain Score 4    Pain Location Leg   Pain Orientation Right;Left;Upper   Pain Descriptors / Indicators Aching;Tightness;Spasm                         [facility] Adult PT Treatment/Exercise - 04/23/16 0001      Lumbar Exercises: Aerobic   Stationary Bike NuStep 6 min, lvl 4   UBE (Upper Arm Bike) level 5 x 4 minutes     Lumbar Exercises: Machines for Strengthening   Other Lumbar Machine Exercise chest press 10# 3x10   Other Lumbar Machine Exercise  blue tband trunk obliques     Knee/Hip Exercises: Standing   Hip Flexion Both;2 sets;10 reps   Hip Flexion Limitations 3#   Hip Abduction Both;2 sets;10 reps   Abduction Limitations 3#   Other Standing Knee Exercises hamstring curls 3#   Other Standing Knee Exercises weight shifts while standing     Knee/Hip Exercises: Seated   Long Arc Quad Both;2 sets;10 reps   Long Arc Quad Weight 3 lbs.     Manual Therapy   Other Manual Therapy Therapeutic massage (vibration) to bilateral thighs and ITB areas                  PT Short Term Goals - 04/17/16 1036      PT SHORT TERM GOAL #1   Status Achieved           PT Long Term Goals - 04/17/16 1035      PT LONG TERM GOAL #1   Status On-going     PT LONG TERM GOAL #2   Status On-going     PT LONG TERM GOAL #3   Status On-going  PT LONG TERM GOAL #4   Status On-going               Plan - 04/23/16 1513    Clinical Impression Statement Patient has some difficulty with standing and going from supine to sit due to dizziness.  He has very tight quads and ITB's.  He has significant neuropathy and has difficulty with balance and has to modify how he walks   PT Next Visit Plan LE stretching, postural stability, LE strengthening, balance activity, address pain   Consulted and Agree with Plan of Care Patient      Patient will benefit from skilled therapeutic intervention in order to improve the following deficits and impairments:  Abnormal gait, Decreased activity tolerance, Decreased balance, Decreased range of motion, Decreased mobility, Decreased strength, Difficulty walking, Impaired flexibility, Pain, Increased fascial restricitons, Increased muscle spasms  Visit Diagnosis: Muscle weakness (generalized)  Difficulty in walking, not elsewhere classified     Problem List Patient Active Problem List   Diagnosis Date Noted  . Varicose veins of left lower extremity with complications 78/29/5621  . Knee  pain, acute 01/26/2013  . Constipation 12/22/2012  . Neuropathy ([place]) 12/22/2012  . Prostate hypertrophy 12/22/2012  . Acute blood loss anemia 12/22/2012  . Hyperlipidemia 12/22/2012  . Diabetes ([place]) 12/17/2012  . GERD (gastroesophageal reflux disease) 12/17/2012  . Hypertension 12/17/2012  . Thrombocytopenia, unspecified 12/17/2012  . Postoperative anemia due to acute blood loss 12/15/2012  . Hyponatremia 12/15/2012  . OA (osteoarthritis) of knee 12/14/2012    [name], PT 04/23/2016, 3:19 PM  [address] [place], [place], [address] Phone: [phone]   Fax:  [phone]  Name: BELTON PEPLINSKI MRN: [id number] Date of Birth: 12-13-1940

## 2016-04-30 ENCOUNTER — Ambulatory Visit: Payer: Medicare Other | Admitting: Physical Therapy

## 2016-05-06 ENCOUNTER — Encounter: Payer: Self-pay | Admitting: Vascular Surgery

## 2016-05-13 ENCOUNTER — Ambulatory Visit (INDEPENDENT_AMBULATORY_CARE_PROVIDER_SITE_OTHER): Payer: Medicare Other | Admitting: Vascular Surgery

## 2016-05-13 ENCOUNTER — Encounter: Payer: Self-pay | Admitting: Vascular Surgery

## 2016-05-13 VITALS — BP 132/80 | HR 79 | Temp 98.0°F | Resp 18 | Ht 70.0 in | Wt 319.5 lb

## 2016-05-13 DIAGNOSIS — I83892 Varicose veins of left lower extremities with other complications: Secondary | ICD-10-CM | POA: Diagnosis not present

## 2016-05-13 NOTE — Progress Notes (Signed)
Subjective:     Patient ID: Keith [name], male   DOB: 24-Jun-1940, 76 y.o.   MRN: [id number]  HPI This 76 year old male had laser ablation left great saphenous vein from proximal calf to near the saphenofemoral junction performed under local tumescent anesthesia. A total of 3057 J of energy was utilized. This procedure was performed at slight increased power-17 J rather than 15. This was a recurrent patency of the left great saphenous vein having been done once previously in the past. He tolerated the procedure well.  Review of Systems     Objective:   Physical Exam BP 132/80   Pulse 79   Temp 98 F (36.7 C)   Resp 18   Ht 5' 10"  (1.778 m)   Wt (!) 319 lb 8 oz (144.9 kg)   SpO2 98%   BMI 45.84 kg/m       Assessment:     Well-tolerated laser ablation left great saphenous vein performed under local tumescent anesthesia    Plan:     Return in 1 week for venous duplex exam to confirm closure left great saphenous vein and this will complete patient's treatment regimen with no further follow-up needed

## 2016-05-13 NOTE — Progress Notes (Signed)
Laser Ablation Procedure    Date: 05/13/2016   Keith [name] DOB:[birth year]  Consent signed: Yes    Surgeon:  Dr. Nelda Severe. Kellie Simmering  Procedure: Laser Ablation: left Greater Saphenous Vein  BP 132/80   Pulse 79   Temp 98 F (36.7 C)   Resp 18   Ht 5' 10"  (1.778 m)   Wt (!) 319 lb 8 oz (144.9 kg)   SpO2 98%   BMI 45.84 kg/m   Tumescent Anesthesia: 485 cc 0.9% NaCl with 50 cc Lidocaine HCL with 1% Epi and 15 cc 8.4% NaHCO3  Local Anesthesia: 5 cc Lidocaine HCL and NaHCO3 (ratio 2:1)  Pulsed Mode: 17 watts, 584m delay, 1.0 duration  Total Energy: 3057             Total Pulses:181                Total Time: 3:00    Patient tolerated procedure well  Notes: Hibiclens used since he is allergic to betadine.  Description of Procedure:  After marking the course of the secondary varicosities, the patient was placed on the operating table in the supine position, and the left leg was prepped and draped in sterile fashion.   Local anesthetic was administered and under ultrasound guidance the saphenous vein was accessed with a micro needle and guide wire; then the mirco puncture sheath was placed.  A guide wire was inserted saphenofemoral junction , followed by a 5 french sheath.  The position of the sheath and then the laser fiber below the junction was confirmed using the ultrasound.  Tumescent anesthesia was administered along the course of the saphenous vein using ultrasound guidance. The patient was placed in Trendelenburg position and protective laser glasses were placed on patient and staff, and the laser was fired at 15 watts continuous mode advancing 1-28msecond for a total of 3057 joules.     Steri strips were applied to the stab wounds and ABD pads and thigh high compression stockings were applied.  Ace wrap bandages were applied over the phlebectomy sites and at the top of the saphenofemoral junction. Blood loss was less than 15 cc.  The patient ambulated out of the operating  room having tolerated the procedure well.

## 2016-05-17 ENCOUNTER — Encounter: Payer: Self-pay | Admitting: Vascular Surgery

## 2016-05-20 ENCOUNTER — Encounter: Payer: Self-pay | Admitting: Vascular Surgery

## 2016-05-20 ENCOUNTER — Encounter (HOSPITAL_COMMUNITY): Payer: Non-veteran care

## 2016-05-20 ENCOUNTER — Ambulatory Visit: Payer: Non-veteran care | Admitting: Vascular Surgery

## 2016-05-27 ENCOUNTER — Ambulatory Visit: Payer: Non-veteran care | Admitting: Vascular Surgery

## 2016-05-27 ENCOUNTER — Ambulatory Visit (INDEPENDENT_AMBULATORY_CARE_PROVIDER_SITE_OTHER): Payer: Medicare Other | Admitting: Vascular Surgery

## 2016-05-27 ENCOUNTER — Encounter: Payer: Self-pay | Admitting: Vascular Surgery

## 2016-05-27 ENCOUNTER — Ambulatory Visit (HOSPITAL_COMMUNITY)
Admission: RE | Admit: 2016-05-27 | Discharge: 2016-05-27 | Disposition: A | Discharge status: Home / Self Care | Payer: Medicare Other | Source: Ambulatory Visit | Attending: Vascular Surgery | Admitting: Vascular Surgery

## 2016-05-27 VITALS — BP 102/73 | HR 96 | Temp 98.0°F | Resp 18 | Ht 70.0 in | Wt 321.4 lb

## 2016-05-27 DIAGNOSIS — Z9889 Other specified postprocedural states: Secondary | ICD-10-CM | POA: Insufficient documentation

## 2016-05-27 DIAGNOSIS — I83892 Varicose veins of left lower extremities with other complications: Secondary | ICD-10-CM

## 2016-05-27 DIAGNOSIS — I83812 Varicose veins of left lower extremities with pain: Secondary | ICD-10-CM | POA: Diagnosis not present

## 2016-05-27 NOTE — Progress Notes (Signed)
Subjective:     Patient ID: Keith [name], male   DOB: 10-20-1940, 76 y.o.   MRN: [id number]  HPI This 76 year old male returns 2 weeks post-laser ablation left great saphenous vein. Patient had pain and swelling in the left leg prior to the procedure and also recently healed a stasis ulcer. Patient previously undergone laser ablation in 2016 of the left leg and the vein had recanalized. Patient has had no pain or swelling below the knee. He had mild discomfort in the medial thigh with hypersensitivity of the skin. He has worn his elastic compression stocking and taken ibuprofen as instructed.  Past Medical History:  Diagnosis Date  . Cirrhosis of liver ([place])   . Diabetes mellitus   . Hepatic cancer ([place])   . Hypertension   . Prostate hypertrophy   . Stroke ([place])   . Varicose veins     Social History  Substance Use Topics  . Smoking status: Former Research scientist (life sciences)  . Smokeless tobacco: Former Systems developer    Quit date: 11/20/1983  . Alcohol use No    Family History  Problem Relation Age of Onset  . Stroke Father     Allergies  Allergen Reactions  . Fentanyl Itching and Rash  . Iodine Rash  . Merbromin Rash  . Other Rash    OPIODS OPIODS     Current Outpatient Prescriptions:  .  albuterol (PROVENTIL HFA;VENTOLIN HFA) 108 (90 BASE) MCG/ACT inhaler, Inhale 1-2 puffs into the lungs every 6 (six) hours as needed for wheezing or shortness of breath., Disp: , Rfl:  .  cholecalciferol (VITAMIN D) 1000 UNITS tablet, Take 1,000 Units by mouth daily., Disp: , Rfl:  .  clopidogrel (PLAVIX) 75 MG tablet, Take 75 mg by mouth daily., Disp: , Rfl:  .  diphenhydrAMINE (BENADRYL) 12.5 MG/5ML elixir, Take 5-10 mLs (12.5-25 mg total) by mouth every 4 (four) hours as needed for itching., Disp: 120 mL, Rfl: 0 .  finasteride (PROSCAR) 5 MG tablet, Take 5 mg by mouth daily.  , Disp: , Rfl:  .  folic acid (FOLVITE) 1 MG tablet, Take 1 mg by mouth daily., Disp: , Rfl:  .  furosemide (LASIX) 40 MG tablet,  Take 40 mg by mouth every morning., Disp: , Rfl:  .  hydrocerin (EUCERIN) CREA, Apply 1 application topically daily as needed (dry skin). , Disp: , Rfl:  .  ibuprofen (ADVIL,MOTRIN) 200 MG tablet, Take 200 mg by mouth every 6 (six) hours as needed for mild pain or moderate pain., Disp: , Rfl:  .  insulin aspart (NOVOLOG) 100 UNIT/ML injection, Inject 2-10 Units into the skin 3 (three) times daily before meals. BS 150-200=2 units; 201-250=4 units; 251-300= 6 units; 301-350= 8 units; 350 and greater is 10 units, Disp: , Rfl:  .  insulin glargine (LANTUS) 100 UNIT/ML injection, Inject 50 Units into the skin every evening. , Disp: , Rfl:  .  loratadine (CLARITIN) 10 MG tablet, Take 10 mg by mouth daily as needed for allergies., Disp: , Rfl:  .  pantoprazole (PROTONIX) 40 MG tablet, Take 40 mg by mouth daily., Disp: , Rfl:  .  polyvinyl alcohol (LIQUIFILM TEARS) 1.4 % ophthalmic solution, Place 1 drop into both eyes 2 (two) times daily as needed (dry eyes)., Disp: , Rfl:  .  pregabalin (LYRICA) 150 MG capsule, Take 150 mg by mouth 2 (two) times daily., Disp: , Rfl:  .  simvastatin (ZOCOR) 20 MG tablet, Take 10 mg by mouth every evening., Disp: , Rfl:  .  spironolactone (ALDACTONE) 100 MG tablet, Take 100 mg by mouth every morning. , Disp: , Rfl:  .  tamsulosin (FLOMAX) 0.4 MG CAPS, Take 0.4 mg by mouth daily after supper. , Disp: , Rfl:  .  traMADol (ULTRAM) 50 MG tablet, Take 1-2 tablets (50-100 mg total) by mouth every 6 (six) hours as needed (mild pain). (Patient taking differently: Take 50 mg by mouth 2 (two) times daily. ), Disp: 60 tablet, Rfl: 0 .  bisacodyl (DULCOLAX) 10 MG suppository, Place 1 suppository (10 mg total) rectally daily as needed. (Patient not taking: Reported on 03/04/2016), Disp: 12 suppository, Rfl: 0 .  docusate sodium 100 MG CAPS, Take 100 mg by mouth 2 (two) times daily. (Patient not taking: Reported on 03/04/2016), Disp: 10 capsule, Rfl: 0 .  iron polysaccharides (NIFEREX) 150  MG capsule, Take 1 capsule (150 mg total) by mouth 2 (two) times daily. (Patient not taking: Reported on 03/04/2016), Disp: 42 capsule, Rfl: 0 .  methocarbamol (ROBAXIN) 500 MG tablet, Take 1 tablet (500 mg total) by mouth every 6 (six) hours as needed. (Patient not taking: Reported on 03/04/2016), Disp: 80 tablet, Rfl: 0 .  metoCLOPramide (REGLAN) 5 MG tablet, Take 1-2 tablets (5-10 mg total) by mouth every 8 (eight) hours as needed (if ondansetron (ZOFRAN) ineffective.). (Patient not taking: Reported on 03/04/2016), Disp: 40 tablet, Rfl: 0 .  ondansetron (ZOFRAN) 4 MG tablet, Take 1 tablet (4 mg total) by mouth every 6 (six) hours as needed for nausea. (Patient not taking: Reported on 03/04/2016), Disp: 40 tablet, Rfl: 0 .  oxyCODONE (OXY IR/ROXICODONE) 5 MG immediate release tablet, Take 1-3 tablets (5-15 mg total) by mouth every 4 (four) hours as needed. (Patient not taking: Reported on 03/04/2016), Disp: 80 tablet, Rfl: 0 .  OxyCODONE (OXYCONTIN) 10 mg T12A 12 hr tablet, Take one tablet by mouth every 12 hours. Do not crush (Patient not taking: Reported on 03/04/2016), Disp: 60 tablet, Rfl: 0 .  polyethylene glycol (MIRALAX / GLYCOLAX) packet, Take 17 g by mouth daily as needed. (Patient not taking: Reported on 03/04/2016), Disp: 14 each, Rfl: 0 .  silver sulfADIAZINE (SILVADENE) 1 % cream, Apply topically daily. Apply to the right toe daily and cover with a dry dressing. (Patient not taking: Reported on 03/04/2016), Disp: 50 g, Rfl: 0 .  silver sulfADIAZINE (SILVADENE) 1 % cream, Apply topically daily. Apply to right toe daily and cover with a dry dressing daily. (Patient not taking: Reported on 03/04/2016), Disp: 50 g, Rfl: 0 .  silver sulfADIAZINE (SILVADENE) 1 % cream, Apply topically daily. (Patient not taking: Reported on 03/04/2016), Disp: 50 g, Rfl: 0 .  warfarin (COUMADIN) 5 MG tablet, Take 1.5-2 tablets (7.5-10 mg total) by mouth daily. Take Coumadin for three weeks for postoperative  protocol and then the patient may resume their previous Coumadin home regimen.  The dose may need to be adjusted based upon the INR.  Please follow the INR and titrate Coumadin dose for a therapeutic range between 2.0 and 3.0 INR.  After three weeks of Coumadin, the patient may resume their previous Coumadin home regimen and their Plavix. (Patient not taking: Reported on 03/04/2016), Disp: 45 tablet, Rfl: 0  Vitals:   05/27/16 1341  BP: 102/73  Pulse: 96  Resp: 18  Temp: 98 F (36.7 C)  TempSrc: Oral  SpO2: 98%  Weight: (!) 321 lb 6.4 oz (145.8 kg)  Height: 5' 10"  (1.778 m)    Body mass index is 46.12 kg/m.  Review of Systems Last chest pain, has chronic dyspnea on exertion, denies hemoptysis, claudication.    Objective:   Physical Exam BP 102/73 (BP Location: Left Arm, Patient Position: Sitting, Cuff Size: Large)   Pulse 96   Temp 98 F (36.7 C) (Oral)   Resp 18   Ht 5' 10"  (1.778 m)   Wt (!) 321 lb 6.4 oz (145.8 kg)   SpO2 98%   BMI 46.12 kg/m   Gen. well developed obese male in no apparent distress alert and oriented 3 Lungs no rhonchi or wheezing Left leg with mild discomfort to deep palpation over great saphenous vein. Chronic hyperpigmentation left lower leg with 1+ distal edema. 3+ dorsalis pedis pulse palpable.  Today I ordered a venous duplex exam the left leg which I reviewed and interpreted. There is no DVT. There is total closure of the left great saphenous vein from the proximal calf to near the saphenofemoral junction     Assessment:     Successful laser ablation left great saphenous vein. Patient had recanalized the left great saphenous vein since 2016 when it was previously treated with ablation    Plan:     Return on a when necessary basis

## 2016-05-28 ENCOUNTER — Encounter: Payer: Self-pay | Admitting: Physical Therapy

## 2016-05-28 ENCOUNTER — Ambulatory Visit: Payer: Medicare Other | Attending: Internal Medicine | Admitting: Physical Therapy

## 2016-05-28 DIAGNOSIS — M545 Low back pain: Secondary | ICD-10-CM | POA: Diagnosis not present

## 2016-05-28 DIAGNOSIS — R262 Difficulty in walking, not elsewhere classified: Secondary | ICD-10-CM | POA: Diagnosis not present

## 2016-05-28 DIAGNOSIS — G8929 Other chronic pain: Secondary | ICD-10-CM | POA: Diagnosis not present

## 2016-05-28 DIAGNOSIS — M6281 Muscle weakness (generalized): Secondary | ICD-10-CM | POA: Diagnosis not present

## 2016-05-28 NOTE — Therapy (Signed)
[place], [place], [address] Phone: [phone]   Fax:  [phone]  Physical Therapy Treatment  Patient Details  Name: Keith [name] MRN: [id number] Date of Birth: 01/03/1941 Referring Provider: Dr. Fransico Him Tisovec  Encounter Date: 05/28/2016      PT End of Session - 05/28/16 1043    Visit Number 5   Date for PT Re-Evaluation 06/25/16   PT Start Time 0930   PT Stop Time 1010   PT Time Calculation (min) 40 min   Activity Tolerance Patient tolerated treatment well   Behavior During Therapy [facility] for tasks assessed/performed      Past Medical History:  Diagnosis Date  . Cirrhosis of liver ([place])   . Diabetes mellitus   . Hepatic cancer ([place])   . Hypertension   . Prostate hypertrophy   . Stroke ([place])   . Varicose veins     Past Surgical History:  Procedure Laterality Date  . APPENDECTOMY    . EYE SURGERY     bilateral cataract   . left arm surgery     age 67  . left hand surgery     tendons ripped on left hand  . LIVER RESECTION    . TONSILLECTOMY    . TOTAL KNEE ARTHROPLASTY Left 12/14/2012   Procedure: LEFT TOTAL KNEE ARTHROPLASTY;  Surgeon: Gearlean Alf, MD;  Location[facility];  Service: Orthopedics;  Laterality: Left;    There were no vitals filed for this visit.      Subjective Assessment - 05/28/16 0928    Subjective Patient reports that he had some vericose vein procedures about 3 weeks ago so he has not been in.  His health issues limit his ability to come to PT.  He reports that he will be having a pacemaker placed in the next few months as well as some liver testing   Currently in Pain? Yes   Pain Score 5    Pain Location Leg   Pain Orientation Right;Left   Aggravating Factors  walking   Pain Relieving Factors rest                         [facility] Adult PT Treatment/Exercise - 05/28/16 0001      Lumbar Exercises: Aerobic   Stationary Bike  NuStep 6 min, lvl 4   UBE (Upper Arm Bike) level 5 x 4 minutes     Lumbar Exercises: Machines for Strengthening   Other Lumbar Machine Exercise chest press 10# 3x10   Other Lumbar Machine Exercise seated rows and lats 20# 2x112 each     Lumbar Exercises: Standing   Other Standing Lumbar Exercises seated toe taps and heel raises     Lumbar Exercises: Seated   Long Arc Quad on Chair Strengthening;Right;Left;2 sets;10 reps;Weights     Manual Therapy   Other Manual Therapy STM to the upper traps and rhomboids                  PT Short Term Goals - 04/17/16 1036      PT SHORT TERM GOAL #1   Status Achieved           PT Long Term Goals - 05/28/16 1047      PT LONG TERM GOAL #1   Title Patient to improve TUG score to 20 seconds (04/04/16)   Status Partially Met     PT LONG TERM  GOAL #2   Title Patient to report ability to walk 350 feet without rest or increase in low back pain (04/04/16)   Status Partially Met     PT LONG TERM GOAL #3   Title Patient to demonstrate improved tissue quality as noted in improved B hamstring and quad tightness (04/04/16)   Status Partially Met     PT LONG TERM GOAL #4   Title be independent with gym exercises   Status On-going               Plan - 05/28/16 1044    Clinical Impression Statement Patient has had numerous health issues with a heart ablation, vericose vein laser treatment and other issues, this has limited his ability to attend PT, he however is still very weak in the arms, legs and having difficulty walking, now using walls and furniture to locomote due to balance issues.  He will be having further health studies, tests and surgeries in the next month or two, but has made appointments to see [place] to help the issues that we are involved in including weakness, gait and pain   Rehab Potential Fair   PT Frequency 2x / week   PT Duration 4 weeks   PT Treatment/Interventions ADLs/Self Care Home  Management;Cryotherapy;Presenter, broadcasting;Therapeutic exercise;Therapeutic activities;Gait training;Stair training;Patient/family education;Manual techniques;Passive range of motion   PT Next Visit Plan LE stretching, postural stability, LE strengthening, balance activity, address pain   Consulted and Agree with Plan of Care Patient      Patient will benefit from skilled therapeutic intervention in order to improve the following deficits and impairments:  Abnormal gait, Decreased activity tolerance, Decreased balance, Decreased range of motion, Decreased mobility, Decreased strength, Difficulty walking, Impaired flexibility, Pain, Increased fascial restricitons, Increased muscle spasms  Visit Diagnosis: Muscle weakness (generalized) - Plan: PT plan of care cert/re-cert  Difficulty in walking, not elsewhere classified - Plan: PT plan of care cert/re-cert  Chronic low back pain, unspecified back pain laterality, with sciatica presence unspecified - Plan: PT plan of care cert/re-cert     Problem List Patient Active Problem List   Diagnosis Date Noted  . Varicose veins of left lower extremity with complications 28/00/3491  . Knee pain, acute 01/26/2013  . Constipation 12/22/2012  . Neuropathy ([place]) 12/22/2012  . Prostate hypertrophy 12/22/2012  . Acute blood loss anemia 12/22/2012  . Hyperlipidemia 12/22/2012  . Diabetes ([place]) 12/17/2012  . GERD (gastroesophageal reflux disease) 12/17/2012  . Hypertension 12/17/2012  . Thrombocytopenia, unspecified 12/17/2012  . Postoperative anemia due to acute blood loss 12/15/2012  . Hyponatremia 12/15/2012  . OA (osteoarthritis) of knee 12/14/2012    [name], PT 05/28/2016, 10:50 AM  [address], [place], [address] Phone: [phone]   Fax:  [phone]  Name: Keith [name] MRN: [id number] Date  of Birth: Jun 25, 1940

## 2016-05-30 ENCOUNTER — Encounter: Payer: Self-pay | Admitting: Physical Therapy

## 2016-05-30 ENCOUNTER — Ambulatory Visit: Payer: Medicare Other | Admitting: Physical Therapy

## 2016-05-30 DIAGNOSIS — G8929 Other chronic pain: Secondary | ICD-10-CM

## 2016-05-30 DIAGNOSIS — M545 Low back pain: Secondary | ICD-10-CM

## 2016-05-30 DIAGNOSIS — M6281 Muscle weakness (generalized): Secondary | ICD-10-CM | POA: Diagnosis not present

## 2016-05-30 DIAGNOSIS — R262 Difficulty in walking, not elsewhere classified: Secondary | ICD-10-CM

## 2016-05-30 NOTE — Therapy (Signed)
[place], [place], [address] Phone: [phone]   Fax:  [phone]  Physical Therapy Treatment  Patient Details  Name: Keith [name] MRN: [id number] Date of Birth: 09-14-1940 Referring Provider: Dr. Fransico Him Tisovec  Encounter Date: 05/30/2016      PT End of Session - 05/30/16 0956    Visit Number 6   Date for PT Re-Evaluation 06/25/16   PT Start Time 0757   PT Stop Time 0845   PT Time Calculation (min) 48 min   Activity Tolerance Patient tolerated treatment well   Behavior During Therapy [facility] for tasks assessed/performed      Past Medical History:  Diagnosis Date  . Cirrhosis of liver ([place])   . Diabetes mellitus   . Hepatic cancer ([place])   . Hypertension   . Prostate hypertrophy   . Stroke ([place])   . Varicose veins     Past Surgical History:  Procedure Laterality Date  . APPENDECTOMY    . EYE SURGERY     bilateral cataract   . left arm surgery     age 76  . left hand surgery     tendons ripped on left hand  . LIVER RESECTION    . TONSILLECTOMY    . TOTAL KNEE ARTHROPLASTY Left 12/14/2012   Procedure: LEFT TOTAL KNEE ARTHROPLASTY;  Surgeon: Gearlean Alf, MD;  Location: [facility];  Service: Orthopedics;  Laterality: Left;    There were no vitals filed for this visit.      Subjective Assessment - 05/30/16 0811    Subjective Patient reports his shoulders felt better with the STM last visit, reports his legs feel weak.   Currently in Pain? Yes   Pain Score 5    Pain Location Back   Pain Orientation Upper                         [facility] Adult PT Treatment/Exercise - 05/30/16 0001      Lumbar Exercises: Aerobic   Stationary Bike NuStep 7 min, lvl 5   UBE (Upper Arm Bike) level 5 x 5 minutes     Lumbar Exercises: Machines for Strengthening   Cybex Knee Flexion 25# 3x12   Other Lumbar Machine Exercise chest press 10# 3x10   Other Lumbar Machine  Exercise seated rows and lats 20# 2x112 each     Lumbar Exercises: Standing   Other Standing Lumbar Exercises seated toe taps and heel raises     Lumbar Exercises: Seated   Long Arc Quad on Chair Strengthening;Right;Left;2 sets;10 reps;Weights   LAQ on Chair Weights (lbs) 3     Manual Therapy   Other Manual Therapy STM to the upper traps and rhomboids                  PT Short Term Goals - 04/17/16 1036      PT SHORT TERM GOAL #1   Status Achieved           PT Long Term Goals - 05/28/16 1047      PT LONG TERM GOAL #1   Title Patient to improve TUG score to 20 seconds (04/04/16)   Status Partially Met     PT LONG TERM GOAL #2   Title Patient to report ability to walk 350 feet without rest or increase in low back pain (04/04/16)   Status Partially Met     PT LONG  TERM GOAL #3   Title Patient to demonstrate improved tissue quality as noted in improved B hamstring and quad tightness (04/04/16)   Status Partially Met     PT LONG TERM GOAL #4   Title be independent with gym exercises   Status On-going               Plan - 05/30/16 0957    Clinical Impression Statement Patient with some difficulty walking, using the walls for stability, he denies wanting to use a walker or cane even though we have suggested it.   PT Next Visit Plan LE stretching, postural stability, LE strengthening, balance activity, address pain   Consulted and Agree with Plan of Care Patient      Patient will benefit from skilled therapeutic intervention in order to improve the following deficits and impairments:  Decreased coordination  Visit Diagnosis: Muscle weakness (generalized)  Difficulty in walking, not elsewhere classified  Chronic low back pain, unspecified back pain laterality, with sciatica presence unspecified     Problem List Patient Active Problem List   Diagnosis Date Noted  . Varicose veins of left lower extremity with complications 96/41/8937  . Knee  pain, acute 01/26/2013  . Constipation 12/22/2012  . Neuropathy ([place]) 12/22/2012  . Prostate hypertrophy 12/22/2012  . Acute blood loss anemia 12/22/2012  . Hyperlipidemia 12/22/2012  . Diabetes ([place]) 12/17/2012  . GERD (gastroesophageal reflux disease) 12/17/2012  . Hypertension 12/17/2012  . Thrombocytopenia, unspecified 12/17/2012  . Postoperative anemia due to acute blood loss 12/15/2012  . Hyponatremia 12/15/2012  . OA (osteoarthritis) of knee 12/14/2012    [name], PT 05/30/2016, 9:58 AM  [place] [address] [place], [place], [address] Phone: [phone]   Fax:  [phone]  Name: Keith [name] MRN: [id number] Date of Birth: [birth year]

## 2016-05-31 ENCOUNTER — Ambulatory Visit: Payer: Medicare Other | Admitting: Physical Therapy

## 2016-05-31 ENCOUNTER — Encounter: Payer: Self-pay | Admitting: Physical Therapy

## 2016-05-31 DIAGNOSIS — M545 Low back pain: Secondary | ICD-10-CM | POA: Diagnosis not present

## 2016-05-31 DIAGNOSIS — G8929 Other chronic pain: Secondary | ICD-10-CM | POA: Diagnosis not present

## 2016-05-31 DIAGNOSIS — M6281 Muscle weakness (generalized): Secondary | ICD-10-CM

## 2016-05-31 DIAGNOSIS — R262 Difficulty in walking, not elsewhere classified: Secondary | ICD-10-CM | POA: Diagnosis not present

## 2016-05-31 NOTE — Therapy (Signed)
[place], [place], [address] Phone: [phone]   Fax:  [phone]  Physical Therapy Treatment  Patient Details  Name: Keith [name] MRN: [id number] Date of Birth: 1940-07-11 Referring Provider: Dr. Fransico Him Tisovec  Encounter Date: 05/31/2016      PT End of Session - 05/31/16 0954    Visit Number 7   Date for PT Re-Evaluation 06/25/16   PT Start Time 0930   PT Stop Time 1015   PT Time Calculation (min) 45 min   Activity Tolerance Patient tolerated treatment well   Behavior During Therapy [facility] for tasks assessed/performed      Past Medical History:  Diagnosis Date  . Cirrhosis of liver ([place])   . Diabetes mellitus   . Hepatic cancer ([place])   . Hypertension   . Prostate hypertrophy   . Stroke ([place])   . Varicose veins     Past Surgical History:  Procedure Laterality Date  . APPENDECTOMY    . EYE SURGERY     bilateral cataract   . left arm surgery     age 53  . left hand surgery     tendons ripped on left hand  . LIVER RESECTION    . TONSILLECTOMY    . TOTAL KNEE ARTHROPLASTY Left 12/14/2012   Procedure: LEFT TOTAL KNEE ARTHROPLASTY;  Surgeon: Gearlean Alf, MD;  [facility];  Service: Orthopedics;  Laterality: Left;    There were no vitals filed for this visit.      Subjective Assessment - 05/31/16 0941    Subjective Patient reports difficulty walking, reports that he feels like he is getting a little stronger but still struggles to get up and down and walk   Currently in Pain? Yes   Pain Score 5    Pain Location Back   Pain Orientation Upper                         [facility] Adult PT Treatment/Exercise - 05/31/16 0001      Lumbar Exercises: Aerobic   Stationary Bike NuStep 8 min, lvl 5   UBE (Upper Arm Bike) level 5 x 5 minutes     Lumbar Exercises: Machines for Strengthening   Cybex Knee Flexion 25# 3x12   Other Lumbar Machine Exercise  chest press 10# 3x10   Other Lumbar Machine Exercise seated rows and lats 20# 2x112 each     Lumbar Exercises: Standing   Other Standing Lumbar Exercises seated toe taps and heel raises     Lumbar Exercises: Seated   Long Arc Quad on Chair Strengthening;Right;Left;2 sets;10 reps;Weights   LAQ on Chair Weights (lbs) 3     Manual Therapy   Other Manual Therapy STM to the upper traps and rhomboids                  PT Short Term Goals - 04/17/16 1036      PT SHORT TERM GOAL #1   Status Achieved           PT Long Term Goals - 05/31/16 1104      PT LONG TERM GOAL #1   Title Patient to improve TUG score to 20 seconds (04/04/16)   Status Partially Met     PT LONG TERM GOAL #2   Title Patient to report ability to walk 350 feet without rest or increase in low back pain (04/04/16)  Status Partially Met               Plan - 05/31/16 1049    Clinical Impression Statement Patient reports that he is unsteady on his feet.  He is having some increased pain and tightness in the mms of the back and shoulders, I feel this could be from him gaurding due to the unsteadiness   Rehab Potential Good   PT Next Visit Plan LE stretching, postural stability, LE strengthening, balance activity, address pain   Consulted and Agree with Plan of Care Patient      Patient will benefit from skilled therapeutic intervention in order to improve the following deficits and impairments:  Abnormal gait, Decreased activity tolerance, Decreased mobility, Decreased strength, Decreased endurance, Decreased range of motion, Difficulty walking, Impaired flexibility, Pain  Visit Diagnosis: Muscle weakness (generalized)  Difficulty in walking, not elsewhere classified  Chronic low back pain, unspecified back pain laterality, with sciatica presence unspecified     Problem List Patient Active Problem List   Diagnosis Date Noted  . Varicose veins of left lower extremity with complications  44/06/4740  . Knee pain, acute 01/26/2013  . Constipation 12/22/2012  . Neuropathy ([place]) 12/22/2012  . Prostate hypertrophy 12/22/2012  . Acute blood loss anemia 12/22/2012  . Hyperlipidemia 12/22/2012  . Diabetes ([place]) 12/17/2012  . GERD (gastroesophageal reflux disease) 12/17/2012  . Hypertension 12/17/2012  . Thrombocytopenia, unspecified 12/17/2012  . Postoperative anemia due to acute blood loss 12/15/2012  . Hyponatremia 12/15/2012  . OA (osteoarthritis) of knee 12/14/2012    [name], PT 05/31/2016, 11:04 AM  [place] [address] [place], [place], [address] Phone: [phone]   Fax:  [phone]  Name: Keith [name] MRN: [id number] Date of Birth: 04/02/1941

## 2016-06-03 DIAGNOSIS — I4439 Other atrioventricular block: Secondary | ICD-10-CM | POA: Diagnosis not present

## 2016-06-03 DIAGNOSIS — I454 Nonspecific intraventricular block: Secondary | ICD-10-CM | POA: Diagnosis not present

## 2016-06-03 DIAGNOSIS — G459 Transient cerebral ischemic attack, unspecified: Secondary | ICD-10-CM | POA: Diagnosis not present

## 2016-06-03 DIAGNOSIS — I442 Atrioventricular block, complete: Secondary | ICD-10-CM | POA: Diagnosis not present

## 2016-06-03 DIAGNOSIS — K766 Portal hypertension: Secondary | ICD-10-CM | POA: Diagnosis not present

## 2016-06-03 DIAGNOSIS — K219 Gastro-esophageal reflux disease without esophagitis: Secondary | ICD-10-CM | POA: Diagnosis not present

## 2016-06-03 DIAGNOSIS — C220 Liver cell carcinoma: Secondary | ICD-10-CM | POA: Diagnosis not present

## 2016-06-03 DIAGNOSIS — I8500 Esophageal varices without bleeding: Secondary | ICD-10-CM | POA: Diagnosis not present

## 2016-06-03 DIAGNOSIS — K7460 Unspecified cirrhosis of liver: Secondary | ICD-10-CM | POA: Diagnosis not present

## 2016-06-03 DIAGNOSIS — K7581 Nonalcoholic steatohepatitis (NASH): Secondary | ICD-10-CM | POA: Diagnosis not present

## 2016-06-05 DIAGNOSIS — E7800 Pure hypercholesterolemia, unspecified: Secondary | ICD-10-CM | POA: Diagnosis not present

## 2016-06-05 DIAGNOSIS — I4439 Other atrioventricular block: Secondary | ICD-10-CM | POA: Diagnosis not present

## 2016-06-05 DIAGNOSIS — R001 Bradycardia, unspecified: Secondary | ICD-10-CM | POA: Diagnosis not present

## 2016-06-05 DIAGNOSIS — I350 Nonrheumatic aortic (valve) stenosis: Secondary | ICD-10-CM | POA: Diagnosis not present

## 2016-06-07 ENCOUNTER — Ambulatory Visit: Payer: Medicare Other | Attending: Internal Medicine | Admitting: Physical Therapy

## 2016-06-07 ENCOUNTER — Encounter: Payer: Self-pay | Admitting: Physical Therapy

## 2016-06-07 DIAGNOSIS — R262 Difficulty in walking, not elsewhere classified: Secondary | ICD-10-CM | POA: Diagnosis present

## 2016-06-07 DIAGNOSIS — M545 Low back pain: Secondary | ICD-10-CM | POA: Insufficient documentation

## 2016-06-07 DIAGNOSIS — G8929 Other chronic pain: Secondary | ICD-10-CM | POA: Insufficient documentation

## 2016-06-07 DIAGNOSIS — M6281 Muscle weakness (generalized): Secondary | ICD-10-CM | POA: Insufficient documentation

## 2016-06-07 NOTE — Therapy (Signed)
[place], [place], [address] Phone: [phone]   Fax:  [phone]  Physical Therapy Treatment  Patient Details  Name: Keith [name] MRN: [id number] Date of Birth: February 13, 1941 Referring Provider: Dr. Fransico Him Tisovec  Encounter Date: 06/07/2016      PT End of Session - 06/07/16 0952    Visit Number 8   Date for PT Re-Evaluation 06/25/16   PT Start Time 0930   PT Stop Time 1015   PT Time Calculation (min) 45 min   Activity Tolerance Patient tolerated treatment well   Behavior During Therapy [facility] for tasks assessed/performed      Past Medical History:  Diagnosis Date  . Cirrhosis of liver ([place])   . Diabetes mellitus   . Hepatic cancer ([place])   . Hypertension   . Prostate hypertrophy   . Stroke ([place])   . Varicose veins     Past Surgical History:  Procedure Laterality Date  . APPENDECTOMY    . EYE SURGERY     bilateral cataract   . left arm surgery     age 31  . left hand surgery     tendons ripped on left hand  . LIVER RESECTION    . TONSILLECTOMY    . TOTAL KNEE ARTHROPLASTY Left 12/14/2012   Procedure: LEFT TOTAL KNEE ARTHROPLASTY;  Surgeon: Gearlean Alf, MD;  [facility];  Service: Orthopedics;  Laterality: Left;    There were no vitals filed for this visit.      Subjective Assessment - 06/07/16 0936    Subjective Patient reports that the pain is feeling better, feels like a tight mm now   Currently in Pain? Yes   Pain Score 3    Pain Location Back            [facility] PT Assessment - 06/07/16 0001      Timed Up and Go Test   Normal TUG (seconds) 33                     [facility] Adult PT Treatment/Exercise - 06/07/16 0001      Lumbar Exercises: Aerobic   UBE (Upper Arm Bike) level 5 x 5 minutes     Lumbar Exercises: Machines for Strengthening   Other Lumbar Machine Exercise chest press 10# 3x10   Other Lumbar Machine Exercise seated rows  and lats 20# 2x112 each     Lumbar Exercises: Seated   Long Arc Quad on Chair Strengthening;Right;Left;2 sets;10 reps;Weights   LAQ on Chair Weights (lbs) 3     Knee/Hip Exercises: Stretches   Passive Hamstring Stretch 3 reps;30 seconds   Passive Hamstring Stretch Limitations seated     Knee/Hip Exercises: Standing   Hip Flexion Both;2 sets;10 reps   Hip Flexion Limitations 3#     Manual Therapy   Other Manual Therapy STM to the upper traps and rhomboids                  PT Short Term Goals - 04/17/16 1036      PT SHORT TERM GOAL #1   Status Achieved           PT Long Term Goals - 06/07/16 0954      PT LONG TERM GOAL #1   Title Patient to improve TUG score to 20 seconds (04/04/16)   Status Partially Met  Plan - 06/07/16 0953    Clinical Impression Statement Patient is having less pain, he is still with a lot of tightness and soreness in the back and rhomboids, trying to add LE exercises but he is very tender around the ankles due to waht looks like cellulitis   PT Next Visit Plan LE stretching, postural stability, LE strengthening, balance activity, address pain   Consulted and Agree with Plan of Care Patient      Patient will benefit from skilled therapeutic intervention in order to improve the following deficits and impairments:  Abnormal gait, Decreased activity tolerance, Decreased mobility, Decreased strength, Decreased endurance, Decreased range of motion, Difficulty walking, Impaired flexibility, Pain  Visit Diagnosis: Muscle weakness (generalized)  Difficulty in walking, not elsewhere classified  Chronic low back pain, unspecified back pain laterality, with sciatica presence unspecified     Problem List Patient Active Problem List   Diagnosis Date Noted  . Varicose veins of left lower extremity with complications 72/90/2111  . Knee pain, acute 01/26/2013  . Constipation 12/22/2012  . Neuropathy ([place]) 12/22/2012  .  Prostate hypertrophy 12/22/2012  . Acute blood loss anemia 12/22/2012  . Hyperlipidemia 12/22/2012  . Diabetes ([place]) 12/17/2012  . GERD (gastroesophageal reflux disease) 12/17/2012  . Hypertension 12/17/2012  . Thrombocytopenia, unspecified 12/17/2012  . Postoperative anemia due to acute blood loss 12/15/2012  . Hyponatremia 12/15/2012  . OA (osteoarthritis) of knee 12/14/2012    [name], PT 06/07/2016, 10:34 AM  [place] [address] [place], [place], [address] Phone: [phone]   Fax:  [phone]  Name: ELZIE KNISLEY MRN: [id number] Date of Birth: [birth year]

## 2016-06-11 DIAGNOSIS — K769 Liver disease, unspecified: Secondary | ICD-10-CM | POA: Diagnosis not present

## 2016-06-11 DIAGNOSIS — C220 Liver cell carcinoma: Secondary | ICD-10-CM | POA: Diagnosis not present

## 2016-06-11 DIAGNOSIS — K7460 Unspecified cirrhosis of liver: Secondary | ICD-10-CM | POA: Diagnosis not present

## 2016-06-11 DIAGNOSIS — I864 Gastric varices: Secondary | ICD-10-CM | POA: Diagnosis not present

## 2016-06-13 DIAGNOSIS — Z794 Long term (current) use of insulin: Secondary | ICD-10-CM | POA: Diagnosis not present

## 2016-06-13 DIAGNOSIS — M170 Bilateral primary osteoarthritis of knee: Secondary | ICD-10-CM | POA: Diagnosis not present

## 2016-06-13 DIAGNOSIS — E7800 Pure hypercholesterolemia, unspecified: Secondary | ICD-10-CM | POA: Diagnosis not present

## 2016-06-19 ENCOUNTER — Encounter: Payer: Medicare Other | Admitting: Physical Therapy

## 2016-06-19 DIAGNOSIS — G629 Polyneuropathy, unspecified: Secondary | ICD-10-CM | POA: Diagnosis not present

## 2016-06-21 ENCOUNTER — Encounter: Payer: Self-pay | Admitting: Physical Therapy

## 2016-06-21 ENCOUNTER — Ambulatory Visit: Payer: Medicare Other | Admitting: Physical Therapy

## 2016-06-21 DIAGNOSIS — R262 Difficulty in walking, not elsewhere classified: Secondary | ICD-10-CM

## 2016-06-21 DIAGNOSIS — M6281 Muscle weakness (generalized): Secondary | ICD-10-CM | POA: Diagnosis not present

## 2016-06-21 DIAGNOSIS — M545 Low back pain: Secondary | ICD-10-CM

## 2016-06-21 DIAGNOSIS — G8929 Other chronic pain: Secondary | ICD-10-CM

## 2016-06-21 NOTE — Therapy (Signed)
[place], [place], [address] Phone: [phone]   Fax:  [phone]  Physical Therapy Treatment  Patient Details  Name: Keith [name] MRN: [id number] Date of Birth: Aug 16, 1940 Referring Provider: Dr. Fransico Him Tisovec  Encounter Date: 06/21/2016      PT End of Session - 06/21/16 1157    Visit Number 9   Date for PT Re-Evaluation 06/25/16   PT Start Time 0931   PT Stop Time 1014   PT Time Calculation (min) 43 min   Activity Tolerance Patient tolerated treatment well   Behavior During Therapy [facility] for tasks assessed/performed      Past Medical History:  Diagnosis Date  . Cirrhosis of liver ([place])   . Diabetes mellitus   . Hepatic cancer ([place])   . Hypertension   . Prostate hypertrophy   . Stroke ([place])   . Varicose veins     Past Surgical History:  Procedure Laterality Date  . APPENDECTOMY    . EYE SURGERY     bilateral cataract   . left arm surgery     age 20  . left hand surgery     tendons ripped on left hand  . LIVER RESECTION    . TONSILLECTOMY    . TOTAL KNEE ARTHROPLASTY Left 12/14/2012   Procedure: LEFT TOTAL KNEE ARTHROPLASTY;  Surgeon: Gearlean Alf, MD;  [facility];  Service: Orthopedics;  Laterality: Left;    There were no vitals filed for this visit.      Subjective Assessment - 06/21/16 0935    Subjective Patient saw a neurologist yesterday and has a referral for gait and balance, we have already been working on this.  He reports no falls, just a general fatigue and unsteadiness due to the severe neuropathy of the lower legs   Currently in Pain? Yes   Pain Score 5    Pain Location Back   Pain Orientation Upper   Aggravating Factors  standing, walking   Pain Relieving Factors rest                         [facility] Adult PT Treatment/Exercise - 06/21/16 0001      High Level Balance   High Level Balance Activities Side stepping   High  Level Balance Comments static standing and reaching, weight shifts, ball toss     Lumbar Exercises: Stretches   Passive Hamstring Stretch 3 reps;20 seconds     Lumbar Exercises: Aerobic   Stationary Bike NuStep 8 min, lvl 5   UBE (Upper Arm Bike) level 5 x 5 minutes     Lumbar Exercises: Machines for Strengthening   Other Lumbar Machine Exercise chest press 10# 3x10   Other Lumbar Machine Exercise seated rows and lats 20# 2x112 each     Lumbar Exercises: Standing   Other Standing Lumbar Exercises seated blue tband trunk rotation   Other Standing Lumbar Exercises seated toe taps and heel raises     Lumbar Exercises: Seated   Long Arc Quad on Chair Strengthening;Right;Left;2 sets;10 reps;Weights   LAQ on Chair Weights (lbs) 3     Manual Therapy   Other Manual Therapy STM to the upper traps and rhomboids                  PT Short Term Goals - 06/21/16 1159      PT SHORT TERM GOAL #2   Title  Patient to improve TUG score to 24 seconds demonstrating improved functional gait (03/07/16)   Status Partially Met           PT Long Term Goals - 06/21/16 1159      PT LONG TERM GOAL #1   Title Patient to improve TUG score to 20 seconds (04/04/16)   Status Partially Met     PT LONG TERM GOAL #2   Title Patient to report ability to walk 350 feet without rest or increase in low back pain (04/04/16)   Status Partially Met     PT LONG TERM GOAL #4   Title be independent with gym exercises   Status On-going               Plan - 06/21/16 1157    Clinical Impression Statement Patient reports some increased pain and stiffness in the upper trunk, I feel that he has to guard and uses his upper body so much as he has severe neuropathy and cannot feel the feet, he has not had any falls but is unsteady when he stands and tends to use the wall for stability   PT Next Visit Plan patient reports that he may not be able to come in for awhile, he reports some medical testing and  a possible pace maker in the future   Consulted and Agree with Plan of Care Patient      Patient will benefit from skilled therapeutic intervention in order to improve the following deficits and impairments:  Abnormal gait, Decreased activity tolerance, Decreased mobility, Decreased strength, Decreased endurance, Decreased range of motion, Difficulty walking, Impaired flexibility, Pain  Visit Diagnosis: Muscle weakness (generalized)  Difficulty in walking, not elsewhere classified  Chronic low back pain, unspecified back pain laterality, with sciatica presence unspecified     Problem List Patient Active Problem List   Diagnosis Date Noted  . Varicose veins of left lower extremity with complications 95/74/7340  . Knee pain, acute 01/26/2013  . Constipation 12/22/2012  . Neuropathy ([place]) 12/22/2012  . Prostate hypertrophy 12/22/2012  . Acute blood loss anemia 12/22/2012  . Hyperlipidemia 12/22/2012  . Diabetes ([place]) 12/17/2012  . GERD (gastroesophageal reflux disease) 12/17/2012  . Hypertension 12/17/2012  . Thrombocytopenia, unspecified 12/17/2012  . Postoperative anemia due to acute blood loss 12/15/2012  . Hyponatremia 12/15/2012  . OA (osteoarthritis) of knee 12/14/2012    [name], PT 06/21/2016, 12:01 PM  [address] [place], [place], [address] Phone: [phone]   Fax:  [phone]  Name: Keith [name] MRN: [id number] Date of Birth: 04-Jun-1940

## 2016-06-25 DIAGNOSIS — Z950 Presence of cardiac pacemaker: Secondary | ICD-10-CM

## 2016-06-25 DIAGNOSIS — I441 Atrioventricular block, second degree: Secondary | ICD-10-CM | POA: Diagnosis not present

## 2016-06-25 DIAGNOSIS — I455 Other specified heart block: Secondary | ICD-10-CM | POA: Diagnosis not present

## 2016-06-25 HISTORY — DX: Presence of cardiac pacemaker: Z95.0

## 2016-06-26 DIAGNOSIS — I442 Atrioventricular block, complete: Secondary | ICD-10-CM | POA: Diagnosis not present

## 2016-06-26 DIAGNOSIS — Z950 Presence of cardiac pacemaker: Secondary | ICD-10-CM | POA: Diagnosis not present

## 2016-06-26 DIAGNOSIS — R918 Other nonspecific abnormal finding of lung field: Secondary | ICD-10-CM | POA: Diagnosis not present

## 2016-07-09 DIAGNOSIS — Z6841 Body Mass Index (BMI) 40.0 and over, adult: Secondary | ICD-10-CM | POA: Diagnosis not present

## 2016-07-09 DIAGNOSIS — R2689 Other abnormalities of gait and mobility: Secondary | ICD-10-CM | POA: Diagnosis not present

## 2016-07-09 DIAGNOSIS — Z950 Presence of cardiac pacemaker: Secondary | ICD-10-CM | POA: Diagnosis not present

## 2016-07-09 DIAGNOSIS — R202 Paresthesia of skin: Secondary | ICD-10-CM | POA: Diagnosis not present

## 2016-07-09 DIAGNOSIS — E113493 Type 2 diabetes mellitus with severe nonproliferative diabetic retinopathy without macular edema, bilateral: Secondary | ICD-10-CM | POA: Diagnosis not present

## 2016-07-24 DIAGNOSIS — C220 Liver cell carcinoma: Secondary | ICD-10-CM | POA: Diagnosis not present

## 2016-07-24 DIAGNOSIS — K7460 Unspecified cirrhosis of liver: Secondary | ICD-10-CM | POA: Diagnosis not present

## 2016-08-22 DIAGNOSIS — I350 Nonrheumatic aortic (valve) stenosis: Secondary | ICD-10-CM | POA: Diagnosis not present

## 2016-08-22 DIAGNOSIS — I517 Cardiomegaly: Secondary | ICD-10-CM | POA: Diagnosis not present

## 2016-08-22 DIAGNOSIS — Z45018 Encounter for adjustment and management of other part of cardiac pacemaker: Secondary | ICD-10-CM | POA: Diagnosis not present

## 2016-08-22 DIAGNOSIS — Z950 Presence of cardiac pacemaker: Secondary | ICD-10-CM | POA: Diagnosis not present

## 2016-08-22 DIAGNOSIS — I4430 Unspecified atrioventricular block: Secondary | ICD-10-CM | POA: Diagnosis not present

## 2016-08-22 DIAGNOSIS — R918 Other nonspecific abnormal finding of lung field: Secondary | ICD-10-CM | POA: Diagnosis not present

## 2016-08-22 DIAGNOSIS — I442 Atrioventricular block, complete: Secondary | ICD-10-CM | POA: Diagnosis not present

## 2016-09-18 DIAGNOSIS — I442 Atrioventricular block, complete: Secondary | ICD-10-CM | POA: Diagnosis not present

## 2016-09-18 DIAGNOSIS — I4430 Unspecified atrioventricular block: Secondary | ICD-10-CM | POA: Diagnosis not present

## 2016-09-18 DIAGNOSIS — I350 Nonrheumatic aortic (valve) stenosis: Secondary | ICD-10-CM | POA: Diagnosis not present

## 2016-09-18 DIAGNOSIS — Z45018 Encounter for adjustment and management of other part of cardiac pacemaker: Secondary | ICD-10-CM | POA: Diagnosis not present

## 2016-10-02 ENCOUNTER — Ambulatory Visit: Payer: Medicare Other | Attending: Internal Medicine | Admitting: Physical Therapy

## 2016-10-02 ENCOUNTER — Encounter: Payer: Self-pay | Admitting: Physical Therapy

## 2016-10-02 DIAGNOSIS — R262 Difficulty in walking, not elsewhere classified: Secondary | ICD-10-CM | POA: Diagnosis present

## 2016-10-02 DIAGNOSIS — R296 Repeated falls: Secondary | ICD-10-CM

## 2016-10-02 DIAGNOSIS — M6281 Muscle weakness (generalized): Secondary | ICD-10-CM | POA: Insufficient documentation

## 2016-10-02 NOTE — Therapy (Signed)
[place], [place], [address] Phone: [phone]   Fax:  [phone]  Physical Therapy Evaluation  Patient Details  Name: Keith [name] MRN: [id number] Date of Birth: 01-14-1941 Referring Provider: Osborne Casco  Encounter Date: 10/02/2016      PT End of Session - 10/02/16 1508    Visit Number 1   Date for PT Re-Evaluation 12/02/16   PT Start Time 3716   PT Stop Time 1505   PT Time Calculation (min) 37 min   Activity Tolerance Patient tolerated treatment well   Behavior During Therapy [facility] for tasks assessed/performed      Past Medical History:  Diagnosis Date  . Cirrhosis of liver ([place])   . Diabetes mellitus   . Hepatic cancer ([place])   . Hypertension   . Prostate hypertrophy   . Stroke ([place])   . Varicose veins     Past Surgical History:  Procedure Laterality Date  . APPENDECTOMY    . EYE SURGERY     bilateral cataract   . left arm surgery     age 62  . left hand surgery     tendons ripped on left hand  . LIVER RESECTION    . TONSILLECTOMY    . TOTAL KNEE ARTHROPLASTY Left 12/14/2012   Procedure: LEFT TOTAL KNEE ARTHROPLASTY;  Surgeon: Gearlean Alf, MD;  Location: [facility];  Service: Orthopedics;  Laterality: Left;    There were no vitals filed for this visit.       Subjective Assessment - 10/02/16 1434    Subjective Patient was seen here about 4-6 months ago for back pain and some difficulty walking.  He has not been back to [place] for 3 months +.  He has had some significant complications of heart issues.  He reports that he had significant AV blockage.  He had a pacemaker implanted 06/25/16.  He reports that since that time he has had at least 3 falls.  His wife reports to me that he has been very weak and not been able to do much, she also reports could be fear due to the neuropathy   Pertinent History DM, neuropathy   Patient Stated Goals walk better, feel more stable and safer    Currently in Pain? Yes   Pain Score 4    Pain Location Back   Pain Orientation Upper;Mid;Lower   Pain Descriptors / Indicators Aching   Pain Type Chronic pain;Neuropathic pain   Pain Onset More than a month ago   Pain Frequency Intermittent   Aggravating Factors  standing and walking will increase quad and back pain up to 8-10/10    Pain Relieving Factors rest will decrease the pain    Effect of Pain on Daily Activities can't walk, fatigue, fear of falling            [facility] PT Assessment - 10/02/16 0001      Assessment   Medical Diagnosis Weakness   Referring Provider Tisovec   Onset Date/Surgical Date 06/25/16   Prior Therapy for TKR and difficulty with gait     Precautions   Precautions ICD/Pacemaker     Balance Screen   Has the patient fallen in the past 6 months Yes   How many times? 3   Has the patient had a decrease in activity level because of a fear of falling?  Yes   Is the patient reluctant to leave their home because of a fear  of falling?  Yes     Home Environment   Additional Comments lives with spouse, goes up and down 2 steps into the home     Prior Function   Level of Independence Independent with household mobility without device;Independent with community mobility with device   Vocation Retired   Leisure does not exercise     PROM   Overall PROM Comments Tight HS bilaterally, tight quad bilaterally     Strength   Right Hip Flexion 4-/5   Right Hip Extension 4-/5   Left Hip Flexion 4-/5   Left Hip Extension 4-/5   Right Knee Flexion 4-/5   Right Knee Extension 4-/5   Left Knee Flexion 4-/5   Left Knee Extension 4-/5   Right Ankle Dorsiflexion 4-/5   Left Ankle Dorsiflexion 4-/5     Palpation   Palpation comment very tight and some tenderness in the quads and in the back     Ambulation/Gait   Gait Comments uses a SPC, slow, WBOS, uses a SPC, shuffles feet     Standardized Balance Assessment   Standardized Balance Assessment Berg Balance  Test     Timed Up and Go Test   Normal TUG (seconds) 33  c/o back fatigue with this            Objective measurements completed on examination: See above findings.          [facility] Adult PT Treatment/Exercise - 10/02/16 0001      Lumbar Exercises: Stretches   Passive Hamstring Stretch 3 reps;20 seconds     Lumbar Exercises: Aerobic   Stationary Bike NuStep 6 min, lvl 5                  PT Short Term Goals - 10/02/16 1514      PT SHORT TERM GOAL #1   Title patient to be independent with initial HEP    Time 2   Period Weeks   Status Achieved           PT Long Term Goals - 10/02/16 1514      PT LONG TERM GOAL #1   Title Patient to improve TUG score to 20 seconds    Time 8   Period Weeks   Status New     PT LONG TERM GOAL #2   Title Patient to report ability to walk 350 feet without rest or increase in low back pain    Time 8   Period Weeks   Status New     PT LONG TERM GOAL #3   Title increase LE strength to 4+/5   Time 8   Period Weeks   Status New     PT LONG TERM GOAL #4   Title be independent with gym exercises   Time 8   Period Weeks   Status New                Plan - 10/02/16 1510    Clinical Impression Statement Patient returns to [place] with 3 recent falls, he had a pacemaker place on 06/25/16, he reports some complications with his heart, and that with the falls and the fatigue from the heart isses he has not done much and is very weak.  His wife reports that there are times in the AM that he cannot stand up straight and it is difficult for him to walk to the kitchen, his TUG is 33 seconds, about what it was in March, his strength is  4-/5 a little less than what it was in March.   Clinical Presentation Unstable   Clinical Decision Making Moderate   Rehab Potential Fair   PT Frequency 3x / week   PT Duration 8 weeks   PT Treatment/Interventions ADLs/Self Care Home Management;Cryotherapy;Corporate treasurer;Therapeutic exercise;Therapeutic activities;Gait training;Stair training;Patient/family education;Manual techniques;Passive range of motion   PT Next Visit Plan Slowly increase strength, endurance and balance   Consulted and Agree with Plan of Care Patient      Patient will benefit from skilled therapeutic intervention in order to improve the following deficits and impairments:  Abnormal gait, Decreased activity tolerance, Decreased mobility, Decreased strength, Decreased endurance, Decreased range of motion, Difficulty walking, Impaired flexibility, Pain  Visit Diagnosis: Muscle weakness (generalized) - Plan: PT plan of care cert/re-cert  Difficulty in walking, not elsewhere classified - Plan: PT plan of care cert/re-cert  Repeated falls - Plan: PT plan of care cert/re-cert      G-Codes - 59/29/24 1516    Functional Assessment Tool Used (Outpatient Only) foto 75% limited   Functional Limitation Mobility: Walking and moving around   Mobility: Walking and Moving Around Current Status ([id number]) At least 60 percent but less than 80 percent impaired, limited or restricted   Mobility: Walking and Moving Around Goal Status [phone]) At least 40 percent but less than 60 percent impaired, limited or restricted       Problem List Patient Active Problem List   Diagnosis Date Noted  . Varicose veins of left lower extremity with complications 11/65/7903  . Knee pain, acute 01/26/2013  . Constipation 12/22/2012  . Neuropathy 12/22/2012  . Prostate hypertrophy 12/22/2012  . Acute blood loss anemia 12/22/2012  . Hyperlipidemia 12/22/2012  . Diabetes ([place]) 12/17/2012  . GERD (gastroesophageal reflux disease) 12/17/2012  . Hypertension 12/17/2012  . Thrombocytopenia, unspecified ([place]) 12/17/2012  . Postoperative anemia due to acute blood loss 12/15/2012  . Hyponatremia 12/15/2012  . OA (osteoarthritis) of knee 12/14/2012    [name], PT 10/02/2016,  3:19 PM  [place], [place], [address] Phone: [phone]   Fax:  [phone]  Name: Keith [name] MRN: [id number] Date of Birth: July 31, 1940

## 2016-10-07 DIAGNOSIS — K7581 Nonalcoholic steatohepatitis (NASH): Secondary | ICD-10-CM | POA: Diagnosis not present

## 2016-10-07 DIAGNOSIS — I8510 Secondary esophageal varices without bleeding: Secondary | ICD-10-CM | POA: Diagnosis not present

## 2016-10-07 DIAGNOSIS — G4733 Obstructive sleep apnea (adult) (pediatric): Secondary | ICD-10-CM | POA: Diagnosis not present

## 2016-10-07 DIAGNOSIS — K766 Portal hypertension: Secondary | ICD-10-CM | POA: Diagnosis not present

## 2016-10-07 DIAGNOSIS — K7469 Other cirrhosis of liver: Secondary | ICD-10-CM | POA: Diagnosis not present

## 2016-10-07 DIAGNOSIS — I10 Essential (primary) hypertension: Secondary | ICD-10-CM | POA: Diagnosis not present

## 2016-10-07 DIAGNOSIS — K3189 Other diseases of stomach and duodenum: Secondary | ICD-10-CM | POA: Diagnosis not present

## 2016-10-07 DIAGNOSIS — K219 Gastro-esophageal reflux disease without esophagitis: Secondary | ICD-10-CM | POA: Diagnosis not present

## 2016-10-07 DIAGNOSIS — I8500 Esophageal varices without bleeding: Secondary | ICD-10-CM | POA: Diagnosis not present

## 2016-10-07 DIAGNOSIS — I350 Nonrheumatic aortic (valve) stenosis: Secondary | ICD-10-CM | POA: Diagnosis not present

## 2016-10-07 DIAGNOSIS — C220 Liver cell carcinoma: Secondary | ICD-10-CM | POA: Diagnosis not present

## 2016-10-10 ENCOUNTER — Ambulatory Visit: Payer: Medicare Other | Attending: Internal Medicine | Admitting: Physical Therapy

## 2016-10-10 ENCOUNTER — Encounter: Payer: Self-pay | Admitting: Physical Therapy

## 2016-10-10 DIAGNOSIS — R262 Difficulty in walking, not elsewhere classified: Secondary | ICD-10-CM | POA: Insufficient documentation

## 2016-10-10 DIAGNOSIS — R296 Repeated falls: Secondary | ICD-10-CM

## 2016-10-10 DIAGNOSIS — G8929 Other chronic pain: Secondary | ICD-10-CM | POA: Diagnosis present

## 2016-10-10 DIAGNOSIS — M545 Low back pain: Secondary | ICD-10-CM | POA: Diagnosis present

## 2016-10-10 DIAGNOSIS — M6281 Muscle weakness (generalized): Secondary | ICD-10-CM

## 2016-10-10 NOTE — Therapy (Signed)
[place], [place], [address] Phone: [phone]   Fax:  [phone]  Physical Therapy Treatment  Patient Details  Name: Keith [name] MRN: [id number] Date of Birth: 27-Aug-1940 Referring Provider: Osborne Casco  Encounter Date: 10/10/2016      PT End of Session - 10/10/16 1748    Visit Number 2   Date for PT Re-Evaluation 12/02/16   PT Start Time 1655   PT Stop Time 1745   PT Time Calculation (min) 50 min   Activity Tolerance Patient tolerated treatment well   Behavior During Therapy [facility] for tasks assessed/performed      Past Medical History:  Diagnosis Date  . Cirrhosis of liver ([place])   . Diabetes mellitus   . Hepatic cancer ([place])   . Hypertension   . Prostate hypertrophy   . Stroke ([place])   . Varicose veins     Past Surgical History:  Procedure Laterality Date  . APPENDECTOMY    . EYE SURGERY     bilateral cataract   . left arm surgery     age 56  . left hand surgery     tendons ripped on left hand  . LIVER RESECTION    . TONSILLECTOMY    . TOTAL KNEE ARTHROPLASTY Left 12/14/2012   Procedure: LEFT TOTAL KNEE ARTHROPLASTY;  Surgeon: Gearlean Alf, MD;  [facility];  Service: Orthopedics;  Laterality: Left;    There were no vitals filed for this visit.      Subjective Assessment - 10/10/16 1701    Subjective Patient reports that he was at a party yesterday, was outside some, reports that he was mostly sitting but reports that he is very tired and really did not think he would be able to walk in to rehab   Currently in Pain? Yes   Pain Score 4    Pain Location Back                         [facility] Adult PT Treatment/Exercise - 10/10/16 0001      Ambulation/Gait   Gait Comments HHA 180'x2, very slow     High Level Balance   High Level Balance Comments static standing with some weight shifts, standing ball toss, did not have him throw due to his vision      Lumbar Exercises: Aerobic   Stationary Bike NuStep 6 min, lvl 4     Lumbar Exercises: Machines for Strengthening   Leg Press 30# 2x10, this was very difficult for hin to ge on and off   Other Lumbar Machine Exercise green tband scapular stabilization, green tband seated hip abduction, ball b/n knees squeeze     Lumbar Exercises: Seated   Long Arc Quad on Chair Strengthening;Right;Left;2 sets;10 reps;Weights   LAQ on Chair Weights (lbs) 2   LAQ on Chair Limitations Then we did knee flexion with green tband                  PT Short Term Goals - 10/02/16 1514      PT SHORT TERM GOAL #1   Title patient to be independent with initial HEP    Time 2   Period Weeks   Status Achieved           PT Long Term Goals - 10/02/16 1514      PT LONG TERM GOAL #1   Title Patient to improve TUG  score to 20 seconds    Time 8   Period Weeks   Status New     PT LONG TERM GOAL #2   Title Patient to report ability to walk 350 feet without rest or increase in low back pain    Time 8   Period Weeks   Status New     PT LONG TERM GOAL #3   Title increase LE strength to 4+/5   Time 8   Period Weeks   Status New     PT LONG TERM GOAL #4   Title be independent with gym exercises   Time 8   Period Weeks   Status New               Plan - 10/10/16 1748    Clinical Impression Statement Patient very slow and careful with his gait, a combination of severe neuropathy in his feet and poor vision, he performed exercises well occasional loss of balance with standing activities, he had a lot of difficulty on and off the leg press as he seemed to be very afrain of falling off of it.   PT Next Visit Plan Slowly increase strength, endurance and balance   Consulted and Agree with Plan of Care Patient      Patient will benefit from skilled therapeutic intervention in order to improve the following deficits and impairments:     Visit Diagnosis: Muscle weakness  (generalized)  Difficulty in walking, not elsewhere classified  Repeated falls     Problem List Patient Active Problem List   Diagnosis Date Noted  . Varicose veins of left lower extremity with complications 16/01/9603  . Knee pain, acute 01/26/2013  . Constipation 12/22/2012  . Neuropathy 12/22/2012  . Prostate hypertrophy 12/22/2012  . Acute blood loss anemia 12/22/2012  . Hyperlipidemia 12/22/2012  . Diabetes ([place]) 12/17/2012  . GERD (gastroesophageal reflux disease) 12/17/2012  . Hypertension 12/17/2012  . Thrombocytopenia, unspecified ([place]) 12/17/2012  . Postoperative anemia due to acute blood loss 12/15/2012  . Hyponatremia 12/15/2012  . OA (osteoarthritis) of knee 12/14/2012    [name], PT 10/10/2016, 5:52 PM  [place] [address] [place], [place], [address] Phone: [phone]   Fax:  [phone]  Name: MANOJ ENRIQUEZ MRN: [id number] Date of Birth: Oct 27, 1940

## 2016-10-14 ENCOUNTER — Encounter: Payer: Self-pay | Admitting: Physical Therapy

## 2016-10-14 ENCOUNTER — Ambulatory Visit: Payer: Medicare Other | Admitting: Physical Therapy

## 2016-10-14 DIAGNOSIS — M6281 Muscle weakness (generalized): Secondary | ICD-10-CM

## 2016-10-14 DIAGNOSIS — M545 Low back pain: Secondary | ICD-10-CM

## 2016-10-14 DIAGNOSIS — G8929 Other chronic pain: Secondary | ICD-10-CM

## 2016-10-14 DIAGNOSIS — R296 Repeated falls: Secondary | ICD-10-CM

## 2016-10-14 DIAGNOSIS — R262 Difficulty in walking, not elsewhere classified: Secondary | ICD-10-CM

## 2016-10-14 NOTE — Therapy (Signed)
[place], [place], [address] Phone: [phone]   Fax:  [phone]  Physical Therapy Treatment  Patient Details  Name: Keith [name] MRN: [id number] Date of Birth: 1940-10-20 Referring Provider: Osborne Casco  Encounter Date: 10/14/2016      PT End of Session - 10/14/16 1412    Visit Number 3   Date for PT Re-Evaluation 12/02/16   PT Start Time 1310   PT Stop Time 1400   PT Time Calculation (min) 50 min   Activity Tolerance Patient tolerated treatment well   Behavior During Therapy [facility] for tasks assessed/performed      Past Medical History:  Diagnosis Date  . Cirrhosis of liver ([place])   . Diabetes mellitus   . Hepatic cancer ([place])   . Hypertension   . Prostate hypertrophy   . Stroke ([place])   . Varicose veins     Past Surgical History:  Procedure Laterality Date  . APPENDECTOMY    . EYE SURGERY     bilateral cataract   . left arm surgery     age 76  . left hand surgery     tendons ripped on left hand  . LIVER RESECTION    . TONSILLECTOMY    . TOTAL KNEE ARTHROPLASTY Left 12/14/2012   Procedure: LEFT TOTAL KNEE ARTHROPLASTY;  Surgeon: Gearlean Alf, MD;  [facility];  Service: Orthopedics;  Laterality: Left;    There were no vitals filed for this visit.      Subjective Assessment - 10/14/16 1315    Subjective Reports that he was very sore after the last visit   Currently in Pain? Yes   Pain Score 5    Pain Location Leg   Pain Orientation Upper                         [facility] Adult PT Treatment/Exercise - 10/14/16 0001      Ambulation/Gait   Gait Comments HHA 180'x2, very slow     High Level Balance   High Level Balance Comments static standing with some weight shifts, standing ball toss, did not have him throw due to his vision     Lumbar Exercises: Aerobic   Stationary Bike NuStep 6 min, lvl 4   UBE (Upper Arm Bike) level 1 x 5 minutes     Lumbar Exercises: Machines for Strengthening   Other Lumbar Machine Exercise green tband scapular stabilization, green tband seated hip abduction, ball b/n knees squeeze     Lumbar Exercises: Standing   Other Standing Lumbar Exercises standing 2.5 # hip flexion, hip abduction     Lumbar Exercises: Seated   Long Arc Quad on Chair Strengthening;Right;Left;2 sets;10 reps;Weights   LAQ on Chair Weights (lbs) 2   LAQ on Chair Limitations Then we did knee flexion with green tband   Hip Flexion on Ball 20 reps   Hip Flexion on Ball Limitations on mat no ball     Lumbar Exercises: Supine   Other Supine Lumbar Exercises 2.5 # hip flexion while seated                  PT Short Term Goals - 10/02/16 1514      PT SHORT TERM GOAL #1   Title patient to be independent with initial HEP    Time 2   Period Weeks   Status Achieved  PT Long Term Goals - 10/02/16 1514      PT LONG TERM GOAL #1   Title Patient to improve TUG score to 20 seconds    Time 8   Period Weeks   Status New     PT LONG TERM GOAL #2   Title Patient to report ability to walk 350 feet without rest or increase in low back pain    Time 8   Period Weeks   Status New     PT LONG TERM GOAL #3   Title increase LE strength to 4+/5   Time 8   Period Weeks   Status New     PT LONG TERM GOAL #4   Title be independent with gym exercises   Time 8   Period Weeks   Status New               Plan - 10/14/16 1413    Clinical Impression Statement Patient does have difficulty getting his left leg up into the car when getting in.  C/O fatigue at the end of the treatment.   PT Next Visit Plan Slowly increase strength, endurance and balance   Consulted and Agree with Plan of Care Patient      Patient will benefit from skilled therapeutic intervention in order to improve the following deficits and impairments:  Abnormal gait, Decreased activity tolerance, Decreased mobility, Decreased strength,  Decreased endurance, Decreased range of motion, Difficulty walking, Impaired flexibility, Pain  Visit Diagnosis: Muscle weakness (generalized)  Difficulty in walking, not elsewhere classified  Repeated falls  Chronic low back pain, unspecified back pain laterality, with sciatica presence unspecified     Problem List Patient Active Problem List   Diagnosis Date Noted  . Varicose veins of left lower extremity with complications 34/19/6222  . Knee pain, acute 01/26/2013  . Constipation 12/22/2012  . Neuropathy 12/22/2012  . Prostate hypertrophy 12/22/2012  . Acute blood loss anemia 12/22/2012  . Hyperlipidemia 12/22/2012  . Diabetes ([place]) 12/17/2012  . GERD (gastroesophageal reflux disease) 12/17/2012  . Hypertension 12/17/2012  . Thrombocytopenia, unspecified ([place]) 12/17/2012  . Postoperative anemia due to acute blood loss 12/15/2012  . Hyponatremia 12/15/2012  . OA (osteoarthritis) of knee 12/14/2012    [name], PT 10/14/2016, 2:14 PM  [place], [place], [address] Phone: [phone]   Fax:  [phone]  Name: DAMETRI OZBURN MRN: [id number] Date of Birth: [birth year]

## 2016-10-16 ENCOUNTER — Encounter: Payer: Self-pay | Admitting: Physical Therapy

## 2016-10-16 ENCOUNTER — Ambulatory Visit: Payer: Medicare Other | Admitting: Physical Therapy

## 2016-10-16 DIAGNOSIS — M6281 Muscle weakness (generalized): Secondary | ICD-10-CM | POA: Diagnosis not present

## 2016-10-16 DIAGNOSIS — R262 Difficulty in walking, not elsewhere classified: Secondary | ICD-10-CM

## 2016-10-16 DIAGNOSIS — R296 Repeated falls: Secondary | ICD-10-CM

## 2016-10-16 DIAGNOSIS — M545 Low back pain: Secondary | ICD-10-CM

## 2016-10-16 DIAGNOSIS — G8929 Other chronic pain: Secondary | ICD-10-CM

## 2016-10-16 NOTE — Therapy (Signed)
[place], [place], [address] Phone: [phone]   Fax:  [phone]  Physical Therapy Treatment  Patient Details  Name: Keith [name] MRN: [id number] Date of Birth: 02-10-1941 Referring Provider: Osborne Casco  Encounter Date: 10/16/2016      PT End of Session - 10/16/16 1445    Visit Number 4   Date for PT Re-Evaluation 12/02/16   PT Start Time 1350   PT Stop Time 1440   PT Time Calculation (min) 50 min   Activity Tolerance Patient tolerated treatment well   Behavior During Therapy [facility] for tasks assessed/performed      Past Medical History:  Diagnosis Date  . Cirrhosis of liver ([place])   . Diabetes mellitus   . Hepatic cancer ([place])   . Hypertension   . Prostate hypertrophy   . Stroke ([place])   . Varicose veins     Past Surgical History:  Procedure Laterality Date  . APPENDECTOMY    . EYE SURGERY     bilateral cataract   . left arm surgery     age 31  . left hand surgery     tendons ripped on left hand  . LIVER RESECTION    . TONSILLECTOMY    . TOTAL KNEE ARTHROPLASTY Left 12/14/2012   Procedure: LEFT TOTAL KNEE ARTHROPLASTY;  Surgeon: Gearlean Alf, MD;  [facility];  Service: Orthopedics;  Laterality: Left;    There were no vitals filed for this visit.      Subjective Assessment - 10/16/16 1412    Subjective Patient reports that he is feeling very fatigued today and almost cancelled.   Currently in Pain? Yes   Pain Score 4    Pain Location Back   Pain Orientation Lower                         [facility] Adult PT Treatment/Exercise - 10/16/16 0001      Ambulation/Gait   Gait Comments HHA 180'x2, working on speed     Lumbar Exercises: Aerobic   Stationary Bike NuStep 6 min, lvl 4   UBE (Upper Arm Bike) level 3 x 5 minutes     Lumbar Exercises: Machines for Strengthening   Cybex Knee Extension 5# 3x10   Cybex Knee Flexion 25# 3x10   Other Lumbar  Machine Exercise red tband ankle PF, DF 2x15 each   Other Lumbar Machine Exercise green tband scapular stabilization, green tband seated hip abduction, ball b/n knees squeeze     Knee/Hip Exercises: Standing   Hip Flexion Both;2 sets;10 reps   Hip Flexion Limitations 5#   Hip Abduction Both;2 sets;10 reps   Abduction Limitations 5#     Knee/Hip Exercises: Seated   Long Arc Quad Both;2 sets;10 reps   Long Arc Quad Weight 5 lbs.   Marching Limitations 2x10 each leg 5#                  PT Short Term Goals - 10/02/16 1514      PT SHORT TERM GOAL #1   Title patient to be independent with initial HEP    Time 2   Period Weeks   Status Achieved           PT Long Term Goals - 10/16/16 1448      PT LONG TERM GOAL #2   Title Patient to report ability to walk 350 feet without rest or  increase in low back pain    Status On-going               Plan - 10/16/16 1447    Clinical Impression Statement Did well with [place] adding weight and exrecises, he is very unsteady with gait due to the neuropathy and poor eyesight.  Difficulty moving legs up into car   PT Next Visit Plan  increase strength, endurance and balance   Consulted and Agree with Plan of Care Patient      Patient will benefit from skilled therapeutic intervention in order to improve the following deficits and impairments:  Abnormal gait, Decreased activity tolerance, Decreased mobility, Decreased strength, Decreased endurance, Decreased range of motion, Difficulty walking, Impaired flexibility, Pain  Visit Diagnosis: Muscle weakness (generalized)  Difficulty in walking, not elsewhere classified  Repeated falls  Chronic low back pain, unspecified back pain laterality, with sciatica presence unspecified     Problem List Patient Active Problem List   Diagnosis Date Noted  . Varicose veins of left lower extremity with complications 62/22/9798  . Knee pain, acute 01/26/2013  . Constipation 12/22/2012   . Neuropathy 12/22/2012  . Prostate hypertrophy 12/22/2012  . Acute blood loss anemia 12/22/2012  . Hyperlipidemia 12/22/2012  . Diabetes ([place]) 12/17/2012  . GERD (gastroesophageal reflux disease) 12/17/2012  . Hypertension 12/17/2012  . Thrombocytopenia, unspecified ([place]) 12/17/2012  . Postoperative anemia due to acute blood loss 12/15/2012  . Hyponatremia 12/15/2012  . OA (osteoarthritis) of knee 12/14/2012    [name], PT 10/16/2016, 2:48 PM  [place], [place], [address] Phone: [phone]   Fax:  [phone]  Name: Keith [name] MRN: [id number] Date of Birth: 1940/10/18

## 2016-10-17 ENCOUNTER — Encounter: Payer: Self-pay | Admitting: Physical Therapy

## 2016-10-17 ENCOUNTER — Ambulatory Visit: Payer: Medicare Other | Admitting: Physical Therapy

## 2016-10-17 DIAGNOSIS — R296 Repeated falls: Secondary | ICD-10-CM

## 2016-10-17 DIAGNOSIS — M6281 Muscle weakness (generalized): Secondary | ICD-10-CM | POA: Diagnosis not present

## 2016-10-17 DIAGNOSIS — M545 Low back pain: Secondary | ICD-10-CM

## 2016-10-17 DIAGNOSIS — G8929 Other chronic pain: Secondary | ICD-10-CM

## 2016-10-17 DIAGNOSIS — R262 Difficulty in walking, not elsewhere classified: Secondary | ICD-10-CM

## 2016-10-17 NOTE — Therapy (Signed)
[place], [place], [address] Phone: [phone]   Fax:  [phone]  Physical Therapy Treatment  Patient Details  Name: Keith [name] MRN: [id number] Date of Birth: February 09, 1941 Referring Provider: Osborne Casco  Encounter Date: 10/17/2016      PT End of Session - 10/17/16 1527    Visit Number 5   Date for PT Re-Evaluation 12/02/16   PT Start Time 1310   PT Stop Time 1400   PT Time Calculation (min) 50 min   Activity Tolerance Patient tolerated treatment well   Behavior During Therapy [facility] for tasks assessed/performed      Past Medical History:  Diagnosis Date  . Cirrhosis of liver ([place])   . Diabetes mellitus   . Hepatic cancer ([place])   . Hypertension   . Prostate hypertrophy   . Stroke ([place])   . Varicose veins     Past Surgical History:  Procedure Laterality Date  . APPENDECTOMY    . EYE SURGERY     bilateral cataract   . left arm surgery     age 48  . left hand surgery     tendons ripped on left hand  . LIVER RESECTION    . TONSILLECTOMY    . TOTAL KNEE ARTHROPLASTY Left 12/14/2012   Procedure: LEFT TOTAL KNEE ARTHROPLASTY;  Surgeon: Gearlean Alf, MD;  [facility];  Service: Orthopedics;  Laterality: Left;    There were no vitals filed for this visit.      Subjective Assessment - 10/17/16 1519    Subjective Patient reports that he was in a lot more pain after last visit, pain in th back and the legs   Currently in Pain? Yes   Pain Score 7    Pain Location Back   Pain Orientation Lower   Aggravating Factors  activity                         [facility] Adult PT Treatment/Exercise - 10/17/16 0001      Ambulation/Gait   Gait Comments HHA 180'x2, working on speed     Lumbar Exercises: Aerobic   Stationary Bike NuStep 6 min, lvl 4   UBE (Upper Arm Bike) level 3 x 5 minutes     Lumbar Exercises: Machines for Strengthening   Cybex Knee Extension 5#  3x10   Cybex Knee Flexion 25# 3x10   Other Lumbar Machine Exercise red tband ankle PF, DF 2x15 each, seated ankle sit/fit exercises   Other Lumbar Machine Exercise green tband scapular stabilization, green tband seated hip abduction, ball b/n knees squeeze     Lumbar Exercises: Standing   Other Standing Lumbar Exercises standing 2.5 # hip flexion, hip abduction     Lumbar Exercises: Seated   Long Arc Quad on Chair Strengthening;Right;Left;2 sets;10 reps;Weights   LAQ on Chair Weights (lbs) 2   LAQ on Chair Limitations Then we did marching with 2# while seated     Knee/Hip Exercises: Standing   Hip Abduction Both;2 sets;10 reps   Abduction Limitations 2#                  PT Short Term Goals - 10/02/16 1514      PT SHORT TERM GOAL #1   Title patient to be independent with initial HEP    Time 2   Period Weeks   Status Achieved  PT Long Term Goals - 10/17/16 1528      PT LONG TERM GOAL #2   Title Patient to report ability to walk 350 feet without rest or increase in low back pain    Status On-going               Plan - 10/17/16 1528    Clinical Impression Statement Patient much less steady with his gait today, needed HHA for all transfers and gait, he c/o vertigo.     PT Next Visit Plan  increase strength, endurance and balance   Consulted and Agree with Plan of Care Patient      Patient will benefit from skilled therapeutic intervention in order to improve the following deficits and impairments:  Abnormal gait, Decreased activity tolerance, Decreased mobility, Decreased strength, Decreased endurance, Decreased range of motion, Difficulty walking, Impaired flexibility, Pain  Visit Diagnosis: Muscle weakness (generalized)  Difficulty in walking, not elsewhere classified  Repeated falls  Chronic low back pain, unspecified back pain laterality, with sciatica presence unspecified     Problem List Patient Active Problem List   Diagnosis  Date Noted  . Varicose veins of left lower extremity with complications 24/23/5361  . Knee pain, acute 01/26/2013  . Constipation 12/22/2012  . Neuropathy 12/22/2012  . Prostate hypertrophy 12/22/2012  . Acute blood loss anemia 12/22/2012  . Hyperlipidemia 12/22/2012  . Diabetes ([place]) 12/17/2012  . GERD (gastroesophageal reflux disease) 12/17/2012  . Hypertension 12/17/2012  . Thrombocytopenia, unspecified ([place]) 12/17/2012  . Postoperative anemia due to acute blood loss 12/15/2012  . Hyponatremia 12/15/2012  . OA (osteoarthritis) of knee 12/14/2012    [name], PT 10/17/2016, 3:29 PM  [place], [place], [address] Phone: [phone]   Fax:  [phone]  Name: TREYDON HENRICKS MRN: [id number] Date of Birth: Nov 26, 1940

## 2016-10-21 ENCOUNTER — Ambulatory Visit: Payer: Medicare Other | Admitting: Physical Therapy

## 2016-10-21 ENCOUNTER — Encounter: Payer: Self-pay | Admitting: Physical Therapy

## 2016-10-21 DIAGNOSIS — M545 Low back pain: Secondary | ICD-10-CM

## 2016-10-21 DIAGNOSIS — G8929 Other chronic pain: Secondary | ICD-10-CM

## 2016-10-21 DIAGNOSIS — R262 Difficulty in walking, not elsewhere classified: Secondary | ICD-10-CM

## 2016-10-21 DIAGNOSIS — R296 Repeated falls: Secondary | ICD-10-CM

## 2016-10-21 DIAGNOSIS — M6281 Muscle weakness (generalized): Secondary | ICD-10-CM

## 2016-10-21 NOTE — Therapy (Signed)
[place], [place], [address] Phone: [phone]   Fax:  [phone]  Physical Therapy Treatment  Patient Details  Name: Keith [name] MRN: [id number] Date of Birth: [birth year] Referring Provider: Osborne Casco  Encounter Date: 10/21/2016      PT End of Session - 10/21/16 1404    Visit Number 6   Date for PT Re-Evaluation 12/02/16   PT Start Time 5176   PT Stop Time 1354   PT Time Calculation (min) 48 min   Activity Tolerance Patient tolerated treatment well   Behavior During Therapy [facility] for tasks assessed/performed      Past Medical History:  Diagnosis Date  . Cirrhosis of liver ([place])   . Diabetes mellitus   . Hepatic cancer ([place])   . Hypertension   . Prostate hypertrophy   . Stroke ([place])   . Varicose veins     Past Surgical History:  Procedure Laterality Date  . APPENDECTOMY    . EYE SURGERY     bilateral cataract   . left arm surgery     age 61  . left hand surgery     tendons ripped on left hand  . LIVER RESECTION    . TONSILLECTOMY    . TOTAL KNEE ARTHROPLASTY Left 12/14/2012   Procedure: LEFT TOTAL KNEE ARTHROPLASTY;  Surgeon: Gearlean Alf, MD;  [facility];  Service: Orthopedics;  Laterality: Left;    There were no vitals filed for this visit.      Subjective Assessment - 10/21/16 1312    Subjective Patient reports a lot of difficulty getting the left leg into the car last visit, reports that he had to get help to lift the left leg up into the car.   Currently in Pain? Yes   Pain Score 4    Pain Location Back   Pain Descriptors / Indicators Aching                         [facility] Adult PT Treatment/Exercise - 10/21/16 0001      Ambulation/Gait   Gait Comments HHA 180'x2, he c/o more numbness today, had to use a HHA and use the wall for balance     Lumbar Exercises: Stretches   Passive Hamstring Stretch 3 reps;20 seconds   Piriformis  Stretch 4 reps;20 seconds   Piriformis Stretch Limitations seated     Lumbar Exercises: Aerobic   Stationary Bike NuStep 6 min, lvl 4   UBE (Upper Arm Bike) level 4 x 5 minutes     Lumbar Exercises: Machines for Strengthening   Other Lumbar Machine Exercise red tband ankle PF, DF 2x15 each, seated ankle sit/fit exercises   Other Lumbar Machine Exercise green tband scapular stabilization, green tband seated hip abduction, ball b/n knees squeeze     Lumbar Exercises: Standing   Other Standing Lumbar Exercises standing 2.5 # hip flexion, hip abduction     Lumbar Exercises: Seated   Long Arc Quad on Chair Strengthening;Right;Left;2 sets;10 reps;Weights   LAQ on Chair Weights (lbs) 2   LAQ on Chair Limitations Then we did marching with 2# while seated, also seated had foot propped on a 4" step and had him doing SLR's                  PT Short Term Goals - 10/02/16 1514      PT SHORT TERM GOAL #1  Title patient to be independent with initial HEP    Time 2   Period Weeks   Status Achieved           PT Long Term Goals - 10/17/16 1528      PT LONG TERM GOAL #2   Title Patient to report ability to walk 350 feet without rest or increase in low back pain    Status On-going               Plan - 10/21/16 1404    Clinical Impression Statement Patient continues to report increased numbness in the legs and unsteady on his feet.  He also reported increased difficulty with getting in and out of his truck because he could not lift his leg up that high.  We worked on this a few different ways in sitting and in standing.  I also tried to stretch the HS, calf and piriformis in sitting   PT Next Visit Plan  increase strength, endurance and balance   Consulted and Agree with Plan of Care Patient      Patient will benefit from skilled therapeutic intervention in order to improve the following deficits and impairments:  Abnormal gait, Decreased activity tolerance, Decreased  mobility, Decreased strength, Decreased endurance, Decreased range of motion, Difficulty walking, Impaired flexibility, Pain  Visit Diagnosis: Muscle weakness (generalized)  Difficulty in walking, not elsewhere classified  Repeated falls  Chronic low back pain, unspecified back pain laterality, with sciatica presence unspecified     Problem List Patient Active Problem List   Diagnosis Date Noted  . Varicose veins of left lower extremity with complications 37/34/2876  . Knee pain, acute 01/26/2013  . Constipation 12/22/2012  . Neuropathy 12/22/2012  . Prostate hypertrophy 12/22/2012  . Acute blood loss anemia 12/22/2012  . Hyperlipidemia 12/22/2012  . Diabetes ([place]) 12/17/2012  . GERD (gastroesophageal reflux disease) 12/17/2012  . Hypertension 12/17/2012  . Thrombocytopenia, unspecified ([place]) 12/17/2012  . Postoperative anemia due to acute blood loss 12/15/2012  . Hyponatremia 12/15/2012  . OA (osteoarthritis) of knee 12/14/2012    [name], PT 10/21/2016, 2:07 PM  [place], [place], [address] Phone: [phone]   Fax:  [phone]  Name: Keith [name] MRN: [id number] Date of Birth: 1940/09/20

## 2016-10-23 ENCOUNTER — Encounter: Payer: Self-pay | Admitting: Physical Therapy

## 2016-10-23 ENCOUNTER — Ambulatory Visit: Payer: Medicare Other | Admitting: Physical Therapy

## 2016-10-23 DIAGNOSIS — M6281 Muscle weakness (generalized): Secondary | ICD-10-CM | POA: Diagnosis not present

## 2016-10-23 DIAGNOSIS — R296 Repeated falls: Secondary | ICD-10-CM

## 2016-10-23 DIAGNOSIS — R262 Difficulty in walking, not elsewhere classified: Secondary | ICD-10-CM

## 2016-10-23 NOTE — Therapy (Signed)
[place], [place], [address] Phone: [phone]   Fax:  [phone]  Physical Therapy Treatment  Patient Details  Name: Keith [name] MRN: [id number] Date of Birth: 1940/04/17 Referring Provider: Osborne Casco  Encounter Date: 10/23/2016      PT End of Session - 10/23/16 1558    Visit Number 7   Date for PT Re-Evaluation 12/02/16   PT Start Time 1350   PT Stop Time 1436   PT Time Calculation (min) 46 min   Activity Tolerance Patient tolerated treatment well   Behavior During Therapy [facility] for tasks assessed/performed      Past Medical History:  Diagnosis Date  . Cirrhosis of liver ([place])   . Diabetes mellitus   . Hepatic cancer ([place])   . Hypertension   . Prostate hypertrophy   . Stroke ([place])   . Varicose veins     Past Surgical History:  Procedure Laterality Date  . APPENDECTOMY    . EYE SURGERY     bilateral cataract   . left arm surgery     age 82  . left hand surgery     tendons ripped on left hand  . LIVER RESECTION    . TONSILLECTOMY    . TOTAL KNEE ARTHROPLASTY Left 12/14/2012   Procedure: LEFT TOTAL KNEE ARTHROPLASTY;  Surgeon: Gearlean Alf, MD;  [facility];  Service: Orthopedics;  Laterality: Left;    There were no vitals filed for this visit.      Subjective Assessment - 10/23/16 1401    Subjective Reports that he has been dizzy today, reports bending over and then standing up will increase the dizziness, no falls.   Currently in Pain? Yes   Pain Score 4    Pain Location Back   Pain Orientation Lower                         [facility] Adult PT Treatment/Exercise - 10/23/16 0001      Ambulation/Gait   Gait Comments HHA 180'x2, some use of the wall to stay steady     Lumbar Exercises: Stretches   Passive Hamstring Stretch 3 reps;20 seconds   Piriformis Stretch 4 reps;20 seconds   Piriformis Stretch Limitations seated     Lumbar  Exercises: Aerobic   Stationary Bike NuStep76 min, lvl 4   UBE (Upper Arm Bike) level 5 x 5 minutes     Lumbar Exercises: Machines for Strengthening   Other Lumbar Machine Exercise red tband ankle PF, DF 2x15 each, seated ankle sit/fit exercises   Other Lumbar Machine Exercise green tband scapular stabilization, green tband seated hip abduction, ball b/n knees squeeze     Lumbar Exercises: Standing   Other Standing Lumbar Exercises standing 2.5 # hip flexion, hip abduction     Lumbar Exercises: Seated   Long Arc Quad on Chair Strengthening;Right;Left;2 sets;10 reps;Weights   LAQ on Chair Weights (lbs) 5   LAQ on Chair Limitations Then we did marching with 2# while seated, also seated had foot propped on a 4" step and had him doing SLR's                  PT Short Term Goals - 10/02/16 1514      PT SHORT TERM GOAL #1   Title patient to be independent with initial HEP    Time 2   Period Weeks   Status Achieved  PT Long Term Goals - 10/23/16 1601      PT LONG TERM GOAL #2   Title Patient to report ability to walk 350 feet without rest or increase in low back pain    Status On-going     PT LONG TERM GOAL #4   Title be independent with gym exercises   Status On-going               Plan - 10/23/16 1559    Clinical Impression Statement Patient reports that he is a little more unsteady on his feet today, has reports of increased numbness in the legs,  has some increased pain in the thighs.  He really requires the HHA for ambulation due to the unsteadiness.  PLOF was independent ambulator      Patient will benefit from skilled therapeutic intervention in order to improve the following deficits and impairments:  Abnormal gait, Decreased activity tolerance, Decreased mobility, Decreased strength, Decreased endurance, Decreased range of motion, Difficulty walking, Impaired flexibility, Pain  Visit Diagnosis: Muscle weakness (generalized)  Difficulty  in walking, not elsewhere classified  Repeated falls     Problem List Patient Active Problem List   Diagnosis Date Noted  . Varicose veins of left lower extremity with complications 88/41/6606  . Knee pain, acute 01/26/2013  . Constipation 12/22/2012  . Neuropathy 12/22/2012  . Prostate hypertrophy 12/22/2012  . Acute blood loss anemia 12/22/2012  . Hyperlipidemia 12/22/2012  . Diabetes ([place]) 12/17/2012  . GERD (gastroesophageal reflux disease) 12/17/2012  . Hypertension 12/17/2012  . Thrombocytopenia, unspecified ([place]) 12/17/2012  . Postoperative anemia due to acute blood loss 12/15/2012  . Hyponatremia 12/15/2012  . OA (osteoarthritis) of knee 12/14/2012    [name], PT 10/23/2016, 4:02 PM  [place], [place], [address] Phone: [phone]   Fax:  [phone]  Name: Keith [name] MRN: [id number] Date of Birth: 08/01/1940

## 2016-10-24 ENCOUNTER — Ambulatory Visit: Payer: Medicare Other | Admitting: Physical Therapy

## 2016-10-25 DIAGNOSIS — E7800 Pure hypercholesterolemia, unspecified: Secondary | ICD-10-CM | POA: Diagnosis not present

## 2016-10-25 DIAGNOSIS — E11319 Type 2 diabetes mellitus with unspecified diabetic retinopathy without macular edema: Secondary | ICD-10-CM | POA: Diagnosis not present

## 2016-10-25 DIAGNOSIS — I442 Atrioventricular block, complete: Secondary | ICD-10-CM | POA: Diagnosis not present

## 2016-10-25 DIAGNOSIS — Z950 Presence of cardiac pacemaker: Secondary | ICD-10-CM | POA: Diagnosis not present

## 2016-10-28 ENCOUNTER — Encounter: Payer: Self-pay | Admitting: Physical Therapy

## 2016-10-28 ENCOUNTER — Ambulatory Visit: Payer: Medicare Other | Admitting: Physical Therapy

## 2016-10-28 DIAGNOSIS — G8929 Other chronic pain: Secondary | ICD-10-CM

## 2016-10-28 DIAGNOSIS — M6281 Muscle weakness (generalized): Secondary | ICD-10-CM | POA: Diagnosis not present

## 2016-10-28 DIAGNOSIS — R262 Difficulty in walking, not elsewhere classified: Secondary | ICD-10-CM

## 2016-10-28 DIAGNOSIS — R296 Repeated falls: Secondary | ICD-10-CM

## 2016-10-28 DIAGNOSIS — M545 Low back pain: Secondary | ICD-10-CM

## 2016-10-28 NOTE — Therapy (Signed)
[place], [place], [address] Phone: [phone]   Fax:  [phone]  Physical Therapy Treatment  Patient Details  Name: Keith [name] MRN: [id number] Date of Birth: 02/28/1941 Referring Provider: Osborne Casco  Encounter Date: 10/28/2016      PT End of Session - 10/28/16 1409    Visit Number 8   Date for PT Re-Evaluation 12/02/16   PT Start Time 1324   PT Stop Time 1410   PT Time Calculation (min) 46 min   Activity Tolerance Patient limited by pain   Behavior During Therapy [facility] for tasks assessed/performed      Past Medical History:  Diagnosis Date  . Cirrhosis of liver ([place])   . Diabetes mellitus   . Hepatic cancer ([place])   . Hypertension   . Prostate hypertrophy   . Stroke ([place])   . Varicose veins     Past Surgical History:  Procedure Laterality Date  . APPENDECTOMY    . EYE SURGERY     bilateral cataract   . left arm surgery     age 45  . left hand surgery     tendons ripped on left hand  . LIVER RESECTION    . TONSILLECTOMY    . TOTAL KNEE ARTHROPLASTY Left 12/14/2012   Procedure: LEFT TOTAL KNEE ARTHROPLASTY;  Surgeon: Gearlean Alf, MD;  [facility];  Service: Orthopedics;  Laterality: Left;    There were no vitals filed for this visit.      Subjective Assessment - 10/28/16 1333    Subjective Patient cancelled last week for a visit, his wife reports that he got out of the car and started walking toward the house and his right leg stopped and he could not pick it up, he did not fall to the ground but into the truck and stabilized himself.  They checked his pacemaker and blood suger, both were normal, his wife who is a nurse felt like it might have been orthostatic hypotension., no instances since that time   Currently in Pain? Yes   Pain Score 7    Pain Location Neck   Pain Orientation Right;Left   Aggravating Factors  reports that he has really been bracing  himself and holding onto things more                         [facility] Adult PT Treatment/Exercise - 10/28/16 0001      Ambulation/Gait   Gait Comments HHA 180'x2, some use of the wall to stay steady, increased unsteady gait today     Lumbar Exercises: Stretches   Piriformis Stretch 4 reps;20 seconds     Lumbar Exercises: Aerobic   Stationary Bike NuStep 8 min, lvl 4   UBE (Upper Arm Bike) level 5 x 6 minutes     Manual Therapy   Manual Therapy Soft tissue mobilization   Other Manual Therapy STM to the upper traps and rhomboids                  PT Short Term Goals - 10/02/16 1514      PT SHORT TERM GOAL #1   Title patient to be independent with initial HEP    Time 2   Period Weeks   Status Achieved           PT Long Term Goals - 10/23/16 1601      PT LONG TERM GOAL #  2   Title Patient to report ability to walk 350 feet without rest or increase in low back pain    Status On-going     PT LONG TERM GOAL #4   Title be independent with gym exercises   Status On-going               Plan - 10/28/16 1409    Clinical Impression Statement Patient had an episode of what his wife feels was orthostatic hypotension, she reports that he froze and was unable to walk after getting out of the car.  They saw the MD today, pacemaker and blood sugar were normal.  The MD really feels like PT and getting him moving may help, he was put on an 1800 calorie a day diet.  He is having much increased upper trap and rhomboid pain, possibly due to him being very tense with his gait, gaurding   PT Next Visit Plan see if we can add exercises if tolerated.  Will need to be careful with his balance   Consulted and Agree with Plan of Care Patient      Patient will benefit from skilled therapeutic intervention in order to improve the following deficits and impairments:  Abnormal gait, Decreased activity tolerance, Decreased mobility, Decreased strength, Decreased  endurance, Decreased range of motion, Difficulty walking, Impaired flexibility, Pain  Visit Diagnosis: Muscle weakness (generalized)  Difficulty in walking, not elsewhere classified  Repeated falls  Chronic low back pain, unspecified back pain laterality, with sciatica presence unspecified     Problem List Patient Active Problem List   Diagnosis Date Noted  . Varicose veins of left lower extremity with complications 43/14/2767  . Knee pain, acute 01/26/2013  . Constipation 12/22/2012  . Neuropathy 12/22/2012  . Prostate hypertrophy 12/22/2012  . Acute blood loss anemia 12/22/2012  . Hyperlipidemia 12/22/2012  . Diabetes ([place]) 12/17/2012  . GERD (gastroesophageal reflux disease) 12/17/2012  . Hypertension 12/17/2012  . Thrombocytopenia, unspecified ([place]) 12/17/2012  . Postoperative anemia due to acute blood loss 12/15/2012  . Hyponatremia 12/15/2012  . OA (osteoarthritis) of knee 12/14/2012    [name], PT 10/28/2016, 2:13 PM  [place], [place], [address] Phone: [phone]   Fax:  [phone]  Name: Keith [name] MRN: [id number] Date of Birth: 09/14/1940

## 2016-10-30 ENCOUNTER — Encounter: Payer: Self-pay | Admitting: Physical Therapy

## 2016-10-30 ENCOUNTER — Ambulatory Visit: Payer: Medicare Other | Admitting: Physical Therapy

## 2016-10-30 DIAGNOSIS — R296 Repeated falls: Secondary | ICD-10-CM

## 2016-10-30 DIAGNOSIS — R262 Difficulty in walking, not elsewhere classified: Secondary | ICD-10-CM

## 2016-10-30 DIAGNOSIS — G8929 Other chronic pain: Secondary | ICD-10-CM

## 2016-10-30 DIAGNOSIS — M545 Low back pain: Secondary | ICD-10-CM

## 2016-10-30 DIAGNOSIS — M6281 Muscle weakness (generalized): Secondary | ICD-10-CM | POA: Diagnosis not present

## 2016-10-30 NOTE — Therapy (Signed)
[place], [place], [address] Phone: [phone]   Fax:  [phone]  Physical Therapy Treatment  Patient Details  Name: Keith [name] MRN: [id number] Date of Birth: September 14, 1940 Referring Provider: Osborne Casco  Encounter Date: 10/30/2016      PT End of Session - 10/30/16 1605    Visit Number 9   Date for PT Re-Evaluation 12/02/16   PT Start Time 1443  decreased treatment time, patient with increased pain   PT Stop Time 1440   PT Time Calculation (min) 50 min   Activity Tolerance Patient limited by pain   Behavior During Therapy [facility] for tasks assessed/performed      Past Medical History:  Diagnosis Date  . Cirrhosis of liver ([place])   . Diabetes mellitus   . Hepatic cancer ([place])   . Hypertension   . Prostate hypertrophy   . Stroke ([place])   . Varicose veins     Past Surgical History:  Procedure Laterality Date  . APPENDECTOMY    . EYE SURGERY     bilateral cataract   . left arm surgery     age 76  . left hand surgery     tendons ripped on left hand  . LIVER RESECTION    . TONSILLECTOMY    . TOTAL KNEE ARTHROPLASTY Left 12/14/2012   Procedure: LEFT TOTAL KNEE ARTHROPLASTY;  Surgeon: Gearlean Alf, MD;  [facility];  Service: Orthopedics;  Laterality: Left;    There were no vitals filed for this visit.      Subjective Assessment - 10/30/16 1430    Subjective Patient was in the dentist chair for "3 hours this morning", he reports stiffness and pain and spasms in the right upper trap, the neck and shoulder area.   Currently in Pain? Yes   Pain Score 9    Pain Location Neck   Pain Orientation Right   Aggravating Factors  c/o pain in the right upper trap, rhomboid and neck area, he thinks it could have been made worse with the dentist but I also think he is using th earm a lot to brace himself due to poor balance                         [facility] Adult PT  Treatment/Exercise - 10/30/16 0001      Moist Heat Therapy   Number Minutes Moist Heat 15 Minutes   Moist Heat Location Shoulder;Cervical     Manual Therapy   Manual Therapy Soft tissue mobilization   Other Manual Therapy STM to the upper traps and rhomboids                  PT Short Term Goals - 10/02/16 1514      PT SHORT TERM GOAL #1   Title patient to be independent with initial HEP    Time 2   Period Weeks   Status Achieved           PT Long Term Goals - 10/30/16 1608      PT LONG TERM GOAL #1   Title Patient to improve TUG score to 20 seconds    Status On-going     PT LONG TERM GOAL #2   Title Patient to report ability to walk 350 feet without rest or increase in low back pain    Status On-going     PT LONG TERM GOAL #3  Title increase LE strength to 4+/5   Status On-going     PT LONG TERM GOAL #4   Title be independent with gym exercises   Status On-going               Plan - 10/30/16 1606    Clinical Impression Statement Patient with increased right neck and shoulder pain.  He has a lot of tension and spasms in this area.  Very tender with trigger points   PT Next Visit Plan see if we can add exercises if tolerated.  Will need to be careful with his balance   Consulted and Agree with Plan of Care Patient      Patient will benefit from skilled therapeutic intervention in order to improve the following deficits and impairments:  Abnormal gait, Decreased activity tolerance, Decreased mobility, Decreased strength, Decreased endurance, Decreased range of motion, Difficulty walking, Impaired flexibility, Pain  Visit Diagnosis: Muscle weakness (generalized)  Difficulty in walking, not elsewhere classified  Repeated falls  Chronic low back pain, unspecified back pain laterality, with sciatica presence unspecified     Problem List Patient Active Problem List   Diagnosis Date Noted  . Varicose veins of left lower extremity with  complications 81/82/9937  . Knee pain, acute 01/26/2013  . Constipation 12/22/2012  . Neuropathy 12/22/2012  . Prostate hypertrophy 12/22/2012  . Acute blood loss anemia 12/22/2012  . Hyperlipidemia 12/22/2012  . Diabetes ([place]) 12/17/2012  . GERD (gastroesophageal reflux disease) 12/17/2012  . Hypertension 12/17/2012  . Thrombocytopenia, unspecified ([place]) 12/17/2012  . Postoperative anemia due to acute blood loss 12/15/2012  . Hyponatremia 12/15/2012  . OA (osteoarthritis) of knee 12/14/2012    [name], PT 10/30/2016, 4:09 PM  [place], [place], [address] Phone: [phone]   Fax:  [phone]  Name: ANSEL FERRALL MRN: [id number] Date of Birth: [birth year]

## 2016-11-01 ENCOUNTER — Ambulatory Visit: Payer: Medicare Other | Admitting: Physical Therapy

## 2016-11-04 ENCOUNTER — Ambulatory Visit: Payer: Medicare Other | Admitting: Physical Therapy

## 2016-11-04 ENCOUNTER — Encounter: Payer: Self-pay | Admitting: Physical Therapy

## 2016-11-04 DIAGNOSIS — G8929 Other chronic pain: Secondary | ICD-10-CM

## 2016-11-04 DIAGNOSIS — M6281 Muscle weakness (generalized): Secondary | ICD-10-CM | POA: Diagnosis not present

## 2016-11-04 DIAGNOSIS — R296 Repeated falls: Secondary | ICD-10-CM

## 2016-11-04 DIAGNOSIS — M545 Low back pain: Secondary | ICD-10-CM

## 2016-11-04 DIAGNOSIS — R262 Difficulty in walking, not elsewhere classified: Secondary | ICD-10-CM

## 2016-11-04 NOTE — Therapy (Signed)
[place], [place], [address] Phone: [phone]   Fax:  [phone]  Physical Therapy Treatment  Patient Details  Name: Keith [name] MRN: [id number] Date of Birth: 24-Jun-1940 Referring Provider: Osborne Casco  Encounter Date: 11/04/2016      PT End of Session - 11/04/16 1551    Visit Number 10   Date for PT Re-Evaluation 12/02/16   PT Start Time 1400   PT Stop Time 1445   PT Time Calculation (min) 45 min   Activity Tolerance Patient limited by pain   Behavior During Therapy [facility] for tasks assessed/performed      Past Medical History:  Diagnosis Date  . Cirrhosis of liver ([place])   . Diabetes mellitus   . Hepatic cancer ([place])   . Hypertension   . Prostate hypertrophy   . Stroke ([place])   . Varicose veins     Past Surgical History:  Procedure Laterality Date  . APPENDECTOMY    . EYE SURGERY     bilateral cataract   . left arm surgery     age 31  . left hand surgery     tendons ripped on left hand  . LIVER RESECTION    . TONSILLECTOMY    . TOTAL KNEE ARTHROPLASTY Left 12/14/2012   Procedure: LEFT TOTAL KNEE ARTHROPLASTY;  Surgeon: Gearlean Alf, MD;  Location: [facility];  Service: Orthopedics;  Laterality: Left;    There were no vitals filed for this visit.      Subjective Assessment - 11/04/16 1450    Subjective Patient reports that he went to a party over the weekend, reports that he is very tired and leg is very sore   Currently in Pain? Yes   Pain Score 8    Pain Location Leg   Pain Orientation Left;Upper                         [facility] Adult PT Treatment/Exercise - 11/04/16 0001      Lumbar Exercises: Aerobic   Stationary Bike NuStep 8 min, lvl 4   UBE (Upper Arm Bike) level 5 x 6 minutes     Lumbar Exercises: Machines for Strengthening   Other Lumbar Machine Exercise red tband ankle PF, DF 2x15 each, seated ankle sit/fit exercises   Other Lumbar  Machine Exercise green tband scapular stabilization, green tband seated hip abduction, ball b/n knees squeeze     Lumbar Exercises: Seated   Long Arc Quad on Chair Strengthening;Right;Left;2 sets;10 reps;Weights   LAQ on Chair Weights (lbs) 2   LAQ on Chair Limitations Then we did marching with 2# while seated, also seated had foot propped on a 4" step and had him doing SLR's     Lumbar Exercises: Supine   Other Supine Lumbar Exercises 2.5 # hip flexion while seated     Manual Therapy   Manual Therapy Soft tissue mobilization   Other Manual Therapy STM to the upper traps and rhomboids and left thigh                    PT Short Term Goals - 10/02/16 1514      PT SHORT TERM GOAL #1   Title patient to be independent with initial HEP    Time 2   Period Weeks   Status Achieved           PT Long Term Goals - 10/30/16  [place] #1   Title Patient to improve TUG score to 20 seconds    Status On-going     PT LONG TERM GOAL #2   Title Patient to report ability to walk 350 feet without rest or increase in low back pain    Status On-going     PT LONG TERM GOAL #3   Title increase LE strength to 4+/5   Status On-going     PT LONG TERM GOAL #4   Title be independent with gym exercises   Status On-going               Plan - 11/30/2016 1553    Clinical Impression Statement Patient has had some ups and downs over the past 10 visits, he had some increased dizziness today, some increased pain after being at a party this weekend.  He is having increased difficulty walking, unsteady on his feet, he seems to be having a lot more balance issues and he is really gaurding with all of his other mms to help with balance I think this is why he is having the increased pains,   PT Next Visit Plan Try to work on some balance   Consulted and Agree with Plan of Care Patient      Patient will benefit from skilled therapeutic intervention in order to improve the  following deficits and impairments:  Abnormal gait, Decreased activity tolerance, Decreased mobility, Decreased strength, Decreased endurance, Decreased range of motion, Difficulty walking, Impaired flexibility, Pain  Visit Diagnosis: Muscle weakness (generalized)  Difficulty in walking, not elsewhere classified  Repeated falls  Chronic low back pain, unspecified back pain laterality, with sciatica presence unspecified       G-Codes - 30-Nov-2016 1551    Functional Assessment Tool Used (Outpatient Only) foto 76% limited   Functional Limitation Mobility: Walking and moving around   Mobility: Walking and Moving Around Current Status [phone]) At least 60 percent but less than 80 percent impaired, limited or restricted   Mobility: Walking and Moving Around Goal Status [phone]) At least 40 percent but less than 60 percent impaired, limited or restricted      Problem List Patient Active Problem List   Diagnosis Date Noted  . Varicose veins of left lower extremity with complications 90/38/3338  . Knee pain, acute 01/26/2013  . Constipation 12/22/2012  . Neuropathy 12/22/2012  . Prostate hypertrophy 12/22/2012  . Acute blood loss anemia 12/22/2012  . Hyperlipidemia 12/22/2012  . Diabetes ([place]) 12/17/2012  . GERD (gastroesophageal reflux disease) 12/17/2012  . Hypertension 12/17/2012  . Thrombocytopenia, unspecified ([place]) 12/17/2012  . Postoperative anemia due to acute blood loss 12/15/2012  . Hyponatremia 12/15/2012  . OA (osteoarthritis) of knee 12/14/2012    [name], PT 2016/11/30, 3:58 PM  [place] [address] [place], [place], [address] Phone: [phone]   Fax:  [phone]  Name: TYLYN DERWIN MRN: [id number] Date of Birth: 04-24-1940

## 2016-11-06 ENCOUNTER — Encounter: Payer: Self-pay | Admitting: Physical Therapy

## 2016-11-06 ENCOUNTER — Ambulatory Visit: Payer: Medicare Other | Attending: Internal Medicine | Admitting: Physical Therapy

## 2016-11-06 DIAGNOSIS — G8929 Other chronic pain: Secondary | ICD-10-CM | POA: Insufficient documentation

## 2016-11-06 DIAGNOSIS — R262 Difficulty in walking, not elsewhere classified: Secondary | ICD-10-CM | POA: Diagnosis not present

## 2016-11-06 DIAGNOSIS — M545 Low back pain: Secondary | ICD-10-CM | POA: Diagnosis not present

## 2016-11-06 DIAGNOSIS — R296 Repeated falls: Secondary | ICD-10-CM | POA: Insufficient documentation

## 2016-11-06 DIAGNOSIS — M6281 Muscle weakness (generalized): Secondary | ICD-10-CM | POA: Diagnosis not present

## 2016-11-06 DIAGNOSIS — M25651 Stiffness of right hip, not elsewhere classified: Secondary | ICD-10-CM | POA: Diagnosis not present

## 2016-11-06 DIAGNOSIS — M25652 Stiffness of left hip, not elsewhere classified: Secondary | ICD-10-CM | POA: Insufficient documentation

## 2016-11-06 NOTE — Therapy (Signed)
[place], [place], [address] Phone: [phone]   Fax:  [phone]  Physical Therapy Treatment  Patient Details  Name: Keith [name] MRN: [id number] Date of Birth: Feb 24, 1941 Referring Provider: Osborne Casco  Encounter Date: 11/06/2016      PT End of Session - 11/06/16 1450    Visit Number 11   Date for PT Re-Evaluation 12/02/16   PT Start Time 4680   PT Stop Time 1448   PT Time Calculation (min) 51 min   Activity Tolerance Patient limited by pain   Behavior During Therapy [facility] for tasks assessed/performed      Past Medical History:  Diagnosis Date  . Cirrhosis of liver ([place])   . Diabetes mellitus   . Hepatic cancer ([place])   . Hypertension   . Prostate hypertrophy   . Stroke ([place])   . Varicose veins     Past Surgical History:  Procedure Laterality Date  . APPENDECTOMY    . EYE SURGERY     bilateral cataract   . left arm surgery     age 42  . left hand surgery     tendons ripped on left hand  . LIVER RESECTION    . TONSILLECTOMY    . TOTAL KNEE ARTHROPLASTY Left 12/14/2012   Procedure: LEFT TOTAL KNEE ARTHROPLASTY;  Surgeon: Gearlean Alf, MD;  Location[facility];  Service: Orthopedics;  Laterality: Left;    There were no vitals filed for this visit.      Subjective Assessment - 11/06/16 1359    Subjective Patient reports that he was at the dentist for a long time yesterday and that he had a bad day after that.  Reports dizziness and weakness   Currently in Pain? Yes   Pain Score 8    Pain Location Leg   Pain Orientation Left;Upper   Aggravating Factors  he is unsure of what causes the pain    Pain Relieving Factors reports Tramadol helps some                         [facility] Adult PT Treatment/Exercise - 11/06/16 0001      Ambulation/Gait   Gait Comments HHA 180" x 2 occasional need for Mod A due to loss of balance     Lumbar Exercises: Aerobic   Stationary Bike NuStep 8 min, lvl 4   UBE (Upper Arm Bike) level 5 x 6 minutes     Lumbar Exercises: Machines for Strengthening   Other Lumbar Machine Exercise red tband ankle PF, DF 2x15 each, seated ankle sit/fit exercises   Other Lumbar Machine Exercise green tband scapular stabilization, green tband seated hip abduction, ball b/n knees squeeze     Lumbar Exercises: Seated   Long Arc Quad on Chair Strengthening;Right;Left;2 sets;10 reps;Weights   LAQ on Chair Weights (lbs) 2   LAQ on Chair Limitations Then we did marching with 2# while seated, also seated had foot propped on a 4" step and had him doing SLR's     Knee/Hip Exercises: Standing   Hip Flexion Both;2 sets;10 reps   Hip Flexion Limitations 2#     Manual Therapy   Manual Therapy Soft tissue mobilization   Other Manual Therapy STM to the upper traps and rhomboids and left thigh                    PT Short Term Goals -  10/02/16 1514      PT SHORT TERM GOAL #1   Title patient to be independent with initial HEP    Time 2   Period Weeks   Status Achieved           PT Long Term Goals - 11/06/16 1453      PT LONG TERM GOAL #1   Title Patient to improve TUG score to 20 seconds    Status On-going     PT LONG TERM GOAL #4   Title be independent with gym exercises   Status On-going               Plan - 11/06/16 1450    Clinical Impression Statement Patient has had increased pain and dizziness recently, he reports that he is having a lot of dental work.  Difficulty with balance today.  He was having some increased left quad pain   PT Next Visit Plan If doing better will do some balance work   Oncologist with Plan of Care Patient      Patient will benefit from skilled therapeutic intervention in order to improve the following deficits and impairments:  Abnormal gait, Decreased activity tolerance, Decreased mobility, Decreased strength, Decreased endurance, Decreased range of motion,  Difficulty walking, Impaired flexibility, Pain  Visit Diagnosis: Muscle weakness (generalized)  Difficulty in walking, not elsewhere classified  Repeated falls  Chronic low back pain, unspecified back pain laterality, with sciatica presence unspecified     Problem List Patient Active Problem List   Diagnosis Date Noted  . Varicose veins of left lower extremity with complications 09/38/1829  . Knee pain, acute 01/26/2013  . Constipation 12/22/2012  . Neuropathy 12/22/2012  . Prostate hypertrophy 12/22/2012  . Acute blood loss anemia 12/22/2012  . Hyperlipidemia 12/22/2012  . Diabetes ([place]) 12/17/2012  . GERD (gastroesophageal reflux disease) 12/17/2012  . Hypertension 12/17/2012  . Thrombocytopenia, unspecified ([place]) 12/17/2012  . Postoperative anemia due to acute blood loss 12/15/2012  . Hyponatremia 12/15/2012  . OA (osteoarthritis) of knee 12/14/2012    [name], PT 11/06/2016, 2:54 PM  [place], [place], [address] Phone: [phone]   Fax:  [phone]  Name: Keith [name] MRN: [id number] Date of Birth: 06/22/1940

## 2016-11-08 ENCOUNTER — Ambulatory Visit: Payer: Medicare Other | Admitting: Physical Therapy

## 2016-11-11 ENCOUNTER — Ambulatory Visit: Payer: Medicare Other | Admitting: Physical Therapy

## 2016-11-13 ENCOUNTER — Ambulatory Visit: Payer: Medicare Other | Admitting: Physical Therapy

## 2016-11-18 DIAGNOSIS — K766 Portal hypertension: Secondary | ICD-10-CM | POA: Diagnosis not present

## 2016-11-18 DIAGNOSIS — C220 Liver cell carcinoma: Secondary | ICD-10-CM | POA: Diagnosis not present

## 2016-11-18 DIAGNOSIS — K760 Fatty (change of) liver, not elsewhere classified: Secondary | ICD-10-CM | POA: Diagnosis not present

## 2016-11-18 DIAGNOSIS — I8500 Esophageal varices without bleeding: Secondary | ICD-10-CM | POA: Diagnosis not present

## 2016-11-18 DIAGNOSIS — D3501 Benign neoplasm of right adrenal gland: Secondary | ICD-10-CM | POA: Diagnosis not present

## 2016-11-19 ENCOUNTER — Ambulatory Visit: Payer: Medicare Other | Admitting: Physical Therapy

## 2016-11-19 ENCOUNTER — Encounter: Payer: Self-pay | Admitting: Physical Therapy

## 2016-11-19 DIAGNOSIS — M6281 Muscle weakness (generalized): Secondary | ICD-10-CM | POA: Diagnosis not present

## 2016-11-19 DIAGNOSIS — R296 Repeated falls: Secondary | ICD-10-CM

## 2016-11-19 DIAGNOSIS — R262 Difficulty in walking, not elsewhere classified: Secondary | ICD-10-CM

## 2016-11-19 NOTE — Therapy (Signed)
[place], [place], [address] Phone: [phone]   Fax:  [phone]  Physical Therapy Treatment  Patient Details  Name: Keith [name] MRN: [id number] Date of Birth: [birth year] Referring Provider: Osborne Casco  Encounter Date: 11/19/2016      PT End of Session - 11/19/16 1628    Visit Number 12   Date for PT Re-Evaluation 12/02/16   PT Start Time 1601   PT Stop Time 1623   PT Time Calculation (min) 48 min   Activity Tolerance Patient limited by pain   Behavior During Therapy [facility] for tasks assessed/performed      Past Medical History:  Diagnosis Date  . Cirrhosis of liver ([place])   . Diabetes mellitus   . Hepatic cancer ([place])   . Hypertension   . Prostate hypertrophy   . Stroke ([place])   . Varicose veins     Past Surgical History:  Procedure Laterality Date  . APPENDECTOMY    . EYE SURGERY     bilateral cataract   . left arm surgery     age 42  . left hand surgery     tendons ripped on left hand  . LIVER RESECTION    . TONSILLECTOMY    . TOTAL KNEE ARTHROPLASTY Left 12/14/2012   Procedure: LEFT TOTAL KNEE ARTHROPLASTY;  Surgeon: Gearlean Alf, MD;  Location[facility];  Service: Orthopedics;  Laterality: Left;    There were no vitals filed for this visit.      Subjective Assessment - 11/19/16 1546    Subjective Patient comes in today with shoes on, he has been wearing a custom made mocasin, he reports that he has a blister on the left heel, he reports that he is seeing a podaiatrist for the past 3 weeks to get it better, he is having much more difficulty walking, needed W/C to get in today   Currently in Pain? Yes   Pain Score 8    Pain Location Heel   Pain Orientation Left   Aggravating Factors  walking really hurts the heel                         [facility] Adult PT Treatment/Exercise - 11/19/16 0001      Ambulation/Gait   Gait Comments HHA, 3 x 100 ', very  poor gait due to pain in the left heel from the blister     Lumbar Exercises: Aerobic   Stationary Bike NuStep 9 min, lvl 4   UBE (Upper Arm Bike) level 5 x 7 minutes     Lumbar Exercises: Machines for Strengthening   Other Lumbar Machine Exercise red tband ankle PF, DF 2x15 each, seated ankle sit/fit exercises   Other Lumbar Machine Exercise green tband scapular stabilization, green tband seated hip abduction, ball b/n knees squeeze     Lumbar Exercises: Seated   Long Arc Quad on Chair Strengthening;Right;Left;2 sets;10 reps;Weights   LAQ on Chair Weights (lbs) 2   LAQ on Chair Limitations Then we did marching with 2# while seated, also seated had foot propped on a 4" step and had him doing [employer]                  PT Short Term Goals - 10/02/16 1514      PT SHORT TERM GOAL #1   Title patient to be independent with initial HEP    Time 2  Period Weeks   Status Achieved           PT Long Term Goals - 11/19/16 1630      PT LONG TERM GOAL #1   Title Patient to improve TUG score to 20 seconds    Status On-going     PT LONG TERM GOAL #2   Title Patient to report ability to walk 350 feet without rest or increase in low back pain    Status On-going               Plan - 11/19/16 1629    Clinical Impression Statement Patient having more difficulty walking today due to c/o a blister on his left heel, he was much more unsteady and required frequent breaks with walking to the point he asked to use the w/c for some of the distances.   PT Next Visit Plan if his heel is doing better will do more activities   Consulted and Agree with Plan of Care Patient      Patient will benefit from skilled therapeutic intervention in order to improve the following deficits and impairments:  Abnormal gait, Decreased activity tolerance, Decreased mobility, Decreased strength, Decreased endurance, Decreased range of motion, Difficulty walking, Impaired flexibility, Pain  Visit  Diagnosis: Muscle weakness (generalized)  Difficulty in walking, not elsewhere classified  Repeated falls     Problem List Patient Active Problem List   Diagnosis Date Noted  . Varicose veins of left lower extremity with complications 33/29/5188  . Knee pain, acute 01/26/2013  . Constipation 12/22/2012  . Neuropathy 12/22/2012  . Prostate hypertrophy 12/22/2012  . Acute blood loss anemia 12/22/2012  . Hyperlipidemia 12/22/2012  . Diabetes ([place]) 12/17/2012  . GERD (gastroesophageal reflux disease) 12/17/2012  . Hypertension 12/17/2012  . Thrombocytopenia, unspecified ([place]) 12/17/2012  . Postoperative anemia due to acute blood loss 12/15/2012  . Hyponatremia 12/15/2012  . OA (osteoarthritis) of knee 12/14/2012    [name], PT 11/19/2016, 4:31 PM  [place] [address] [place], [place], [address] Phone: [phone]   Fax:  [phone]  Name: Keith [name] MRN: [id number] Date of Birth: 04-Nov-1940

## 2016-11-27 ENCOUNTER — Ambulatory Visit: Payer: Medicare Other | Admitting: Physical Therapy

## 2016-11-27 ENCOUNTER — Encounter: Payer: Self-pay | Admitting: Physical Therapy

## 2016-11-27 DIAGNOSIS — M545 Low back pain: Secondary | ICD-10-CM

## 2016-11-27 DIAGNOSIS — R296 Repeated falls: Secondary | ICD-10-CM

## 2016-11-27 DIAGNOSIS — M6281 Muscle weakness (generalized): Secondary | ICD-10-CM | POA: Diagnosis not present

## 2016-11-27 DIAGNOSIS — G8929 Other chronic pain: Secondary | ICD-10-CM

## 2016-11-27 DIAGNOSIS — R262 Difficulty in walking, not elsewhere classified: Secondary | ICD-10-CM

## 2016-11-27 NOTE — Therapy (Signed)
[place], [place], [address] Phone: [phone]   Fax:  [phone]  Physical Therapy Treatment  Patient Details  Name: Keith [name] MRN: [id number] Date of Birth: [birth year] Referring Provider: Osborne Casco  Encounter Date: 11/27/2016      PT End of Session - 11/27/16 1533    Visit Number 13   Date for PT Re-Evaluation 12/02/16   PT Start Time 5465   PT Stop Time 1529   PT Time Calculation (min) 50 min   Activity Tolerance Patient limited by pain   Behavior During Therapy [facility] for tasks assessed/performed      Past Medical History:  Diagnosis Date  . Cirrhosis of liver ([place])   . Diabetes mellitus   . Hepatic cancer ([place])   . Hypertension   . Prostate hypertrophy   . Stroke ([place])   . Varicose veins     Past Surgical History:  Procedure Laterality Date  . APPENDECTOMY    . EYE SURGERY     bilateral cataract   . left arm surgery     age 52  . left hand surgery     tendons ripped on left hand  . LIVER RESECTION    . TONSILLECTOMY    . TOTAL KNEE ARTHROPLASTY Left 12/14/2012   Procedure: LEFT TOTAL KNEE ARTHROPLASTY;  Surgeon: Gearlean Alf, MD;  [facility];  Service: Orthopedics;  Laterality: Left;    There were no vitals filed for this visit.      Subjective Assessment - 11/27/16 1443    Subjective Patient reports that he had one of the best days he has had in a few months, he is unsure of why, he reports that today when he woke up he was back again iwth difficulty walking and balance.   Currently in Pain? Yes   Pain Score 6    Pain Location Back   Pain Orientation Lower   Pain Descriptors / Indicators Sore   Pain Type Chronic pain                         [facility] Adult PT Treatment/Exercise - 11/27/16 0001      Lumbar Exercises: Aerobic   Stationary Bike NuStep 9 min, lvl 4   UBE (Upper Arm Bike) level 5 x 7 minutes     Lumbar Exercises:  Machines for Strengthening   Other Lumbar Machine Exercise red tband ankle PF, DF 2x15 each, seated ankle sit/fit exercises   Other Lumbar Machine Exercise green tband scapular stabilization, green tband seated hip abduction, ball b/n knees squeeze     Lumbar Exercises: Seated   Long Arc Quad on Chair Strengthening;Right;Left;2 sets;10 reps;Weights   LAQ on Chair Weights (lbs) 2   LAQ on Chair Limitations Then we did marching with 2# while seated, also seated had foot propped on a 4" step and had him doing SLR's     Manual Therapy   Manual Therapy Soft tissue mobilization   Other Manual Therapy STM to the upper traps and rhomboids and left thigh                    PT Short Term Goals - 10/02/16 1514      PT SHORT TERM GOAL #1   Title patient to be independent with initial HEP    Time 2   Period Weeks   Status Achieved  PT Long Term Goals - 11/27/16 1539      PT LONG TERM GOAL #1   Title Patient to improve TUG score to 20 seconds    Status On-going     PT LONG TERM GOAL #2   Title Patient to report ability to walk 350 feet without rest or increase in low back pain    Status On-going               Plan - 11/27/16 1533    Clinical Impression Statement Patient reports doing better today, feeling better after our treatment.  He reports that the legs are feeling a little better.   PT Next Visit Plan we will continue to work on his strength and function   Consulted and Agree with Plan of Care Patient      Patient will benefit from skilled therapeutic intervention in order to improve the following deficits and impairments:  Abnormal gait, Decreased activity tolerance, Decreased mobility, Decreased strength, Decreased endurance, Decreased range of motion, Difficulty walking, Impaired flexibility, Pain  Visit Diagnosis: Muscle weakness (generalized)  Difficulty in walking, not elsewhere classified  Repeated falls  Chronic low back pain,  unspecified back pain laterality, with sciatica presence unspecified     Problem List Patient Active Problem List   Diagnosis Date Noted  . Varicose veins of left lower extremity with complications 15/94/7076  . Knee pain, acute 01/26/2013  . Constipation 12/22/2012  . Neuropathy 12/22/2012  . Prostate hypertrophy 12/22/2012  . Acute blood loss anemia 12/22/2012  . Hyperlipidemia 12/22/2012  . Diabetes ([place]) 12/17/2012  . GERD (gastroesophageal reflux disease) 12/17/2012  . Hypertension 12/17/2012  . Thrombocytopenia, unspecified ([place]) 12/17/2012  . Postoperative anemia due to acute blood loss 12/15/2012  . Hyponatremia 12/15/2012  . OA (osteoarthritis) of knee 12/14/2012    [name], PT 11/27/2016, 3:40 PM  [place], [place], [address] Phone: [phone]   Fax:  [phone]  Name: Keith [name] MRN: [id number] Date of Birth: March 22, 1941

## 2016-11-28 DIAGNOSIS — Z950 Presence of cardiac pacemaker: Secondary | ICD-10-CM | POA: Diagnosis not present

## 2016-11-28 DIAGNOSIS — R001 Bradycardia, unspecified: Secondary | ICD-10-CM | POA: Diagnosis not present

## 2016-12-03 ENCOUNTER — Encounter: Payer: Self-pay | Admitting: Physical Therapy

## 2016-12-03 ENCOUNTER — Ambulatory Visit: Payer: Medicare Other | Admitting: Physical Therapy

## 2016-12-03 DIAGNOSIS — R296 Repeated falls: Secondary | ICD-10-CM | POA: Diagnosis not present

## 2016-12-03 DIAGNOSIS — M6281 Muscle weakness (generalized): Secondary | ICD-10-CM | POA: Diagnosis not present

## 2016-12-03 DIAGNOSIS — M545 Low back pain: Secondary | ICD-10-CM | POA: Diagnosis not present

## 2016-12-03 DIAGNOSIS — M25651 Stiffness of right hip, not elsewhere classified: Secondary | ICD-10-CM | POA: Diagnosis not present

## 2016-12-03 DIAGNOSIS — R262 Difficulty in walking, not elsewhere classified: Secondary | ICD-10-CM | POA: Diagnosis not present

## 2016-12-03 DIAGNOSIS — M25652 Stiffness of left hip, not elsewhere classified: Secondary | ICD-10-CM

## 2016-12-03 DIAGNOSIS — G8929 Other chronic pain: Secondary | ICD-10-CM

## 2016-12-03 NOTE — Therapy (Signed)
[place], [place], [address] Phone: [phone]   Fax:  [phone]  Physical Therapy Treatment  Patient Details  Name: Keith [name] MRN: [id number] Date of Birth: 05-Nov-1940 Referring Provider: Osborne Casco  Encounter Date: 12/03/2016      PT End of Session - 12/03/16 1608    Visit Number 14   Date for PT Re-Evaluation 01/02/17   PT Start Time 1450   PT Stop Time 1530   PT Time Calculation (min) 40 min   Activity Tolerance Patient limited by pain   Behavior During Therapy [facility] for tasks assessed/performed      Past Medical History:  Diagnosis Date  . Cirrhosis of liver ([place])   . Diabetes mellitus   . Hepatic cancer ([place])   . Hypertension   . Prostate hypertrophy   . Stroke ([place])   . Varicose veins     Past Surgical History:  Procedure Laterality Date  . APPENDECTOMY    . EYE SURGERY     bilateral cataract   . left arm surgery     age 24  . left hand surgery     tendons ripped on left hand  . LIVER RESECTION    . TONSILLECTOMY    . TOTAL KNEE ARTHROPLASTY Left 12/14/2012   Procedure: LEFT TOTAL KNEE ARTHROPLASTY;  Surgeon: Gearlean Alf, MD;  [facility];  Service: Orthopedics;  Laterality: Left;    There were no vitals filed for this visit.      Subjective Assessment - 12/03/16 1605    Subjective Patient continues to have LE pain and tightness, he reports walking really good for two days after our last treatment   Currently in Pain? Yes   Pain Score 8    Pain Location Leg   Pain Orientation Left;Right;Upper;Lower   Pain Descriptors / Indicators Tightness;Sore   Aggravating Factors  walking   Pain Relieving Factors the massage helps some            [facility] PT Assessment - 12/03/16 0001      Timed Up and Go Test   Normal TUG (seconds) 27                     [facility] Adult PT Treatment/Exercise - 12/03/16 0001      Ambulation/Gait   Gait  Comments HHA 180'x2 much better today less unsteadiness used the wall less for balance     Lumbar Exercises: Machines for Strengthening   Other Lumbar Machine Exercise red tband ankle PF, DF 2x15 each, seated ankle sit/fit exercises   Other Lumbar Machine Exercise green tband scapular stabilization, green tband seated hip abduction, ball b/n knees squeeze     Knee/Hip Exercises: Standing   Hip Flexion Both;2 sets;10 reps   Hip Abduction Both;2 sets;10 reps     Manual Therapy   Manual Therapy Soft tissue mobilization   Manual therapy comments STM of the quads and ITB and into the calves                  PT Short Term Goals - 10/02/16 1514      PT SHORT TERM GOAL #1   Title patient to be independent with initial HEP    Time 2   Period Weeks   Status Achieved           PT Long Term Goals - 12/03/16 1612      PT LONG TERM  GOAL #1   Title Patient to improve TUG score to 20 seconds    Status On-going     PT LONG TERM GOAL #2   Title Patient to report ability to walk 350 feet without rest or increase in low back pain    Status Partially Met     PT LONG TERM GOAL #3   Title increase LE strength to 4+/5   Status Partially Met     PT LONG TERM GOAL #4   Title be independent with gym exercises   Status On-going               Plan - 12/03/16 1610    Clinical Impression Statement Patient reports having a few good days after our last treatment, he comes in today reporting increased LE pain, he has always had tightness and pain in the quads and ITB's, has some in the calves.  He is a little steadier today, he does report some issues with his eyesight that has held him back.   PT Frequency 2x / week   PT Duration 4 weeks   PT Treatment/Interventions ADLs/Self Care Home Management;Cryotherapy;Presenter, broadcasting;Therapeutic exercise;Therapeutic activities;Gait training;Stair training;Patient/family education;Manual  techniques;Passive range of motion   PT Next Visit Plan work on strength, gait and balance   Consulted and Agree with Plan of Care Patient      Patient will benefit from skilled therapeutic intervention in order to improve the following deficits and impairments:  Abnormal gait, Decreased activity tolerance, Decreased mobility, Decreased strength, Decreased endurance, Decreased range of motion, Difficulty walking, Impaired flexibility, Pain  Visit Diagnosis: Muscle weakness (generalized) - Plan: PT plan of care cert/re-cert  Difficulty in walking, not elsewhere classified - Plan: PT plan of care cert/re-cert  Repeated falls - Plan: PT plan of care cert/re-cert  Chronic low back pain, unspecified back pain laterality, with sciatica presence unspecified - Plan: PT plan of care cert/re-cert  Stiffness of left hip, not elsewhere classified - Plan: PT plan of care cert/re-cert  Stiffness of right hip, not elsewhere classified - Plan: PT plan of care cert/re-cert     Problem List Patient Active Problem List   Diagnosis Date Noted  . Varicose veins of left lower extremity with complications 76/80/8811  . Knee pain, acute 01/26/2013  . Constipation 12/22/2012  . Neuropathy 12/22/2012  . Prostate hypertrophy 12/22/2012  . Acute blood loss anemia 12/22/2012  . Hyperlipidemia 12/22/2012  . Diabetes ([place]) 12/17/2012  . GERD (gastroesophageal reflux disease) 12/17/2012  . Hypertension 12/17/2012  . Thrombocytopenia, unspecified ([place]) 12/17/2012  . Postoperative anemia due to acute blood loss 12/15/2012  . Hyponatremia 12/15/2012  . OA (osteoarthritis) of knee 12/14/2012    [name], PT 12/03/2016, 4:15 PM  [place], [place], [address] Phone: [phone]   Fax:  [phone]  Name: Keith [name] MRN: [id number] Date of Birth: 07-Mar-1941

## 2016-12-04 DIAGNOSIS — I350 Nonrheumatic aortic (valve) stenosis: Secondary | ICD-10-CM | POA: Diagnosis not present

## 2016-12-04 DIAGNOSIS — R001 Bradycardia, unspecified: Secondary | ICD-10-CM | POA: Diagnosis not present

## 2016-12-04 DIAGNOSIS — C8298 Follicular lymphoma, unspecified, lymph nodes of multiple sites: Secondary | ICD-10-CM | POA: Diagnosis not present

## 2016-12-04 DIAGNOSIS — I4430 Unspecified atrioventricular block: Secondary | ICD-10-CM | POA: Diagnosis not present

## 2016-12-05 ENCOUNTER — Encounter: Payer: Self-pay | Admitting: Physical Therapy

## 2016-12-05 ENCOUNTER — Ambulatory Visit: Payer: Medicare Other | Admitting: Physical Therapy

## 2016-12-05 DIAGNOSIS — M6281 Muscle weakness (generalized): Secondary | ICD-10-CM

## 2016-12-05 DIAGNOSIS — R262 Difficulty in walking, not elsewhere classified: Secondary | ICD-10-CM

## 2016-12-05 DIAGNOSIS — G8929 Other chronic pain: Secondary | ICD-10-CM | POA: Diagnosis not present

## 2016-12-05 DIAGNOSIS — M25651 Stiffness of right hip, not elsewhere classified: Secondary | ICD-10-CM | POA: Diagnosis not present

## 2016-12-05 DIAGNOSIS — R296 Repeated falls: Secondary | ICD-10-CM

## 2016-12-05 DIAGNOSIS — M545 Low back pain: Secondary | ICD-10-CM | POA: Diagnosis not present

## 2016-12-05 DIAGNOSIS — M25652 Stiffness of left hip, not elsewhere classified: Secondary | ICD-10-CM | POA: Diagnosis not present

## 2016-12-05 NOTE — Therapy (Signed)
[place], [place], [address] Phone: [phone]   Fax:  [phone]  Physical Therapy Treatment  Patient Details  Name: Keith [name] MRN: [id number] Date of Birth: 1940-07-11 Referring Provider: Osborne Casco  Encounter Date: 12/05/2016      PT End of Session - 12/05/16 7948    Visit Number 15   Date for PT Re-Evaluation 01/02/17   PT Start Time 0165   PT Stop Time 1522   PT Time Calculation (min) 37 min   Activity Tolerance Patient limited by pain   Behavior During Therapy [facility] for tasks assessed/performed      Past Medical History:  Diagnosis Date  . Cirrhosis of liver ([place])   . Diabetes mellitus   . Hepatic cancer ([place])   . Hypertension   . Prostate hypertrophy   . Stroke ([place])   . Varicose veins     Past Surgical History:  Procedure Laterality Date  . APPENDECTOMY    . EYE SURGERY     bilateral cataract   . left arm surgery     age 35  . left hand surgery     tendons ripped on left hand  . LIVER RESECTION    . TONSILLECTOMY    . TOTAL KNEE ARTHROPLASTY Left 12/14/2012   Procedure: LEFT TOTAL KNEE ARTHROPLASTY;  Surgeon: Gearlean Alf, MD;  [facility];  Service: Orthopedics;  Laterality: Left;    There were no vitals filed for this visit.      Subjective Assessment - 12/05/16 1447    Subjective Patient reports doing very bad today, reports that he was at two MD offices yesterday, "left home at 5:45AM and got hom at 4PM" and then reports that he was at the [place] today, "I am just wore out and hurting"   Currently in Pain? Yes   Pain Score 8    Pain Location Leg   Pain Orientation Right;Left;Lower;Upper   Aggravating Factors  walking                         [facility] Adult PT Treatment/Exercise - 12/05/16 0001      Ambulation/Gait   Gait Comments HHA 180 ' very slow and more using the wall today     Manual Therapy   Manual Therapy Soft tissue  mobilization   Manual therapy comments STM of the quads and ITB and into the calves   Soft tissue mobilization PROM of the LE's in sitting   Other Manual Therapy STM to the upper traps and rhomboids and left thigh                    PT Short Term Goals - 10/02/16 1514      PT SHORT TERM GOAL #1   Title patient to be independent with initial HEP    Time 2   Period Weeks   Status Achieved           PT Long Term Goals - 12/03/16 1612      PT LONG TERM GOAL #1   Title Patient to improve TUG score to 20 seconds    Status On-going     PT LONG TERM GOAL #2   Title Patient to report ability to walk 350 feet without rest or increase in low back pain    Status Partially Met     PT LONG TERM GOAL #3   Title  increase LE strength to 4+/5   Status Partially Met     PT LONG TERM GOAL #4   Title be independent with gym exercises   Status On-going               Plan - 12/05/16 1513    Clinical Impression Statement Patient much worse today, reports that he has spent the last two days going to MD visits and had difficulty walking after all the time walking and being on his feet, reports that he may have to have a heart catheterization   PT Next Visit Plan work on strength, gait and balance   Consulted and Agree with Plan of Care Patient      Patient will benefit from skilled therapeutic intervention in order to improve the following deficits and impairments:  Abnormal gait, Decreased activity tolerance, Decreased mobility, Decreased strength, Decreased endurance, Decreased range of motion, Difficulty walking, Impaired flexibility, Pain  Visit Diagnosis: Muscle weakness (generalized)  Difficulty in walking, not elsewhere classified  Repeated falls  Chronic low back pain, unspecified back pain laterality, with sciatica presence unspecified     Problem List Patient Active Problem List   Diagnosis Date Noted  . Varicose veins of left lower extremity with  complications 63/86/8548  . Knee pain, acute 01/26/2013  . Constipation 12/22/2012  . Neuropathy 12/22/2012  . Prostate hypertrophy 12/22/2012  . Acute blood loss anemia 12/22/2012  . Hyperlipidemia 12/22/2012  . Diabetes ([place]) 12/17/2012  . GERD (gastroesophageal reflux disease) 12/17/2012  . Hypertension 12/17/2012  . Thrombocytopenia, unspecified ([place]) 12/17/2012  . Postoperative anemia due to acute blood loss 12/15/2012  . Hyponatremia 12/15/2012  . OA (osteoarthritis) of knee 12/14/2012    [name], PT 12/05/2016, 3:18 PM  [place] [address] [place], [place], [address] Phone: [phone]   Fax:  [phone]  Name: Keith [name] MRN: [id number] Date of Birth: 11-29-1940

## 2016-12-10 DIAGNOSIS — D649 Anemia, unspecified: Secondary | ICD-10-CM | POA: Diagnosis not present

## 2016-12-10 DIAGNOSIS — C220 Liver cell carcinoma: Secondary | ICD-10-CM | POA: Diagnosis not present

## 2016-12-10 DIAGNOSIS — K7581 Nonalcoholic steatohepatitis (NASH): Secondary | ICD-10-CM | POA: Diagnosis not present

## 2016-12-10 DIAGNOSIS — K7460 Unspecified cirrhosis of liver: Secondary | ICD-10-CM | POA: Diagnosis not present

## 2016-12-11 ENCOUNTER — Ambulatory Visit: Payer: Medicare Other | Attending: Internal Medicine | Admitting: Physical Therapy

## 2016-12-11 ENCOUNTER — Encounter: Payer: Self-pay | Admitting: Physical Therapy

## 2016-12-11 DIAGNOSIS — M25651 Stiffness of right hip, not elsewhere classified: Secondary | ICD-10-CM | POA: Insufficient documentation

## 2016-12-11 DIAGNOSIS — M545 Low back pain: Secondary | ICD-10-CM | POA: Diagnosis present

## 2016-12-11 DIAGNOSIS — R296 Repeated falls: Secondary | ICD-10-CM | POA: Diagnosis present

## 2016-12-11 DIAGNOSIS — M25652 Stiffness of left hip, not elsewhere classified: Secondary | ICD-10-CM | POA: Insufficient documentation

## 2016-12-11 DIAGNOSIS — M6281 Muscle weakness (generalized): Secondary | ICD-10-CM

## 2016-12-11 DIAGNOSIS — R262 Difficulty in walking, not elsewhere classified: Secondary | ICD-10-CM | POA: Diagnosis present

## 2016-12-11 DIAGNOSIS — G8929 Other chronic pain: Secondary | ICD-10-CM | POA: Diagnosis present

## 2016-12-11 NOTE — Therapy (Signed)
[place], [place], [address] Phone: [phone]   Fax:  [phone]  Physical Therapy Treatment  Patient Details  Name: Keith [name] MRN: [id number] Date of Birth: [birth year] Referring Provider: Osborne Casco  Encounter Date: 12/11/2016      PT End of Session - 12/11/16 1618    Visit Number 16   Date for PT Re-Evaluation 01/02/17   PT Start Time 1448   PT Stop Time 1533   PT Time Calculation (min) 45 min   Activity Tolerance Patient limited by pain   Behavior During Therapy [facility] for tasks assessed/performed      Past Medical History:  Diagnosis Date  . Cirrhosis of liver ([place])   . Diabetes mellitus   . Hepatic cancer ([place])   . Hypertension   . Prostate hypertrophy   . Stroke ([place])   . Varicose veins     Past Surgical History:  Procedure Laterality Date  . APPENDECTOMY    . EYE SURGERY     bilateral cataract   . left arm surgery     age 75  . left hand surgery     tendons ripped on left hand  . LIVER RESECTION    . TONSILLECTOMY    . TOTAL KNEE ARTHROPLASTY Left 12/14/2012   Procedure: LEFT TOTAL KNEE ARTHROPLASTY;  Surgeon: Gearlean Alf, MD;  [facility];  Service: Orthopedics;  Laterality: Left;    There were no vitals filed for this visit.      Subjective Assessment - 12/11/16 1451    Subjective Patient reports that he has continued to have a lot of issues due to him going to "many doctors" including going to [place], [place] in [place] and to the dentist.  He reports that all things have checked out pretty good but reports that he may have to have a cathetrization   Currently in Pain? Yes   Pain Score 5    Pain Location Leg   Pain Orientation Right;Left;Upper;Lower   Pain Descriptors / Indicators Sore;Tightness   Pain Type Chronic pain   Aggravating Factors  just being so active has really wore me out            [facility] PT Assessment - 12/11/16 0001      Timed  Up and Go Test   Normal TUG (seconds) 26                     [facility] Adult PT Treatment/Exercise - 12/11/16 0001      Ambulation/Gait   Gait Comments HHA gait 180 feet x 2  off balance at times and using wall for correction     Lumbar Exercises: Machines for Strengthening   Other Lumbar Machine Exercise green tband scapular stabilization, green tband seated hip abduction, ball b/n knees squeeze     Manual Therapy   Manual Therapy Soft tissue mobilization   Manual therapy comments STM of the quads and ITB and into the calves   Soft tissue mobilization PROM of the LE's in sitting   Other Manual Therapy STM to the upper traps and rhomboids and left thigh                    PT Short Term Goals - 10/02/16 1514      PT SHORT TERM GOAL #1   Title patient to be independent with initial HEP    Time 2   Period Weeks  Status Achieved           PT Long Term Goals - 12/11/16 1620      PT LONG TERM GOAL #1   Title Patient to improve TUG score to 20 seconds    Status Partially Met               Plan - 12/11/16 1619    Clinical Impression Statement Patient still very fatigued with activity and with the things that he has been doing for his health with MD visits, he remains very tight in the mms of the LE's and the upper traps.  He was moving a little better today but still very unstable   PT Next Visit Plan work on strength, gait and balance   Consulted and Agree with Plan of Care Patient      Patient will benefit from skilled therapeutic intervention in order to improve the following deficits and impairments:  Abnormal gait, Decreased activity tolerance, Decreased mobility, Decreased strength, Decreased endurance, Decreased range of motion, Difficulty walking, Impaired flexibility, Pain  Visit Diagnosis: Muscle weakness (generalized)  Difficulty in walking, not elsewhere classified  Repeated falls  Chronic low back pain, unspecified back pain  laterality, with sciatica presence unspecified     Problem List Patient Active Problem List   Diagnosis Date Noted  . Varicose veins of left lower extremity with complications 50/56/9794  . Knee pain, acute 01/26/2013  . Constipation 12/22/2012  . Neuropathy 12/22/2012  . Prostate hypertrophy 12/22/2012  . Acute blood loss anemia 12/22/2012  . Hyperlipidemia 12/22/2012  . Diabetes ([place]) 12/17/2012  . GERD (gastroesophageal reflux disease) 12/17/2012  . Hypertension 12/17/2012  . Thrombocytopenia, unspecified ([place]) 12/17/2012  . Postoperative anemia due to acute blood loss 12/15/2012  . Hyponatremia 12/15/2012  . OA (osteoarthritis) of knee 12/14/2012    [name], PT 12/11/2016, 4:21 PM  [place] [address] [place], [place], [address] Phone: [phone]   Fax:  [phone]  Name: Keith [name] MRN: [id number] Date of Birth: [birth year]

## 2016-12-17 ENCOUNTER — Ambulatory Visit: Payer: Medicare Other | Admitting: Physical Therapy

## 2016-12-17 ENCOUNTER — Encounter: Payer: Self-pay | Admitting: Physical Therapy

## 2016-12-17 DIAGNOSIS — M6281 Muscle weakness (generalized): Secondary | ICD-10-CM | POA: Diagnosis not present

## 2016-12-17 DIAGNOSIS — R296 Repeated falls: Secondary | ICD-10-CM

## 2016-12-17 DIAGNOSIS — R262 Difficulty in walking, not elsewhere classified: Secondary | ICD-10-CM

## 2016-12-17 DIAGNOSIS — M25652 Stiffness of left hip, not elsewhere classified: Secondary | ICD-10-CM

## 2016-12-17 DIAGNOSIS — M545 Low back pain: Secondary | ICD-10-CM

## 2016-12-17 DIAGNOSIS — M25651 Stiffness of right hip, not elsewhere classified: Secondary | ICD-10-CM

## 2016-12-17 DIAGNOSIS — G8929 Other chronic pain: Secondary | ICD-10-CM

## 2016-12-17 NOTE — Therapy (Signed)
[place], [place], [address] Phone: [phone]   Fax:  [phone]  Physical Therapy Treatment  Patient Details  Name: Keith [name] MRN: [id number] Date of Birth: 10-23-1940 Referring Provider: Osborne Casco  Encounter Date: 12/17/2016      PT End of Session - 12/17/16 1522    Visit Number 17   Date for PT Re-Evaluation 01/02/17   PT Start Time 1350   PT Stop Time 1434   PT Time Calculation (min) 44 min   Activity Tolerance Patient limited by pain   Behavior During Therapy [facility] for tasks assessed/performed      Past Medical History:  Diagnosis Date  . Cirrhosis of liver ([place])   . Diabetes mellitus   . Hepatic cancer ([place])   . Hypertension   . Prostate hypertrophy   . Stroke ([place])   . Varicose veins     Past Surgical History:  Procedure Laterality Date  . APPENDECTOMY    . EYE SURGERY     bilateral cataract   . left arm surgery     age 14  . left hand surgery     tendons ripped on left hand  . LIVER RESECTION    . TONSILLECTOMY    . TOTAL KNEE ARTHROPLASTY Left 12/14/2012   Procedure: LEFT TOTAL KNEE ARTHROPLASTY;  Surgeon: Gearlean Alf, MD;  [facility];  Service: Orthopedics;  Laterality: Left;    There were no vitals filed for this visit.      Subjective Assessment - 12/17/16 1519    Subjective Patient continues to have health issues, c/o pain and difficulty walking.  "Not feeling well all around"   Currently in Pain? Yes   Pain Score 7    Pain Location Leg   Pain Orientation Right;Left;Upper;Lower   Aggravating Factors  just going sometimes I wake up with pain i the legs                         [facility] Adult PT Treatment/Exercise - 12/17/16 0001      Ambulation/Gait   Gait Comments HHA gait 180 feet x 2  off balance at times and using wall for correction     Lumbar Exercises: Machines for Strengthening   Other Lumbar Machine Exercise  green tband scapular stabilization, green tband seated hip abduction, ball b/n knees squeeze     Lumbar Exercises: Seated   Hip Flexion on Ball Limitations on mat no ball     Knee/Hip Exercises: Standing   Hip Flexion Both;2 sets;10 reps     Knee/Hip Exercises: Seated   Long Arc Quad Both;2 sets;10 reps   Marching Limitations 2x10 each leg      Manual Therapy   Manual Therapy Soft tissue mobilization   Manual therapy comments STM of the quads and ITB and into the calves                  PT Short Term Goals - 10/02/16 1514      PT SHORT TERM GOAL #1   Title patient to be independent with initial HEP    Time 2   Period Weeks   Status Achieved           PT Long Term Goals - 12/11/16 1620      PT LONG TERM GOAL #1   Title Patient to improve TUG score to 20 seconds    Status Partially  Met               Plan - 12/17/16 1522    Clinical Impression Statement Patient continues to have issues, he reports to me that he cannot do the nustep today due to pain and fatigue.  He is very tight in the LE's   PT Next Visit Plan work on strength, gait and balance   Consulted and Agree with Plan of Care Patient      Patient will benefit from skilled therapeutic intervention in order to improve the following deficits and impairments:  Abnormal gait, Decreased activity tolerance, Decreased mobility, Decreased strength, Decreased endurance, Decreased range of motion, Difficulty walking, Impaired flexibility, Pain  Visit Diagnosis: Muscle weakness (generalized)  Difficulty in walking, not elsewhere classified  Repeated falls  Chronic low back pain, unspecified back pain laterality, with sciatica presence unspecified  Stiffness of left hip, not elsewhere classified  Stiffness of right hip, not elsewhere classified     Problem List Patient Active Problem List   Diagnosis Date Noted  . Varicose veins of left lower extremity with complications 32/95/1884  .  Knee pain, acute 01/26/2013  . Constipation 12/22/2012  . Neuropathy 12/22/2012  . Prostate hypertrophy 12/22/2012  . Acute blood loss anemia 12/22/2012  . Hyperlipidemia 12/22/2012  . Diabetes ([place]) 12/17/2012  . GERD (gastroesophageal reflux disease) 12/17/2012  . Hypertension 12/17/2012  . Thrombocytopenia, unspecified ([place]) 12/17/2012  . Postoperative anemia due to acute blood loss 12/15/2012  . Hyponatremia 12/15/2012  . OA (osteoarthritis) of knee 12/14/2012    [name], PT 12/17/2016, 3:24 PM  [place], [place], [address] Phone: [phone]   Fax:  [phone]  Name: Keith [name] MRN: [id number] Date of Birth: 1940/05/28

## 2016-12-19 ENCOUNTER — Encounter: Payer: Self-pay | Admitting: Physical Therapy

## 2016-12-19 ENCOUNTER — Ambulatory Visit: Payer: Medicare Other | Admitting: Physical Therapy

## 2016-12-19 DIAGNOSIS — M6281 Muscle weakness (generalized): Secondary | ICD-10-CM | POA: Diagnosis not present

## 2016-12-19 DIAGNOSIS — M545 Low back pain: Secondary | ICD-10-CM

## 2016-12-19 DIAGNOSIS — G8929 Other chronic pain: Secondary | ICD-10-CM

## 2016-12-19 DIAGNOSIS — R296 Repeated falls: Secondary | ICD-10-CM

## 2016-12-19 DIAGNOSIS — R262 Difficulty in walking, not elsewhere classified: Secondary | ICD-10-CM

## 2016-12-19 NOTE — Therapy (Signed)
[place], [place], [address] Phone: [phone]   Fax:  [phone]  Physical Therapy Treatment  Patient Details  Name: Keith [name] MRN: [id number] Date of Birth: April 22, 1940 Referring Provider: Osborne Casco  Encounter Date: 12/19/2016      PT End of Session - 12/19/16 1544    Visit Number 18   Date for PT Re-Evaluation 01/02/17   PT Start Time 7893   PT Stop Time 1437   PT Time Calculation (min) 43 min   Activity Tolerance Patient limited by pain   Behavior During Therapy [facility] for tasks assessed/performed      Past Medical History:  Diagnosis Date  . Cirrhosis of liver ([place])   . Diabetes mellitus   . Hepatic cancer ([place])   . Hypertension   . Prostate hypertrophy   . Stroke ([place])   . Varicose veins     Past Surgical History:  Procedure Laterality Date  . APPENDECTOMY    . EYE SURGERY     bilateral cataract   . left arm surgery     age 52  . left hand surgery     tendons ripped on left hand  . LIVER RESECTION    . TONSILLECTOMY    . TOTAL KNEE ARTHROPLASTY Left 12/14/2012   Procedure: LEFT TOTAL KNEE ARTHROPLASTY;  Surgeon: Gearlean Alf, MD;  [facility];  Service: Orthopedics;  Laterality: Left;    There were no vitals filed for this visit.      Subjective Assessment - 12/19/16 1505    Subjective Reports a fall yesterday, sore in the shoulder and the left knee   Currently in Pain? Yes   Pain Score 5    Pain Location Shoulder   Pain Orientation Left                         [facility] Adult PT Treatment/Exercise - 12/19/16 0001      Lumbar Exercises: Aerobic   Stationary Bike NuStep 8 min, lvl 4     Lumbar Exercises: Machines for Strengthening   Other Lumbar Machine Exercise green tband scapular stabilization, green tband seated hip abduction, ball b/n knees squeeze     Knee/Hip Exercises: Standing   Hip Flexion Both;2 sets;10 reps     Knee/Hip Exercises: Seated   Long Arc Quad Both;2 sets;10 reps   Marching Limitations 2x10 each leg      Manual Therapy   Manual Therapy Soft tissue mobilization   Manual therapy comments STM of the quads and ITB and into the calves   Soft tissue mobilization PROM of the LE's in sitting                  PT Short Term Goals - 10/02/16 1514      PT SHORT TERM GOAL #1   Title patient to be independent with initial HEP    Time 2   Period Weeks   Status Achieved           PT Long Term Goals - 12/19/16 1545      PT LONG TERM GOAL #1   Title Patient to improve TUG score to 20 seconds    Status Partially Met     PT LONG TERM GOAL #2   Title Patient to report ability to walk 350 feet without rest or increase in low back pain    Status Partially Met  Plan - 12/19/16 1545    Clinical Impression Statement Patient had a fall yesterday, but he comes in walking better and moving better.  Less assist with gait   PT Next Visit Plan work on strength, gait and balance   Consulted and Agree with Plan of Care Patient      Patient will benefit from skilled therapeutic intervention in order to improve the following deficits and impairments:  Abnormal gait, Decreased activity tolerance, Decreased mobility, Decreased strength, Decreased endurance, Decreased range of motion, Difficulty walking, Impaired flexibility, Pain  Visit Diagnosis: Muscle weakness (generalized)  Difficulty in walking, not elsewhere classified  Repeated falls  Chronic low back pain, unspecified back pain laterality, with sciatica presence unspecified     Problem List Patient Active Problem List   Diagnosis Date Noted  . Varicose veins of left lower extremity with complications 26/37/8588  . Knee pain, acute 01/26/2013  . Constipation 12/22/2012  . Neuropathy 12/22/2012  . Prostate hypertrophy 12/22/2012  . Acute blood loss anemia 12/22/2012  . Hyperlipidemia 12/22/2012  .  Diabetes ([place]) 12/17/2012  . GERD (gastroesophageal reflux disease) 12/17/2012  . Hypertension 12/17/2012  . Thrombocytopenia, unspecified ([place]) 12/17/2012  . Postoperative anemia due to acute blood loss 12/15/2012  . Hyponatremia 12/15/2012  . OA (osteoarthritis) of knee 12/14/2012    [name], PT 12/19/2016, 3:46 PM  [place] [address] [place], [place], [address] Phone: [phone]   Fax:  [phone]  Name: Keith [name] MRN: [id number] Date of Birth: 1940-05-05

## 2016-12-23 DIAGNOSIS — M545 Low back pain: Secondary | ICD-10-CM | POA: Diagnosis not present

## 2016-12-23 DIAGNOSIS — R29898 Other symptoms and signs involving the musculoskeletal system: Secondary | ICD-10-CM | POA: Diagnosis not present

## 2016-12-23 DIAGNOSIS — G8929 Other chronic pain: Secondary | ICD-10-CM | POA: Diagnosis not present

## 2016-12-24 ENCOUNTER — Ambulatory Visit: Payer: Medicare Other | Admitting: Physical Therapy

## 2016-12-24 ENCOUNTER — Encounter: Payer: Self-pay | Admitting: Physical Therapy

## 2016-12-24 DIAGNOSIS — G8929 Other chronic pain: Secondary | ICD-10-CM

## 2016-12-24 DIAGNOSIS — R296 Repeated falls: Secondary | ICD-10-CM

## 2016-12-24 DIAGNOSIS — M6281 Muscle weakness (generalized): Secondary | ICD-10-CM

## 2016-12-24 DIAGNOSIS — R262 Difficulty in walking, not elsewhere classified: Secondary | ICD-10-CM

## 2016-12-24 DIAGNOSIS — M545 Low back pain: Secondary | ICD-10-CM
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
## 2016-12-26 ENCOUNTER — Ambulatory Visit: Payer: Medicare Other | Admitting: Physical Therapy

## 2016-12-26 DIAGNOSIS — I2510 Atherosclerotic heart disease of native coronary artery without angina pectoris: Secondary | ICD-10-CM | POA: Diagnosis not present

## 2016-12-26 DIAGNOSIS — Z8673 Personal history of transient ischemic attack (TIA), and cerebral infarction without residual deficits: Secondary | ICD-10-CM | POA: Diagnosis not present

## 2016-12-26 DIAGNOSIS — E119 Type 2 diabetes mellitus without complications: Secondary | ICD-10-CM | POA: Diagnosis not present

## 2016-12-26 DIAGNOSIS — I350 Nonrheumatic aortic (valve) stenosis: Secondary | ICD-10-CM | POA: Diagnosis not present

## 2016-12-26 DIAGNOSIS — Z01810 Encounter for preprocedural cardiovascular examination: Secondary | ICD-10-CM | POA: Diagnosis not present

## 2016-12-31 ENCOUNTER — Ambulatory Visit: Payer: Medicare Other | Admitting: Physical Therapy

## 2017-01-02 ENCOUNTER — Ambulatory Visit: Payer: Medicare Other | Admitting: Physical Therapy

## 2017-01-08 ENCOUNTER — Encounter: Payer: Self-pay | Admitting: Physical Therapy

## 2017-01-08 ENCOUNTER — Ambulatory Visit: Payer: Medicare Other | Attending: Internal Medicine | Admitting: Physical Therapy

## 2017-01-08 DIAGNOSIS — R296 Repeated falls: Secondary | ICD-10-CM

## 2017-01-08 DIAGNOSIS — M25651 Stiffness of right hip, not elsewhere classified: Secondary | ICD-10-CM | POA: Insufficient documentation

## 2017-01-08 DIAGNOSIS — M25652 Stiffness of left hip, not elsewhere classified: Secondary | ICD-10-CM

## 2017-01-08 DIAGNOSIS — M6281 Muscle weakness (generalized): Secondary | ICD-10-CM | POA: Diagnosis not present

## 2017-01-08 DIAGNOSIS — G8929 Other chronic pain: Secondary | ICD-10-CM

## 2017-01-08 DIAGNOSIS — M545 Low back pain: Secondary | ICD-10-CM | POA: Insufficient documentation

## 2017-01-08 DIAGNOSIS — H4720 Unspecified optic atrophy: Secondary | ICD-10-CM | POA: Diagnosis not present

## 2017-01-08 DIAGNOSIS — E113553 Type 2 diabetes mellitus with stable proliferative diabetic retinopathy, bilateral: Secondary | ICD-10-CM | POA: Diagnosis not present

## 2017-01-08 DIAGNOSIS — H353131 Nonexudative age-related macular degeneration, bilateral, early dry stage: Secondary | ICD-10-CM | POA: Diagnosis not present

## 2017-01-08 DIAGNOSIS — R262 Difficulty in walking, not elsewhere classified: Secondary | ICD-10-CM | POA: Diagnosis not present

## 2017-01-08 NOTE — Therapy (Signed)
[place], [place], [address] Phone: [phone]   Fax:  [phone]  Physical Therapy Treatment  Patient Details  Name: Keith [name] MRN: [id number] Date of Birth: [birth year] Referring Provider: Osborne Casco  Encounter Date: 01/08/2017      PT End of Session - 01/08/17 1615    Visit Number 20   Date for PT Re-Evaluation 02/01/17   PT Start Time 1527   PT Stop Time 1610   PT Time Calculation (min) 43 min   Activity Tolerance Patient limited by pain;Patient limited by fatigue   Behavior During Therapy [facility] for tasks assessed/performed      Past Medical History:  Diagnosis Date  . Cirrhosis of liver ([place])   . Diabetes mellitus   . Hepatic cancer ([place])   . Hypertension   . Prostate hypertrophy   . Stroke ([place])   . Varicose veins     Past Surgical History:  Procedure Laterality Date  . APPENDECTOMY    . EYE SURGERY     bilateral cataract   . left arm surgery     age 44  . left hand surgery     tendons ripped on left hand  . LIVER RESECTION    . TONSILLECTOMY    . TOTAL KNEE ARTHROPLASTY Left 12/14/2012   Procedure: LEFT TOTAL KNEE ARTHROPLASTY;  Surgeon: Gearlean Alf, MD;  [facility];  Service: Orthopedics;  Laterality: Left;    There were no vitals filed for this visit.      Subjective Assessment - 01/08/17 1613    Subjective Patient has been out of town as his mom passed away, he had to travel to [place], he reports very fatigues and that he fell twice, reports no injuries            [facility] PT Assessment - 01/08/17 0001      Timed Up and Go Test   Normal TUG (seconds) 33                     [facility] Adult PT Treatment/Exercise - 01/08/17 0001      Lumbar Exercises: Aerobic   Stationary Bike NuStep Level 3 x 2 minutes   UBE (Upper Arm Bike) Level 3 x 4 minutes     Lumbar Exercises: Machines for Strengthening   Other Lumbar Machine Exercise green  tband scapular stabilization, green tband seated hip abduction, ball b/n knees squeeze     Lumbar Exercises: Supine   Other Supine Lumbar Exercises 2.5 # hip flexion while seated     Manual Therapy   Manual Therapy Soft tissue mobilization   Manual therapy comments STM of the quads and ITB and into the calves   Soft tissue mobilization PROM of the LE's in sitting   Other Manual Therapy STM to the upper traps and rhomboids and left thigh                    PT Short Term Goals - 10/02/16 1514      PT SHORT TERM GOAL #1   Title patient to be independent with initial HEP    Time 2   Period Weeks   Status Achieved           PT Long Term Goals - 01/08/17 1620      PT LONG TERM GOAL #1   Title Patient to improve TUG score to 20 seconds    Status  Partially Met     PT LONG TERM GOAL #2   Title Patient to report ability to walk 350 feet without rest or increase in low back pain    Status Partially Met     PT LONG TERM GOAL #3   Title increase LE strength to 4+/5   Status Partially Met     PT LONG TERM GOAL #4   Title be independent with gym exercises   Status On-going               Plan - January 26, 2017 1618    Clinical Impression Statement Patient has had a lot of other health issues, and many MD visits which has worn him out.  His mom also passed away and he had to travel to [place] and be very active.  He has regressed some in his abilities, he is having greater difficulty with his balance, he has had 4 falls in the past 40 days.   PT Frequency 1x / week   PT Duration 4 weeks   PT Next Visit Plan We will resume exercise and see if we can get him feeling better   Consulted and Agree with Plan of Care Patient      Patient will benefit from skilled therapeutic intervention in order to improve the following deficits and impairments:  Abnormal gait, Decreased activity tolerance, Decreased mobility, Decreased strength, Decreased endurance, Decreased range of  motion, Difficulty walking, Impaired flexibility, Pain  Visit Diagnosis: Muscle weakness (generalized) - Plan: PT plan of care cert/re-cert  Difficulty in walking, not elsewhere classified - Plan: PT plan of care cert/re-cert  Repeated falls - Plan: PT plan of care cert/re-cert  Chronic low back pain, unspecified back pain laterality, with sciatica presence unspecified - Plan: PT plan of care cert/re-cert  Stiffness of left hip, not elsewhere classified - Plan: PT plan of care cert/re-cert  Stiffness of right hip, not elsewhere classified - Plan: PT plan of care cert/re-cert       G-Codes - 2017-01-26 1621    Functional Assessment Tool Used (Outpatient Only) foto 63% limited   Functional Limitation Mobility: Walking and moving around   Mobility: Walking and Moving Around Current Status [phone]) At least 60 percent but less than 80 percent impaired, limited or restricted   Mobility: Walking and Moving Around Goal Status [phone]) At least 40 percent but less than 60 percent impaired, limited or restricted      Problem List Patient Active Problem List   Diagnosis Date Noted  . Varicose veins of left lower extremity with complications 94/17/4081  . Knee pain, acute 01/26/2013  . Constipation 12/22/2012  . Neuropathy 12/22/2012  . Prostate hypertrophy 12/22/2012  . Acute blood loss anemia 12/22/2012  . Hyperlipidemia 12/22/2012  . Diabetes ([place]) 12/17/2012  . GERD (gastroesophageal reflux disease) 12/17/2012  . Hypertension 12/17/2012  . Thrombocytopenia, unspecified ([place]) 12/17/2012  . Postoperative anemia due to acute blood loss 12/15/2012  . Hyponatremia 12/15/2012  . OA (osteoarthritis) of knee 12/14/2012    [name], PT 01-26-2017, 4:23 PM  [address] [place], [place], [address] Phone: [phone]   Fax:  [phone]  Name: MALACHI KINZLER MRN: [id number] Date of Birth:  10-09-1940

## 2017-01-14 ENCOUNTER — Ambulatory Visit: Payer: Medicare Other | Admitting: Physical Therapy

## 2017-01-14 ENCOUNTER — Encounter: Payer: Self-pay | Admitting: Physical Therapy

## 2017-01-14 DIAGNOSIS — M6281 Muscle weakness (generalized): Secondary | ICD-10-CM | POA: Diagnosis not present

## 2017-01-14 DIAGNOSIS — R296 Repeated falls: Secondary | ICD-10-CM

## 2017-01-14 DIAGNOSIS — R262 Difficulty in walking, not elsewhere classified: Secondary | ICD-10-CM

## 2017-01-14 DIAGNOSIS — M545 Low back pain: Secondary | ICD-10-CM

## 2017-01-14 DIAGNOSIS — G8929 Other chronic pain: Secondary | ICD-10-CM

## 2017-01-14 NOTE — Therapy (Signed)
[place], [place], [address] Phone: [phone]   Fax:  [phone]  Physical Therapy Treatment  Patient Details  Name: Keith [name] MRN: [id number] Date of Birth: 27-Sep-1940 Referring Provider: Osborne Casco  Encounter Date: 01/14/2017      PT End of Session - 01/14/17 1459    Visit Number 21   Date for PT Re-Evaluation 02/01/17   PT Start Time 8242   PT Stop Time 1537   PT Time Calculation (min) 48 min   Activity Tolerance Patient limited by pain;Patient limited by fatigue   Behavior During Therapy [facility] for tasks assessed/performed      Past Medical History:  Diagnosis Date  . Cirrhosis of liver ([place])   . Diabetes mellitus   . Hepatic cancer ([place])   . Hypertension   . Prostate hypertrophy   . Stroke ([place])   . Varicose veins     Past Surgical History:  Procedure Laterality Date  . APPENDECTOMY    . EYE SURGERY     bilateral cataract   . left arm surgery     age 24  . left hand surgery     tendons ripped on left hand  . LIVER RESECTION    . TONSILLECTOMY    . TOTAL KNEE ARTHROPLASTY Left 12/14/2012   Procedure: LEFT TOTAL KNEE ARTHROPLASTY;  Surgeon: Gearlean Alf, MD;  [facility];  Service: Orthopedics;  Laterality: Left;    There were no vitals filed for this visit.      Subjective Assessment - 01/14/17 1455    Subjective Reports feeling better and settling back in after his mom passed away, no more falls,    Currently in Pain? Yes   Pain Score 6    Pain Location Leg   Pain Orientation Right            [facility] PT Assessment - 01/14/17 0001      Ambulation/Gait   Gait Comments HHA x 180 feet unsteady     Timed Up and Go Test   Normal TUG (seconds) 30                     [facility] Adult PT Treatment/Exercise - 01/14/17 0001      Lumbar Exercises: Aerobic   Stationary Bike NuStep Level 3 x 2 minutes   UBE (Upper Arm Bike) Level 3 x 4 minutes      Lumbar Exercises: Machines for Strengthening   Other Lumbar Machine Exercise green tband scapular stabilization, green tband seated hip abduction, ball b/n knees squeeze     Lumbar Exercises: Supine   Other Supine Lumbar Exercises 2.5 # hip flexion while seated     Manual Therapy   Manual Therapy Soft tissue mobilization   Manual therapy comments STM of the quads and ITB and into the calves   Soft tissue mobilization PROM of the LE's in sitting   Other Manual Therapy STM to the upper traps and rhomboids and left thigh                    PT Short Term Goals - 10/02/16 1514      PT SHORT TERM GOAL #1   Title patient to be independent with initial HEP    Time 2   Period Weeks   Status Achieved           PT Long Term Goals - 01/14/17 1501  PT LONG TERM GOAL #1   Title Patient to improve TUG score to 20 seconds    Status Partially Met               Plan - 01/14/17 1459    Clinical Impression Statement Easing back into exercises, he is having pain, and is very unsteady on his feet.  Tolerated more today   PT Next Visit Plan We will resume exercise and see if we can get him feeling better   Consulted and Agree with Plan of Care Patient      Patient will benefit from skilled therapeutic intervention in order to improve the following deficits and impairments:  Abnormal gait, Decreased activity tolerance, Decreased mobility, Decreased strength, Decreased endurance, Decreased range of motion, Difficulty walking, Impaired flexibility, Pain  Visit Diagnosis: Muscle weakness (generalized)  Difficulty in walking, not elsewhere classified  Repeated falls  Chronic low back pain, unspecified back pain laterality, with sciatica presence unspecified     Problem List Patient Active Problem List   Diagnosis Date Noted  . Varicose veins of left lower extremity with complications 35/52/1747  . Knee pain, acute 01/26/2013  . Constipation 12/22/2012  .  Neuropathy 12/22/2012  . Prostate hypertrophy 12/22/2012  . Acute blood loss anemia 12/22/2012  . Hyperlipidemia 12/22/2012  . Diabetes ([place]) 12/17/2012  . GERD (gastroesophageal reflux disease) 12/17/2012  . Hypertension 12/17/2012  . Thrombocytopenia, unspecified ([place]) 12/17/2012  . Postoperative anemia due to acute blood loss 12/15/2012  . Hyponatremia 12/15/2012  . OA (osteoarthritis) of knee 12/14/2012    [name], PT 01/14/2017, 3:02 PM  [place], [place], [address] Phone: [phone]   Fax:  [phone]  Name: EBIN PALAZZI MRN: [id number] Date of Birth: 1940/12/13

## 2017-01-15 ENCOUNTER — Ambulatory Visit: Payer: Medicare Other | Admitting: Physical Therapy

## 2017-01-15 ENCOUNTER — Encounter: Payer: Self-pay | Admitting: Physical Therapy

## 2017-01-15 DIAGNOSIS — M6281 Muscle weakness (generalized): Secondary | ICD-10-CM | POA: Diagnosis not present

## 2017-01-15 DIAGNOSIS — G8929 Other chronic pain: Secondary | ICD-10-CM

## 2017-01-15 DIAGNOSIS — R296 Repeated falls: Secondary | ICD-10-CM

## 2017-01-15 DIAGNOSIS — R262 Difficulty in walking, not elsewhere classified: Secondary | ICD-10-CM

## 2017-01-15 DIAGNOSIS — M545 Low back pain: Secondary | ICD-10-CM

## 2017-01-15 NOTE — Therapy (Signed)
[place], [place], [address] Phone: [phone]   Fax:  [phone]  Physical Therapy Treatment  Patient Details  Name: Keith [name] MRN: [id number] Date of Birth: 10/08/1940 Referring Provider: Osborne Casco  Encounter Date: 01/15/2017      PT End of Session - 01/15/17 1701    Visit Number 22   Date for PT Re-Evaluation 02/01/17   PT Start Time 1440   PT Stop Time 1529   PT Time Calculation (min) 49 min   Activity Tolerance Patient limited by pain;Patient limited by fatigue   Behavior During Therapy [facility] for tasks assessed/performed      Past Medical History:  Diagnosis Date  . Cirrhosis of liver ([place])   . Diabetes mellitus   . Hepatic cancer ([place])   . Hypertension   . Prostate hypertrophy   . Stroke ([place])   . Varicose veins     Past Surgical History:  Procedure Laterality Date  . APPENDECTOMY    . EYE SURGERY     bilateral cataract   . left arm surgery     age 76  . left hand surgery     tendons ripped on left hand  . LIVER RESECTION    . TONSILLECTOMY    . TOTAL KNEE ARTHROPLASTY Left 12/14/2012   Procedure: LEFT TOTAL KNEE ARTHROPLASTY;  Surgeon: Gearlean Alf, MD;  [facility];  Service: Orthopedics;  Laterality: Left;    There were no vitals filed for this visit.      Subjective Assessment - 01/15/17 1452    Subjective Reports that he was pretty sore after returning to exercises   Currently in Pain? Yes   Pain Score 7    Pain Location Leg   Pain Orientation Right   Pain Descriptors / Indicators Sore                         [facility] Adult PT Treatment/Exercise - 01/15/17 0001      Ambulation/Gait   Gait Comments HHA 180 feet x 2     Lumbar Exercises: Aerobic   Stationary Bike NuStep Level 4 x 6 minutes   UBE (Upper Arm Bike) Level 3 x 4 minutes     Lumbar Exercises: Machines for Strengthening   Other Lumbar Machine Exercise green  tband scapular stabilization, green tband seated hip abduction, ball b/n knees squeeze     Manual Therapy   Manual Therapy Soft tissue mobilization   Manual therapy comments STM of the quads and ITB and into the calves   Soft tissue mobilization PROM of the LE's in sitting   Other Manual Therapy STM to the upper traps and rhomboids and left thigh                    PT Short Term Goals - 10/02/16 1514      PT SHORT TERM GOAL #1   Title patient to be independent with initial HEP    Time 2   Period Weeks   Status Achieved           PT Long Term Goals - 01/15/17 1706      PT LONG TERM GOAL #1   Title Patient to improve TUG score to 20 seconds    Status Partially Met               Plan - 01/15/17 1705    Clinical  Impression Statement Some continued soreness with [place] working on increasing exercises   PT Next Visit Plan We will resume exercise and see if we can get him feeling better   Consulted and Agree with Plan of Care Patient      Patient will benefit from skilled therapeutic intervention in order to improve the following deficits and impairments:  Abnormal gait, Decreased activity tolerance, Decreased mobility, Decreased strength, Decreased endurance, Decreased range of motion, Difficulty walking, Impaired flexibility, Pain  Visit Diagnosis: Muscle weakness (generalized)  Difficulty in walking, not elsewhere classified  Repeated falls  Chronic low back pain, unspecified back pain laterality, with sciatica presence unspecified     Problem List Patient Active Problem List   Diagnosis Date Noted  . Varicose veins of left lower extremity with complications 86/76/7209  . Knee pain, acute 01/26/2013  . Constipation 12/22/2012  . Neuropathy 12/22/2012  . Prostate hypertrophy 12/22/2012  . Acute blood loss anemia 12/22/2012  . Hyperlipidemia 12/22/2012  . Diabetes ([place]) 12/17/2012  . GERD (gastroesophageal reflux disease) 12/17/2012  .  Hypertension 12/17/2012  . Thrombocytopenia, unspecified ([place]) 12/17/2012  . Postoperative anemia due to acute blood loss 12/15/2012  . Hyponatremia 12/15/2012  . OA (osteoarthritis) of knee 12/14/2012    [name], PT 01/15/2017, 5:07 PM  [place], [place], [address] Phone: [phone]   Fax:  [phone]  Name: LAYSON BERTSCH MRN: [id number] Date of Birth: 1940-10-21

## 2017-01-21 ENCOUNTER — Ambulatory Visit: Payer: Medicare Other | Admitting: Physical Therapy

## 2017-01-21 ENCOUNTER — Encounter: Payer: Self-pay | Admitting: Physical Therapy

## 2017-01-21 DIAGNOSIS — G8929 Other chronic pain: Secondary | ICD-10-CM

## 2017-01-21 DIAGNOSIS — R296 Repeated falls: Secondary | ICD-10-CM

## 2017-01-21 DIAGNOSIS — M6281 Muscle weakness (generalized): Secondary | ICD-10-CM | POA: Diagnosis not present

## 2017-01-21 DIAGNOSIS — R262 Difficulty in walking, not elsewhere classified: Secondary | ICD-10-CM

## 2017-01-21 DIAGNOSIS — M545 Low back pain: Secondary | ICD-10-CM

## 2017-01-21 NOTE — Therapy (Signed)
[place], [place], [address] Phone: [phone]   Fax:  [phone]  Physical Therapy Treatment  Patient Details  Name: Keith [name] MRN: [id number] Date of Birth: 02-05-1941 Referring Provider: Osborne Casco  Encounter Date: 01/21/2017      PT End of Session - 01/21/17 1358    Visit Number 23   Date for PT Re-Evaluation 02/01/17   PT Start Time 1310   PT Stop Time 8003   PT Time Calculation (min) 49 min   Activity Tolerance Patient limited by pain;Patient limited by fatigue   Behavior During Therapy [facility] for tasks assessed/performed      Past Medical History:  Diagnosis Date  . Cirrhosis of liver ([place])   . Diabetes mellitus   . Hepatic cancer ([place])   . Hypertension   . Prostate hypertrophy   . Stroke ([place])   . Varicose veins     Past Surgical History:  Procedure Laterality Date  . APPENDECTOMY    . EYE SURGERY     bilateral cataract   . left arm surgery     age 76  . left hand surgery     tendons ripped on left hand  . LIVER RESECTION    . TONSILLECTOMY    . TOTAL KNEE ARTHROPLASTY Left 12/14/2012   Procedure: LEFT TOTAL KNEE ARTHROPLASTY;  Surgeon: Gearlean Alf, MD;  [facility];  Service: Orthopedics;  Laterality: Left;    There were no vitals filed for this visit.      Subjective Assessment - 01/21/17 1322    Subjective Reports that he had a bad weekend, "I just felt really weak the past 3 days"  Reports some pain inthe right thigh.   Currently in Pain? Yes   Pain Score 6    Pain Location Leg   Pain Orientation Right;Upper   Pain Descriptors / Indicators Sore;Tightness                         [facility] Adult PT Treatment/Exercise - 01/21/17 0001      Ambulation/Gait   Gait Comments HHA 180 feet x 2     Lumbar Exercises: Aerobic   Stationary Bike NuStep Level 4 x 8 minutes   UBE (Upper Arm Bike) Level 3 x 4 minutes     Lumbar Exercises:  Machines for Strengthening   Other Lumbar Machine Exercise green tband scapular stabilization, green tband seated hip abduction, ball b/n knees squeeze     Lumbar Exercises: Seated   Long Arc Quad on Chair Strengthening;Right;Left;2 sets;10 reps;Weights   LAQ on Chair Weights (lbs) 2     Lumbar Exercises: Supine   Other Supine Lumbar Exercises 2.5 # hip flexion while seated     Manual Therapy   Manual Therapy Soft tissue mobilization   Manual therapy comments STM of the quads and ITB and into the calves   Other Manual Therapy STM to the upper traps and rhomboids and left thigh                    PT Short Term Goals - 10/02/16 1514      PT SHORT TERM GOAL #1   Title patient to be independent with initial HEP    Time 2   Period Weeks   Status Achieved           PT Long Term Goals - 01/15/17 4917  PT LONG TERM GOAL #1   Title Patient to improve TUG score to 20 seconds    Status Partially Met               Plan - 01/21/17 1358    Clinical Impression Statement Patient reports that he was feeling better after we started the exercises, just tired, but reports over the weekend he was very weak and had difficulty getting up from sitting   PT Next Visit Plan continue to add exercises as tolerated, could try balance activities   Consulted and Agree with Plan of Care Patient      Patient will benefit from skilled therapeutic intervention in order to improve the following deficits and impairments:  Abnormal gait, Decreased activity tolerance, Decreased mobility, Decreased strength, Decreased endurance, Decreased range of motion, Difficulty walking, Impaired flexibility, Pain  Visit Diagnosis: Muscle weakness (generalized)  Difficulty in walking, not elsewhere classified  Chronic low back pain, unspecified back pain laterality, with sciatica presence unspecified  Repeated falls     Problem List Patient Active Problem List   Diagnosis Date Noted  .  Varicose veins of left lower extremity with complications 32/03/2481  . Knee pain, acute 01/26/2013  . Constipation 12/22/2012  . Neuropathy 12/22/2012  . Prostate hypertrophy 12/22/2012  . Acute blood loss anemia 12/22/2012  . Hyperlipidemia 12/22/2012  . Diabetes ([place]) 12/17/2012  . GERD (gastroesophageal reflux disease) 12/17/2012  . Hypertension 12/17/2012  . Thrombocytopenia, unspecified ([place]) 12/17/2012  . Postoperative anemia due to acute blood loss 12/15/2012  . Hyponatremia 12/15/2012  . OA (osteoarthritis) of knee 12/14/2012    [name], PT 01/21/2017, 2:00 PM  [place] [address] [place], [place], [address] Phone: [phone]   Fax:  [phone]  Name: AKSHAR STARNES MRN: [id number] Date of Birth: [birth year]

## 2017-01-23 ENCOUNTER — Ambulatory Visit: Payer: Medicare Other | Admitting: Physical Therapy

## 2017-01-23 ENCOUNTER — Encounter: Payer: Self-pay | Admitting: Physical Therapy

## 2017-01-23 DIAGNOSIS — G8929 Other chronic pain: Secondary | ICD-10-CM

## 2017-01-23 DIAGNOSIS — M25651 Stiffness of right hip, not elsewhere classified: Secondary | ICD-10-CM | POA: Diagnosis not present

## 2017-01-23 DIAGNOSIS — M545 Low back pain: Secondary | ICD-10-CM

## 2017-01-23 DIAGNOSIS — M25652 Stiffness of left hip, not elsewhere classified: Secondary | ICD-10-CM | POA: Diagnosis not present

## 2017-01-23 DIAGNOSIS — M6281 Muscle weakness (generalized): Secondary | ICD-10-CM | POA: Diagnosis not present

## 2017-01-23 DIAGNOSIS — R296 Repeated falls: Secondary | ICD-10-CM

## 2017-01-23 DIAGNOSIS — R262 Difficulty in walking, not elsewhere classified: Secondary | ICD-10-CM | POA: Diagnosis not present

## 2017-01-23 NOTE — Therapy (Signed)
[place] [address] [place], [place], [address] Phone: [phone]   Fax:  [phone]  Physical Therapy Treatment  Patient Details  Name: Keith [name] MRN: [id number] Date of Birth: 21-Apr-1940 Referring Provider: Osborne Casco  Encounter Date: 01/23/2017      PT End of Session - 01/23/17 1405    Visit Number 24   Date for PT Re-Evaluation 02/01/17   PT Start Time 1310   PT Stop Time 1356   PT Time Calculation (min) 46 min   Activity Tolerance Patient limited by pain;Patient limited by fatigue   Behavior During Therapy [facility] for tasks assessed/performed      Past Medical History:  Diagnosis Date  . Cirrhosis of liver ([place])   . Diabetes mellitus   . Hepatic cancer ([place])   . Hypertension   . Prostate hypertrophy   . Stroke ([place])   . Varicose veins     Past Surgical History:  Procedure Laterality Date  . APPENDECTOMY    . EYE SURGERY     bilateral cataract   . left arm surgery     age 45  . left hand surgery     tendons ripped on left hand  . LIVER RESECTION    . TONSILLECTOMY    . TOTAL KNEE ARTHROPLASTY Left 12/14/2012   Procedure: LEFT TOTAL KNEE ARTHROPLASTY;  Surgeon: Gearlean Alf, MD;  Location: [facility];  Service: Orthopedics;  Laterality: Left;    There were no vitals filed for this visit.      Subjective Assessment - 01/23/17 1404    Subjective Reports that he has had to go to a few MD and dentist visits and his legs are really hurting today   Currently in Pain? Yes   Pain Score 8    Pain Location Leg   Pain Orientation Right;Left;Upper   Aggravating Factors  just being up on them                         [facility] Adult PT Treatment/Exercise - 01/23/17 0001      Lumbar Exercises: Aerobic   Stationary Bike NuStep Level 4 x 8 minutes   UBE (Upper Arm Bike) Level 3 x 4 minutes     Lumbar Exercises: Machines for Strengthening   Other Lumbar Machine Exercise green  tband scapular stabilization, green tband seated hip abduction, ball b/n knees squeeze     Manual Therapy   Manual Therapy Soft tissue mobilization   Manual therapy comments STM of the quads and ITB and into the calves   Other Manual Therapy STM to the upper traps and rhomboids and left thigh                    PT Short Term Goals - 10/02/16 1514      PT SHORT TERM GOAL #1   Title patient to be independent with initial HEP    Time 2   Period Weeks   Status Achieved           PT Long Term Goals - 01/23/17 1408      PT LONG TERM GOAL #1   Title Patient to improve TUG score to 20 seconds    Status Partially Met     PT LONG TERM GOAL #3   Title increase LE strength to 4+/5   Status Partially Met  Plan - 01/23/17 1406    Clinical Impression Statement REports that he is not feeling great, legs are heavy and really hurting, he does not think that is neuropathy, reports more in the thighs   PT Next Visit Plan continue to add exercises as tolerated, could try balance activities   Consulted and Agree with Plan of Care Patient      Patient will benefit from skilled therapeutic intervention in order to improve the following deficits and impairments:  Abnormal gait, Decreased activity tolerance, Decreased mobility, Decreased strength, Decreased endurance, Decreased range of motion, Difficulty walking, Impaired flexibility, Pain  Visit Diagnosis: Muscle weakness (generalized)  Difficulty in walking, not elsewhere classified  Chronic low back pain, unspecified back pain laterality, with sciatica presence unspecified  Repeated falls     Problem List Patient Active Problem List   Diagnosis Date Noted  . Varicose veins of left lower extremity with complications 94/70/9628  . Knee pain, acute 01/26/2013  . Constipation 12/22/2012  . Neuropathy 12/22/2012  . Prostate hypertrophy 12/22/2012  . Acute blood loss anemia 12/22/2012  .  Hyperlipidemia 12/22/2012  . Diabetes ([place]) 12/17/2012  . GERD (gastroesophageal reflux disease) 12/17/2012  . Hypertension 12/17/2012  . Thrombocytopenia, unspecified ([place]) 12/17/2012  . Postoperative anemia due to acute blood loss 12/15/2012  . Hyponatremia 12/15/2012  . OA (osteoarthritis) of knee 12/14/2012    [name], PT 01/23/2017, 2:10 PM  [address] [place], [place], [address] Phone: [phone]   Fax:  [phone]  Name: Keith [name] MRN: [id number] Date of Birth: 06-16-1940

## 2017-01-28 ENCOUNTER — Ambulatory Visit: Payer: Medicare Other | Admitting: Physical Therapy

## 2017-01-28 ENCOUNTER — Encounter: Payer: Self-pay | Admitting: Physical Therapy

## 2017-01-28 DIAGNOSIS — R262 Difficulty in walking, not elsewhere classified: Secondary | ICD-10-CM | POA: Diagnosis not present

## 2017-01-28 DIAGNOSIS — G8929 Other chronic pain: Secondary | ICD-10-CM

## 2017-01-28 DIAGNOSIS — M6281 Muscle weakness (generalized): Secondary | ICD-10-CM | POA: Diagnosis not present

## 2017-01-28 DIAGNOSIS — M545 Low back pain: Secondary | ICD-10-CM | POA: Diagnosis not present

## 2017-01-28 DIAGNOSIS — R296 Repeated falls: Secondary | ICD-10-CM | POA: Diagnosis not present

## 2017-01-28 DIAGNOSIS — M25651 Stiffness of right hip, not elsewhere classified: Secondary | ICD-10-CM | POA: Diagnosis not present

## 2017-01-28 DIAGNOSIS — M25652 Stiffness of left hip, not elsewhere classified: Secondary | ICD-10-CM | POA: Diagnosis not present

## 2017-01-28 NOTE — Therapy (Signed)
[place] [address] [place], [place], [address] Phone: [phone]   Fax:  [phone]  Physical Therapy Treatment  Patient Details  Name: Keith [name] MRN: [id number] Date of Birth: 1941/01/27 Referring Provider: Osborne Casco  Encounter Date: 01/28/2017      PT End of Session - 01/28/17 1519    Visit Number 25   Date for PT Re-Evaluation 02/01/17   PT Start Time 1400   PT Stop Time 1450   PT Time Calculation (min) 50 min   Activity Tolerance Patient limited by pain;Patient limited by fatigue   Behavior During Therapy [facility] for tasks assessed/performed      Past Medical History:  Diagnosis Date  . Cirrhosis of liver ([place])   . Diabetes mellitus   . Hepatic cancer ([place])   . Hypertension   . Prostate hypertrophy   . Stroke ([place])   . Varicose veins     Past Surgical History:  Procedure Laterality Date  . APPENDECTOMY    . EYE SURGERY     bilateral cataract   . left arm surgery     age 46  . left hand surgery     tendons ripped on left hand  . LIVER RESECTION    . TONSILLECTOMY    . TOTAL KNEE ARTHROPLASTY Left 12/14/2012   Procedure: LEFT TOTAL KNEE ARTHROPLASTY;  Surgeon: Gearlean Alf, MD;  [facility];  Service: Orthopedics;  Laterality: Left;    There were no vitals filed for this visit.      Subjective Assessment - 01/28/17 1515    Subjective Patient reports that he had a sleep apnea test last night and is fatigued and tired   Currently in Pain? Yes   Pain Score 7    Pain Location Leg   Pain Orientation Right;Left;Upper                         [facility] Adult PT Treatment/Exercise - 01/28/17 0001      Lumbar Exercises: Aerobic   UBE (Upper Arm Bike) Level 3 x 4 minutes     Lumbar Exercises: Machines for Strengthening   Other Lumbar Machine Exercise green tband scapular stabilization, green tband seated hip abduction, ball b/n knees squeeze     Lumbar Exercises:  Seated   Long Arc Quad on Chair Strengthening;Right;Left;2 sets;10 reps;Weights   LAQ on Chair Weights (lbs) 2     Manual Therapy   Manual Therapy Soft tissue mobilization   Manual therapy comments STM of the quads and ITB and into the calves   Soft tissue mobilization PROM of the LE's in sitting   Other Manual Therapy STM to the upper traps and rhomboids and left thigh                    PT Short Term Goals - 10/02/16 1514      PT SHORT TERM GOAL #1   Title patient to be independent with initial HEP    Time 2   Period Weeks   Status Achieved           PT Long Term Goals - 01/23/17 1408      PT LONG TERM GOAL #1   Title Patient to improve TUG score to 20 seconds    Status Partially Met     PT LONG TERM GOAL #3   Title increase LE strength to 4+/5   Status Partially Met  Plan - 01/28/17 1519    Clinical Impression Statement Patient reports fatigue after sleep study last night, walking with less assistance, less loss of balance, less using the walls for support   PT Next Visit Plan continue to add exercises as tolerated, could try balance activities   Consulted and Agree with Plan of Care Patient      Patient will benefit from skilled therapeutic intervention in order to improve the following deficits and impairments:  Abnormal gait, Decreased activity tolerance, Decreased mobility, Decreased strength, Decreased endurance, Decreased range of motion, Difficulty walking, Impaired flexibility, Pain  Visit Diagnosis: Muscle weakness (generalized)  Difficulty in walking, not elsewhere classified  Chronic low back pain, unspecified back pain laterality, with sciatica presence unspecified  Repeated falls     Problem List Patient Active Problem List   Diagnosis Date Noted  . Varicose veins of left lower extremity with complications 08/56/9437  . Knee pain, acute 01/26/2013  . Constipation 12/22/2012  . Neuropathy 12/22/2012  .  Prostate hypertrophy 12/22/2012  . Acute blood loss anemia 12/22/2012  . Hyperlipidemia 12/22/2012  . Diabetes ([place]) 12/17/2012  . GERD (gastroesophageal reflux disease) 12/17/2012  . Hypertension 12/17/2012  . Thrombocytopenia, unspecified ([place]) 12/17/2012  . Postoperative anemia due to acute blood loss 12/15/2012  . Hyponatremia 12/15/2012  . OA (osteoarthritis) of knee 12/14/2012    [name], PT 01/28/2017, 3:21 PM  [address] [place], [place], [address] Phone: [phone]   Fax:  [phone]  Name: Keith [name] MRN: [id number] Date of Birth: October 23, 1940

## 2017-01-30 ENCOUNTER — Ambulatory Visit: Payer: Medicare Other | Admitting: Physical Therapy

## 2017-01-30 ENCOUNTER — Encounter: Payer: Self-pay | Admitting: Physical Therapy

## 2017-01-30 DIAGNOSIS — M545 Low back pain: Secondary | ICD-10-CM | POA: Diagnosis not present

## 2017-01-30 DIAGNOSIS — R296 Repeated falls: Secondary | ICD-10-CM | POA: Diagnosis not present

## 2017-01-30 DIAGNOSIS — M25651 Stiffness of right hip, not elsewhere classified: Secondary | ICD-10-CM | POA: Diagnosis not present

## 2017-01-30 DIAGNOSIS — M6281 Muscle weakness (generalized): Secondary | ICD-10-CM | POA: Diagnosis not present

## 2017-01-30 DIAGNOSIS — M25652 Stiffness of left hip, not elsewhere classified: Secondary | ICD-10-CM | POA: Diagnosis not present

## 2017-01-30 DIAGNOSIS — R262 Difficulty in walking, not elsewhere classified: Secondary | ICD-10-CM

## 2017-01-30 DIAGNOSIS — G8929 Other chronic pain: Secondary | ICD-10-CM | POA: Diagnosis not present

## 2017-01-30 NOTE — Therapy (Signed)
[place] [address] [place], [place], [address] Phone: [phone]   Fax:  [phone]  Physical Therapy Treatment  Patient Details  Name: Keith [name] MRN: [id number] Date of Birth: Jul 28, 1940 Referring Provider: Osborne Casco  Encounter Date: 01/30/2017      PT End of Session - 01/30/17 1439    Visit Number 26   Date for PT Re-Evaluation 02/01/17   PT Start Time 7062   PT Stop Time 1400   PT Time Calculation (min) 45 min   Activity Tolerance Patient limited by pain;Patient limited by fatigue   Behavior During Therapy [facility] for tasks assessed/performed      Past Medical History:  Diagnosis Date  . Cirrhosis of liver ([place])   . Diabetes mellitus   . Hepatic cancer ([place])   . Hypertension   . Prostate hypertrophy   . Stroke ([place])   . Varicose veins     Past Surgical History:  Procedure Laterality Date  . APPENDECTOMY    . EYE SURGERY     bilateral cataract   . left arm surgery     age 61  . left hand surgery     tendons ripped on left hand  . LIVER RESECTION    . TONSILLECTOMY    . TOTAL KNEE ARTHROPLASTY Left 12/14/2012   Procedure: LEFT TOTAL KNEE ARTHROPLASTY;  Surgeon: Gearlean Alf, MD;  [facility];  Service: Orthopedics;  Laterality: Left;    There were no vitals filed for this visit.      Subjective Assessment - 01/30/17 1321    Subjective Reports that [place] doing the stretching of the legs really caused him to be sore.   Currently in Pain? Yes   Pain Score 7    Pain Location Leg   Pain Orientation Right;Left;Posterior                         [facility] Adult PT Treatment/Exercise - 01/30/17 0001      Lumbar Exercises: Aerobic   UBE (Upper Arm Bike) Level 3 x 4 minutes     Lumbar Exercises: Machines for Strengthening   Other Lumbar Machine Exercise green tband scapular stabilization, green tband seated hip abduction, ball b/n knees squeeze     Lumbar Exercises:  Seated   Long Arc Quad on Chair Strengthening;Right;Left;2 sets;10 reps;Weights   LAQ on Chair Weights (lbs) 2     Manual Therapy   Manual Therapy Soft tissue mobilization   Manual therapy comments STM of the quads and ITB and into the calves   Soft tissue mobilization PROM of the LE's in sitting   Other Manual Therapy STM to the upper traps and rhomboids and left thigh                    PT Short Term Goals - 10/02/16 1514      PT SHORT TERM GOAL #1   Title patient to be independent with initial HEP    Time 2   Period Weeks   Status Achieved           PT Long Term Goals - 01/23/17 1408      PT LONG TERM GOAL #1   Title Patient to improve TUG score to 20 seconds    Status Partially Met     PT LONG TERM GOAL #3   Title increase LE strength to 4+/5   Status Partially Met  Plan - 01/30/17 1440    Clinical Impression Statement Patient reports that the stretching of his calves and HS caused increased pain and soreness   PT Next Visit Plan write renewal next visit   Consulted and Agree with Plan of Care Patient      Patient will benefit from skilled therapeutic intervention in order to improve the following deficits and impairments:  Abnormal gait, Decreased activity tolerance, Decreased mobility, Decreased strength, Decreased endurance, Decreased range of motion, Difficulty walking, Impaired flexibility, Pain  Visit Diagnosis: Muscle weakness (generalized)  Difficulty in walking, not elsewhere classified  Chronic low back pain, unspecified back pain laterality, with sciatica presence unspecified     Problem List Patient Active Problem List   Diagnosis Date Noted  . Varicose veins of left lower extremity with complications 00/34/9611  . Knee pain, acute 01/26/2013  . Constipation 12/22/2012  . Neuropathy 12/22/2012  . Prostate hypertrophy 12/22/2012  . Acute blood loss anemia 12/22/2012  . Hyperlipidemia 12/22/2012  . Diabetes  ([place]) 12/17/2012  . GERD (gastroesophageal reflux disease) 12/17/2012  . Hypertension 12/17/2012  . Thrombocytopenia, unspecified ([place]) 12/17/2012  . Postoperative anemia due to acute blood loss 12/15/2012  . Hyponatremia 12/15/2012  . OA (osteoarthritis) of knee 12/14/2012    [name], PT 01/30/2017, 2:41 PM  [place] [place], [place], [address] Phone: [phone]   Fax:  [phone]  Name: LEANTHONY RHETT MRN: [id number] Date of Birth: 30-Sep-1940

## 2017-02-04 ENCOUNTER — Ambulatory Visit: Payer: Medicare Other | Admitting: Physical Therapy

## 2017-02-04 DIAGNOSIS — R296 Repeated falls: Secondary | ICD-10-CM

## 2017-02-04 DIAGNOSIS — G8929 Other chronic pain: Secondary | ICD-10-CM | POA: Diagnosis not present

## 2017-02-04 DIAGNOSIS — M25651 Stiffness of right hip, not elsewhere classified: Secondary | ICD-10-CM | POA: Diagnosis not present

## 2017-02-04 DIAGNOSIS — R262 Difficulty in walking, not elsewhere classified: Secondary | ICD-10-CM

## 2017-02-04 DIAGNOSIS — M545 Low back pain: Secondary | ICD-10-CM

## 2017-02-04 DIAGNOSIS — M6281 Muscle weakness (generalized): Secondary | ICD-10-CM

## 2017-02-04 DIAGNOSIS — M25652 Stiffness of left hip, not elsewhere classified: Secondary | ICD-10-CM | POA: Diagnosis not present

## 2017-02-04 NOTE — Therapy (Signed)
[place] [address] [place], [place], [address] Phone: [phone]   Fax:  [phone]  Physical Therapy Treatment  Patient Details  Name: LUTHER [place]INGS MRN: [id number] Date of Birth: [birth year] Referring Provider: Osborne Casco  Encounter Date: 02/04/2017      PT End of Session - 02/04/17 1018    Visit Number 27   Date for PT Re-Evaluation 03/05/17   PT Start Time 0845   PT Stop Time 0925   PT Time Calculation (min) 40 min   Activity Tolerance Patient limited by pain;Patient limited by fatigue   Behavior During Therapy [facility] for tasks assessed/performed      Past Medical History:  Diagnosis Date  . Cirrhosis of liver ([place])   . Diabetes mellitus   . Hepatic cancer ([place])   . Hypertension   . Prostate hypertrophy   . Stroke ([place])   . Varicose veins     Past Surgical History:  Procedure Laterality Date  . APPENDECTOMY    . EYE SURGERY     bilateral cataract   . left arm surgery     age 61  . left hand surgery     tendons ripped on left hand  . LIVER RESECTION    . TONSILLECTOMY    . TOTAL KNEE ARTHROPLASTY Left 12/14/2012   Procedure: LEFT TOTAL KNEE ARTHROPLASTY;  Surgeon: Gearlean Alf, MD;  [facility];  Service: Orthopedics;  Laterality: Left;    There were no vitals filed for this visit.      Subjective Assessment - 02/04/17 1008    Subjective He reports that he has remained sore and tired, he feels that the exercises have been good as he has lost about 10 #   Currently in Pain? Yes   Pain Score 7    Pain Location Leg   Pain Orientation Right;Left                         [facility] Adult PT Treatment/Exercise - 02/04/17 0001      Lumbar Exercises: Aerobic   Stationary Bike NuStep Level 4 x 8 minutes   UBE (Upper Arm Bike) Level 3 x 4 minutes     Lumbar Exercises: Machines for Strengthening   Other Lumbar Machine Exercise green tband scapular stabilization, green  tband seated hip abduction, ball b/n knees squeeze     Manual Therapy   Manual Therapy Soft tissue mobilization   Manual therapy comments STM of the quads and ITB and into the calves   Soft tissue mobilization PROM of the LE's in sitting   Other Manual Therapy STM to the upper traps and rhomboids and left thigh                    PT Short Term Goals - 10/02/16 1514      PT SHORT TERM GOAL #1   Title patient to be independent with initial HEP    Time 2   Period Weeks   Status Achieved           PT Long Term Goals - 02/04/17 1021      PT LONG TERM GOAL #1   Title Patient to improve TUG score to 20 seconds    Status Partially Met     PT LONG TERM GOAL #2   Title Patient to report ability to walk 350 feet without rest or increase in low back pain  Status Partially Met     PT LONG TERM GOAL #3   Title increase LE strength to 4+/5   Status Partially Met     PT LONG TERM GOAL #4   Title be independent with gym exercises   Status On-going               Plan - 02/04/17 1020    Clinical Impression Statement Patient reports that he is losing weight with the exercises, he feels good about that but has continued to have pain that limits what we can do here   PT Frequency 2x / week   PT Duration 4 weeks   PT Treatment/Interventions ADLs/Self Care Home Management;Cryotherapy;Presenter, broadcasting;Therapeutic exercise;Therapeutic activities;Gait training;Stair training;Patient/family education;Manual techniques;Passive range of motion   PT Next Visit Plan We will try to progress exercises and function and address balance as his pain levels allow   Consulted and Agree with Plan of Care Patient      Patient will benefit from skilled therapeutic intervention in order to improve the following deficits and impairments:  Abnormal gait, Decreased activity tolerance, Decreased mobility, Decreased strength, Decreased endurance,  Decreased range of motion, Difficulty walking, Impaired flexibility, Pain  Visit Diagnosis: Muscle weakness (generalized) - Plan: PT plan of care cert/re-cert  Difficulty in walking, not elsewhere classified - Plan: PT plan of care cert/re-cert  Chronic low back pain, unspecified back pain laterality, with sciatica presence unspecified - Plan: PT plan of care cert/re-cert  Repeated falls - Plan: PT plan of care cert/re-cert     Problem List Patient Active Problem List   Diagnosis Date Noted  . Varicose veins of left lower extremity with complications 67/67/2094  . Knee pain, acute 01/26/2013  . Constipation 12/22/2012  . Neuropathy 12/22/2012  . Prostate hypertrophy 12/22/2012  . Acute blood loss anemia 12/22/2012  . Hyperlipidemia 12/22/2012  . Diabetes ([place]) 12/17/2012  . GERD (gastroesophageal reflux disease) 12/17/2012  . Hypertension 12/17/2012  . Thrombocytopenia, unspecified ([place]) 12/17/2012  . Postoperative anemia due to acute blood loss 12/15/2012  . Hyponatremia 12/15/2012  . OA (osteoarthritis) of knee 12/14/2012    [name], PT 02/04/2017, 10:23 AM  [facility] [address] [place], [place], [address] Phone: [phone]   Fax:  [phone]  Name: WINFORD HEHN MRN: [id number] Date of Birth: 1940-11-27

## 2017-02-05 ENCOUNTER — Encounter: Payer: Self-pay | Admitting: Physical Therapy

## 2017-02-05 ENCOUNTER — Ambulatory Visit: Payer: Medicare Other | Admitting: Physical Therapy

## 2017-02-05 DIAGNOSIS — M545 Low back pain: Secondary | ICD-10-CM

## 2017-02-05 DIAGNOSIS — M6281 Muscle weakness (generalized): Secondary | ICD-10-CM

## 2017-02-05 DIAGNOSIS — G8929 Other chronic pain: Secondary | ICD-10-CM | POA: Diagnosis not present

## 2017-02-05 DIAGNOSIS — M25651 Stiffness of right hip, not elsewhere classified: Secondary | ICD-10-CM | POA: Diagnosis not present

## 2017-02-05 DIAGNOSIS — R296 Repeated falls: Secondary | ICD-10-CM | POA: Diagnosis not present

## 2017-02-05 DIAGNOSIS — R262 Difficulty in walking, not elsewhere classified: Secondary | ICD-10-CM | POA: Diagnosis not present

## 2017-02-05 DIAGNOSIS — M25652 Stiffness of left hip, not elsewhere classified: Secondary | ICD-10-CM | POA: Diagnosis not present

## 2017-02-05 NOTE — Therapy (Signed)
[place], [place], [address] Phone: [phone]   Fax:  [phone]  Physical Therapy Treatment  Patient Details  Name: Keith [name] MRN: [id number] Date of Birth: [birth year] Referring Provider: Osborne Casco  Encounter Date: 02/05/2017      PT End of Session - 02/05/17 0846    Visit Number 28   Date for PT Re-Evaluation 03/05/17   PT Start Time 0800   PT Stop Time 0845   PT Time Calculation (min) 45 min   Activity Tolerance Patient limited by pain;Patient limited by fatigue   Behavior During Therapy [facility] for tasks assessed/performed      Past Medical History:  Diagnosis Date  . Cirrhosis of liver ([place])   . Diabetes mellitus   . Hepatic cancer ([place])   . Hypertension   . Prostate hypertrophy   . Stroke ([place])   . Varicose veins     Past Surgical History:  Procedure Laterality Date  . APPENDECTOMY    . EYE SURGERY     bilateral cataract   . left arm surgery     age 78  . left hand surgery     tendons ripped on left hand  . LIVER RESECTION    . TONSILLECTOMY    . TOTAL KNEE ARTHROPLASTY Left 12/14/2012   Procedure: LEFT TOTAL KNEE ARTHROPLASTY;  Surgeon: Gearlean Alf, MD;  [facility];  Service: Orthopedics;  Laterality: Left;    There were no vitals filed for this visit.      Subjective Assessment - 02/05/17 0845    Subjective My wife says I am walking better, I am having less pain   Currently in Pain? Yes   Pain Score 4                          [facility] Adult PT Treatment/Exercise - 02/05/17 0001      Ambulation/Gait   Gait Comments HHA 180 feet x 2     Lumbar Exercises: Aerobic   Stationary Bike NuStep Level 4 x 8 minutes   UBE (Upper Arm Bike) Level 3 x 4 minutes     Lumbar Exercises: Seated   Long Arc Quad on Chair Strengthening;Right;Left;2 sets;10 reps;Weights   LAQ on Chair Weights (lbs) 2     Knee/Hip Exercises: Standing   Hip  Flexion Both;2 sets;10 reps   Hip Flexion Limitations 2#   Hip Abduction Both;2 sets;10 reps   Abduction Limitations 2#     Manual Therapy   Manual Therapy Soft tissue mobilization   Manual therapy comments STM of the quads and ITB and into the calves   Soft tissue mobilization PROM of the LE's in sitting   Other Manual Therapy STM to the upper traps and rhomboids and left thigh                    PT Short Term Goals - 10/02/16 1514      PT SHORT TERM GOAL #1   Title patient to be independent with initial HEP    Time 2   Period Weeks   Status Achieved           PT Long Term Goals - 02/04/17 1021      PT LONG TERM GOAL #1   Title Patient to improve TUG score to 20 seconds    Status Partially Met     PT LONG TERM GOAL #  2   Title Patient to report ability to walk 350 feet without rest or increase in low back pain    Status Partially Met     PT LONG TERM GOAL #3   Title increase LE strength to 4+/5   Status Partially Met     PT LONG TERM GOAL #4   Title be independent with gym exercises   Status On-going               Plan - 02/05/17 1026    Clinical Impression Statement Patient reports that he is walking better, still very sore all over but overall reporting lwss pain   PT Next Visit Plan We will try to progress exercises and function and address balance as his pain levels allow   Consulted and Agree with Plan of Care Patient      Patient will benefit from skilled therapeutic intervention in order to improve the following deficits and impairments:  Abnormal gait, Decreased activity tolerance, Decreased mobility, Decreased strength, Decreased endurance, Decreased range of motion, Difficulty walking, Impaired flexibility, Pain  Visit Diagnosis: Muscle weakness (generalized)  Difficulty in walking, not elsewhere classified  Chronic low back pain, unspecified back pain laterality, with sciatica presence unspecified  Repeated  falls     Problem List Patient Active Problem List   Diagnosis Date Noted  . Varicose veins of left lower extremity with complications 73/56/7014  . Knee pain, acute 01/26/2013  . Constipation 12/22/2012  . Neuropathy 12/22/2012  . Prostate hypertrophy 12/22/2012  . Acute blood loss anemia 12/22/2012  . Hyperlipidemia 12/22/2012  . Diabetes ([place]) 12/17/2012  . GERD (gastroesophageal reflux disease) 12/17/2012  . Hypertension 12/17/2012  . Thrombocytopenia, unspecified ([place]) 12/17/2012  . Postoperative anemia due to acute blood loss 12/15/2012  . Hyponatremia 12/15/2012  . OA (osteoarthritis) of knee 12/14/2012    [name], PT 02/05/2017, 10:28 AM  [place] [address] [place], [place], [address] Phone: [phone]   Fax:  [phone]  Name: Keith [name] MRN: [id number] Date of Birth: November 23, 1940

## 2017-02-06 DIAGNOSIS — I358 Other nonrheumatic aortic valve disorders: Secondary | ICD-10-CM | POA: Diagnosis not present

## 2017-02-06 DIAGNOSIS — K769 Liver disease, unspecified: Secondary | ICD-10-CM | POA: Diagnosis not present

## 2017-02-06 DIAGNOSIS — I10 Essential (primary) hypertension: Secondary | ICD-10-CM | POA: Diagnosis not present

## 2017-02-06 DIAGNOSIS — E11319 Type 2 diabetes mellitus with unspecified diabetic retinopathy without macular edema: Secondary | ICD-10-CM | POA: Diagnosis not present

## 2017-02-11 ENCOUNTER — Ambulatory Visit: Payer: Medicare Other | Attending: Internal Medicine | Admitting: Physical Therapy

## 2017-02-11 ENCOUNTER — Encounter: Payer: Self-pay | Admitting: Physical Therapy

## 2017-02-11 DIAGNOSIS — M6281 Muscle weakness (generalized): Secondary | ICD-10-CM | POA: Diagnosis not present

## 2017-02-11 DIAGNOSIS — G8929 Other chronic pain: Secondary | ICD-10-CM

## 2017-02-11 DIAGNOSIS — M545 Low back pain: Secondary | ICD-10-CM | POA: Diagnosis not present

## 2017-02-11 DIAGNOSIS — R262 Difficulty in walking, not elsewhere classified: Secondary | ICD-10-CM | POA: Insufficient documentation

## 2017-02-11 DIAGNOSIS — R296 Repeated falls: Secondary | ICD-10-CM | POA: Diagnosis not present

## 2017-02-11 NOTE — Therapy (Signed)
[address] [place], [place], [address] Phone: [phone]   Fax:  [phone]  Physical Therapy Treatment  Patient Details  Name: DELVIS KAU MRN: [id number] Date of Birth: Nov 10, 1940 Referring Provider: Osborne Casco   Encounter Date: 02/11/2017  PT End of Session - 02/11/17 1601    Visit Number  29    Date for PT Re-Evaluation  03/05/17    PT Start Time  1440    PT Stop Time  1525    PT Time Calculation (min)  45 min    Activity Tolerance  Patient limited by pain;Patient limited by fatigue    Behavior During Therapy  [facility] for tasks assessed/performed       Past Medical History:  Diagnosis Date  . Cirrhosis of liver ([place])   . Diabetes mellitus   . Hepatic cancer ([place])   . Hypertension   . Prostate hypertrophy   . Stroke ([place])   . Varicose veins     Past Surgical History:  Procedure Laterality Date  . APPENDECTOMY    . EYE SURGERY     bilateral cataract   . left arm surgery     age 8  . left hand surgery     tendons ripped on left hand  . LIVER RESECTION    . TONSILLECTOMY      There were no vitals filed for this visit.  Subjective Assessment - 02/11/17 1518    Subjective  Patient reports that he is still walking a little better, but is having some pain    Currently in Pain?  Yes    Pain Score  5     Pain Location  Back    Pain Orientation  Lower                      [facility] Adult PT Treatment/Exercise - 02/11/17 0001      Ambulation/Gait   Gait Comments  HHA 180 feet x 2      Lumbar Exercises: Aerobic   UBE (Upper Arm Bike)  Level 3 x 4 minutes      Lumbar Exercises: Machines for Strengthening   Other Lumbar Machine Exercise  green tband scapular stabilization, green tband seated hip abduction, ball b/n knees squeeze      Lumbar Exercises: Seated   Long Arc Quad on Chair  Strengthening;Right;Left;2 sets;10 reps;Weights    LAQ on Chair Weights (lbs)  2     LAQ on Chair Limitations  Then we did marching with 2# while seated, also seated had foot propped on a 4" step and had him doing [employer]      Knee/Hip Exercises: Stretches   Passive Hamstring Stretch  4 reps;20 seconds      Manual Therapy   Manual Therapy  Soft tissue mobilization    Manual therapy comments  STM of the quads and ITB and into the calves    Soft tissue mobilization  PROM of the LE's in sitting    Other Manual Therapy  STM to the upper traps and rhomboids and left thigh                 PT Short Term Goals - 10/02/16 1514      PT SHORT TERM GOAL #1   Title  patient to be independent with initial HEP     Time  2    Period  Weeks    Status  Achieved  PT Long Term Goals - 02/11/17 1603      PT LONG TERM GOAL #2   Title  Patient to report ability to walk 350 feet without rest or increase in low back pain     Status  Partially Met      PT LONG TERM GOAL #3   Title  increase LE strength to 4+/5    Status  Achieved            Plan - 02/11/17 1601    Clinical Impression Statement  Patient has some ups and downs this week, he reports some increased low back soreness.  He reports he feels off balance today    PT Next Visit Plan  We will try to progress exercises and function and address balance as his pain levels allow    Consulted and Agree with Plan of Care  Patient       Patient will benefit from skilled therapeutic intervention in order to improve the following deficits and impairments:  Abnormal gait, Decreased activity tolerance, Decreased mobility, Decreased strength, Decreased endurance, Decreased range of motion, Difficulty walking, Impaired flexibility, Pain  Visit Diagnosis: Muscle weakness (generalized)  Difficulty in walking, not elsewhere classified  Chronic low back pain, unspecified back pain laterality, with sciatica presence unspecified  Repeated falls     Problem List Patient Active Problem List   Diagnosis Date Noted   . Varicose veins of left lower extremity with complications 30/08/1100  . Knee pain, acute 01/26/2013  . Constipation 12/22/2012  . Neuropathy 12/22/2012  . Prostate hypertrophy 12/22/2012  . Acute blood loss anemia 12/22/2012  . Hyperlipidemia 12/22/2012  . Diabetes ([place]) 12/17/2012  . GERD (gastroesophageal reflux disease) 12/17/2012  . Hypertension 12/17/2012  . Thrombocytopenia, unspecified ([place]) 12/17/2012  . Postoperative anemia due to acute blood loss 12/15/2012  . Hyponatremia 12/15/2012  . OA (osteoarthritis) of knee 12/14/2012    [name], PT 02/11/2017, 4:04 PM  [address] [place], [place], [address] Phone: [phone]   Fax:  [phone]  Name: ANGELINA NEECE MRN: [id number] Date of Birth: [birth year]

## 2017-02-13 ENCOUNTER — Ambulatory Visit: Payer: Medicare Other | Admitting: Physical Therapy

## 2017-02-13 ENCOUNTER — Encounter: Payer: Self-pay | Admitting: Physical Therapy

## 2017-02-13 DIAGNOSIS — M6281 Muscle weakness (generalized): Secondary | ICD-10-CM | POA: Diagnosis not present

## 2017-02-13 DIAGNOSIS — M545 Low back pain: Secondary | ICD-10-CM

## 2017-02-13 DIAGNOSIS — R262 Difficulty in walking, not elsewhere classified: Secondary | ICD-10-CM

## 2017-02-13 DIAGNOSIS — G8929 Other chronic pain: Secondary | ICD-10-CM

## 2017-02-13 DIAGNOSIS — I5030 Unspecified diastolic (congestive) heart failure: Secondary | ICD-10-CM | POA: Diagnosis not present

## 2017-02-13 DIAGNOSIS — R296 Repeated falls: Secondary | ICD-10-CM

## 2017-02-13 DIAGNOSIS — I4439 Other atrioventricular block: Secondary | ICD-10-CM | POA: Diagnosis not present

## 2017-02-13 DIAGNOSIS — Z45018 Encounter for adjustment and management of other part of cardiac pacemaker: Secondary | ICD-10-CM | POA: Diagnosis not present

## 2017-02-13 NOTE — Therapy (Signed)
[address] [place], [place], [address] Phone: [phone]   Fax:  [phone]  Physical Therapy Treatment  Patient Details  Name: NIKOLAI WILCZAK MRN: [id number] Date of Birth: [birth year] Referring Provider: Osborne Casco   Encounter Date: 02/13/2017  PT End of Session - 02/13/17 1625    Visit Number  30    Date for PT Re-Evaluation  03/05/17    PT Start Time  1440    PT Stop Time  1525    PT Time Calculation (min)  45 min    Activity Tolerance  Patient limited by pain;Patient limited by fatigue    Behavior During Therapy  [facility] for tasks assessed/performed       Past Medical History:  Diagnosis Date  . Cirrhosis of liver ([place])   . Diabetes mellitus   . Hepatic cancer ([place])   . Hypertension   . Prostate hypertrophy   . Stroke ([place])   . Varicose veins     Past Surgical History:  Procedure Laterality Date  . APPENDECTOMY    . EYE SURGERY     bilateral cataract   . left arm surgery     age 75  . left hand surgery     tendons ripped on left hand  . LIVER RESECTION    . TONSILLECTOMY      There were no vitals filed for this visit.  Subjective Assessment - 02/13/17 1620    Subjective  Reports legs and low back are really hurting, reports had to be at [facility] today for the heart doctor and has been up and going for about 7-8 hours.    Currently in Pain?  Yes    Pain Score  6     Pain Location  Back legs                      [facility] Adult PT Treatment/Exercise - 02/13/17 0001      Ambulation/Gait   Gait Comments  HHA 180 feet x 2      Lumbar Exercises: Machines for Strengthening   Other Lumbar Machine Exercise  green tband scapular stabilization, green tband seated hip abduction, ball b/n knees squeeze      Lumbar Exercises: Standing   Other Standing Lumbar Exercises  some postureal exercises with back against the wall      Lumbar Exercises: Seated   Long Arc Quad on Chair   Strengthening;Right;Left;2 sets;10 reps;Weights    LAQ on Chair Weights (lbs)  2      Knee/Hip Exercises: Standing   Hip Flexion  Both;2 sets;10 reps    Hip Abduction  Both;2 sets;10 reps      Manual Therapy   Manual Therapy  Soft tissue mobilization    Manual therapy comments  STM of the quads and ITB and into the calves    Soft tissue mobilization  PROM of the LE's in sitting    Other Manual Therapy  STM to the upper traps and rhomboids and left thigh                 PT Short Term Goals - 10/02/16 1514      PT SHORT TERM GOAL #1   Title  patient to be independent with initial HEP     Time  2    Period  Weeks    Status  Achieved        PT Long Term Goals - 02/11/17  [place] #2   Title  Patient to report ability to walk 350 feet without rest or increase in low back pain     Status  Partially Met      PT LONG TERM GOAL #3   Title  increase LE strength to 4+/5    Status  Achieved            Plan - 02/13/17 1626    Clinical Impression Statement  Patient had a lot of activity today and he was having increased pain and fatigue, he did well walking out but struggled coming in to the building but he had been in the car for a long time coming from [place].    PT Next Visit Plan  We will try to progress exercises and function and address balance as his pain levels allow    Consulted and Agree with Plan of Care  Patient       Patient will benefit from skilled therapeutic intervention in order to improve the following deficits and impairments:     Visit Diagnosis: Muscle weakness (generalized)  Difficulty in walking, not elsewhere classified  Chronic low back pain, unspecified back pain laterality, with sciatica presence unspecified  Repeated falls     Problem List Patient Active Problem List   Diagnosis Date Noted  . Varicose veins of left lower extremity with complications 50/12/3816  . Knee pain, acute 01/26/2013  . Constipation  12/22/2012  . Neuropathy 12/22/2012  . Prostate hypertrophy 12/22/2012  . Acute blood loss anemia 12/22/2012  . Hyperlipidemia 12/22/2012  . Diabetes ([place]) 12/17/2012  . GERD (gastroesophageal reflux disease) 12/17/2012  . Hypertension 12/17/2012  . Thrombocytopenia, unspecified ([place]) 12/17/2012  . Postoperative anemia due to acute blood loss 12/15/2012  . Hyponatremia 12/15/2012  . OA (osteoarthritis) of knee 12/14/2012    [name], PT 02/13/2017, 4:34 PM  [address] [place], [place], [address] Phone: [phone]   Fax:  [phone]  Name: WENDY MIKLES MRN: [id number] Date of Birth: 01/20/1941

## 2017-02-18 ENCOUNTER — Encounter: Payer: Self-pay | Admitting: Physical Therapy

## 2017-02-18 ENCOUNTER — Ambulatory Visit: Payer: Medicare Other | Admitting: Physical Therapy

## 2017-02-18 DIAGNOSIS — M545 Low back pain: Secondary | ICD-10-CM | POA: Diagnosis not present

## 2017-02-18 DIAGNOSIS — G8929 Other chronic pain: Secondary | ICD-10-CM

## 2017-02-18 DIAGNOSIS — R296 Repeated falls: Secondary | ICD-10-CM | POA: Diagnosis not present

## 2017-02-18 DIAGNOSIS — R262 Difficulty in walking, not elsewhere classified: Secondary | ICD-10-CM | POA: Diagnosis not present

## 2017-02-18 DIAGNOSIS — M6281 Muscle weakness (generalized): Secondary | ICD-10-CM | POA: Diagnosis not present

## 2017-02-18 NOTE — Therapy (Signed)
[address] [place], [place], [address] Phone: [phone]   Fax:  [phone]  Physical Therapy Treatment  Patient Details  Name: Keith [name] MRN: [id number] Date of Birth: 13-Mar-1941 Referring Provider: Osborne Casco   Encounter Date: 02/18/2017  PT End of Session - 02/18/17 1503    Visit Number  31    Date for PT Re-Evaluation  03/05/17    PT Start Time  1400    PT Stop Time  5456    PT Time Calculation (min)  45 min    Activity Tolerance  Patient limited by pain;Patient limited by fatigue    Behavior During Therapy  [facility] for tasks assessed/performed       Past Medical History:  Diagnosis Date  . Cirrhosis of liver ([place])   . Diabetes mellitus   . Hepatic cancer ([place])   . Hypertension   . Prostate hypertrophy   . Stroke ([place])   . Varicose veins     Past Surgical History:  Procedure Laterality Date  . APPENDECTOMY    . EYE SURGERY     bilateral cataract   . left arm surgery     age 70  . left hand surgery     tendons ripped on left hand  . LIVER RESECTION    . TONSILLECTOMY      There were no vitals filed for this visit.  Subjective Assessment - 02/18/17 1500    Subjective  Patient doing better today, reports that he has had less stumbles and less use of walls at home for balance    Currently in Pain?  Yes    Pain Score  6     Pain Location  Back legs    Pain Descriptors / Indicators  Tightness                      [facility] Adult PT Treatment/Exercise - 02/18/17 0001      Ambulation/Gait   Gait Comments  HHA 180 feet x 2      Lumbar Exercises: Aerobic   Stationary Bike  NuStep Level 4 x 8 minutes    UBE (Upper Arm Bike)  Level 3 x 4 minutes      Lumbar Exercises: Machines for Strengthening   Other Lumbar Machine Exercise  green tband scapular stabilization, green tband seated hip abduction, ball b/n knees squeeze      Manual Therapy   Manual Therapy  Soft  tissue mobilization    Manual therapy comments  STM of the quads and ITB and into the calves    Soft tissue mobilization  PROM of the LE's in sitting    Other Manual Therapy  STM to the upper traps and rhomboids and left thigh                 PT Short Term Goals - 10/02/16 1514      PT SHORT TERM GOAL #1   Title  patient to be independent with initial HEP     Time  2    Period  Weeks    Status  Achieved        PT Long Term Goals - 02/11/17 1603      PT LONG TERM GOAL #2   Title  Patient to report ability to walk 350 feet without rest or increase in low back pain     Status  Partially Met      PT  LONG TERM GOAL #3   Title  increase LE strength to 4+/5    Status  Achieved            Plan - 02/18/17 1507    Clinical Impression Statement  Patient able to tolerate some exercises, his quads and ITB as well as calves are very tight and tender.  Used the walls much less for balance as he walked out to the car    PT Next Visit Plan  we may need to educate on independent gym use    Consulted and Agree with Plan of Care  Patient       Patient will benefit from skilled therapeutic intervention in order to improve the following deficits and impairments:  Abnormal gait, Decreased activity tolerance, Decreased mobility, Decreased strength, Decreased endurance, Decreased range of motion, Difficulty walking, Impaired flexibility, Pain  Visit Diagnosis: Muscle weakness (generalized)  Difficulty in walking, not elsewhere classified  Chronic low back pain, unspecified back pain laterality, with sciatica presence unspecified  Repeated falls     Problem List Patient Active Problem List   Diagnosis Date Noted  . Varicose veins of left lower extremity with complications 66/59/9357  . Knee pain, acute 01/26/2013  . Constipation 12/22/2012  . Neuropathy 12/22/2012  . Prostate hypertrophy 12/22/2012  . Acute blood loss anemia 12/22/2012  . Hyperlipidemia 12/22/2012  .  Diabetes ([place]) 12/17/2012  . GERD (gastroesophageal reflux disease) 12/17/2012  . Hypertension 12/17/2012  . Thrombocytopenia, unspecified ([place]) 12/17/2012  . Postoperative anemia due to acute blood loss 12/15/2012  . Hyponatremia 12/15/2012  . OA (osteoarthritis) of knee 12/14/2012    [name], PT 02/18/2017, 3:11 PM  [address] [place], [place], [address] Phone: [phone]   Fax:  [phone]  Name: Keith [name] MRN: [id number] Date of Birth: [birth year]

## 2017-02-20 ENCOUNTER — Encounter: Payer: Self-pay | Admitting: Physical Therapy

## 2017-02-20 ENCOUNTER — Ambulatory Visit: Payer: Medicare Other | Admitting: Physical Therapy

## 2017-02-20 DIAGNOSIS — M6281 Muscle weakness (generalized): Secondary | ICD-10-CM | POA: Diagnosis not present

## 2017-02-20 DIAGNOSIS — G8929 Other chronic pain: Secondary | ICD-10-CM

## 2017-02-20 DIAGNOSIS — M545 Low back pain: Secondary | ICD-10-CM

## 2017-02-20 DIAGNOSIS — R262 Difficulty in walking, not elsewhere classified: Secondary | ICD-10-CM

## 2017-02-20 DIAGNOSIS — R296 Repeated falls: Secondary | ICD-10-CM

## 2017-02-20 NOTE — Therapy (Signed)
[address] [place], [place], [address] Phone: [phone]   Fax:  [phone]  Physical Therapy Treatment  Patient Details  Name: Keith [name] MRN: [id number] Date of Birth: 1940/08/24 Referring Provider: Osborne Casco   Encounter Date: 02/20/2017  PT End of Session - 02/20/17 1502    Visit Number  32    Date for PT Re-Evaluation  03/05/17    PT Start Time  1400    PT Stop Time  1450    PT Time Calculation (min)  50 min    Activity Tolerance  Patient limited by pain;Patient limited by fatigue    Behavior During Therapy  [facility] for tasks assessed/performed       Past Medical History:  Diagnosis Date  . Cirrhosis of liver ([place])   . Diabetes mellitus   . Hepatic cancer ([place])   . Hypertension   . Prostate hypertrophy   . Stroke ([place])   . Varicose veins     Past Surgical History:  Procedure Laterality Date  . APPENDECTOMY    . EYE SURGERY     bilateral cataract   . left arm surgery     age 55  . left hand surgery     tendons ripped on left hand  . LIVER RESECTION    . TONSILLECTOMY    . TOTAL KNEE ARTHROPLASTY Left 12/14/2012   Procedure: LEFT TOTAL KNEE ARTHROPLASTY;  Surgeon: Gearlean Alf, MD;  Location: [facility];  Service: Orthopedics;  Laterality: Left;    There were no vitals filed for this visit.  Subjective Assessment - 02/20/17 1459    Subjective  Patient reporting [name] and sore, but reports feeling better after the treatment last time    Currently in Pain?  Yes    Pain Score  4     Pain Location  Back very tight quads and ITB    Pain Orientation  Lower                      [facility] Adult PT Treatment/Exercise - 02/20/17 0001      Lumbar Exercises: Aerobic   Stationary Bike  NuStep Level 4 x 8 minutes      Lumbar Exercises: Machines for Strengthening   Other Lumbar Machine Exercise  green tband scapular stabilization, green tband seated hip abduction, ball b/n  knees squeeze, green tband bicep curls, tricep press      Lumbar Exercises: Standing   Other Standing Lumbar Exercises  back agains wall weighted ball raise to chest high      Knee/Hip Exercises: Standing   Hip Flexion  Both;2 sets;10 reps      Manual Therapy   Manual Therapy  Soft tissue mobilization    Manual therapy comments  STM of the quads and ITB and into the calves    Other Manual Therapy  STM to the upper traps and rhomboids and left thigh                 PT Short Term Goals - 10/02/16 1514      PT SHORT TERM GOAL #1   Title  patient to be independent with initial HEP     Time  2    Period  Weeks    Status  Achieved        PT Long Term Goals - 02/20/17 1505      PT LONG TERM GOAL #1   Title  Patient to improve TUG score to 20 seconds     Status  Partially Met      PT LONG TERM GOAL #2   Title  Patient to report ability to walk 350 feet without rest or increase in low back pain     Status  Partially Met      PT LONG TERM GOAL #3   Title  increase LE strength to 4+/5      PT LONG TERM GOAL #4   Title  be independent with gym exercises    Status  Partially Met            Plan - 02/20/17 1502    Clinical Impression Statement  started talking to patient about doing exercises on his own at home or here in our gym, he is unsure if he can do here and has worries about the pacemaker and what he should not do, we started talking about this and started some of the exercises that he could try at home    PT Next Visit Plan  continue to educate on gym or advanced HEP    Consulted and Agree with Plan of Care  Patient       Patient will benefit from skilled therapeutic intervention in order to improve the following deficits and impairments:  Abnormal gait, Decreased activity tolerance, Decreased mobility, Decreased strength, Decreased endurance, Decreased range of motion, Difficulty walking, Impaired flexibility, Pain  Visit Diagnosis: Muscle weakness  (generalized)  Difficulty in walking, not elsewhere classified  Chronic low back pain, unspecified back pain laterality, with sciatica presence unspecified  Repeated falls     Problem List Patient Active Problem List   Diagnosis Date Noted  . Varicose veins of left lower extremity with complications 32/44/0102  . Knee pain, acute 01/26/2013  . Constipation 12/22/2012  . Neuropathy 12/22/2012  . Prostate hypertrophy 12/22/2012  . Acute blood loss anemia 12/22/2012  . Hyperlipidemia 12/22/2012  . Diabetes ([place]) 12/17/2012  . GERD (gastroesophageal reflux disease) 12/17/2012  . Hypertension 12/17/2012  . Thrombocytopenia, unspecified ([place]) 12/17/2012  . Postoperative anemia due to acute blood loss 12/15/2012  . Hyponatremia 12/15/2012  . OA (osteoarthritis) of knee 12/14/2012    [name], PT 02/20/2017, 3:08 PM  [address] [address] [place], [place], [address] Phone: [phone]   Fax:  [phone]  Name: Keith [name] MRN: [id number] Date of Birth: 1940-09-21

## 2017-02-25 ENCOUNTER — Encounter: Payer: Self-pay | Admitting: Physical Therapy

## 2017-02-25 ENCOUNTER — Ambulatory Visit: Payer: Medicare Other | Admitting: Physical Therapy

## 2017-02-25 DIAGNOSIS — R296 Repeated falls: Secondary | ICD-10-CM | POA: Diagnosis not present

## 2017-02-25 DIAGNOSIS — R262 Difficulty in walking, not elsewhere classified: Secondary | ICD-10-CM

## 2017-02-25 DIAGNOSIS — M6281 Muscle weakness (generalized): Secondary | ICD-10-CM

## 2017-02-25 DIAGNOSIS — G8929 Other chronic pain: Secondary | ICD-10-CM | POA: Diagnosis not present

## 2017-02-25 DIAGNOSIS — M545 Low back pain: Secondary | ICD-10-CM | POA: Diagnosis not present

## 2017-02-25 NOTE — Therapy (Signed)
[address] [place], [place], [address] Phone: [phone]   Fax:  [phone]  Physical Therapy Treatment  Patient Details  Name: Keith [name] MRN: [id number] Date of Birth: 10/10/1940 Referring Provider: Osborne Casco   Encounter Date: 02/25/2017  PT End of Session - 02/25/17 1602    Visit Number  33    Date for PT Re-Evaluation  03/05/17    PT Start Time  2683    PT Stop Time  1346    PT Time Calculation (min)  31 min    Activity Tolerance  Patient limited by pain;Patient limited by fatigue    Behavior During Therapy  [facility] for tasks assessed/performed       Past Medical History:  Diagnosis Date  . Cirrhosis of liver ([place])   . Diabetes mellitus   . Hepatic cancer ([place])   . Hypertension   . Prostate hypertrophy   . Stroke ([place])   . Varicose veins     Past Surgical History:  Procedure Laterality Date  . APPENDECTOMY    . EYE SURGERY     bilateral cataract   . left arm surgery     age 76  . left hand surgery     tendons ripped on left hand  . LIVER RESECTION    . TONSILLECTOMY    . TOTAL KNEE ARTHROPLASTY Left 12/14/2012   Procedure: LEFT TOTAL KNEE ARTHROPLASTY;  Surgeon: Gearlean Alf, MD;  Location: [facility];  Service: Orthopedics;  Laterality: Left;    There were no vitals filed for this visit.  Subjective Assessment - 02/25/17 1551    Subjective  Patient reports dizziness today, not walking well, vertigo     Currently in Pain?  Yes    Pain Score  6     Pain Location  Back    Pain Orientation  Lower    Aggravating Factors   sitting, standing                      [facility] Adult PT Treatment/Exercise - 02/25/17 0001      Ambulation/Gait   Gait Comments  HHA 180 feet x 2      Lumbar Exercises: Aerobic   Stationary Bike  NuStep Level 4 x 8 minutes    UBE (Upper Arm Bike)  Level 3 x 4 minutes      Lumbar Exercises: Machines for Strengthening   Other Lumbar Machine  Exercise  green tband scapular stabilization, green tband seated hip abduction, ball b/n knees squeeze, green tband bicep curls, tricep press      Manual Therapy   Manual Therapy  Soft tissue mobilization    Manual therapy comments  STM of the quads and ITB and into the calves    Soft tissue mobilization  PROM of the LE's in sitting    Other Manual Therapy  STM to the upper traps and rhomboids and left thigh                 PT Short Term Goals - 10/02/16 1514      PT SHORT TERM GOAL #1   Title  patient to be independent with initial HEP     Time  2    Period  Weeks    Status  Achieved        PT Long Term Goals - 02/20/17 1505      PT LONG TERM GOAL #1   Title  Patient to improve TUG score to 20 seconds     Status  Partially Met      PT LONG TERM GOAL #2   Title  Patient to report ability to walk 350 feet without rest or increase in low back pain     Status  Partially Met      PT LONG TERM GOAL #3   Title  increase LE strength to 4+/5      PT LONG TERM GOAL #4   Title  be independent with gym exercises    Status  Partially Met            Plan - 02/25/17 1603    Clinical Impression Statement  Again working on him being able to do exercises on his own, he is continuing to have knee and back pain and today is having balance issues.    PT Next Visit Plan  continue to educate on gym or advanced HEP    Consulted and Agree with Plan of Care  Patient       Patient will benefit from skilled therapeutic intervention in order to improve the following deficits and impairments:  Abnormal gait, Decreased activity tolerance, Decreased mobility, Decreased strength, Decreased endurance, Decreased range of motion, Difficulty walking, Impaired flexibility, Pain  Visit Diagnosis: Muscle weakness (generalized)  Difficulty in walking, not elsewhere classified  Chronic low back pain, unspecified back pain laterality, with sciatica presence unspecified  Repeated  falls     Problem List Patient Active Problem List   Diagnosis Date Noted  . Varicose veins of left lower extremity with complications 21/94/7125  . Knee pain, acute 01/26/2013  . Constipation 12/22/2012  . Neuropathy 12/22/2012  . Prostate hypertrophy 12/22/2012  . Acute blood loss anemia 12/22/2012  . Hyperlipidemia 12/22/2012  . Diabetes ([place]) 12/17/2012  . GERD (gastroesophageal reflux disease) 12/17/2012  . Hypertension 12/17/2012  . Thrombocytopenia, unspecified ([place]) 12/17/2012  . Postoperative anemia due to acute blood loss 12/15/2012  . Hyponatremia 12/15/2012  . OA (osteoarthritis) of knee 12/14/2012    [name], PT 02/25/2017, 4:04 PM  [place] [address] [place], [place], [address] Phone: [phone]   Fax:  [phone]  Name: Keith [name] MRN: [id number] Date of Birth: November 26, 1940

## 2017-03-04 ENCOUNTER — Encounter: Payer: Self-pay | Admitting: Physical Therapy

## 2017-03-04 ENCOUNTER — Ambulatory Visit: Payer: Medicare Other | Admitting: Physical Therapy

## 2017-03-04 DIAGNOSIS — G8929 Other chronic pain: Secondary | ICD-10-CM | POA: Diagnosis not present

## 2017-03-04 DIAGNOSIS — R262 Difficulty in walking, not elsewhere classified: Secondary | ICD-10-CM | POA: Diagnosis not present

## 2017-03-04 DIAGNOSIS — M545 Low back pain: Secondary | ICD-10-CM

## 2017-03-04 DIAGNOSIS — M6281 Muscle weakness (generalized): Secondary | ICD-10-CM | POA: Diagnosis not present

## 2017-03-04 DIAGNOSIS — R296 Repeated falls: Secondary | ICD-10-CM

## 2017-03-04 NOTE — Therapy (Signed)
[place], [place], [address] Phone: [phone]   Fax:  [phone]  Physical Therapy Treatment  Patient Details  Name: Keith [name] MRN: [id number] Date of Birth: [birth year] Referring Provider: Osborne Casco   Encounter Date: 03/04/2017  PT End of Session - 03/04/17 1405    Visit Number  34    PT Start Time  6712    PT Stop Time  1403    PT Time Calculation (min)  48 min    Activity Tolerance  Patient tolerated treatment well    Behavior During Therapy  [facility] for tasks assessed/performed       Past Medical History:  Diagnosis Date  . Cirrhosis of liver ([place])   . Diabetes mellitus   . Hepatic cancer ([place])   . Hypertension   . Prostate hypertrophy   . Stroke ([place])   . Varicose veins     Past Surgical History:  Procedure Laterality Date  . APPENDECTOMY    . EYE SURGERY     bilateral cataract   . left arm surgery     age 30  . left hand surgery     tendons ripped on left hand  . LIVER RESECTION    . TONSILLECTOMY    . TOTAL KNEE ARTHROPLASTY Left 12/14/2012   Procedure: LEFT TOTAL KNEE ARTHROPLASTY;  Surgeon: Gearlean Alf, MD;  Location: [facility];  Service: Orthopedics;  Laterality: Left;    There were no vitals filed for this visit.  Subjective Assessment - 03/04/17 1401    Subjective  Patient reports that he has been walking better, no falls, or stumbles    Currently in Pain?  Yes    Pain Score  4     Pain Location  Back    Pain Orientation  Lower         [facility] PT Assessment - 03/04/17 0001      Strength   Right Hip Flexion  4+/5    Right Hip Extension  4+/5    Left Hip Flexion  4+/5    Left Hip Extension  4+/5    Right Knee Flexion  4+/5    Right Knee Extension  4+/5    Left Knee Flexion  4+/5    Left Knee Extension  4+/5    Right Ankle Dorsiflexion  4+/5    Left Ankle Dorsiflexion  4+/5      Timed Up and Go Test   Normal TUG (seconds)  25                   [facility] Adult PT Treatment/Exercise - 03/04/17 0001      Ambulation/Gait   Gait Comments  HHA 180 feet x 2      Self-Care   Self-Care  Other Self-Care Comments    Other Self-Care Comments   went over with patient the exercises that he could do at home and in the gym here on his own.  I went over how to adjust machines and how to operate, his biggest issue he reports to me is being able to walk down the hall to the ckinic on his own      Lumbar Exercises: Aerobic   Stationary Bike  NuStep Level 4 x 9 minutes    UBE (Upper Arm Bike)  Level 3 x 6 minutes      Lumbar Exercises: Machines for Strengthening   Other Lumbar Machine Exercise  green tband scapular  stabilization, green tband seated hip abduction, ball b/n knees squeeze, green tband bicep curls, tricep press               PT Short Term Goals - 10/02/16 1514      PT SHORT TERM GOAL #1   Title  patient to be independent with initial HEP     Time  2    Period  Weeks    Status  Achieved        PT Long Term Goals - 03-17-17 1407      PT LONG TERM GOAL #1   Title  Patient to improve TUG score to 20 seconds     Status  Partially Met      PT LONG TERM GOAL #2   Title  Patient to report ability to walk 350 feet without rest or increase in low back pain     Status  Partially Met      PT LONG TERM GOAL #3   Title  increase LE strength to 4+/5    Status  Achieved      PT LONG TERM GOAL #4   Title  be independent with gym exercises    Status  Achieved            Plan - 17-Mar-2017 1406    Clinical Impression Statement  Patient reports he thinks he can operate the machines but reports that he is worried about walking down the hall to the  clinic on his own as he does have difficulty walking.  He is walking better and faster today with less unsteadiness    PT Next Visit Plan  will d/c with him trying exercises on his own    Consulted and Agree with Plan of Care  Patient       Patient  will benefit from skilled therapeutic intervention in order to improve the following deficits and impairments:  Abnormal gait, Decreased activity tolerance, Decreased mobility, Decreased strength, Decreased endurance, Decreased range of motion, Difficulty walking, Impaired flexibility, Pain  Visit Diagnosis: Muscle weakness (generalized)  Difficulty in walking, not elsewhere classified  Chronic low back pain, unspecified back pain laterality, with sciatica presence unspecified  Repeated falls   G-Codes - 2017-03-17 1410    Functional Assessment Tool Used (Outpatient Only)  foto 49% limited    Functional Limitation  Mobility: Walking and moving around    Mobility: Walking and Moving Around Current Status [phone])  At least 40 percent but less than 60 percent impaired, limited or restricted    Mobility: Walking and Moving Around Goal Status [phone])  At least 40 percent but less than 60 percent impaired, limited or restricted    Mobility: Walking and Moving Around Discharge Status [phone])  At least 40 percent but less than 60 percent impaired, limited or restricted       Problem List Patient Active Problem List   Diagnosis Date Noted  . Varicose veins of left lower extremity with complications 91/47/8295  . Knee pain, acute 01/26/2013  . Constipation 12/22/2012  . Neuropathy 12/22/2012  . Prostate hypertrophy 12/22/2012  . Acute blood loss anemia 12/22/2012  . Hyperlipidemia 12/22/2012  . Diabetes ([place]) 12/17/2012  . GERD (gastroesophageal reflux disease) 12/17/2012  . Hypertension 12/17/2012  . Thrombocytopenia, unspecified ([place]) 12/17/2012  . Postoperative anemia due to acute blood loss 12/15/2012  . Hyponatremia 12/15/2012  . OA (osteoarthritis) of knee 12/14/2012  D/C plateau in function  ALBRIGHT,MICHAEL W 03/17/2017, 2:11 PM  [place]  [place], [place], [address] Phone: [phone]   Fax:   [phone]  Name: TRAVIS MASTEL MRN: [id number] Date of Birth: February 13, 1941

## 2017-03-10 ENCOUNTER — Encounter: Payer: Self-pay | Admitting: Neurology

## 2017-04-16 DIAGNOSIS — K7581 Nonalcoholic steatohepatitis (NASH): Secondary | ICD-10-CM | POA: Diagnosis not present

## 2017-04-16 DIAGNOSIS — Z950 Presence of cardiac pacemaker: Secondary | ICD-10-CM | POA: Diagnosis not present

## 2017-04-16 DIAGNOSIS — I517 Cardiomegaly: Secondary | ICD-10-CM | POA: Diagnosis not present

## 2017-04-16 DIAGNOSIS — K7460 Unspecified cirrhosis of liver: Secondary | ICD-10-CM | POA: Diagnosis not present

## 2017-04-16 DIAGNOSIS — Z8505 Personal history of malignant neoplasm of liver: Secondary | ICD-10-CM | POA: Diagnosis not present

## 2017-04-16 DIAGNOSIS — C220 Liver cell carcinoma: Secondary | ICD-10-CM | POA: Diagnosis not present

## 2017-04-16 DIAGNOSIS — K7290 Hepatic failure, unspecified without coma: Secondary | ICD-10-CM | POA: Diagnosis not present

## 2017-04-16 DIAGNOSIS — R918 Other nonspecific abnormal finding of lung field: Secondary | ICD-10-CM | POA: Diagnosis not present

## 2017-04-16 DIAGNOSIS — K766 Portal hypertension: Secondary | ICD-10-CM | POA: Diagnosis not present

## 2017-05-14 DIAGNOSIS — K7581 Nonalcoholic steatohepatitis (NASH): Secondary | ICD-10-CM | POA: Diagnosis not present

## 2017-05-14 DIAGNOSIS — E119 Type 2 diabetes mellitus without complications: Secondary | ICD-10-CM | POA: Diagnosis not present

## 2017-05-14 DIAGNOSIS — Z8673 Personal history of transient ischemic attack (TIA), and cerebral infarction without residual deficits: Secondary | ICD-10-CM | POA: Diagnosis not present

## 2017-05-14 DIAGNOSIS — C220 Liver cell carcinoma: Secondary | ICD-10-CM | POA: Diagnosis not present

## 2017-05-14 DIAGNOSIS — Z888 Allergy status to other drugs, medicaments and biological substances status: Secondary | ICD-10-CM | POA: Diagnosis not present

## 2017-05-14 DIAGNOSIS — Z79899 Other long term (current) drug therapy: Secondary | ICD-10-CM | POA: Diagnosis not present

## 2017-05-14 DIAGNOSIS — Z794 Long term (current) use of insulin: Secondary | ICD-10-CM | POA: Diagnosis not present

## 2017-05-14 DIAGNOSIS — Z885 Allergy status to narcotic agent status: Secondary | ICD-10-CM | POA: Diagnosis not present

## 2017-05-14 DIAGNOSIS — Z87891 Personal history of nicotine dependence: Secondary | ICD-10-CM | POA: Diagnosis not present

## 2017-05-14 DIAGNOSIS — K7460 Unspecified cirrhosis of liver: Secondary | ICD-10-CM | POA: Diagnosis not present

## 2017-05-14 DIAGNOSIS — I2510 Atherosclerotic heart disease of native coronary artery without angina pectoris: Secondary | ICD-10-CM | POA: Diagnosis not present

## 2017-05-15 DIAGNOSIS — I2510 Atherosclerotic heart disease of native coronary artery without angina pectoris: Secondary | ICD-10-CM | POA: Diagnosis not present

## 2017-05-15 DIAGNOSIS — Z87891 Personal history of nicotine dependence: Secondary | ICD-10-CM | POA: Diagnosis not present

## 2017-05-15 DIAGNOSIS — K7460 Unspecified cirrhosis of liver: Secondary | ICD-10-CM | POA: Diagnosis not present

## 2017-05-15 DIAGNOSIS — Z888 Allergy status to other drugs, medicaments and biological substances status: Secondary | ICD-10-CM | POA: Diagnosis not present

## 2017-05-15 DIAGNOSIS — Z885 Allergy status to narcotic agent status: Secondary | ICD-10-CM | POA: Diagnosis not present

## 2017-05-15 DIAGNOSIS — Z79899 Other long term (current) drug therapy: Secondary | ICD-10-CM | POA: Diagnosis not present

## 2017-05-15 DIAGNOSIS — Z91041 Radiographic dye allergy status: Secondary | ICD-10-CM | POA: Diagnosis not present

## 2017-05-15 DIAGNOSIS — K7581 Nonalcoholic steatohepatitis (NASH): Secondary | ICD-10-CM | POA: Diagnosis not present

## 2017-05-15 DIAGNOSIS — Z8673 Personal history of transient ischemic attack (TIA), and cerebral infarction without residual deficits: Secondary | ICD-10-CM | POA: Diagnosis not present

## 2017-05-15 DIAGNOSIS — E119 Type 2 diabetes mellitus without complications: Secondary | ICD-10-CM | POA: Diagnosis not present

## 2017-05-15 DIAGNOSIS — Z794 Long term (current) use of insulin: Secondary | ICD-10-CM | POA: Diagnosis not present

## 2017-05-15 DIAGNOSIS — C220 Liver cell carcinoma: Secondary | ICD-10-CM | POA: Diagnosis not present

## 2017-05-20 DIAGNOSIS — E11319 Type 2 diabetes mellitus with unspecified diabetic retinopathy without macular edema: Secondary | ICD-10-CM | POA: Diagnosis not present

## 2017-05-20 DIAGNOSIS — Z950 Presence of cardiac pacemaker: Secondary | ICD-10-CM | POA: Diagnosis not present

## 2017-05-20 DIAGNOSIS — E7800 Pure hypercholesterolemia, unspecified: Secondary | ICD-10-CM | POA: Diagnosis not present

## 2017-05-20 DIAGNOSIS — M545 Low back pain: Secondary | ICD-10-CM | POA: Diagnosis not present

## 2017-05-29 ENCOUNTER — Encounter: Payer: Self-pay | Admitting: Physical Therapy

## 2017-05-29 ENCOUNTER — Other Ambulatory Visit: Payer: Self-pay

## 2017-05-29 ENCOUNTER — Ambulatory Visit: Payer: Medicare Other | Attending: Internal Medicine | Admitting: Physical Therapy

## 2017-05-29 DIAGNOSIS — G8929 Other chronic pain: Secondary | ICD-10-CM | POA: Insufficient documentation

## 2017-05-29 DIAGNOSIS — M545 Low back pain: Secondary | ICD-10-CM | POA: Diagnosis present

## 2017-05-29 DIAGNOSIS — M6281 Muscle weakness (generalized): Secondary | ICD-10-CM | POA: Diagnosis not present

## 2017-05-29 DIAGNOSIS — R296 Repeated falls: Secondary | ICD-10-CM | POA: Insufficient documentation

## 2017-05-29 DIAGNOSIS — R262 Difficulty in walking, not elsewhere classified: Secondary | ICD-10-CM | POA: Diagnosis present

## 2017-05-29 NOTE — Therapy (Signed)
[place], [place], [address] Phone: [phone]   Fax:  [phone]  Physical Therapy Evaluation  Patient Details  Name: Keith [name] MRN: [id number] Date of Birth: 02/26/1941 Referring Provider: Osborne Casco   Encounter Date: 05/29/2017  PT End of Session - 05/29/17 1556    Visit Number  1    Date for PT Re-Evaluation  07/29/17    PT Start Time  9357    PT Stop Time  1615    PT Time Calculation (min)  45 min    Activity Tolerance  Patient limited by fatigue    Behavior During Therapy  [facility] for tasks assessed/performed       Past Medical History:  Diagnosis Date  . Cirrhosis of liver ([place])   . Diabetes mellitus   . Hepatic cancer ([place])   . Hypertension   . Prostate hypertrophy   . Stroke ([place])   . Varicose veins     Past Surgical History:  Procedure Laterality Date  . APPENDECTOMY    . EYE SURGERY     bilateral cataract   . left arm surgery     age 11  . left hand surgery     tendons ripped on left hand  . LIVER RESECTION    . TONSILLECTOMY    . TOTAL KNEE ARTHROPLASTY Left 12/14/2012   Procedure: LEFT TOTAL KNEE ARTHROPLASTY;  Surgeon: Gearlean Alf, MD;  [facility];  Service: Orthopedics;  Laterality: Left;    There were no vitals filed for this visit.   Subjective Assessment - 05/29/17 1536    Subjective  Patient has a long history of LBP and leg pain, has significant neuropathy og the LE's.  Patient reports that about 6 weeks ago he fell while getting into the car.  He walks in today with a person helping him , holding onto his arm and him using the wall.    Limitations  Standing;Walking    How long can you walk comfortably?  120 feet    Patient Stated Goals  walk better, have no falls, have less pain    Currently in Pain?  Yes    Pain Score  6     Pain Location  Back    Pain Orientation  Lower    Pain Descriptors / Indicators  Aching    Pain Type  Chronic  pain    Pain Onset  More than a month ago    Pain Frequency  Constant    Aggravating Factors   reports standing and walking makes the pain worse, reports also pain will just hit him at times, pain up to 10/10    Pain Relieving Factors  rest    Effect of Pain on Daily Activities  limits everything         [facility] PT Assessment - 05/29/17 0001      Assessment   Medical Diagnosis  difficulty walking, weakness, falls, LBP and leg pain    Referring Provider  Tis[place]c    Onset Date/Surgical Date  05/21/17      Precautions   Precautions  ICD/Pacemaker      Balance Screen   Has the patient fallen in the past 6 months  Yes    How many times?  2    Has the patient had a decrease in activity level because of a fear of falling?   Yes    Is the patient reluctant to leave  their home because of a fear of falling?   Yes      Home Environment   Additional Comments  lives with spouse, goes up and down 2 steps into the home      Prior Function   Level of Independence  Independent    Vocation  Retired    Leisure  no exercise      ROM / Strength   AROM / PROM / Strength  AROM;Strength      AROM   Overall AROM Comments  Lumbar ROM decreased 75% due to pain and fear, worries about vertigo with motions, knee AROM WFL's      Strength   Overall Strength Comments  right hip 4/5, left hip 4-/5, knees 4-/5, ankles 4-/5      Flexibility   Soft Tissue Assessment /Muscle Length  yes    Hamstrings  tight 30 degrees SLR    Quadriceps  very tight    ITB  very tight, tender    Piriformis  very tight      Palpation   Palpation comment  he is very tight and tender in the mms of the spine to the upper traps and the neck      Ambulation/Gait   Gait Comments  typically uses walls to creep along or uses spouse or friend to hold on to, he has a wide base of support, he tends to lean back      Standardized Balance Assessment   Standardized Balance Assessment  Timed Up and Go Test      Timed Up and Go  Test   Normal TUG (seconds)  48             Objective measurements completed on examination: See above findings.                PT Short Term Goals - 05/29/17 1642      PT SHORT TERM GOAL #1   Title  patient to be independent with initial HEP     Time  2    Period  Weeks    Status  New      PT SHORT TERM GOAL #2   Title  Patient to improve TUG score to 34 seconds demonstrating improved functional gait     Time  4    Period  Weeks    Status  New        PT Long Term Goals - 05/29/17 1643      PT LONG TERM GOAL #1   Title  Patient to improve TUG score to 27 seconds     Time  8    Period  Weeks    Status  New      PT LONG TERM GOAL #2   Title  Patient to report ability to walk 350 feet without rest or increase in low back pain     Period  Weeks    Status  New      PT LONG TERM GOAL #3   Title  increase LE strength to 4+/5    Time  8    Period  Weeks    Status  New      PT LONG TERM GOAL #4   Title  be independent with gym exercises    Time  8    Period  Weeks    Status  New             Plan - 05/29/17 1601    Clinical  Impression Statement  Patient has a long history of back pain and LE neuropathy.  He had a fall recently.  Has a history of vertigo and vision problems.  As well as liver CA.  He was referred to [place] due to losing function, weakness and pain.  We saw him last year and his TUG  time was about 22 seconds, today it was 48 seconds, showing a significant decline, he uses walls, furniture and people to hold onto to steady himself while walking, he tends to be back on his heels, has a wide base of support.    Clinical Presentation  Evolving    Clinical Decision Making  Moderate    Rehab Potential  Fair    PT Frequency  2x / week    PT Duration  8 weeks    PT Treatment/Interventions  ADLs/Self Care Home Management;Moist Heat;Functional mobility training;Therapeutic activities;Therapeutic exercise;Balance training;Neuromuscular  re-education;Manual techniques;Patient/family education    PT Next Visit Plan  Try to get him moving, work on function, try to help pain    Consulted and Agree with Plan of Care  Patient       Patient will benefit from skilled therapeutic intervention in order to improve the following deficits and impairments:  Abnormal gait, Difficulty walking, Cardiopulmonary status limiting activity, Decreased endurance, Dizziness, Increased muscle spasms, Decreased activity tolerance, Pain, Decreased balance, Impaired flexibility, Decreased strength, Decreased mobility  Visit Diagnosis: Muscle weakness (generalized) - Plan: PT plan of care cert/re-cert  Difficulty in walking, not elsewhere classified - Plan: PT plan of care cert/re-cert  Chronic low back pain, unspecified back pain laterality, with sciatica presence unspecified - Plan: PT plan of care cert/re-cert  Repeated falls - Plan: PT plan of care cert/re-cert     Problem List Patient Active Problem List   Diagnosis Date Noted  . Varicose veins of left lower extremity with complications 48/59/2763  . Knee pain, acute 01/26/2013  . Constipation 12/22/2012  . Neuropathy 12/22/2012  . Prostate hypertrophy 12/22/2012  . Acute blood loss anemia 12/22/2012  . Hyperlipidemia 12/22/2012  . Diabetes ([place]) 12/17/2012  . GERD (gastroesophageal reflux disease) 12/17/2012  . Hypertension 12/17/2012  . Thrombocytopenia, unspecified ([place]) 12/17/2012  . Postoperative anemia due to acute blood loss 12/15/2012  . Hyponatremia 12/15/2012  . OA (osteoarthritis) of knee 12/14/2012    [name], PT 05/29/2017, 5:11 PM  [place], [place], [address] Phone: [phone]   Fax:  [phone]  Name: MAXDEN NAJI MRN: [id number] Date of Birth: [birth year]

## 2017-05-30 DIAGNOSIS — Z950 Presence of cardiac pacemaker: Secondary | ICD-10-CM | POA: Diagnosis not present

## 2017-06-02 ENCOUNTER — Ambulatory Visit: Payer: Medicare Other | Admitting: Neurology

## 2017-06-12 ENCOUNTER — Ambulatory Visit: Payer: Medicare Other | Attending: Internal Medicine | Admitting: Physical Therapy

## 2017-06-12 DIAGNOSIS — G8929 Other chronic pain: Secondary | ICD-10-CM | POA: Insufficient documentation

## 2017-06-12 DIAGNOSIS — M25652 Stiffness of left hip, not elsewhere classified: Secondary | ICD-10-CM | POA: Diagnosis present

## 2017-06-12 DIAGNOSIS — M6281 Muscle weakness (generalized): Secondary | ICD-10-CM | POA: Diagnosis present

## 2017-06-12 DIAGNOSIS — R296 Repeated falls: Secondary | ICD-10-CM | POA: Diagnosis present

## 2017-06-12 DIAGNOSIS — R262 Difficulty in walking, not elsewhere classified: Secondary | ICD-10-CM | POA: Insufficient documentation

## 2017-06-12 DIAGNOSIS — M545 Low back pain: Secondary | ICD-10-CM | POA: Insufficient documentation

## 2017-06-12 DIAGNOSIS — M25651 Stiffness of right hip, not elsewhere classified: Secondary | ICD-10-CM | POA: Diagnosis present

## 2017-06-12 NOTE — Therapy (Signed)
[place], [place], [address] Phone: [phone]   Fax:  [phone]  Physical Therapy Treatment  Patient Details  Name: Keith [name] MRN: [id number] Date of Birth: Sep 04, 1940 Referring Provider: Osborne Casco   Encounter Date: 06/12/2017  PT End of Session - 06/12/17 1225    Visit Number  2    Date for PT Re-Evaluation  07/29/17    PT Start Time  5916    PT Stop Time  1226    PT Time Calculation (min)  42 min    Activity Tolerance  Patient tolerated treatment well;Patient limited by fatigue    Behavior During Therapy  [facility] for tasks assessed/performed       Past Medical History:  Diagnosis Date  . Cirrhosis of liver ([place])   . Diabetes mellitus   . Hepatic cancer ([place])   . Hypertension   . Prostate hypertrophy   . Stroke ([place])   . Varicose veins     Past Surgical History:  Procedure Laterality Date  . APPENDECTOMY    . EYE SURGERY     bilateral cataract   . left arm surgery     age 30  . left hand surgery     tendons ripped on left hand  . LIVER RESECTION    . TONSILLECTOMY    . TOTAL KNEE ARTHROPLASTY Left 12/14/2012   Procedure: LEFT TOTAL KNEE ARTHROPLASTY;  Surgeon: Gearlean Alf, MD;  [facility];  Service: Orthopedics;  Laterality: Left;    There were no vitals filed for this visit.  Subjective Assessment - 06/12/17 1148    Subjective  Pt reports having a a fall last week. "My legs just gave out and I went right to the floor, I don't know what happen, I don't know why, just one of those things"    Currently in Pain?  Yes    Pain Score  -- R foot 7/10, rest of body 5/10                      [facility] Adult PT Treatment/Exercise - 06/12/17 0001      Exercises   Exercises  Lumbar;Knee/Hip      Lumbar Exercises: Aerobic   UBE (Upper Arm Bike)  L2 93fd/2rev      Knee/Hip Exercises: Aerobic   Nustep  L5 x 6 min      Knee/Hip Exercises: Seated   Long  Arc Quad  2 sets;10 reps;Weights    Long Arc Quad Weight  3 lbs.    Ball Squeeze  2x10    Marching  Both;10 reps;2 sets 3lb    Hamstring Curl  Both;2 sets;15 reps    Hamstring Limitations  green tband       Manual Therapy   Manual Therapy  Soft tissue mobilization    Soft tissue mobilization  upper traps L and R                PT Short Term Goals - 05/29/17 1642      PT SHORT TERM GOAL #1   Title  patient to be independent with initial HEP     Time  2    Period  Weeks    Status  New      PT SHORT TERM GOAL #2   Title  Patient to improve TUG score to 34 seconds demonstrating improved functional gait     Time  4  Period  Weeks    Status  New        PT Long Term Goals - 05/29/17 1643      PT LONG TERM GOAL #1   Title  Patient to improve TUG score to 27 seconds     Time  8    Period  Weeks    Status  New      PT LONG TERM GOAL #2   Title  Patient to report ability to walk 350 feet without rest or increase in low back pain     Period  Weeks    Status  New      PT LONG TERM GOAL #3   Title  increase LE strength to 4+/5    Time  8    Period  Weeks    Status  New      PT LONG TERM GOAL #4   Title  be independent with gym exercises    Time  8    Period  Weeks    Status  New            Plan - 06/12/17 1226    Clinical Impression Statement  Pt tolerated an initial progression to exercises well. HHA x1 when ambulating around clinic. Reports having a fall last week and did not seek medical attention.     PT Frequency  2x / week    PT Duration  8 weeks    PT Treatment/Interventions  ADLs/Self Care Home Management;Moist Heat;Functional mobility training;Therapeutic activities;Therapeutic exercise;Balance training;Neuromuscular re-education;Manual techniques;Patient/family education    PT Next Visit Plan  Try to get him moving, work on function, try to help pain       Patient will benefit from skilled therapeutic intervention in order to improve the  following deficits and impairments:  Abnormal gait, Difficulty walking, Cardiopulmonary status limiting activity, Decreased endurance, Dizziness, Increased muscle spasms, Decreased activity tolerance, Pain, Decreased balance, Impaired flexibility, Decreased strength, Decreased mobility  Visit Diagnosis: Muscle weakness (generalized)  Difficulty in walking, not elsewhere classified  Chronic low back pain, unspecified back pain laterality, with sciatica presence unspecified  Repeated falls  Stiffness of left hip, not elsewhere classified     Problem List Patient Active Problem List   Diagnosis Date Noted  . Varicose veins of left lower extremity with complications 88/50/2774  . Knee pain, acute 01/26/2013  . Constipation 12/22/2012  . Neuropathy 12/22/2012  . Prostate hypertrophy 12/22/2012  . Acute blood loss anemia 12/22/2012  . Hyperlipidemia 12/22/2012  . Diabetes ([place]) 12/17/2012  . GERD (gastroesophageal reflux disease) 12/17/2012  . Hypertension 12/17/2012  . Thrombocytopenia, unspecified ([place]) 12/17/2012  . Postoperative anemia due to acute blood loss 12/15/2012  . Hyponatremia 12/15/2012  . OA (osteoarthritis) of knee 12/14/2012    Scot Jun, PTA 06/12/2017, 12:29 PM  [place], [place], [address] Phone: [phone]   Fax:  [phone]  Name: OWEN PRATTE MRN: [id number] Date of Birth: 09-15-1940

## 2017-06-19 ENCOUNTER — Encounter: Payer: Self-pay | Admitting: Physical Therapy

## 2017-06-19 ENCOUNTER — Ambulatory Visit: Payer: Medicare Other | Admitting: Physical Therapy

## 2017-06-19 DIAGNOSIS — M6281 Muscle weakness (generalized): Secondary | ICD-10-CM | POA: Diagnosis not present

## 2017-06-19 DIAGNOSIS — R262 Difficulty in walking, not elsewhere classified: Secondary | ICD-10-CM

## 2017-06-19 DIAGNOSIS — M545 Low back pain: Secondary | ICD-10-CM

## 2017-06-19 DIAGNOSIS — G8929 Other chronic pain: Secondary | ICD-10-CM

## 2017-06-19 DIAGNOSIS — R296 Repeated falls: Secondary | ICD-10-CM

## 2017-06-19 NOTE — Therapy (Signed)
[place], [place], [address] Phone: [phone]   Fax:  [phone]  Physical Therapy Treatment  Patient Details  Name: Keith [name] MRN: [id number] Date of Birth: Sep 03, 1940 Referring Provider: Osborne Casco   Encounter Date: 06/19/2017  PT End of Session - 06/19/17 1403    Visit Number  3    Date for PT Re-Evaluation  07/29/17    PT Start Time  1055    PT Stop Time  1140    PT Time Calculation (min)  45 min    Activity Tolerance  Patient tolerated treatment well;Patient limited by fatigue    Behavior During Therapy  [facility] for tasks assessed/performed       Past Medical History:  Diagnosis Date  . Cirrhosis of liver ([place])   . Diabetes mellitus   . Hepatic cancer ([place])   . Hypertension   . Prostate hypertrophy   . Stroke ([place])   . Varicose veins     Past Surgical History:  Procedure Laterality Date  . APPENDECTOMY    . EYE SURGERY     bilateral cataract   . left arm surgery     age 10  . left hand surgery     tendons ripped on left hand  . LIVER RESECTION    . TONSILLECTOMY    . TOTAL KNEE ARTHROPLASTY Left 12/14/2012   Procedure: LEFT TOTAL KNEE ARTHROPLASTY;  Surgeon: Gearlean Alf, MD;  [facility];  Service: Orthopedics;  Laterality: Left;    There were no vitals filed for this visit.  Subjective Assessment - 06/19/17 1359    Subjective  Patient reports no more falls, reports that his legs hurt, he is needing assist to walk inot the building    Currently in Pain?  Yes    Pain Score  2     Pain Location  Back                      [facility] Adult PT Treatment/Exercise - 06/19/17 0001      Lumbar Exercises: Stretches   Passive Hamstring Stretch  3 reps;20 seconds in sitting      Lumbar Exercises: Aerobic   UBE (Upper Arm Bike)  L2 58fd/2rev    Nustep  Level 5 x 7 minutes      Lumbar Exercises: Machines for Strengthening   Cybex Lumbar Extension   black tband 3x10    Cybex Knee Extension  5# 2x10    Cybex Knee Flexion  25# 3x10    Other Lumbar Machine Exercise  red tband ankle DF/PF    Other Lumbar Machine Exercise  green tband scapular stabilization 3 ways               PT Short Term Goals - 06/19/17 1405      PT SHORT TERM GOAL #1   Title  patient to be independent with initial HEP     Status  On-going      PT SHORT TERM GOAL #2   Title  Patient to improve TUG score to 34 seconds demonstrating improved functional gait     Status  On-going        PT Long Term Goals - 05/29/17 1643      PT LONG TERM GOAL #1   Title  Patient to improve TUG score to 27 seconds     Time  8    Period  Weeks  Status  New      PT LONG TERM GOAL #2   Title  Patient to report ability to walk 350 feet without rest or increase in low back pain     Period  Weeks    Status  New      PT LONG TERM GOAL #3   Title  increase LE strength to 4+/5    Time  8    Period  Weeks    Status  New      PT LONG TERM GOAL #4   Title  be independent with gym exercises    Time  8    Period  Weeks    Status  New            Plan - 06/19/17 1404    Clinical Impression Statement  Patient with some reports of pain iwth the exercises especially difficult for him to get on and off the leg ext/flex machine.  He is unsteady with his walking, tends to use walls and furniture or needs HHA    PT Next Visit Plan  Try to get him moving, work on function, try to help pain    Consulted and Agree with Plan of Care  Patient       Patient will benefit from skilled therapeutic intervention in order to improve the following deficits and impairments:  Abnormal gait, Difficulty walking, Cardiopulmonary status limiting activity, Decreased endurance, Dizziness, Increased muscle spasms, Decreased activity tolerance, Pain, Decreased balance, Impaired flexibility, Decreased strength, Decreased mobility  Visit Diagnosis: Muscle weakness  (generalized)  Difficulty in walking, not elsewhere classified  Chronic low back pain, unspecified back pain laterality, with sciatica presence unspecified  Repeated falls     Problem List Patient Active Problem List   Diagnosis Date Noted  . Varicose veins of left lower extremity with complications 19/37/9024  . Knee pain, acute 01/26/2013  . Constipation 12/22/2012  . Neuropathy 12/22/2012  . Prostate hypertrophy 12/22/2012  . Acute blood loss anemia 12/22/2012  . Hyperlipidemia 12/22/2012  . Diabetes ([place]) 12/17/2012  . GERD (gastroesophageal reflux disease) 12/17/2012  . Hypertension 12/17/2012  . Thrombocytopenia, unspecified ([place]) 12/17/2012  . Postoperative anemia due to acute blood loss 12/15/2012  . Hyponatremia 12/15/2012  . OA (osteoarthritis) of knee 12/14/2012    [name], PT 06/19/2017, 2:05 PM  [place], [place], [address] Phone: [phone]   Fax:  [phone]  Name: Keith [name] MRN: [id number] Date of Birth: 1941/03/25

## 2017-06-23 ENCOUNTER — Ambulatory Visit: Payer: Medicare Other | Admitting: Physical Therapy

## 2017-06-25 ENCOUNTER — Ambulatory Visit: Payer: Medicare Other | Admitting: Physical Therapy

## 2017-06-30 ENCOUNTER — Encounter: Payer: Self-pay | Admitting: Physical Therapy

## 2017-06-30 ENCOUNTER — Ambulatory Visit: Payer: Medicare Other | Admitting: Physical Therapy

## 2017-06-30 DIAGNOSIS — M25652 Stiffness of left hip, not elsewhere classified: Secondary | ICD-10-CM

## 2017-06-30 DIAGNOSIS — M25651 Stiffness of right hip, not elsewhere classified: Secondary | ICD-10-CM

## 2017-06-30 DIAGNOSIS — M545 Low back pain: Secondary | ICD-10-CM

## 2017-06-30 DIAGNOSIS — M6281 Muscle weakness (generalized): Secondary | ICD-10-CM

## 2017-06-30 DIAGNOSIS — R296 Repeated falls: Secondary | ICD-10-CM

## 2017-06-30 DIAGNOSIS — R262 Difficulty in walking, not elsewhere classified: Secondary | ICD-10-CM

## 2017-06-30 DIAGNOSIS — G8929 Other chronic pain: Secondary | ICD-10-CM

## 2017-06-30 NOTE — Therapy (Signed)
[place], [place], [address] Phone: [phone]   Fax:  [phone]  Physical Therapy Treatment  Patient Details  Name: Keith [name] MRN: [id number] Date of Birth: 02-11-1941 Referring Provider: Osborne Casco   Encounter Date: 06/30/2017  PT End of Session - 06/30/17 1209    Visit Number  4    Date for PT Re-Evaluation  07/29/17    PT Start Time  1100    PT Stop Time  1150    PT Time Calculation (min)  50 min    Activity Tolerance  Patient tolerated treatment well;Patient limited by fatigue;Patient limited by pain    Behavior During Therapy  [facility] for tasks assessed/performed       Past Medical History:  Diagnosis Date  . Cirrhosis of liver ([place])   . Diabetes mellitus   . Hepatic cancer ([place])   . Hypertension   . Prostate hypertrophy   . Stroke ([place])   . Varicose veins     Past Surgical History:  Procedure Laterality Date  . APPENDECTOMY    . EYE SURGERY     bilateral cataract   . left arm surgery     age 71  . left hand surgery     tendons ripped on left hand  . LIVER RESECTION    . TONSILLECTOMY    . TOTAL KNEE ARTHROPLASTY Left 12/14/2012   Procedure: LEFT TOTAL KNEE ARTHROPLASTY;  Surgeon: Gearlean Alf, MD;  Location: [facility];  Service: Orthopedics;  Laterality: Left;    There were no vitals filed for this visit.  Subjective Assessment - 06/30/17 1206    Subjective  Patient was unable to come in last week, he came to the clinic but was unable to get out of car due to pain in the legs and difficulty walking.  Reports no falls, but reports pain in the legs, he will be seeing MD about his back next week    Currently in Pain?  Yes    Pain Score  7     Pain Location  Leg    Pain Orientation  Left;Upper;Lower    Aggravating Factors   unsure    Pain Relieving Factors  rest and massage                No data recorded       [facility] Adult PT Treatment/Exercise -  06/30/17 0001      Ambulation/Gait   Gait Comments  gait HHA x 180 feet some help to stabilizew due to LOB      Lumbar Exercises: Stretches   Passive Hamstring Stretch  3 reps;20 seconds      Lumbar Exercises: Aerobic   Nustep  Level 5 x 7 minutes      Lumbar Exercises: Machines for Strengthening   Cybex Lumbar Extension  black tband 3x10    Other Lumbar Machine Exercise  red tband ankle DF/PF    Other Lumbar Machine Exercise  green tband scapular stabilization 3 ways      Knee/Hip Exercises: Seated   Long Arc Quad  2 sets;10 reps;Weights    Long Arc Quad Weight  3 lbs.    Hamstring Curl  Both;2 sets;15 reps    Hamstring Limitations  green tband       Manual Therapy   Manual Therapy  Soft tissue mobilization    Soft tissue mobilization  lefth thigh and calf area, use of percussion  PT Short Term Goals - 06/30/17 1212      PT SHORT TERM GOAL #1   Title  patient to be independent with initial HEP     Status  Partially Met        PT Long Term Goals - 05/29/17 1643      PT LONG TERM GOAL #1   Title  Patient to improve TUG score to 27 seconds     Time  8    Period  Weeks    Status  New      PT LONG TERM GOAL #2   Title  Patient to report ability to walk 350 feet without rest or increase in low back pain     Period  Weeks    Status  New      PT LONG TERM GOAL #3   Title  increase LE strength to 4+/5    Time  8    Period  Weeks    Status  New      PT LONG TERM GOAL #4   Title  be independent with gym exercises    Time  8    Period  Weeks    Status  New            Plan - 06/30/17 1209    Clinical Impression Statement  Patient having more leg pain and increased difficulty walking.  He is very tight and tender in the thigh and calf.    PT Next Visit Plan  Try to get him moving, work on function, try to help pain    Consulted and Agree with Plan of Care  Patient       Patient will benefit from skilled therapeutic intervention in  order to improve the following deficits and impairments:  Abnormal gait, Difficulty walking, Cardiopulmonary status limiting activity, Decreased endurance, Dizziness, Increased muscle spasms, Decreased activity tolerance, Pain, Decreased balance, Impaired flexibility, Decreased strength, Decreased mobility  Visit Diagnosis: Muscle weakness (generalized)  Difficulty in walking, not elsewhere classified  Chronic low back pain, unspecified back pain laterality, with sciatica presence unspecified  Repeated falls  Stiffness of left hip, not elsewhere classified  Stiffness of right hip, not elsewhere classified     Problem List Patient Active Problem List   Diagnosis Date Noted  . Varicose veins of left lower extremity with complications 53/66/4403  . Knee pain, acute 01/26/2013  . Constipation 12/22/2012  . Neuropathy 12/22/2012  . Prostate hypertrophy 12/22/2012  . Acute blood loss anemia 12/22/2012  . Hyperlipidemia 12/22/2012  . Diabetes ([place]) 12/17/2012  . GERD (gastroesophageal reflux disease) 12/17/2012  . Hypertension 12/17/2012  . Thrombocytopenia, unspecified ([place]) 12/17/2012  . Postoperative anemia due to acute blood loss 12/15/2012  . Hyponatremia 12/15/2012  . OA (osteoarthritis) of knee 12/14/2012    [name], PT 06/30/2017, 12:13 PM  [place] [address] [place], [place], [address] Phone: [phone]   Fax:  [phone]  Name: Keith [name] MRN: [id number] Date of Birth: 1940-07-25

## 2017-07-02 ENCOUNTER — Ambulatory Visit: Payer: Medicare Other | Admitting: Physical Therapy

## 2017-07-02 ENCOUNTER — Encounter: Payer: Self-pay | Admitting: Physical Therapy

## 2017-07-02 DIAGNOSIS — M6281 Muscle weakness (generalized): Secondary | ICD-10-CM

## 2017-07-02 DIAGNOSIS — M545 Low back pain: Secondary | ICD-10-CM

## 2017-07-02 DIAGNOSIS — R296 Repeated falls: Secondary | ICD-10-CM

## 2017-07-02 DIAGNOSIS — R262 Difficulty in walking, not elsewhere classified: Secondary | ICD-10-CM

## 2017-07-02 DIAGNOSIS — G8929 Other chronic pain: Secondary | ICD-10-CM

## 2017-07-02 NOTE — Therapy (Signed)
[place], [place], [address] Phone: [phone]   Fax:  [phone]  Physical Therapy Treatment  Patient Details  Name: Keith [name] MRN: [id number] Date of Birth: 04-Mar-1941 Referring Provider: Osborne Casco   Encounter Date: 07/02/2017  PT End of Session - 07/02/17 1144    Visit Number  5    Date for PT Re-Evaluation  07/29/17    PT Start Time  1055    PT Stop Time  1143    PT Time Calculation (min)  48 min    Activity Tolerance  Patient tolerated treatment well;Patient limited by fatigue;Patient limited by pain    Behavior During Therapy  [facility] for tasks assessed/performed       Past Medical History:  Diagnosis Date  . Cirrhosis of liver ([place])   . Diabetes mellitus   . Hepatic cancer ([place])   . Hypertension   . Prostate hypertrophy   . Stroke ([place])   . Varicose veins     Past Surgical History:  Procedure Laterality Date  . APPENDECTOMY    . EYE SURGERY     bilateral cataract   . left arm surgery     age 42  . left hand surgery     tendons ripped on left hand  . LIVER RESECTION    . TONSILLECTOMY    . TOTAL KNEE ARTHROPLASTY Left 12/14/2012   Procedure: LEFT TOTAL KNEE ARTHROPLASTY;  Surgeon: Gearlean Alf, MD;  Location[facility];  Service: Orthopedics;  Laterality: Left;    There were no vitals filed for this visit.  Subjective Assessment - 07/02/17 1104    Subjective  Patient has reports that he is hurting in the legs and the back.      Currently in Pain?  Yes    Pain Score  8     Pain Location  Leg    Pain Orientation  Right;Left;Lower                No data recorded       [facility] Adult PT Treatment/Exercise - 07/02/17 0001      Ambulation/Gait   Gait Comments  gait HHA x 180 feet some help to stabilizew due to LOB      Lumbar Exercises: Stretches   Passive Hamstring Stretch  3 reps;20 seconds      Lumbar Exercises: Aerobic   Nustep  Level 5 x 8  minutes      Lumbar Exercises: Machines for Strengthening   Cybex Lumbar Extension  black tband 3x10    Other Lumbar Machine Exercise  red tband ankle DF/PF    Other Lumbar Machine Exercise  green tband scapular stabilization 3 ways      Lumbar Exercises: Seated   Long Arc Quad on Chair  20 reps;Both    Other Seated Lumbar Exercises  hamstring curls green tband    Other Seated Lumbar Exercises  ball in lap isometric abs      Manual Therapy   Manual Therapy  Soft tissue mobilization    Soft tissue mobilization  lefth thigh and calf area, use of percussion, also into the low back               PT Short Term Goals - 06/30/17 1212      PT SHORT TERM GOAL #1   Title  patient to be independent with initial HEP     Status  Partially Met  PT Long Term Goals - 05/29/17 1643      PT LONG TERM GOAL #1   Title  Patient to improve TUG score to 27 seconds     Time  8    Period  Weeks    Status  New      PT LONG TERM GOAL #2   Title  Patient to report ability to walk 350 feet without rest or increase in low back pain     Period  Weeks    Status  New      PT LONG TERM GOAL #3   Title  increase LE strength to 4+/5    Time  8    Period  Weeks    Status  New      PT LONG TERM GOAL #4   Title  be independent with gym exercises    Time  8    Period  Weeks    Status  New            Plan - 07/02/17 1144    Clinical Impression Statement  Patient with less CGA and more supervision for his walking, still tended to touch wall for balance, he is very tight in the quad and ITB.    PT Next Visit Plan  will continue to work on his tolerance to activity as well as wathcing for pain    Consulted and Agree with Plan of Care  Patient       Patient will benefit from skilled therapeutic intervention in order to improve the following deficits and impairments:  Abnormal gait, Difficulty walking, Cardiopulmonary status limiting activity, Decreased endurance, Dizziness,  Increased muscle spasms, Decreased activity tolerance, Pain, Decreased balance, Impaired flexibility, Decreased strength, Decreased mobility  Visit Diagnosis: Muscle weakness (generalized)  Difficulty in walking, not elsewhere classified  Chronic low back pain, unspecified back pain laterality, with sciatica presence unspecified  Repeated falls     Problem List Patient Active Problem List   Diagnosis Date Noted  . Varicose veins of left lower extremity with complications 92/42/6834  . Knee pain, acute 01/26/2013  . Constipation 12/22/2012  . Neuropathy 12/22/2012  . Prostate hypertrophy 12/22/2012  . Acute blood loss anemia 12/22/2012  . Hyperlipidemia 12/22/2012  . Diabetes ([place]) 12/17/2012  . GERD (gastroesophageal reflux disease) 12/17/2012  . Hypertension 12/17/2012  . Thrombocytopenia, unspecified ([place]) 12/17/2012  . Postoperative anemia due to acute blood loss 12/15/2012  . Hyponatremia 12/15/2012  . OA (osteoarthritis) of knee 12/14/2012    [name], PT 07/02/2017, 11:46 AM  [place] [address] [place], [place], [address] Phone: [phone]   Fax:  [phone]  Name: Keith [name] MRN: [id number] Date of Birth: 1940/10/24

## 2017-07-07 ENCOUNTER — Encounter: Payer: Self-pay | Admitting: Physical Therapy

## 2017-07-07 ENCOUNTER — Ambulatory Visit: Payer: Medicare Other | Attending: Internal Medicine | Admitting: Physical Therapy

## 2017-07-07 DIAGNOSIS — M545 Low back pain: Secondary | ICD-10-CM | POA: Insufficient documentation

## 2017-07-07 DIAGNOSIS — M25652 Stiffness of left hip, not elsewhere classified: Secondary | ICD-10-CM | POA: Insufficient documentation

## 2017-07-07 DIAGNOSIS — M25651 Stiffness of right hip, not elsewhere classified: Secondary | ICD-10-CM | POA: Diagnosis not present

## 2017-07-07 DIAGNOSIS — R296 Repeated falls: Secondary | ICD-10-CM | POA: Diagnosis not present

## 2017-07-07 DIAGNOSIS — M6281 Muscle weakness (generalized): Secondary | ICD-10-CM | POA: Insufficient documentation

## 2017-07-07 DIAGNOSIS — R262 Difficulty in walking, not elsewhere classified: Secondary | ICD-10-CM | POA: Insufficient documentation

## 2017-07-07 DIAGNOSIS — G8929 Other chronic pain: Secondary | ICD-10-CM

## 2017-07-07 NOTE — Therapy (Signed)
[place], [place], [address] Phone: [phone]   Fax:  [phone]  Physical Therapy Treatment  Patient Details  Name: Keith [name] MRN: [id number] Date of Birth: 26-Aug-1940 Referring Provider: Osborne Casco   Encounter Date: 07/07/2017  PT End of Session - 07/07/17 1308    Visit Number  6    Date for PT Re-Evaluation  07/29/17    PT Start Time  1100    PT Stop Time  1135    PT Time Calculation (min)  35 min    Activity Tolerance  Patient tolerated treatment well;Patient limited by fatigue;Patient limited by pain    Behavior During Therapy  [facility] for tasks assessed/performed       Past Medical History:  Diagnosis Date  . Cirrhosis of liver ([place])   . Diabetes mellitus   . Hepatic cancer ([place])   . Hypertension   . Prostate hypertrophy   . Stroke ([place])   . Varicose veins     Past Surgical History:  Procedure Laterality Date  . APPENDECTOMY    . EYE SURGERY     bilateral cataract   . left arm surgery     age 77  . left hand surgery     tendons ripped on left hand  . LIVER RESECTION    . TONSILLECTOMY    . TOTAL KNEE ARTHROPLASTY Left 12/14/2012   Procedure: LEFT TOTAL KNEE ARTHROPLASTY;  Surgeon: Gearlean Alf, MD;  Location[facility];  Service: Orthopedics;  Laterality: Left;    There were no vitals filed for this visit.  Subjective Assessment - 07/07/17 1307    Subjective  Patient reports increased back pain    Currently in Pain?  Yes    Pain Score  8     Pain Location  Back legs    Aggravating Factors   reports very weak in legs today, more difficulty walking                       [facility] Adult PT Treatment/Exercise - 07/07/17 0001      Lumbar Exercises: Aerobic   Nustep  Level 5 x 4 minutes      Lumbar Exercises: Machines for Strengthening   Other Lumbar Machine Exercise  red tband ankle DF/PF    Other Lumbar Machine Exercise  green tband scapular  stabilization 3 ways      Manual Therapy   Manual Therapy  Soft tissue mobilization    Manual therapy comments  STM of the quads and ITB and into the calves               PT Short Term Goals - 06/30/17 1212      PT SHORT TERM GOAL #1   Title  patient to be independent with initial HEP     Status  Partially Met        PT Long Term Goals - 05/29/17 1643      PT LONG TERM GOAL #1   Title  Patient to improve TUG score to 27 seconds     Time  8    Period  Weeks    Status  New      PT LONG TERM GOAL #2   Title  Patient to report ability to walk 350 feet without rest or increase in low back pain     Period  Weeks    Status  New  PT LONG TERM GOAL #3   Title  increase LE strength to 4+/5    Time  8    Period  Weeks    Status  New      PT LONG TERM GOAL #4   Title  be independent with gym exercises    Time  8    Period  Weeks    Status  New            Plan - 07/07/17 1309    Clinical Impression Statement  Patient with much more c/o pain today, reports that he is having more trouble walking.  He is very tight in the ITB, calves and the upper traps and the lumbar area       Patient will benefit from skilled therapeutic intervention in order to improve the following deficits and impairments:  Abnormal gait, Difficulty walking, Cardiopulmonary status limiting activity, Decreased endurance, Dizziness, Increased muscle spasms, Decreased activity tolerance, Pain, Decreased balance, Impaired flexibility, Decreased strength, Decreased mobility  Visit Diagnosis: Muscle weakness (generalized)  Difficulty in walking, not elsewhere classified  Chronic low back pain, unspecified back pain laterality, with sciatica presence unspecified  Repeated falls     Problem List Patient Active Problem List   Diagnosis Date Noted  . Varicose veins of left lower extremity with complications 12/26/413  . Knee pain, acute 01/26/2013  . Constipation 12/22/2012  .  Neuropathy 12/22/2012  . Prostate hypertrophy 12/22/2012  . Acute blood loss anemia 12/22/2012  . Hyperlipidemia 12/22/2012  . Diabetes ([place]) 12/17/2012  . GERD (gastroesophageal reflux disease) 12/17/2012  . Hypertension 12/17/2012  . Thrombocytopenia, unspecified ([place]) 12/17/2012  . Postoperative anemia due to acute blood loss 12/15/2012  . Hyponatremia 12/15/2012  . OA (osteoarthritis) of knee 12/14/2012    [name], PT 07/07/2017, 1:10 PM  [place], [place], [address] Phone: [phone]   Fax:  [phone]  Name: SANTHIAGO COLLINGSWORTH MRN: [id number] Date of Birth: [birth year]

## 2017-07-09 ENCOUNTER — Ambulatory Visit: Payer: Medicare Other | Admitting: Physical Therapy

## 2017-07-09 ENCOUNTER — Encounter: Payer: Self-pay | Admitting: Physical Therapy

## 2017-07-09 DIAGNOSIS — R296 Repeated falls: Secondary | ICD-10-CM | POA: Diagnosis not present

## 2017-07-09 DIAGNOSIS — M545 Low back pain: Secondary | ICD-10-CM | POA: Diagnosis not present

## 2017-07-09 DIAGNOSIS — R262 Difficulty in walking, not elsewhere classified: Secondary | ICD-10-CM

## 2017-07-09 DIAGNOSIS — M6281 Muscle weakness (generalized): Secondary | ICD-10-CM

## 2017-07-09 DIAGNOSIS — M25651 Stiffness of right hip, not elsewhere classified: Secondary | ICD-10-CM | POA: Diagnosis not present

## 2017-07-09 DIAGNOSIS — G8929 Other chronic pain: Secondary | ICD-10-CM | POA: Diagnosis not present

## 2017-07-09 DIAGNOSIS — M25652 Stiffness of left hip, not elsewhere classified: Secondary | ICD-10-CM | POA: Diagnosis not present

## 2017-07-09 NOTE — Therapy (Signed)
[place], [place], [address] Phone: [phone]   Fax:  [phone]  Physical Therapy Treatment  Patient Details  Name: Keith [name] MRN: [id number] Date of Birth: Oct 04, 1940 Referring Provider: Osborne Casco   Encounter Date: 07/09/2017  PT End of Session - 07/09/17 1149    Visit Number  7    Date for PT Re-Evaluation  07/29/17    PT Start Time  1055    PT Stop Time  1130    PT Time Calculation (min)  35 min    Activity Tolerance  Patient tolerated treatment well;Patient limited by fatigue;Patient limited by pain    Behavior During Therapy  [facility] for tasks assessed/performed       Past Medical History:  Diagnosis Date  . Cirrhosis of liver ([place])   . Diabetes mellitus   . Hepatic cancer ([place])   . Hypertension   . Prostate hypertrophy   . Stroke ([place])   . Varicose veins     Past Surgical History:  Procedure Laterality Date  . APPENDECTOMY    . EYE SURGERY     bilateral cataract   . left arm surgery     age 12  . left hand surgery     tendons ripped on left hand  . LIVER RESECTION    . TONSILLECTOMY    . TOTAL KNEE ARTHROPLASTY Left 12/14/2012   Procedure: LEFT TOTAL KNEE ARTHROPLASTY;  Surgeon: Gearlean Alf, MD;  Location[facility];  Service: Orthopedics;  Laterality: Left;    There were no vitals filed for this visit.  Subjective Assessment - 07/09/17 1146    Subjective  Patient again reporting decreased ability to walk, using friend to help him today    Currently in Pain?  Yes    Pain Score  7     Pain Location  Back legs    Pain Relieving Factors  massage and rest                       [facility] Adult PT Treatment/Exercise - 07/09/17 0001      Ambulation/Gait   Gait Comments  gait HHA x 180 feet some help to stabilize due to LOB      Lumbar Exercises: Stretches   Passive Hamstring Stretch  3 reps;20 seconds    Gastroc Stretch  3 reps;20 seconds      Lumbar Exercises: Aerobic   Nustep  Level 4 x 5 minutes      Lumbar Exercises: Machines for Strengthening   Cybex Lumbar Extension  black tband 3x10    Other Lumbar Machine Exercise  red tband ankle DF/PF    Other Lumbar Machine Exercise  green tband scapular stabilization 3 ways      Manual Therapy   Manual Therapy  Soft tissue mobilization    Manual therapy comments  STM of the quads and ITB and into the calves    Soft tissue mobilization  some STM in the low and mid back               PT Short Term Goals - 07/09/17 1150      PT SHORT TERM GOAL #1   Title  patient to be independent with initial HEP     Status  Achieved        PT Long Term Goals - 05/29/17 1643      PT LONG TERM GOAL #1   Title  Patient to improve TUG score to 27 seconds     Time  8    Period  Weeks    Status  New      PT LONG TERM GOAL #2   Title  Patient to report ability to walk 350 feet without rest or increase in low back pain     Period  Weeks    Status  New      PT LONG TERM GOAL #3   Title  increase LE strength to 4+/5    Time  8    Period  Weeks    Status  New      PT LONG TERM GOAL #4   Title  be independent with gym exercises    Time  8    Period  Weeks    Status  New            Plan - 07/09/17 1149    Clinical Impression Statement  Patient continues to report increased pain, slightly less today than on Monday, he is having more difficulty walking and reports that he exercises do cause some increase of pain, his legs and back mms are very tight    PT Next Visit Plan  will continue to work on his tolerance to activity as well as wathcing for pain    Consulted and Agree with Plan of Care  Patient       Patient will benefit from skilled therapeutic intervention in order to improve the following deficits and impairments:  Abnormal gait, Difficulty walking, Cardiopulmonary status limiting activity, Decreased endurance, Dizziness, Increased muscle spasms, Decreased activity  tolerance, Pain, Decreased balance, Impaired flexibility, Decreased strength, Decreased mobility  Visit Diagnosis: Muscle weakness (generalized)  Difficulty in walking, not elsewhere classified  Chronic low back pain, unspecified back pain laterality, with sciatica presence unspecified  Repeated falls     Problem List Patient Active Problem List   Diagnosis Date Noted  . Varicose veins of left lower extremity with complications 62/94/7654  . Knee pain, acute 01/26/2013  . Constipation 12/22/2012  . Neuropathy 12/22/2012  . Prostate hypertrophy 12/22/2012  . Acute blood loss anemia 12/22/2012  . Hyperlipidemia 12/22/2012  . Diabetes ([place]) 12/17/2012  . GERD (gastroesophageal reflux disease) 12/17/2012  . Hypertension 12/17/2012  . Thrombocytopenia, unspecified ([place]) 12/17/2012  . Postoperative anemia due to acute blood loss 12/15/2012  . Hyponatremia 12/15/2012  . OA (osteoarthritis) of knee 12/14/2012    [name], PT 07/09/2017, 11:51 AM  [place] [address] [place], [place], [address] Phone: [phone]   Fax:  [phone]  Name: Keith [name] MRN: [id number] Date of Birth: 11-11-1940

## 2017-07-23 DIAGNOSIS — E103493 Type 1 diabetes mellitus with severe nonproliferative diabetic retinopathy without macular edema, bilateral: Secondary | ICD-10-CM | POA: Diagnosis not present

## 2017-07-30 ENCOUNTER — Encounter: Payer: Self-pay | Admitting: Physical Therapy

## 2017-07-30 ENCOUNTER — Ambulatory Visit: Payer: Medicare Other | Admitting: Physical Therapy

## 2017-07-30 DIAGNOSIS — G8929 Other chronic pain: Secondary | ICD-10-CM

## 2017-07-30 DIAGNOSIS — R296 Repeated falls: Secondary | ICD-10-CM

## 2017-07-30 DIAGNOSIS — M545 Low back pain: Secondary | ICD-10-CM | POA: Diagnosis not present

## 2017-07-30 DIAGNOSIS — M25652 Stiffness of left hip, not elsewhere classified: Secondary | ICD-10-CM | POA: Diagnosis not present

## 2017-07-30 DIAGNOSIS — M25651 Stiffness of right hip, not elsewhere classified: Secondary | ICD-10-CM

## 2017-07-30 DIAGNOSIS — M6281 Muscle weakness (generalized): Secondary | ICD-10-CM

## 2017-07-30 DIAGNOSIS — R262 Difficulty in walking, not elsewhere classified: Secondary | ICD-10-CM | POA: Diagnosis not present

## 2017-07-30 NOTE — Therapy (Signed)
[place], [place], [address] Phone: [phone]   Fax:  [phone]  Physical Therapy Treatment  Patient Details  Name: Keith [name] MRN: [id number] Date of Birth: 10-Nov-1940 Referring Provider: Osborne Casco   Encounter Date: 07/30/2017  PT End of Session - 07/30/17 1642    Visit Number  8    Date for PT Re-Evaluation  08/29/17    PT Start Time  3716    PT Stop Time  1605    PT Time Calculation (min)  35 min    Activity Tolerance  Patient tolerated treatment well;Patient limited by fatigue;Patient limited by pain    Behavior During Therapy  [facility] for tasks assessed/performed       Past Medical History:  Diagnosis Date  . Cirrhosis of liver ([place])   . Diabetes mellitus   . Hepatic cancer ([place])   . Hypertension   . Prostate hypertrophy   . Stroke ([place])   . Varicose veins     Past Surgical History:  Procedure Laterality Date  . APPENDECTOMY    . EYE SURGERY     bilateral cataract   . left arm surgery     age 34  . left hand surgery     tendons ripped on left hand  . LIVER RESECTION    . TONSILLECTOMY    . TOTAL KNEE ARTHROPLASTY Left 12/14/2012   Procedure: LEFT TOTAL KNEE ARTHROPLASTY;  Surgeon: Gearlean Alf, MD;  [facility];  Service: Orthopedics;  Laterality: Left;    There were no vitals filed for this visit.  Subjective Assessment - 07/30/17 1632    Subjective  Patient reports that his legs have really been hurting and he has been dizzy and he has not been able to do much    Currently in Pain?  Yes    Pain Score  7     Pain Location  Leg    Pain Orientation  Right;Left;Upper    Aggravating Factors   doppler study was negative for clots in legs, will look at back next                       [facility] Adult PT Treatment/Exercise - 07/30/17 0001      Ambulation/Gait   Gait Comments  gait HHA x 180 feet some help to stabilize due to LOB, had one instance  of going through a doorway that he got really dizzy and needed help to keep balance      Lumbar Exercises: Stretches   Passive Hamstring Stretch  3 reps;20 seconds    Gastroc Stretch  3 reps;20 seconds      Lumbar Exercises: Aerobic   Nustep  Level 3 x 4 minutes      Lumbar Exercises: Machines for Strengthening   Other Lumbar Machine Exercise  green tband scapular stabilization 3 ways      Lumbar Exercises: Seated   Other Seated Lumbar Exercises  ball in lap isometric abs      Manual Therapy   Manual Therapy  Soft tissue mobilization    Manual therapy comments  STM of the quads and ITB and into the calves    Soft tissue mobilization  some STM in the low and mid back               PT Short Term Goals - 07/09/17 1150      PT SHORT TERM GOAL #1  Title  patient to be independent with initial HEP     Status  Achieved        PT Long Term Goals - 07/30/17 1646      PT LONG TERM GOAL #1   Title  Patient to improve TUG score to 27 seconds     Status  On-going      PT LONG TERM GOAL #2   Title  Patient to report ability to walk 350 feet without rest or increase in low back pain     Status  On-going            Plan - 07/30/17 1643    Clinical Impression Statement  Patient has not been able to attend PT recently as he was being tested for blood clots in his legs, he will have further testing on back to see if the back is causing the leg pain, he was more off balance, needing some assist to right himself.  He and his wife came and made appointments today so he would be more consistent in attending PT    PT Next Visit Plan  will continue to work on his tolerance to activity as well as wathcing for pain    Consulted and Agree with Plan of Care  Patient       Patient will benefit from skilled therapeutic intervention in order to improve the following deficits and impairments:  Abnormal gait, Difficulty walking, Cardiopulmonary status limiting activity, Decreased  endurance, Dizziness, Increased muscle spasms, Decreased activity tolerance, Pain, Decreased balance, Impaired flexibility, Decreased strength, Decreased mobility  Visit Diagnosis: Muscle weakness (generalized) - Plan: PT plan of care cert/re-cert  Difficulty in walking, not elsewhere classified - Plan: PT plan of care cert/re-cert  Chronic low back pain, unspecified back pain laterality, with sciatica presence unspecified - Plan: PT plan of care cert/re-cert  Repeated falls - Plan: PT plan of care cert/re-cert  Stiffness of left hip, not elsewhere classified - Plan: PT plan of care cert/re-cert  Stiffness of right hip, not elsewhere classified - Plan: PT plan of care cert/re-cert     Problem List Patient Active Problem List   Diagnosis Date Noted  . Varicose veins of left lower extremity with complications 33/83/2919  . Knee pain, acute 01/26/2013  . Constipation 12/22/2012  . Neuropathy 12/22/2012  . Prostate hypertrophy 12/22/2012  . Acute blood loss anemia 12/22/2012  . Hyperlipidemia 12/22/2012  . Diabetes ([place]) 12/17/2012  . GERD (gastroesophageal reflux disease) 12/17/2012  . Hypertension 12/17/2012  . Thrombocytopenia, unspecified ([place]) 12/17/2012  . Postoperative anemia due to acute blood loss 12/15/2012  . Hyponatremia 12/15/2012  . OA (osteoarthritis) of knee 12/14/2012    [name], PT 07/30/2017, 4:59 PM  [address] [place], [place], [address] Phone: [phone]   Fax:  [phone]  Name: Keith [name] MRN: [id number] Date of Birth: Sep 22, 1940

## 2017-08-06 DIAGNOSIS — I442 Atrioventricular block, complete: Secondary | ICD-10-CM | POA: Diagnosis not present

## 2017-08-06 DIAGNOSIS — I5030 Unspecified diastolic (congestive) heart failure: Secondary | ICD-10-CM | POA: Diagnosis not present

## 2017-08-06 DIAGNOSIS — I350 Nonrheumatic aortic (valve) stenosis: Secondary | ICD-10-CM | POA: Diagnosis not present

## 2017-08-06 DIAGNOSIS — I4439 Other atrioventricular block: Secondary | ICD-10-CM | POA: Diagnosis not present

## 2017-08-11 ENCOUNTER — Ambulatory Visit: Payer: Medicare Other | Attending: Internal Medicine | Admitting: Physical Therapy

## 2017-08-11 ENCOUNTER — Encounter: Payer: Self-pay | Admitting: Physical Therapy

## 2017-08-11 DIAGNOSIS — M545 Low back pain: Secondary | ICD-10-CM | POA: Diagnosis not present

## 2017-08-11 DIAGNOSIS — R262 Difficulty in walking, not elsewhere classified: Secondary | ICD-10-CM | POA: Diagnosis not present

## 2017-08-11 DIAGNOSIS — M25652 Stiffness of left hip, not elsewhere classified: Secondary | ICD-10-CM | POA: Insufficient documentation

## 2017-08-11 DIAGNOSIS — M25651 Stiffness of right hip, not elsewhere classified: Secondary | ICD-10-CM | POA: Insufficient documentation

## 2017-08-11 DIAGNOSIS — G8929 Other chronic pain: Secondary | ICD-10-CM | POA: Insufficient documentation

## 2017-08-11 DIAGNOSIS — R296 Repeated falls: Secondary | ICD-10-CM | POA: Insufficient documentation

## 2017-08-11 DIAGNOSIS — M6281 Muscle weakness (generalized): Secondary | ICD-10-CM | POA: Diagnosis not present

## 2017-08-11 NOTE — Therapy (Signed)
[place], [place], [address] Phone: [phone]   Fax:  [phone]  Physical Therapy Treatment  Patient Details  Name: Keith [name] MRN: [id number] Date of Birth: 12/04/1940 Referring Provider: Osborne Casco   Encounter Date: 08/11/2017  PT End of Session - 08/11/17 1522    Visit Number  9    Date for PT Re-Evaluation  08/29/17    PT Start Time  1400    PT Stop Time  1445    PT Time Calculation (min)  45 min    Activity Tolerance  Patient tolerated treatment well;Patient limited by fatigue;Patient limited by pain    Behavior During Therapy  [facility] for tasks assessed/performed       Past Medical History:  Diagnosis Date  . Cirrhosis of liver ([place])   . Diabetes mellitus   . Hepatic cancer ([place])   . Hypertension   . Prostate hypertrophy   . Stroke ([place])   . Varicose veins     Past Surgical History:  Procedure Laterality Date  . APPENDECTOMY    . EYE SURGERY     bilateral cataract   . left arm surgery     age 33  . left hand surgery     tendons ripped on left hand  . LIVER RESECTION    . TONSILLECTOMY    . TOTAL KNEE ARTHROPLASTY Left 12/14/2012   Procedure: LEFT TOTAL KNEE ARTHROPLASTY;  Surgeon: Gearlean Alf, MD;  [facility];  Service: Orthopedics;  Laterality: Left;    There were no vitals filed for this visit.  Subjective Assessment - 08/11/17 1520    Subjective  Patient continues to report thigh pain, he is to start pain management in the next few weeks, they are still going to look at his back at the [place]    Currently in Pain?  Yes    Pain Score  8     Pain Location  Leg    Pain Orientation  Right;Left;Upper    Pain Descriptors / Indicators  Aching                       [facility] Adult PT Treatment/Exercise - 08/11/17 0001      Ambulation/Gait   Gait Comments  gait with SPC and light HHA x 180 feet, one instance of LOB but used wall to right himself,  needed two rests in this distance      Lumbar Exercises: Stretches   Passive Hamstring Stretch  3 reps;20 seconds      Lumbar Exercises: Aerobic   Nustep  level 2 x 4 minutes      Lumbar Exercises: Machines for Strengthening   Other Lumbar Machine Exercise  green tband scapular stabilization 3 ways      Manual Therapy   Manual Therapy  Soft tissue mobilization    Manual therapy comments  STM of the quads and ITB and into the calves    Soft tissue mobilization  some STM in the low and mid back               PT Short Term Goals - 07/09/17 1150      PT SHORT TERM GOAL #1   Title  patient to be independent with initial HEP     Status  Achieved        PT Long Term Goals - 08/11/17 1524      PT LONG TERM GOAL #  3   Title  increase LE strength to 4+/5    Status  On-going      PT LONG TERM GOAL #4   Title  be independent with gym exercises    Status  On-going            Plan - 08/11/17 1523    Clinical Impression Statement  Patient continues to report high rating of pain in the legs, he seems to be having more difficulty with mobility as he is using the cane today.  He reports that he will soon start pain management, he reports tha the is also going to [place] to look further at the back to see if it is what is causing the leg pain, he is having difficulty trying exercises    PT Next Visit Plan  will continue to work on his tolerance to activity as well as wathcing for pain    Consulted and Agree with Plan of Care  Patient       Patient will benefit from skilled therapeutic intervention in order to improve the following deficits and impairments:  Abnormal gait, Difficulty walking, Cardiopulmonary status limiting activity, Decreased endurance, Dizziness, Increased muscle spasms, Decreased activity tolerance, Pain, Decreased balance, Impaired flexibility, Decreased strength, Decreased mobility  Visit Diagnosis: Muscle weakness (generalized)  Difficulty in walking, not  elsewhere classified  Chronic low back pain, unspecified back pain laterality, with sciatica presence unspecified  Repeated falls  Stiffness of left hip, not elsewhere classified  Stiffness of right hip, not elsewhere classified     Problem List Patient Active Problem List   Diagnosis Date Noted  . Varicose veins of left lower extremity with complications 78/46/9629  . Knee pain, acute 01/26/2013  . Constipation 12/22/2012  . Neuropathy 12/22/2012  . Prostate hypertrophy 12/22/2012  . Acute blood loss anemia 12/22/2012  . Hyperlipidemia 12/22/2012  . Diabetes ([place]) 12/17/2012  . GERD (gastroesophageal reflux disease) 12/17/2012  . Hypertension 12/17/2012  . Thrombocytopenia, unspecified ([place]) 12/17/2012  . Postoperative anemia due to acute blood loss 12/15/2012  . Hyponatremia 12/15/2012  . OA (osteoarthritis) of knee 12/14/2012    [name], PT 08/11/2017, 3:25 PM  [place] [address] [place], [place], [address] Phone: [phone]   Fax:  [phone]  Name: Keith [name] MRN: [id number] Date of Birth: [birth year]

## 2017-08-14 ENCOUNTER — Ambulatory Visit: Payer: Medicare Other | Admitting: Physical Therapy

## 2017-08-14 ENCOUNTER — Encounter: Payer: Self-pay | Admitting: Physical Therapy

## 2017-08-14 DIAGNOSIS — M545 Low back pain: Secondary | ICD-10-CM | POA: Diagnosis not present

## 2017-08-14 DIAGNOSIS — M25652 Stiffness of left hip, not elsewhere classified: Secondary | ICD-10-CM | POA: Diagnosis not present

## 2017-08-14 DIAGNOSIS — M6281 Muscle weakness (generalized): Secondary | ICD-10-CM

## 2017-08-14 DIAGNOSIS — R262 Difficulty in walking, not elsewhere classified: Secondary | ICD-10-CM | POA: Diagnosis not present

## 2017-08-14 DIAGNOSIS — G8929 Other chronic pain: Secondary | ICD-10-CM

## 2017-08-14 DIAGNOSIS — M25651 Stiffness of right hip, not elsewhere classified: Secondary | ICD-10-CM | POA: Diagnosis not present

## 2017-08-14 DIAGNOSIS — R296 Repeated falls: Secondary | ICD-10-CM

## 2017-08-14 NOTE — Therapy (Signed)
[place], [place], [address] Phone: [phone]   Fax:  [phone]  Physical Therapy Treatment  Patient Details  Name: Keith [name] MRN: [id number] Date of Birth: July 10, 1940 Referring Provider: Osborne Casco   Encounter Date: 08/14/2017  PT End of Session - 08/14/17 1343    Visit Number  10    Date for PT Re-Evaluation  08/29/17    PT Start Time  1100    PT Stop Time  1145    PT Time Calculation (min)  45 min    Activity Tolerance  Patient tolerated treatment well;Patient limited by fatigue;Patient limited by pain    Behavior During Therapy  [facility] for tasks assessed/performed       Past Medical History:  Diagnosis Date  . Cirrhosis of liver ([place])   . Diabetes mellitus   . Hepatic cancer ([place])   . Hypertension   . Prostate hypertrophy   . Stroke ([place])   . Varicose veins     Past Surgical History:  Procedure Laterality Date  . APPENDECTOMY    . EYE SURGERY     bilateral cataract   . left arm surgery     age 65  . left hand surgery     tendons ripped on left hand  . LIVER RESECTION    . TONSILLECTOMY    . TOTAL KNEE ARTHROPLASTY Left 12/14/2012   Procedure: LEFT TOTAL KNEE ARTHROPLASTY;  Surgeon: Gearlean Alf, MD;  [facility];  Service: Orthopedics;  Laterality: Left;    There were no vitals filed for this visit.  Subjective Assessment - 08/14/17 1337    Subjective  Patient willing to do some walking.  But when he does he is unsteady and c/o thigh pain, c/o pain in the shoulders and back area    Currently in Pain?  Yes    Pain Score  8     Pain Location  Leg also c/o shoulder and back pain    Pain Orientation  Right;Left;Upper    Aggravating Factors   seems to have great difficulty walking, has LE neuropathy, may be compensating throughout his body to adjust and steady himself and this adds pain and tnesion                       [facility] Adult PT  Treatment/Exercise - 08/14/17 0001      Ambulation/Gait   Gait Comments  SPC, light HHA from car and to car, no rest, wide BOS, unsteady at time but will use wall or PT to steady himself      Lumbar Exercises: Stretches   Passive Hamstring Stretch  3 reps;20 seconds    Gastroc Stretch  3 reps;20 seconds      Manual Therapy   Manual Therapy  Soft tissue mobilization    Manual therapy comments  STM of the quads and ITB and into the calves    Soft tissue mobilization  STM to the upper traps and the rhomboids               PT Short Term Goals - 07/09/17 1150      PT SHORT TERM GOAL #1   Title  patient to be independent with initial HEP     Status  Achieved        PT Long Term Goals - 08/14/17 1348      PT LONG TERM GOAL #2   Title  Patient to report ability to walk 350 feet without rest or increase in low back pain     Status  Partially Met            Plan - 08/14/17 1346    Clinical Impression Statement  Patient has significant neuropathy, this causes him to really use his shoulders, trunk to compensate and right himself to stop from falling and have better balance, all of this seems to cause tension and spasms as he is working very hard to steady himself., the thighs seems to be very hard and painful, after the treatment he reports that he feels lighter and he feels that he walks better    PT Next Visit Plan  will continue to work on his tolerance to activity as well as wathcing for pain    Consulted and Agree with Plan of Care  Patient       Patient will benefit from skilled therapeutic intervention in order to improve the following deficits and impairments:  Abnormal gait, Difficulty walking, Cardiopulmonary status limiting activity, Decreased endurance, Dizziness, Increased muscle spasms, Decreased activity tolerance, Pain, Decreased balance, Impaired flexibility, Decreased strength, Decreased mobility  Visit Diagnosis: Muscle weakness  (generalized)  Difficulty in walking, not elsewhere classified  Chronic low back pain, unspecified back pain laterality, with sciatica presence unspecified  Repeated falls     Problem List Patient Active Problem List   Diagnosis Date Noted  . Varicose veins of left lower extremity with complications 35/45/6256  . Knee pain, acute 01/26/2013  . Constipation 12/22/2012  . Neuropathy 12/22/2012  . Prostate hypertrophy 12/22/2012  . Acute blood loss anemia 12/22/2012  . Hyperlipidemia 12/22/2012  . Diabetes ([place]) 12/17/2012  . GERD (gastroesophageal reflux disease) 12/17/2012  . Hypertension 12/17/2012  . Thrombocytopenia, unspecified ([place]) 12/17/2012  . Postoperative anemia due to acute blood loss 12/15/2012  . Hyponatremia 12/15/2012  . OA (osteoarthritis) of knee 12/14/2012    [name], PT 08/14/2017, 1:49 PM  [place], [place], [address] Phone: [phone]   Fax:  [phone]  Name: Keith [name] MRN: [id number] Date of Birth: 1940-12-22

## 2017-08-18 ENCOUNTER — Ambulatory Visit: Payer: Medicare Other | Admitting: Physical Therapy

## 2017-08-18 ENCOUNTER — Encounter: Payer: Self-pay | Admitting: Physical Therapy

## 2017-08-18 DIAGNOSIS — M6281 Muscle weakness (generalized): Secondary | ICD-10-CM | POA: Diagnosis not present

## 2017-08-18 DIAGNOSIS — G8929 Other chronic pain: Secondary | ICD-10-CM

## 2017-08-18 DIAGNOSIS — M545 Low back pain: Secondary | ICD-10-CM | POA: Diagnosis not present

## 2017-08-18 DIAGNOSIS — M25652 Stiffness of left hip, not elsewhere classified: Secondary | ICD-10-CM | POA: Diagnosis not present

## 2017-08-18 DIAGNOSIS — M25651 Stiffness of right hip, not elsewhere classified: Secondary | ICD-10-CM | POA: Diagnosis not present

## 2017-08-18 DIAGNOSIS — R262 Difficulty in walking, not elsewhere classified: Secondary | ICD-10-CM | POA: Diagnosis not present

## 2017-08-18 DIAGNOSIS — R296 Repeated falls: Secondary | ICD-10-CM | POA: Diagnosis not present

## 2017-08-18 NOTE — Therapy (Signed)
[place], [place], [address] Phone: [phone]   Fax:  [phone]  Physical Therapy Treatment  Patient Details  Name: Keith [name] MRN: [id number] Date of Birth: 20-Sep-1940 Referring Provider: Osborne Casco   Encounter Date: 08/18/2017  PT End of Session - 08/18/17 1300    Visit Number  11    Date for PT Re-Evaluation  08/29/17    PT Start Time  1059    PT Stop Time  1143    PT Time Calculation (min)  44 min    Activity Tolerance  Patient tolerated treatment well;Patient limited by fatigue;Patient limited by pain    Behavior During Therapy  [facility] for tasks assessed/performed       Past Medical History:  Diagnosis Date  . Cirrhosis of liver ([place])   . Diabetes mellitus   . Hepatic cancer ([place])   . Hypertension   . Prostate hypertrophy   . Stroke ([place])   . Varicose veins     Past Surgical History:  Procedure Laterality Date  . APPENDECTOMY    . EYE SURGERY     bilateral cataract   . left arm surgery     age 45  . left hand surgery     tendons ripped on left hand  . LIVER RESECTION    . TONSILLECTOMY    . TOTAL KNEE ARTHROPLASTY Left 12/14/2012   Procedure: LEFT TOTAL KNEE ARTHROPLASTY;  Surgeon: Gearlean Alf, MD;  [facility];  Service: Orthopedics;  Laterality: Left;    There were no vitals filed for this visit.  Subjective Assessment - 08/18/17 1258    Subjective  Patient comes in much more unsteady, needed support to walk, c/o thigh and knee pain bilaterally, also reports dizzy    Currently in Pain?  Yes    Pain Score  7     Pain Location  Leg    Pain Orientation  Right;Left                       [facility] Adult PT Treatment/Exercise - 08/18/17 0001      Ambulation/Gait   Gait Comments  SPC, heavy HHA for gait from car and to car, he was much more unsteady tending to be back on his heels      Manual Therapy   Manual Therapy  Soft tissue  mobilization    Manual therapy comments  STM of the quads and ITB and into the calves    Soft tissue mobilization  STM to the upper traps and the rhomboids               PT Short Term Goals - 07/09/17 1150      PT SHORT TERM GOAL #1   Title  patient to be independent with initial HEP     Status  Achieved        PT Long Term Goals - 08/14/17 1348      PT LONG TERM GOAL #2   Title  Patient to report ability to walk 350 feet without rest or increase in low back pain     Status  Partially Met            Plan - 08/18/17 1300    Clinical Impression Statement  He thinks he is seeing a different MD this week about his pain, he was much less stable with walking and really seemed to struggle, being back on  heels and needing heavy HHA to keep walking and to stay forward.    PT Next Visit Plan  will continue to work on his tolerance to activity as well as wathcing for pain    Consulted and Agree with Plan of Care  Patient       Patient will benefit from skilled therapeutic intervention in order to improve the following deficits and impairments:  Abnormal gait, Difficulty walking, Cardiopulmonary status limiting activity, Decreased endurance, Dizziness, Increased muscle spasms, Decreased activity tolerance, Pain, Decreased balance, Impaired flexibility, Decreased strength, Decreased mobility  Visit Diagnosis: Muscle weakness (generalized)  Difficulty in walking, not elsewhere classified  Chronic low back pain, unspecified back pain laterality, with sciatica presence unspecified  Repeated falls     Problem List Patient Active Problem List   Diagnosis Date Noted  . Varicose veins of left lower extremity with complications 58/44/1712  . Knee pain, acute 01/26/2013  . Constipation 12/22/2012  . Neuropathy 12/22/2012  . Prostate hypertrophy 12/22/2012  . Acute blood loss anemia 12/22/2012  . Hyperlipidemia 12/22/2012  . Diabetes ([place]) 12/17/2012  . GERD  (gastroesophageal reflux disease) 12/17/2012  . Hypertension 12/17/2012  . Thrombocytopenia, unspecified ([place]) 12/17/2012  . Postoperative anemia due to acute blood loss 12/15/2012  . Hyponatremia 12/15/2012  . OA (osteoarthritis) of knee 12/14/2012    [name], PT 08/18/2017, 1:02 PM  [address] [place], [place], [address] Phone: [phone]   Fax:  [phone]  Name: Keith [name] MRN: [id number] Date of Birth: June 10, 1940

## 2017-08-21 ENCOUNTER — Ambulatory Visit: Payer: Medicare Other | Admitting: Physical Therapy

## 2017-08-21 ENCOUNTER — Encounter: Payer: Self-pay | Admitting: Physical Therapy

## 2017-08-21 DIAGNOSIS — M545 Low back pain: Secondary | ICD-10-CM

## 2017-08-21 DIAGNOSIS — G8929 Other chronic pain: Secondary | ICD-10-CM | POA: Diagnosis not present

## 2017-08-21 DIAGNOSIS — M25651 Stiffness of right hip, not elsewhere classified: Secondary | ICD-10-CM | POA: Diagnosis not present

## 2017-08-21 DIAGNOSIS — R262 Difficulty in walking, not elsewhere classified: Secondary | ICD-10-CM

## 2017-08-21 DIAGNOSIS — M25652 Stiffness of left hip, not elsewhere classified: Secondary | ICD-10-CM | POA: Diagnosis not present

## 2017-08-21 DIAGNOSIS — M6281 Muscle weakness (generalized): Secondary | ICD-10-CM

## 2017-08-21 DIAGNOSIS — R296 Repeated falls: Secondary | ICD-10-CM

## 2017-08-21 NOTE — Therapy (Signed)
[place], [place], [address] Phone: [phone]   Fax:  [phone]  Physical Therapy Treatment  Patient Details  Name: Keith [name] MRN: [id number] Date of Birth: 05/16/1940 Referring Provider: Osborne Casco   Encounter Date: 08/21/2017  PT End of Session - 08/21/17 1138    Visit Number  12    Date for PT Re-Evaluation  08/29/17    PT Start Time  1059    PT Stop Time  1133    PT Time Calculation (min)  34 min    Activity Tolerance  Patient tolerated treatment well;Patient limited by fatigue;Patient limited by pain    Behavior During Therapy  [facility] for tasks assessed/performed       Past Medical History:  Diagnosis Date  . Cirrhosis of liver ([place])   . Diabetes mellitus   . Hepatic cancer ([place])   . Hypertension   . Prostate hypertrophy   . Stroke ([place])   . Varicose veins     Past Surgical History:  Procedure Laterality Date  . APPENDECTOMY    . EYE SURGERY     bilateral cataract   . left arm surgery     age 15  . left hand surgery     tendons ripped on left hand  . LIVER RESECTION    . TONSILLECTOMY    . TOTAL KNEE ARTHROPLASTY Left 12/14/2012   Procedure: LEFT TOTAL KNEE ARTHROPLASTY;  Surgeon: Gearlean Alf, MD;  [facility];  Service: Orthopedics;  Laterality: Left;    There were no vitals filed for this visit.  Subjective Assessment - 08/21/17 1101    Subjective  Patient reports that he will see MD next week for injections in th eback.    Currently in Pain?  Yes    Pain Score  8     Pain Location  Leg    Pain Orientation  Right;Left                       [facility] Adult PT Treatment/Exercise - 08/21/17 0001      Ambulation/Gait   Gait Comments  no device, 2x 180 feet, less HHA today, but still very off balance      Lumbar Exercises: Stretches   Passive Hamstring Stretch  3 reps;20 seconds    ITB Stretch  20 seconds;3 reps    Gastroc Stretch  3  reps;20 seconds      Lumbar Exercises: Aerobic   Nustep  level 2 x 5 minutes      Manual Therapy   Manual Therapy  Soft tissue mobilization    Manual therapy comments  STM of the quads and ITB and into the calves    Soft tissue mobilization  STM to the upper traps and the rhomboids               PT Short Term Goals - 07/09/17 1150      PT SHORT TERM GOAL #1   Title  patient to be independent with initial HEP     Status  Achieved        PT Long Term Goals - 08/21/17 1141      PT LONG TERM GOAL #1   Title  Patient to improve TUG score to 27 seconds     Status  On-going      PT LONG TERM GOAL #2   Title  Patient to report ability to walk 350  feet without rest or increase in low back pain     Status  Partially Met      PT LONG TERM GOAL #3   Title  increase LE strength to 4+/5    Status  Partially Met            Plan - 08/21/17 1139    Clinical Impression Statement  Patient is very tight in the mms all over, especially the thighs and lateral thighs, he is tight in the upper back and the neck area due to having to steady himself so much due to neuropathy and the LOB with walking.  He  walked better today with less HHA, and a little smoother, less loss of balance    PT Next Visit Plan  will continue to work on his tolerance to activity as well as wathcing for pain    Consulted and Agree with Plan of Care  Patient       Patient will benefit from skilled therapeutic intervention in order to improve the following deficits and impairments:  Abnormal gait, Difficulty walking, Cardiopulmonary status limiting activity, Decreased endurance, Dizziness, Increased muscle spasms, Decreased activity tolerance, Pain, Decreased balance, Impaired flexibility, Decreased strength, Decreased mobility  Visit Diagnosis: Muscle weakness (generalized)  Difficulty in walking, not elsewhere classified  Chronic low back pain, unspecified back pain laterality, with sciatica presence  unspecified  Repeated falls     Problem List Patient Active Problem List   Diagnosis Date Noted  . Varicose veins of left lower extremity with complications 11/73/5670  . Knee pain, acute 01/26/2013  . Constipation 12/22/2012  . Neuropathy 12/22/2012  . Prostate hypertrophy 12/22/2012  . Acute blood loss anemia 12/22/2012  . Hyperlipidemia 12/22/2012  . Diabetes ([place]) 12/17/2012  . GERD (gastroesophageal reflux disease) 12/17/2012  . Hypertension 12/17/2012  . Thrombocytopenia, unspecified ([place]) 12/17/2012  . Postoperative anemia due to acute blood loss 12/15/2012  . Hyponatremia 12/15/2012  . OA (osteoarthritis) of knee 12/14/2012    [name], PT 08/21/2017, 11:41 AM  [place] [address] [place], [place], [address] Phone: [phone]   Fax:  [phone]  Name: Keith [name] MRN: [id number] Date of Birth: 10/27/1940

## 2017-08-25 ENCOUNTER — Ambulatory Visit: Payer: Medicare Other | Admitting: Physical Therapy

## 2017-08-26 DIAGNOSIS — Z794 Long term (current) use of insulin: Secondary | ICD-10-CM | POA: Diagnosis not present

## 2017-08-26 DIAGNOSIS — C220 Liver cell carcinoma: Secondary | ICD-10-CM | POA: Diagnosis not present

## 2017-08-26 DIAGNOSIS — K7469 Other cirrhosis of liver: Secondary | ICD-10-CM | POA: Diagnosis not present

## 2017-08-26 DIAGNOSIS — I81 Portal vein thrombosis: Secondary | ICD-10-CM | POA: Diagnosis not present

## 2017-08-26 DIAGNOSIS — E119 Type 2 diabetes mellitus without complications: Secondary | ICD-10-CM | POA: Diagnosis not present

## 2017-08-28 ENCOUNTER — Ambulatory Visit: Payer: Medicare Other | Admitting: Physical Therapy

## 2017-08-28 ENCOUNTER — Encounter: Payer: Self-pay | Admitting: Physical Therapy

## 2017-08-28 DIAGNOSIS — R296 Repeated falls: Secondary | ICD-10-CM | POA: Diagnosis not present

## 2017-08-28 DIAGNOSIS — M6281 Muscle weakness (generalized): Secondary | ICD-10-CM

## 2017-08-28 DIAGNOSIS — M25651 Stiffness of right hip, not elsewhere classified: Secondary | ICD-10-CM | POA: Diagnosis not present

## 2017-08-28 DIAGNOSIS — G8929 Other chronic pain: Secondary | ICD-10-CM | POA: Diagnosis not present

## 2017-08-28 DIAGNOSIS — M545 Low back pain: Secondary | ICD-10-CM | POA: Diagnosis not present

## 2017-08-28 DIAGNOSIS — R262 Difficulty in walking, not elsewhere classified: Secondary | ICD-10-CM | POA: Diagnosis not present

## 2017-08-28 DIAGNOSIS — M25652 Stiffness of left hip, not elsewhere classified: Secondary | ICD-10-CM | POA: Diagnosis not present

## 2017-08-28 NOTE — Therapy (Signed)
[place], [place], [address] Phone: [phone]   Fax:  [phone]  Physical Therapy Treatment  Patient Details  Name: Keith [name] MRN: [id number] Date of Birth: 10-Oct-1940 Referring Provider: Osborne Casco   Encounter Date: 08/28/2017  PT End of Session - 08/28/17 1244    Visit Number  13    Date for PT Re-Evaluation  09/29/17    PT Start Time  1055    PT Stop Time  1143    PT Time Calculation (min)  48 min    Activity Tolerance  Patient tolerated treatment well;Patient limited by fatigue;Patient limited by pain    Behavior During Therapy  [facility] for tasks assessed/performed       Past Medical History:  Diagnosis Date  . Cirrhosis of liver ([place])   . Diabetes mellitus   . Hepatic cancer ([place])   . Hypertension   . Prostate hypertrophy   . Stroke ([place])   . Varicose veins     Past Surgical History:  Procedure Laterality Date  . APPENDECTOMY    . EYE SURGERY     bilateral cataract   . left arm surgery     age 46  . left hand surgery     tendons ripped on left hand  . LIVER RESECTION    . TONSILLECTOMY    . TOTAL KNEE ARTHROPLASTY Left 12/14/2012   Procedure: LEFT TOTAL KNEE ARTHROPLASTY;  Surgeon: Gearlean Alf, MD;  [facility];  Service: Orthopedics;  Laterality: Left;    There were no vitals filed for this visit.  Subjective Assessment - 08/28/17 1242    Subjective  Patient with increased difficulty walking today, c/o left shoulder and back pain, reports that he has knots in this area.    Currently in Pain?  Yes    Pain Score  8     Pain Location  Back    Pain Orientation  Left;Upper    Pain Descriptors / Indicators  Aching;Spasm    Aggravating Factors   walking                       [facility] Adult PT Treatment/Exercise - 08/28/17 0001      Ambulation/Gait   Gait Comments  No device iwth walking but required heavy HHA for balance, 180' x 2, tended to  use the walls more today to keep balance, he tended to lean back onto heels with walking.      Lumbar Exercises: Stretches   Passive Hamstring Stretch  3 reps;20 seconds    Gastroc Stretch  3 reps;20 seconds      Lumbar Exercises: Aerobic   Nustep  level 3 x 5 minutes      Manual Therapy   Manual Therapy  Soft tissue mobilization    Manual therapy comments  STM of the quads and ITB and into the calves    Soft tissue mobilization  STM to the upper traps and the rhomboids               PT Short Term Goals - 07/09/17 1150      PT SHORT TERM GOAL #1   Title  patient to be independent with initial HEP     Status  Achieved        PT Long Term Goals - 08/28/17 1246      PT LONG TERM GOAL #1   Title  Patient to improve  TUG score to 27 seconds     Status  On-going      PT LONG TERM GOAL #2   Title  Patient to report ability to walk 350 feet without rest or increase in low back pain     Status  Partially Met      PT LONG TERM GOAL #3   Title  increase LE strength to 4+/5    Status  Partially Met      PT LONG TERM GOAL #4   Title  be independent with gym exercises    Status  On-going            Plan - 08/28/17 1245    Clinical Impression Statement  Patient really continues to struggle with walking, he is off balance tending to lean to the back on his heels, he tenses his whole body when walking which seems to be causing the incresaed spasms and pain in the back and the legs, he has difficulty with the exercises due to fatigue and pain    PT Next Visit Plan  will continue to work on his tolerance to activity as well as watching for pain    Consulted and Agree with Plan of Care  Patient       Patient will benefit from skilled therapeutic intervention in order to improve the following deficits and impairments:  Abnormal gait, Difficulty walking, Cardiopulmonary status limiting activity, Decreased endurance, Dizziness, Increased muscle spasms, Decreased activity  tolerance, Pain, Decreased balance, Impaired flexibility, Decreased strength, Decreased mobility  Visit Diagnosis: Muscle weakness (generalized) - Plan: PT plan of care cert/re-cert  Difficulty in walking, not elsewhere classified - Plan: PT plan of care cert/re-cert  Chronic low back pain, unspecified back pain laterality, with sciatica presence unspecified - Plan: PT plan of care cert/re-cert  Repeated falls - Plan: PT plan of care cert/re-cert     Problem List Patient Active Problem List   Diagnosis Date Noted  . Varicose veins of left lower extremity with complications 11/57/2620  . Knee pain, acute 01/26/2013  . Constipation 12/22/2012  . Neuropathy 12/22/2012  . Prostate hypertrophy 12/22/2012  . Acute blood loss anemia 12/22/2012  . Hyperlipidemia 12/22/2012  . Diabetes ([place]) 12/17/2012  . GERD (gastroesophageal reflux disease) 12/17/2012  . Hypertension 12/17/2012  . Thrombocytopenia, unspecified ([place]) 12/17/2012  . Postoperative anemia due to acute blood loss 12/15/2012  . Hyponatremia 12/15/2012  . OA (osteoarthritis) of knee 12/14/2012    [name], PT 08/28/2017, 12:48 PM  [place] [address] [place], [place], [address] Phone: [phone]   Fax:  [phone]  Name: Keith [name] MRN: [id number] Date of Birth: 06-Mar-1941

## 2017-09-02 ENCOUNTER — Ambulatory Visit: Payer: Medicare Other | Admitting: Physical Therapy

## 2017-09-02 ENCOUNTER — Encounter: Payer: Self-pay | Admitting: Physical Therapy

## 2017-09-02 DIAGNOSIS — M545 Low back pain: Secondary | ICD-10-CM | POA: Diagnosis not present

## 2017-09-02 DIAGNOSIS — R262 Difficulty in walking, not elsewhere classified: Secondary | ICD-10-CM

## 2017-09-02 DIAGNOSIS — M25652 Stiffness of left hip, not elsewhere classified: Secondary | ICD-10-CM | POA: Diagnosis not present

## 2017-09-02 DIAGNOSIS — R296 Repeated falls: Secondary | ICD-10-CM

## 2017-09-02 DIAGNOSIS — M6281 Muscle weakness (generalized): Secondary | ICD-10-CM

## 2017-09-02 DIAGNOSIS — G8929 Other chronic pain: Secondary | ICD-10-CM | POA: Diagnosis not present

## 2017-09-02 DIAGNOSIS — M25651 Stiffness of right hip, not elsewhere classified: Secondary | ICD-10-CM | POA: Diagnosis not present

## 2017-09-02 NOTE — Therapy (Signed)
[place], [place], [address] Phone: [phone]   Fax:  [phone]  Physical Therapy Treatment  Patient Details  Name: Keith [name] MRN: [id number] Date of Birth: December 27, 1940 Referring Provider: Osborne Casco   Encounter Date: 09/02/2017  PT End of Session - 09/02/17 1230    Visit Number  14    Date for PT Re-Evaluation  09/29/17    PT Start Time  1104    PT Stop Time  1144    PT Time Calculation (min)  40 min    Activity Tolerance  Patient tolerated treatment well;Patient limited by fatigue;Patient limited by pain    Behavior During Therapy  [facility] for tasks assessed/performed       Past Medical History:  Diagnosis Date  . Cirrhosis of liver ([place])   . Diabetes mellitus   . Hepatic cancer ([place])   . Hypertension   . Prostate hypertrophy   . Stroke ([place])   . Varicose veins     Past Surgical History:  Procedure Laterality Date  . APPENDECTOMY    . EYE SURGERY     bilateral cataract   . left arm surgery     age 19  . left hand surgery     tendons ripped on left hand  . LIVER RESECTION    . TONSILLECTOMY    . TOTAL KNEE ARTHROPLASTY Left 12/14/2012   Procedure: LEFT TOTAL KNEE ARTHROPLASTY;  Surgeon: Gearlean Alf, MD;  [facility];  Service: Orthopedics;  Laterality: Left;    There were no vitals filed for this visit.  Subjective Assessment - 09/02/17 1229    Subjective  Patient reports some incresaed left upper trap pain and tightness    Currently in Pain?  Yes    Pain Score  5     Pain Location  Shoulder    Pain Orientation  Left;Upper    Aggravating Factors   stress and wall walking                       [facility] Adult PT Treatment/Exercise - 09/02/17 0001      Ambulation/Gait   Gait Comments  No device with walking but required heavy HHA for balance, 180' x 2, tended to use the walls more today to keep balance, he tended to lean back onto heels with  walking.      Lumbar Exercises: Stretches   Gastroc Stretch  3 reps;20 seconds      Lumbar Exercises: Aerobic   UBE (Upper Arm Bike)  level 4 x 5 minutes    Nustep  level 3 x 5 minutes      Manual Therapy   Manual Therapy  Soft tissue mobilization    Manual therapy comments  STM of the quads and ITB and into the calves    Soft tissue mobilization  STM to the upper traps and the rhomboids               PT Short Term Goals - 07/09/17 1150      PT SHORT TERM GOAL #1   Title  patient to be independent with initial HEP     Status  Achieved        PT Long Term Goals - 08/28/17 1246      PT LONG TERM GOAL #1   Title  Patient to improve TUG score to 27 seconds     Status  On-going  PT LONG TERM GOAL #2   Title  Patient to report ability to walk 350 feet without rest or increase in low back pain     Status  Partially Met      PT LONG TERM GOAL #3   Title  increase LE strength to 4+/5    Status  Partially Met      PT LONG TERM GOAL #4   Title  be independent with gym exercises    Status  On-going            Plan - 09/02/17 1230    Clinical Impression Statement  A little better walking today, less use of the walls and furniture.  He has increased c/o pain in the left upper trap, he has a large knot here, I used my elbow to work on this, to break it up.  He was more willing to try some increased exercises and did well while on them.  Has a significant dizziness when going from sit to stand and has to stabilize about 30 seconds before he tries to walk    PT Next Visit Plan  will continue to work on his tolerance to activity as well as watching for pain    Consulted and Agree with Plan of Care  Patient       Patient will benefit from skilled therapeutic intervention in order to improve the following deficits and impairments:  Abnormal gait, Difficulty walking, Cardiopulmonary status limiting activity, Decreased endurance, Dizziness, Increased muscle spasms,  Decreased activity tolerance, Pain, Decreased balance, Impaired flexibility, Decreased strength, Decreased mobility  Visit Diagnosis: Muscle weakness (generalized)  Difficulty in walking, not elsewhere classified  Chronic low back pain, unspecified back pain laterality, with sciatica presence unspecified  Repeated falls     Problem List Patient Active Problem List   Diagnosis Date Noted  . Varicose veins of left lower extremity with complications 82/51/8984  . Knee pain, acute 01/26/2013  . Constipation 12/22/2012  . Neuropathy 12/22/2012  . Prostate hypertrophy 12/22/2012  . Acute blood loss anemia 12/22/2012  . Hyperlipidemia 12/22/2012  . Diabetes ([place]) 12/17/2012  . GERD (gastroesophageal reflux disease) 12/17/2012  . Hypertension 12/17/2012  . Thrombocytopenia, unspecified ([place]) 12/17/2012  . Postoperative anemia due to acute blood loss 12/15/2012  . Hyponatremia 12/15/2012  . OA (osteoarthritis) of knee 12/14/2012    [name], PT 09/02/2017, 12:33 PM  [place] [address] [place], [place], [address] Phone: [phone]   Fax:  [phone]  Name: Keith [name] MRN: [id number] Date of Birth: 1941-02-21

## 2017-09-03 DIAGNOSIS — R413 Other amnesia: Secondary | ICD-10-CM | POA: Diagnosis not present

## 2017-09-03 DIAGNOSIS — E11319 Type 2 diabetes mellitus with unspecified diabetic retinopathy without macular edema: Secondary | ICD-10-CM | POA: Diagnosis not present

## 2017-09-03 DIAGNOSIS — Z950 Presence of cardiac pacemaker: Secondary | ICD-10-CM | POA: Diagnosis not present

## 2017-09-03 DIAGNOSIS — E7800 Pure hypercholesterolemia, unspecified: Secondary | ICD-10-CM | POA: Diagnosis not present

## 2017-09-04 ENCOUNTER — Encounter: Payer: Self-pay | Admitting: Physical Therapy

## 2017-09-04 ENCOUNTER — Ambulatory Visit: Payer: Medicare Other | Admitting: Physical Therapy

## 2017-09-04 DIAGNOSIS — M6281 Muscle weakness (generalized): Secondary | ICD-10-CM

## 2017-09-04 DIAGNOSIS — G8929 Other chronic pain: Secondary | ICD-10-CM

## 2017-09-04 DIAGNOSIS — M545 Low back pain: Secondary | ICD-10-CM

## 2017-09-04 DIAGNOSIS — R262 Difficulty in walking, not elsewhere classified: Secondary | ICD-10-CM

## 2017-09-04 DIAGNOSIS — R296 Repeated falls: Secondary | ICD-10-CM

## 2017-09-04 NOTE — Therapy (Signed)
[place], [place], [address] Phone: [phone]   Fax:  [phone]  Physical Therapy Treatment  Patient Details  Name: Keith [name] MRN: [id number] Date of Birth: 10-Oct-1940 Referring Provider: Osborne Casco   Encounter Date: 09/04/2017  PT End of Session - 09/04/17 1154    Visit Number  15    Date for PT Re-Evaluation  09/29/17    PT Start Time  1100    PT Stop Time  1145    PT Time Calculation (min)  45 min    Activity Tolerance  Patient tolerated treatment well;Patient limited by fatigue;Patient limited by pain    Behavior During Therapy  [facility] for tasks assessed/performed       Past Medical History:  Diagnosis Date  . Cirrhosis of liver ([place])   . Diabetes mellitus   . Hepatic cancer ([place])   . Hypertension   . Prostate hypertrophy   . Stroke ([place])   . Varicose veins     Past Surgical History:  Procedure Laterality Date  . APPENDECTOMY    . EYE SURGERY     bilateral cataract   . left arm surgery     age 53  . left hand surgery     tendons ripped on left hand  . LIVER RESECTION    . TONSILLECTOMY    . TOTAL KNEE ARTHROPLASTY Left 12/14/2012   Procedure: LEFT TOTAL KNEE ARTHROPLASTY;  Surgeon: Gearlean Alf, MD;  [facility];  Service: Orthopedics;  Laterality: Left;    There were no vitals filed for this visit.  Subjective Assessment - 09/04/17 1153    Subjective  Patient reports he is feeling a little better today, less pain, still c/o tension    Currently in Pain?  Yes    Pain Score  5     Pain Location  Shoulder                       [facility] Adult PT Treatment/Exercise - 09/04/17 0001      Ambulation/Gait   Gait Comments  No device with walking but required heavy HHA for balance, 180' x 2, tended to use the walls more today to keep balance, he tended to lean back onto heels with walking.      Lumbar Exercises: Aerobic   UBE (Upper Arm Bike)   level 4 x 5 minutes    Nustep  level 4 x 8 minutes      Lumbar Exercises: Machines for Strengthening   Other Lumbar Machine Exercise  red tband ankle DF/PF    Other Lumbar Machine Exercise  green tband scapular stabilization 3 ways      Manual Therapy   Manual Therapy  Soft tissue mobilization    Manual therapy comments  STM of the quads and ITB and into the calves    Soft tissue mobilization  STM to the upper traps and the rhomboids               PT Short Term Goals - 07/09/17 1150      PT SHORT TERM GOAL #1   Title  patient to be independent with initial HEP     Status  Achieved        PT Long Term Goals - 09/04/17 1156      PT LONG TERM GOAL #1   Title  Patient to improve TUG score to 27 seconds  Status  Partially Met      PT LONG TERM GOAL #3   Title  increase LE strength to 4+/5    Status  Partially Met            Plan - 09/04/17 1155    Clinical Impression Statement  Patient did more exercises today, no increased c/o pain, just c/o fatigue.  He did well with a 2HHA gait where I lead him, better step length and less LOB.  He remains very tight in all the mms but again did not have increased difficulty with the added exercises    PT Next Visit Plan  will continue to work on his tolerance to activity as well as watching for pain    Consulted and Agree with Plan of Care  Patient       Patient will benefit from skilled therapeutic intervention in order to improve the following deficits and impairments:  Abnormal gait, Difficulty walking, Cardiopulmonary status limiting activity, Decreased endurance, Dizziness, Increased muscle spasms, Decreased activity tolerance, Pain, Decreased balance, Impaired flexibility, Decreased strength, Decreased mobility  Visit Diagnosis: Muscle weakness (generalized)  Difficulty in walking, not elsewhere classified  Chronic low back pain, unspecified back pain laterality, with sciatica presence unspecified  Repeated  falls     Problem List Patient Active Problem List   Diagnosis Date Noted  . Varicose veins of left lower extremity with complications 82/50/0370  . Knee pain, acute 01/26/2013  . Constipation 12/22/2012  . Neuropathy 12/22/2012  . Prostate hypertrophy 12/22/2012  . Acute blood loss anemia 12/22/2012  . Hyperlipidemia 12/22/2012  . Diabetes ([place]) 12/17/2012  . GERD (gastroesophageal reflux disease) 12/17/2012  . Hypertension 12/17/2012  . Thrombocytopenia, unspecified ([place]) 12/17/2012  . Postoperative anemia due to acute blood loss 12/15/2012  . Hyponatremia 12/15/2012  . OA (osteoarthritis) of knee 12/14/2012    [name], PT 09/04/2017, 12:01 PM  [place] [address] [place], [place], [address] Phone: [phone]   Fax:  [phone]  Name: Keith [name] MRN: [id number] Date of Birth: [birth year]

## 2017-09-16 ENCOUNTER — Ambulatory Visit: Payer: Medicare Other | Attending: Internal Medicine | Admitting: Physical Therapy

## 2017-09-16 ENCOUNTER — Encounter: Payer: Self-pay | Admitting: Physical Therapy

## 2017-09-16 DIAGNOSIS — R296 Repeated falls: Secondary | ICD-10-CM | POA: Diagnosis present

## 2017-09-16 DIAGNOSIS — M545 Low back pain: Secondary | ICD-10-CM | POA: Insufficient documentation

## 2017-09-16 DIAGNOSIS — M6281 Muscle weakness (generalized): Secondary | ICD-10-CM | POA: Diagnosis not present

## 2017-09-16 DIAGNOSIS — G8929 Other chronic pain: Secondary | ICD-10-CM | POA: Diagnosis present

## 2017-09-16 DIAGNOSIS — R262 Difficulty in walking, not elsewhere classified: Secondary | ICD-10-CM | POA: Insufficient documentation

## 2017-09-16 NOTE — Therapy (Signed)
[place], [place], [address] Phone: [phone]   Fax:  [phone]  Physical Therapy Treatment  Patient Details  Name: Keith [name] MRN: [id number] Date of Birth: 08-24-1940 Referring Provider: Osborne Casco   Encounter Date: 09/16/2017  PT End of Session - 09/16/17 1351    Visit Number  16    Date for PT Re-Evaluation  09/29/17    PT Start Time  1100    PT Stop Time  1150    PT Time Calculation (min)  50 min    Activity Tolerance  Patient tolerated treatment well;Patient limited by fatigue;Patient limited by pain    Behavior During Therapy  [facility] for tasks assessed/performed       Past Medical History:  Diagnosis Date  . Cirrhosis of liver ([place])   . Diabetes mellitus   . Hepatic cancer ([place])   . Hypertension   . Prostate hypertrophy   . Stroke ([place])   . Varicose veins     Past Surgical History:  Procedure Laterality Date  . APPENDECTOMY    . EYE SURGERY     bilateral cataract   . left arm surgery     age 31  . left hand surgery     tendons ripped on left hand  . LIVER RESECTION    . TONSILLECTOMY    . TOTAL KNEE ARTHROPLASTY Left 12/14/2012   Procedure: LEFT TOTAL KNEE ARTHROPLASTY;  Surgeon: Gearlean Alf, MD;  [facility];  Service: Orthopedics;  Laterality: Left;    There were no vitals filed for this visit.  Subjective Assessment - 09/16/17 1348    Subjective  Patient reports that injections in his back have helped some, he does report increased pain and spasms in the upper trap and shoulder    Currently in Pain?  Yes    Pain Score  4     Pain Location  Shoulder    Pain Orientation  Left;Upper    Aggravating Factors   bracing himself                       [facility] Adult PT Treatment/Exercise - 09/16/17 0001      Ambulation/Gait   Gait Comments  No device with walking but required heavy HHA for balance, 180' x 2, tended to use the walls more today  to keep balance, he tended to lean back onto heels with walking.      Lumbar Exercises: Aerobic   UBE (Upper Arm Bike)  level 4 x 5 minutes    Nustep  level 4 x 8 minutes      Lumbar Exercises: Machines for Strengthening   Other Lumbar Machine Exercise  green tband scapular stabilization 3 ways      Lumbar Exercises: Standing   Heel Raises  15 reps    Other Standing Lumbar Exercises  marches x 20      Manual Therapy   Manual Therapy  Soft tissue mobilization    Soft tissue mobilization  STM to the upper traps and the rhomboids               PT Short Term Goals - 07/09/17 1150      PT SHORT TERM GOAL #1   Title  patient to be independent with initial HEP     Status  Achieved        PT Long Term Goals - 09/04/17 1156  PT LONG TERM GOAL #1   Title  Patient to improve TUG score to 27 seconds     Status  Partially Met      PT LONG TERM GOAL #3   Title  increase LE strength to 4+/5    Status  Partially Met            Plan - 09/16/17 1351    Clinical Impression Statement  Patient with some decreased c/o pain in the legs and the back after injections in the back, reports that he is now feeling shoulders "much more"  He was able to tolerate some increased exercises, has a significant knot in the left upper trap    PT Next Visit Plan  will continue to work on his tolerance to activity as well as watching for pain    Consulted and Agree with Plan of Care  Patient       Patient will benefit from skilled therapeutic intervention in order to improve the following deficits and impairments:  Abnormal gait, Difficulty walking, Cardiopulmonary status limiting activity, Decreased endurance, Dizziness, Increased muscle spasms, Decreased activity tolerance, Pain, Decreased balance, Impaired flexibility, Decreased strength, Decreased mobility  Visit Diagnosis: Muscle weakness (generalized)  Difficulty in walking, not elsewhere classified  Chronic low back pain,  unspecified back pain laterality, with sciatica presence unspecified  Repeated falls     Problem List Patient Active Problem List   Diagnosis Date Noted  . Varicose veins of left lower extremity with complications 03/04/2016  . Knee pain, acute 01/26/2013  . Constipation 12/22/2012  . Neuropathy 12/22/2012  . Prostate hypertrophy 12/22/2012  . Acute blood loss anemia 12/22/2012  . Hyperlipidemia 12/22/2012  . Diabetes ([facility]) 12/17/2012  . GERD (gastroesophageal reflux disease) 12/17/2012  . Hypertension 12/17/2012  . Thrombocytopenia, unspecified ([facility]) 12/17/2012  . Postoperative anemia due to acute blood loss 12/15/2012  . Hyponatremia 12/15/2012  . OA (osteoarthritis) of knee 12/14/2012    , W., PT 09/16/2017, 1:57 PM  [facility] [address] [place], [place], [address] Phone: [phone]   Fax:  [phone]  Name: Keith [name] MRN: [id number] Date of Birth: 11/08/1940   

## 2017-09-17 DIAGNOSIS — L03114 Cellulitis of left upper limb: Secondary | ICD-10-CM | POA: Diagnosis not present

## 2017-09-17 DIAGNOSIS — Z6841 Body Mass Index (BMI) 40.0 and over, adult: Secondary | ICD-10-CM | POA: Diagnosis not present

## 2017-09-18 ENCOUNTER — Encounter: Payer: Self-pay | Admitting: Physical Therapy

## 2017-09-18 ENCOUNTER — Ambulatory Visit: Payer: Medicare Other | Admitting: Physical Therapy

## 2017-09-18 DIAGNOSIS — M545 Low back pain: Secondary | ICD-10-CM

## 2017-09-18 DIAGNOSIS — R296 Repeated falls: Secondary | ICD-10-CM

## 2017-09-18 DIAGNOSIS — M6281 Muscle weakness (generalized): Secondary | ICD-10-CM

## 2017-09-18 DIAGNOSIS — G8929 Other chronic pain: Secondary | ICD-10-CM

## 2017-09-18 DIAGNOSIS — R262 Difficulty in walking, not elsewhere classified: Secondary | ICD-10-CM

## 2017-09-18 NOTE — Therapy (Signed)
[place], [place], [address] Phone: [phone]   Fax:  [phone]  Physical Therapy Treatment  Patient Details  Name: Keith [name] MRN: [id number] Date of Birth: September 16, 1940 Referring Provider: Osborne Casco   Encounter Date: 09/18/2017  PT End of Session - 09/18/17 1159    Visit Number  17    Date for PT Re-Evaluation  09/29/17    PT Start Time  1055    PT Stop Time  1144    PT Time Calculation (min)  49 min    Activity Tolerance  Patient tolerated treatment well;Patient limited by fatigue;Patient limited by pain    Behavior During Therapy  [facility] for tasks assessed/performed       Past Medical History:  Diagnosis Date  . Cirrhosis of liver ([place])   . Diabetes mellitus   . Hepatic cancer ([place])   . Hypertension   . Prostate hypertrophy   . Stroke ([place])   . Varicose veins     Past Surgical History:  Procedure Laterality Date  . APPENDECTOMY    . EYE SURGERY     bilateral cataract   . left arm surgery     age 56  . left hand surgery     tendons ripped on left hand  . LIVER RESECTION    . TONSILLECTOMY    . TOTAL KNEE ARTHROPLASTY Left 12/14/2012   Procedure: LEFT TOTAL KNEE ARTHROPLASTY;  Surgeon: Gearlean Alf, MD;  [facility];  Service: Orthopedics;  Laterality: Left;    There were no vitals filed for this visit.  Subjective Assessment - 09/18/17 1108    Subjective  Patient reports that he did not sleep well, he is having more difficulty walking and asked to use W/C to get into the clinic    Currently in Pain?  Yes    Pain Score  5     Pain Location  Shoulder    Pain Orientation  Left                       [facility] Adult PT Treatment/Exercise - 09/18/17 0001      Lumbar Exercises: Aerobic   UBE (Upper Arm Bike)  level 2 x 4 minutes    Nustep  level 3 x 8 minutes      Manual Therapy   Manual Therapy  Soft tissue mobilization    Manual therapy  comments  STM of the quads and ITB and into the calves    Soft tissue mobilization  STM to the upper traps and the rhomboids       Trigger Point Dry Needling - 09/18/17 1159    Consent Given?  Yes    Education Handout Provided  Yes    Muscles Treated Upper Body  Upper trapezius    Upper Trapezius Response  Palpable increased muscle length             PT Short Term Goals - 07/09/17 1150      PT SHORT TERM GOAL #1   Title  patient to be independent with initial HEP     Status  Achieved        PT Long Term Goals - 09/04/17 1156      PT LONG TERM GOAL #1   Title  Patient to improve TUG score to 27 seconds     Status  Partially Met      PT LONG TERM GOAL #  3   Title  increase LE strength to 4+/5    Status  Partially Met            Plan - 09/18/17 1200    Clinical Impression Statement  Patietn did not seem to feel well today, reported he was tired and had no energy, I spoke with his wife and asked about any changes to medication, she reported that she had a call into the MD, he also has a spot on the left upper arm that he says is cellutlits from him giving himself insulin injection., he reported that he could not walk inot or out of the clinic today, needed mod A to transfer    PT Next Visit Plan  asked wife to follow up with MD call she has made since it seems that he is not doing as well today    Consulted and Agree with Plan of Care  Patient       Patient will benefit from skilled therapeutic intervention in order to improve the following deficits and impairments:  Abnormal gait, Difficulty walking, Cardiopulmonary status limiting activity, Decreased endurance, Dizziness, Increased muscle spasms, Decreased activity tolerance, Pain, Decreased balance, Impaired flexibility, Decreased strength, Decreased mobility  Visit Diagnosis: Muscle weakness (generalized)  Difficulty in walking, not elsewhere classified  Chronic low back pain, unspecified back pain  laterality, with sciatica presence unspecified  Repeated falls     Problem List Patient Active Problem List   Diagnosis Date Noted  . Varicose veins of left lower extremity with complications 71/85/5015  . Knee pain, acute 01/26/2013  . Constipation 12/22/2012  . Neuropathy 12/22/2012  . Prostate hypertrophy 12/22/2012  . Acute blood loss anemia 12/22/2012  . Hyperlipidemia 12/22/2012  . Diabetes ([place]) 12/17/2012  . GERD (gastroesophageal reflux disease) 12/17/2012  . Hypertension 12/17/2012  . Thrombocytopenia, unspecified ([place]) 12/17/2012  . Postoperative anemia due to acute blood loss 12/15/2012  . Hyponatremia 12/15/2012  . OA (osteoarthritis) of knee 12/14/2012    [name], PT 09/18/2017, 12:03 PM  [place] [address] [place], [place], [address] Phone: [phone]   Fax:  [phone]  Name: Keith [name] MRN: [id number] Date of Birth: June 29, 1940

## 2017-09-18 NOTE — Patient Instructions (Signed)

## 2017-09-25 ENCOUNTER — Ambulatory Visit: Payer: Medicare Other | Admitting: Physical Therapy

## 2017-09-25 ENCOUNTER — Encounter: Payer: Self-pay | Admitting: Physical Therapy

## 2017-09-25 DIAGNOSIS — G8929 Other chronic pain: Secondary | ICD-10-CM

## 2017-09-25 DIAGNOSIS — M6281 Muscle weakness (generalized): Secondary | ICD-10-CM | POA: Diagnosis not present

## 2017-09-25 DIAGNOSIS — M545 Low back pain: Secondary | ICD-10-CM

## 2017-09-25 DIAGNOSIS — R262 Difficulty in walking, not elsewhere classified: Secondary | ICD-10-CM

## 2017-09-25 DIAGNOSIS — R296 Repeated falls: Secondary | ICD-10-CM

## 2017-09-25 NOTE — Therapy (Signed)
[place] [address] [place], [place], [address] Phone: [phone]   Fax:  [phone]  Physical Therapy Treatment  Patient Details  Name: OSBY SWEETIN MRN: [id number] Date of Birth: [birth year] Referring Provider: Osborne Casco   Encounter Date: 09/25/2017  PT End of Session - 09/25/17 1637    Visit Number  18    Date for PT Re-Evaluation  09/29/17    PT Start Time  1520    PT Stop Time  1604    PT Time Calculation (min)  44 min    Activity Tolerance  Patient tolerated treatment well;Patient limited by fatigue    Behavior During Therapy  [facility] for tasks assessed/performed       Past Medical History:  Diagnosis Date  . Cirrhosis of liver ([place])   . Diabetes mellitus   . Hepatic cancer ([place])   . Hypertension   . Prostate hypertrophy   . Stroke ([place])   . Varicose veins     Past Surgical History:  Procedure Laterality Date  . APPENDECTOMY    . EYE SURGERY     bilateral cataract   . left arm surgery     age 77  . left hand surgery     tendons ripped on left hand  . LIVER RESECTION    . TONSILLECTOMY    . TOTAL KNEE ARTHROPLASTY Left 12/14/2012   Procedure: LEFT TOTAL KNEE ARTHROPLASTY;  Surgeon: Gearlean Alf, MD;  Location: [facility];  Service: Orthopedics;  Laterality: Left;    There were no vitals filed for this visit.  Subjective Assessment - 09/25/17 1632    Subjective  Patient reports that he is feeling a little better, less pain and better walking today    Currently in Pain?  Yes    Pain Score  4     Pain Location  Shoulder and legs    Pain Orientation  Left                       [facility] Adult PT Treatment/Exercise - 09/25/17 0001      Ambulation/Gait   Gait Comments  no device HHA, 200 feet x 2      Lumbar Exercises: Aerobic   UBE (Upper Arm Bike)  level 5 x 4 minutes    Nustep  level 4 x 6 minutes      Lumbar Exercises: Machines for Strengthening   Other Lumbar Machine  Exercise  red tband ankle DF/PF    Other Lumbar Machine Exercise  green tband scapular stabilization 3 ways      Lumbar Exercises: Seated   Other Seated Lumbar Exercises  marches 2x10 each, ball squeeze, toe taps      Knee/Hip Exercises: Seated   Long Arc Quad  2 sets;10 reps;Weights    Long Arc Quad Weight  3 lbs.      Manual Therapy   Manual Therapy  Soft tissue mobilization    Manual therapy comments  STM of the quads and ITB and into the calves    Soft tissue mobilization  STM to the upper traps and the rhomboids               PT Short Term Goals - 07/09/17 1150      PT SHORT TERM GOAL #1   Title  patient to be independent with initial HEP     Status  Achieved        PT  Long Term Goals - 09/25/17 1638      PT LONG TERM GOAL #1   Title  Patient to improve TUG score to 27 seconds     Status  Partially Met      PT LONG TERM GOAL #4   Title  be independent with gym exercises    Status  Partially Met            Plan - 09/25/17 1637    Clinical Impression Statement  Patient overall doing better, today, tolerated a return to exercises, needed cues for the exercises and to get good mm contraction.  His shoulders and legs remain very tight.    PT Next Visit Plan  try to continue with the exercises    Consulted and Agree with Plan of Care  Patient       Patient will benefit from skilled therapeutic intervention in order to improve the following deficits and impairments:  Abnormal gait, Difficulty walking, Cardiopulmonary status limiting activity, Decreased endurance, Dizziness, Increased muscle spasms, Decreased activity tolerance, Pain, Decreased balance, Impaired flexibility, Decreased strength, Decreased mobility  Visit Diagnosis: Muscle weakness (generalized)  Difficulty in walking, not elsewhere classified  Chronic low back pain, unspecified back pain laterality, with sciatica presence unspecified  Repeated falls     Problem List Patient Active  Problem List   Diagnosis Date Noted  . Varicose veins of left lower extremity with complications 22/57/5051  . Knee pain, acute 01/26/2013  . Constipation 12/22/2012  . Neuropathy 12/22/2012  . Prostate hypertrophy 12/22/2012  . Acute blood loss anemia 12/22/2012  . Hyperlipidemia 12/22/2012  . Diabetes ([place]) 12/17/2012  . GERD (gastroesophageal reflux disease) 12/17/2012  . Hypertension 12/17/2012  . Thrombocytopenia, unspecified ([place]) 12/17/2012  . Postoperative anemia due to acute blood loss 12/15/2012  . Hyponatremia 12/15/2012  . OA (osteoarthritis) of knee 12/14/2012    [name], PT 09/25/2017, 4:39 PM  [address] [place], [place], [address] Phone: [phone]   Fax:  [phone]  Name: JANCE SIEK MRN: [id number] Date of Birth: 1940/06/11

## 2017-09-30 ENCOUNTER — Ambulatory Visit: Payer: Medicare Other | Admitting: Physical Therapy

## 2017-09-30 ENCOUNTER — Encounter: Payer: Self-pay | Admitting: Physical Therapy

## 2017-09-30 DIAGNOSIS — G8929 Other chronic pain: Secondary | ICD-10-CM

## 2017-09-30 DIAGNOSIS — R262 Difficulty in walking, not elsewhere classified: Secondary | ICD-10-CM

## 2017-09-30 DIAGNOSIS — M6281 Muscle weakness (generalized): Secondary | ICD-10-CM | POA: Diagnosis not present

## 2017-09-30 DIAGNOSIS — M545 Low back pain: Secondary | ICD-10-CM

## 2017-09-30 DIAGNOSIS — R296 Repeated falls: Secondary | ICD-10-CM

## 2017-09-30 NOTE — Therapy (Signed)
[place] [address] [place], [place], [address] Phone: [phone]   Fax:  [phone]  Physical Therapy Treatment  Patient Details  Name: Keith [name] MRN: [id number] Date of Birth: 06-08-1940 Referring Provider: Osborne Casco   Encounter Date: 09/30/2017  PT End of Session - 09/30/17 1300    Visit Number  19    Date for PT Re-Evaluation  10/31/17    PT Start Time  1013    PT Stop Time  1102    PT Time Calculation (min)  49 min    Activity Tolerance  Patient tolerated treatment well;Patient limited by fatigue    Behavior During Therapy  [facility] for tasks assessed/performed       Past Medical History:  Diagnosis Date  . Cirrhosis of liver ([place])   . Diabetes mellitus   . Hepatic cancer ([place])   . Hypertension   . Prostate hypertrophy   . Stroke ([place])   . Varicose veins     Past Surgical History:  Procedure Laterality Date  . APPENDECTOMY    . EYE SURGERY     bilateral cataract   . left arm surgery     age 31  . left hand surgery     tendons ripped on left hand  . LIVER RESECTION    . TONSILLECTOMY    . TOTAL KNEE ARTHROPLASTY Left 12/14/2012   Procedure: LEFT TOTAL KNEE ARTHROPLASTY;  Surgeon: Gearlean Alf, MD;  Location: [facility];  Service: Orthopedics;  Laterality: Left;    There were no vitals filed for this visit.  Subjective Assessment - 09/30/17 1255    Subjective  Patient again reports feeling better and is walking better today    Currently in Pain?  Yes    Pain Score  3     Pain Location  Shoulder and legs    Aggravating Factors   Patient frustrated saying he does no know why he gets so sore and tight                       [facility] Adult PT Treatment/Exercise - 09/30/17 0001      Ambulation/Gait   Gait Comments  no device HHA, 200 feet x 2, only one instance where he really needed help righting himself      Lumbar Exercises: Aerobic   UBE (Upper Arm Bike)  level 5 x 5  minutes    Nustep  level 4 x 6 minutes      Lumbar Exercises: Machines for Strengthening   Other Lumbar Machine Exercise  red tband ankle DF/PF    Other Lumbar Machine Exercise  green tband scapular stabilization 3 ways      Lumbar Exercises: Standing   Heel Raises  15 reps    Other Standing Lumbar Exercises  marches x 20      Manual Therapy   Manual Therapy  Soft tissue mobilization    Manual therapy comments  STM of the quads and ITB and into the calves    Soft tissue mobilization  STM to the upper traps and the rhomboids               PT Short Term Goals - 07/09/17 1150      PT SHORT TERM GOAL #1   Title  patient to be independent with initial HEP     Status  Achieved        PT Long Term Goals -  09/30/17 1303      PT LONG TERM GOAL #1   Title  Patient to improve TUG score to 27 seconds     Status  Partially Met      PT LONG TERM GOAL #2   Title  Patient to report ability to walk 350 feet without rest or increase in low back pain     Status  Partially Met      PT LONG TERM GOAL #3   Title  increase LE strength to 4+/5    Status  Achieved      PT LONG TERM GOAL #4   Title  be independent with gym exercises    Status  Partially Met            Plan - 09/30/17 1301    Clinical Impression Statement  Patient again reports feeling better, he looks better and is moving easier and using less walls.  He reports that with activity he gets more tightness in the upper traps and the legs.  Seems to tolerate the exercises with c/o fatigue and some soreness, cues needed to stay on taks and get good mm activation    PT Next Visit Plan  Would like to continue to try to get him more active, he reports that he is unable to do much at home    Consulted and Agree with Plan of Care  Patient       Patient will benefit from skilled therapeutic intervention in order to improve the following deficits and impairments:  Abnormal gait, Difficulty walking, Cardiopulmonary status  limiting activity, Decreased endurance, Dizziness, Increased muscle spasms, Decreased activity tolerance, Pain, Decreased balance, Impaired flexibility, Decreased strength, Decreased mobility  Visit Diagnosis: Muscle weakness (generalized)  Difficulty in walking, not elsewhere classified  Chronic low back pain, unspecified back pain laterality, with sciatica presence unspecified  Repeated falls     Problem List Patient Active Problem List   Diagnosis Date Noted  . Varicose veins of left lower extremity with complications 87/56/4332  . Knee pain, acute 01/26/2013  . Constipation 12/22/2012  . Neuropathy 12/22/2012  . Prostate hypertrophy 12/22/2012  . Acute blood loss anemia 12/22/2012  . Hyperlipidemia 12/22/2012  . Diabetes ([place]) 12/17/2012  . GERD (gastroesophageal reflux disease) 12/17/2012  . Hypertension 12/17/2012  . Thrombocytopenia, unspecified ([place]) 12/17/2012  . Postoperative anemia due to acute blood loss 12/15/2012  . Hyponatremia 12/15/2012  . OA (osteoarthritis) of knee 12/14/2012    [name], PT 09/30/2017, 1:04 PM  [address] [place], [place], [address] Phone: [phone]   Fax:  [phone]  Name: DEMYAN FUGATE MRN: [id number] Date of Birth: 1940-05-23

## 2017-10-02 ENCOUNTER — Encounter: Payer: Self-pay | Admitting: Physical Therapy

## 2017-10-02 ENCOUNTER — Ambulatory Visit: Payer: Medicare Other | Admitting: Physical Therapy

## 2017-10-02 DIAGNOSIS — M6281 Muscle weakness (generalized): Secondary | ICD-10-CM

## 2017-10-02 DIAGNOSIS — R262 Difficulty in walking, not elsewhere classified: Secondary | ICD-10-CM

## 2017-10-02 DIAGNOSIS — C8590 Non-Hodgkin lymphoma, unspecified, unspecified site: Secondary | ICD-10-CM | POA: Diagnosis not present

## 2017-10-02 DIAGNOSIS — R296 Repeated falls: Secondary | ICD-10-CM

## 2017-10-02 DIAGNOSIS — M545 Low back pain: Secondary | ICD-10-CM

## 2017-10-02 DIAGNOSIS — G8929 Other chronic pain: Secondary | ICD-10-CM

## 2017-10-02 NOTE — Therapy (Signed)
[place] [address] [place], [place], [address] Phone: [phone]   Fax:  [phone]  Physical Therapy Treatment Progress Note Reporting Period 08/14/2017 to 10/02/17 visits 10-20  See note below for Objective Data and Assessment of Progress/Goals.      Patient Details  Name: Keith [name] MRN: [id number] Date of Birth: [birth year] Referring Provider: Osborne Casco   Encounter Date: 10/02/2017  PT End of Session - 10/02/17 1035    Visit Number  20    Date for PT Re-Evaluation  10/31/17    PT Start Time  0928    PT Stop Time  1016    PT Time Calculation (min)  48 min    Activity Tolerance  Patient limited by pain    Behavior During Therapy  [facility] for tasks assessed/performed       Past Medical History:  Diagnosis Date  . Cirrhosis of liver ([place])   . Diabetes mellitus   . Hepatic cancer ([place])   . Hypertension   . Prostate hypertrophy   . Stroke ([place])   . Varicose veins     Past Surgical History:  Procedure Laterality Date  . APPENDECTOMY    . EYE SURGERY     bilateral cataract   . left arm surgery     age 40  . left hand surgery     tendons ripped on left hand  . LIVER RESECTION    . TONSILLECTOMY    . TOTAL KNEE ARTHROPLASTY Left 12/14/2012   Procedure: LEFT TOTAL KNEE ARTHROPLASTY;  Surgeon: Gearlean Alf, MD;  [facility];  Service: Orthopedics;  Laterality: Left;    There were no vitals filed for this visit.  Subjective Assessment - 10/02/17 0935    Subjective  Patient reports increased thigh tightness and pain today, having some incresaed difficulty walking today    Currently in Pain?  Yes    Pain Score  5     Pain Location  Leg    Pain Orientation  Left;Right;Upper    Aggravating Factors   walking increases the upper thigh and quad pain                       [facility] Adult PT Treatment/Exercise - 10/02/17 0001      Ambulation/Gait   Gait Comments  no device  HHA, 200 feet x 2      Lumbar Exercises: Aerobic   UBE (Upper Arm Bike)  level 5 x 6 minutes      Lumbar Exercises: Standing   Heel Raises  15 reps    Other Standing Lumbar Exercises  marches x 20      Knee/Hip Exercises: Seated   Long Arc Quad  2 sets;10 reps;Weights    Hamstring Curl  Both;2 sets;15 reps    Hamstring Limitations  green tband       Manual Therapy   Manual Therapy  Soft tissue mobilization    Manual therapy comments  STM of the quads and ITB and into the calves    Soft tissue mobilization  STM to the upper traps and the rhomboids               PT Short Term Goals - 07/09/17 1150      PT SHORT TERM GOAL #1   Title  patient to be independent with initial HEP     Status  Achieved        PT  Long Term Goals - 10/02/17 1050      PT LONG TERM GOAL #1   Title  Patient to improve TUG score to 27 seconds     Status  Partially Met      PT LONG TERM GOAL #4   Title  be independent with gym exercises    Status  Partially Met            Plan - 10/02/17 1049    Clinical Impression Statement  Patient with increased thigh pain today, he did not tolerate much exercise but did it.  Very tight in the quads and ITB, also very tight in the lfet teres    PT Next Visit Plan  Would like to continue to try to get him more active, he reports that he is unable to do much at home    Consulted and Agree with Plan of Care  Patient       Patient will benefit from skilled therapeutic intervention in order to improve the following deficits and impairments:  Abnormal gait, Difficulty walking, Cardiopulmonary status limiting activity, Decreased endurance, Dizziness, Increased muscle spasms, Decreased activity tolerance, Pain, Decreased balance, Impaired flexibility, Decreased strength, Decreased mobility  Visit Diagnosis: Muscle weakness (generalized)  Difficulty in walking, not elsewhere classified  Chronic low back pain, unspecified back pain laterality, with  sciatica presence unspecified  Repeated falls     Problem List Patient Active Problem List   Diagnosis Date Noted  . Varicose veins of left lower extremity with complications 48/54/6270  . Knee pain, acute 01/26/2013  . Constipation 12/22/2012  . Neuropathy 12/22/2012  . Prostate hypertrophy 12/22/2012  . Acute blood loss anemia 12/22/2012  . Hyperlipidemia 12/22/2012  . Diabetes ([place]) 12/17/2012  . GERD (gastroesophageal reflux disease) 12/17/2012  . Hypertension 12/17/2012  . Thrombocytopenia, unspecified ([place]) 12/17/2012  . Postoperative anemia due to acute blood loss 12/15/2012  . Hyponatremia 12/15/2012  . OA (osteoarthritis) of knee 12/14/2012    [name], PT 10/02/2017, 10:51 AM  [address] [place], [place], [address] Phone: [phone]   Fax:  [phone]  Name: KHALIFA KNECHT MRN: [id number] Date of Birth: [birth year]

## 2017-10-10 DIAGNOSIS — Z794 Long term (current) use of insulin: Secondary | ICD-10-CM | POA: Diagnosis not present

## 2017-10-10 DIAGNOSIS — E119 Type 2 diabetes mellitus without complications: Secondary | ICD-10-CM | POA: Diagnosis not present

## 2017-10-10 DIAGNOSIS — G8929 Other chronic pain: Secondary | ICD-10-CM | POA: Diagnosis not present

## 2017-10-10 DIAGNOSIS — Z87891 Personal history of nicotine dependence: Secondary | ICD-10-CM | POA: Diagnosis not present

## 2017-10-10 DIAGNOSIS — R188 Other ascites: Secondary | ICD-10-CM | POA: Diagnosis not present

## 2017-10-10 DIAGNOSIS — Z950 Presence of cardiac pacemaker: Secondary | ICD-10-CM | POA: Diagnosis not present

## 2017-10-10 DIAGNOSIS — Z885 Allergy status to narcotic agent status: Secondary | ICD-10-CM | POA: Diagnosis not present

## 2017-10-10 DIAGNOSIS — I8390 Asymptomatic varicose veins of unspecified lower extremity: Secondary | ICD-10-CM | POA: Diagnosis not present

## 2017-10-10 DIAGNOSIS — Z888 Allergy status to other drugs, medicaments and biological substances status: Secondary | ICD-10-CM | POA: Diagnosis not present

## 2017-10-10 DIAGNOSIS — K7460 Unspecified cirrhosis of liver: Secondary | ICD-10-CM | POA: Diagnosis not present

## 2017-10-10 DIAGNOSIS — K219 Gastro-esophageal reflux disease without esophagitis: Secondary | ICD-10-CM | POA: Diagnosis not present

## 2017-10-10 DIAGNOSIS — I5032 Chronic diastolic (congestive) heart failure: Secondary | ICD-10-CM | POA: Diagnosis not present

## 2017-10-10 DIAGNOSIS — N400 Enlarged prostate without lower urinary tract symptoms: Secondary | ICD-10-CM | POA: Diagnosis not present

## 2017-10-10 DIAGNOSIS — C220 Liver cell carcinoma: Secondary | ICD-10-CM | POA: Diagnosis not present

## 2017-10-10 DIAGNOSIS — E1140 Type 2 diabetes mellitus with diabetic neuropathy, unspecified: Secondary | ICD-10-CM | POA: Diagnosis not present

## 2017-10-10 DIAGNOSIS — K766 Portal hypertension: Secondary | ICD-10-CM | POA: Diagnosis not present

## 2017-10-10 DIAGNOSIS — K7581 Nonalcoholic steatohepatitis (NASH): Secondary | ICD-10-CM | POA: Diagnosis not present

## 2017-10-11 DIAGNOSIS — R188 Other ascites: Secondary | ICD-10-CM | POA: Diagnosis not present

## 2017-10-11 DIAGNOSIS — Z87891 Personal history of nicotine dependence: Secondary | ICD-10-CM | POA: Diagnosis not present

## 2017-10-11 DIAGNOSIS — Z950 Presence of cardiac pacemaker: Secondary | ICD-10-CM | POA: Diagnosis not present

## 2017-10-11 DIAGNOSIS — K219 Gastro-esophageal reflux disease without esophagitis: Secondary | ICD-10-CM | POA: Diagnosis not present

## 2017-10-11 DIAGNOSIS — Z885 Allergy status to narcotic agent status: Secondary | ICD-10-CM | POA: Diagnosis not present

## 2017-10-11 DIAGNOSIS — Z794 Long term (current) use of insulin: Secondary | ICD-10-CM | POA: Diagnosis not present

## 2017-10-11 DIAGNOSIS — K766 Portal hypertension: Secondary | ICD-10-CM | POA: Diagnosis not present

## 2017-10-11 DIAGNOSIS — Z888 Allergy status to other drugs, medicaments and biological substances status: Secondary | ICD-10-CM | POA: Diagnosis not present

## 2017-10-11 DIAGNOSIS — N400 Enlarged prostate without lower urinary tract symptoms: Secondary | ICD-10-CM | POA: Diagnosis not present

## 2017-10-11 DIAGNOSIS — R112 Nausea with vomiting, unspecified: Secondary | ICD-10-CM | POA: Diagnosis not present

## 2017-10-11 DIAGNOSIS — R1010 Upper abdominal pain, unspecified: Secondary | ICD-10-CM | POA: Diagnosis not present

## 2017-10-11 DIAGNOSIS — I8390 Asymptomatic varicose veins of unspecified lower extremity: Secondary | ICD-10-CM | POA: Diagnosis not present

## 2017-10-11 DIAGNOSIS — C220 Liver cell carcinoma: Secondary | ICD-10-CM | POA: Diagnosis not present

## 2017-10-11 DIAGNOSIS — E1140 Type 2 diabetes mellitus with diabetic neuropathy, unspecified: Secondary | ICD-10-CM | POA: Diagnosis not present

## 2017-10-11 DIAGNOSIS — E118 Type 2 diabetes mellitus with unspecified complications: Secondary | ICD-10-CM | POA: Diagnosis not present

## 2017-10-11 DIAGNOSIS — K7581 Nonalcoholic steatohepatitis (NASH): Secondary | ICD-10-CM | POA: Diagnosis not present

## 2017-10-11 DIAGNOSIS — G8929 Other chronic pain: Secondary | ICD-10-CM | POA: Diagnosis not present

## 2017-10-11 DIAGNOSIS — K7460 Unspecified cirrhosis of liver: Secondary | ICD-10-CM | POA: Diagnosis not present

## 2017-10-11 DIAGNOSIS — I5032 Chronic diastolic (congestive) heart failure: Secondary | ICD-10-CM | POA: Diagnosis not present

## 2017-10-12 DIAGNOSIS — C220 Liver cell carcinoma: Secondary | ICD-10-CM | POA: Diagnosis not present

## 2017-10-12 DIAGNOSIS — Z885 Allergy status to narcotic agent status: Secondary | ICD-10-CM | POA: Diagnosis not present

## 2017-10-12 DIAGNOSIS — N400 Enlarged prostate without lower urinary tract symptoms: Secondary | ICD-10-CM | POA: Diagnosis not present

## 2017-10-12 DIAGNOSIS — R1010 Upper abdominal pain, unspecified: Secondary | ICD-10-CM | POA: Diagnosis not present

## 2017-10-12 DIAGNOSIS — Z950 Presence of cardiac pacemaker: Secondary | ICD-10-CM | POA: Diagnosis not present

## 2017-10-12 DIAGNOSIS — K766 Portal hypertension: Secondary | ICD-10-CM | POA: Diagnosis not present

## 2017-10-12 DIAGNOSIS — K7460 Unspecified cirrhosis of liver: Secondary | ICD-10-CM | POA: Diagnosis not present

## 2017-10-12 DIAGNOSIS — E118 Type 2 diabetes mellitus with unspecified complications: Secondary | ICD-10-CM | POA: Diagnosis not present

## 2017-10-12 DIAGNOSIS — I5032 Chronic diastolic (congestive) heart failure: Secondary | ICD-10-CM | POA: Diagnosis not present

## 2017-10-12 DIAGNOSIS — R112 Nausea with vomiting, unspecified: Secondary | ICD-10-CM | POA: Diagnosis not present

## 2017-10-12 DIAGNOSIS — Z87891 Personal history of nicotine dependence: Secondary | ICD-10-CM | POA: Diagnosis not present

## 2017-10-12 DIAGNOSIS — I8390 Asymptomatic varicose veins of unspecified lower extremity: Secondary | ICD-10-CM | POA: Diagnosis not present

## 2017-10-12 DIAGNOSIS — R188 Other ascites: Secondary | ICD-10-CM | POA: Diagnosis not present

## 2017-10-12 DIAGNOSIS — Z888 Allergy status to other drugs, medicaments and biological substances status: Secondary | ICD-10-CM | POA: Diagnosis not present

## 2017-10-12 DIAGNOSIS — K7581 Nonalcoholic steatohepatitis (NASH): Secondary | ICD-10-CM | POA: Diagnosis not present

## 2017-10-12 DIAGNOSIS — Z794 Long term (current) use of insulin: Secondary | ICD-10-CM | POA: Diagnosis not present

## 2017-10-12 DIAGNOSIS — K219 Gastro-esophageal reflux disease without esophagitis: Secondary | ICD-10-CM | POA: Diagnosis not present

## 2017-10-12 DIAGNOSIS — E1140 Type 2 diabetes mellitus with diabetic neuropathy, unspecified: Secondary | ICD-10-CM | POA: Diagnosis not present

## 2017-10-12 DIAGNOSIS — G8929 Other chronic pain: Secondary | ICD-10-CM | POA: Diagnosis not present

## 2017-10-14 ENCOUNTER — Ambulatory Visit: Payer: Medicare Other | Admitting: Physical Therapy

## 2017-10-16 ENCOUNTER — Ambulatory Visit: Payer: Medicare Other | Admitting: Physical Therapy

## 2017-10-28 ENCOUNTER — Encounter: Payer: Self-pay | Admitting: Physical Therapy

## 2017-10-28 ENCOUNTER — Ambulatory Visit: Payer: Medicare Other | Attending: Internal Medicine | Admitting: Physical Therapy

## 2017-10-28 DIAGNOSIS — G8929 Other chronic pain: Secondary | ICD-10-CM | POA: Diagnosis present

## 2017-10-28 DIAGNOSIS — M6281 Muscle weakness (generalized): Secondary | ICD-10-CM | POA: Diagnosis present

## 2017-10-28 DIAGNOSIS — R296 Repeated falls: Secondary | ICD-10-CM | POA: Diagnosis present

## 2017-10-28 DIAGNOSIS — R262 Difficulty in walking, not elsewhere classified: Secondary | ICD-10-CM | POA: Diagnosis present

## 2017-10-28 DIAGNOSIS — M545 Low back pain: Secondary | ICD-10-CM | POA: Insufficient documentation

## 2017-10-28 NOTE — Therapy (Signed)
[place] [place], [place], [address] Phone: [phone]   Fax:  [phone]  Physical Therapy Treatment  Patient Details  Name: Keith [name] MRN: [id number] Date of Birth: March 19, 1941 Referring Provider: Osborne Casco   Encounter Date: 10/28/2017  PT End of Session - 10/28/17 1450    Visit Number  21    Date for PT Re-Evaluation  10/31/17    PT Start Time  1400    PT Stop Time  1445    PT Time Calculation (min)  45 min    Activity Tolerance  Patient limited by lethargy    Behavior During Therapy  [facility] for tasks assessed/performed       Past Medical History:  Diagnosis Date  . Cirrhosis of liver ([place])   . Diabetes mellitus   . Hepatic cancer ([place])   . Hypertension   . Prostate hypertrophy   . Stroke ([place])   . Varicose veins     Past Surgical History:  Procedure Laterality Date  . APPENDECTOMY    . EYE SURGERY     bilateral cataract   . left arm surgery     age 31  . left hand surgery     tendons ripped on left hand  . LIVER RESECTION    . TONSILLECTOMY    . TOTAL KNEE ARTHROPLASTY Left 12/14/2012   Procedure: LEFT TOTAL KNEE ARTHROPLASTY;  Surgeon: Gearlean Alf, MD;  [facility];  Service: Orthopedics;  Laterality: Left;    There were no vitals filed for this visit.  Subjective Assessment - 10/28/17 1411    Subjective  Patient has not been seen in almost a month, he reports a low back ablation, a liver ablation and difficulty with blood pressure, he reports that he was at the MD office and when he stood up the BP dropped to 70/50    Currently in Pain?  Yes    Pain Score  3     Pain Location  Leg    Aggravating Factors   walking    Pain Relieving Factors  reports that thre recent ablations may have helped                       [facility] Adult PT Treatment/Exercise - 10/28/17 0001      Ambulation/Gait   Gait Comments  no device HHA, 200 feet x 2, on the way out  he had to stop and rest 2 x without sitting      Lumbar Exercises: Aerobic   UBE (Upper Arm Bike)  level 5 x 2 minutes    Nustep  level 4 x 8 minutes      Lumbar Exercises: Machines for Strengthening   Other Lumbar Machine Exercise  red tband ankle DF/PF      Lumbar Exercises: Seated   Other Seated Lumbar Exercises  ball in lap isometric abs      Knee/Hip Exercises: Seated   Hamstring Curl  Both;2 sets;15 reps    Hamstring Limitations  green tband       Manual Therapy   Manual Therapy  Soft tissue mobilization    Manual therapy comments  STM of the quads and ITB and into the calves               PT Short Term Goals - 07/09/17 1150      PT SHORT TERM GOAL #1   Title  patient to be  independent with initial HEP     Status  Achieved        PT Long Term Goals - 10/28/17 1504      PT LONG TERM GOAL #1   Title  Patient to improve TUG score to 27 seconds     Status  Partially Met      PT LONG TERM GOAL #2   Title  Patient to report ability to walk 350 feet without rest or increase in low back pain     Status  Partially Met      PT LONG TERM GOAL #4   Title  be independent with gym exercises    Status  Partially Met            Plan - 10/28/17 1450    Clinical Impression Statement  Patient reports complications from an ablation on the liver and an ablation on the back in the past 3 weeks, he also reports that he has had issues with low blood pressure when he stands, he does reports also some changes in medication.  HE reports taht he has had less back pain since the ablation, he also reports that he feels he is walking better and I agree, but he did need multiple rest breaks to get to his car    PT Next Visit Plan  renewal, speak with wife, as he has had a lot of complications    Consulted and Agree with Plan of Care  Patient       Patient will benefit from skilled therapeutic intervention in order to improve the following deficits and impairments:  Abnormal  gait, Difficulty walking, Cardiopulmonary status limiting activity, Decreased endurance, Dizziness, Increased muscle spasms, Decreased activity tolerance, Pain, Decreased balance, Impaired flexibility, Decreased strength, Decreased mobility  Visit Diagnosis: Muscle weakness (generalized)  Difficulty in walking, not elsewhere classified  Chronic low back pain, unspecified back pain laterality, with sciatica presence unspecified  Repeated falls     Problem List Patient Active Problem List   Diagnosis Date Noted  . Varicose veins of left lower extremity with complications 01/74/9449  . Knee pain, acute 01/26/2013  . Constipation 12/22/2012  . Neuropathy 12/22/2012  . Prostate hypertrophy 12/22/2012  . Acute blood loss anemia 12/22/2012  . Hyperlipidemia 12/22/2012  . Diabetes ([place]) 12/17/2012  . GERD (gastroesophageal reflux disease) 12/17/2012  . Hypertension 12/17/2012  . Thrombocytopenia, unspecified ([place]) 12/17/2012  . Postoperative anemia due to acute blood loss 12/15/2012  . Hyponatremia 12/15/2012  . OA (osteoarthritis) of knee 12/14/2012    [name], PT 10/28/2017, 3:05 PM  [address] [place], [place], [address] Phone: [phone]   Fax:  [phone]  Name: Keith [name] MRN: [id number] Date of Birth: 1940/10/13

## 2017-11-04 ENCOUNTER — Ambulatory Visit: Payer: Medicare Other | Admitting: Physical Therapy

## 2017-11-04 ENCOUNTER — Encounter: Payer: Self-pay | Admitting: Physical Therapy

## 2017-11-04 DIAGNOSIS — R262 Difficulty in walking, not elsewhere classified: Secondary | ICD-10-CM

## 2017-11-04 DIAGNOSIS — M6281 Muscle weakness (generalized): Secondary | ICD-10-CM

## 2017-11-04 DIAGNOSIS — G8929 Other chronic pain: Secondary | ICD-10-CM

## 2017-11-04 DIAGNOSIS — R296 Repeated falls: Secondary | ICD-10-CM

## 2017-11-04 DIAGNOSIS — M545 Low back pain: Secondary | ICD-10-CM

## 2017-11-04 NOTE — Therapy (Signed)
[place] [address] [place], [place], [address] Phone: [phone]   Fax:  [phone]  Physical Therapy Treatment  Patient Details  Name: Keith [name] MRN: [id number] Date of Birth: Oct 28, 1940 Referring Provider: Osborne Casco   Encounter Date: 11/04/2017  PT End of Session - 11/04/17 1643    Visit Number  22    Date for PT Re-Evaluation  12/05/17    PT Start Time  1450    PT Stop Time  1530    PT Time Calculation (min)  40 min    Activity Tolerance  Patient limited by lethargy    Behavior During Therapy  [facility] for tasks assessed/performed       Past Medical History:  Diagnosis Date  . Cirrhosis of liver ([place])   . Diabetes mellitus   . Hepatic cancer ([place])   . Hypertension   . Prostate hypertrophy   . Stroke ([place])   . Varicose veins     Past Surgical History:  Procedure Laterality Date  . APPENDECTOMY    . EYE SURGERY     bilateral cataract   . left arm surgery     age 21  . left hand surgery     tendons ripped on left hand  . LIVER RESECTION    . TONSILLECTOMY    . TOTAL KNEE ARTHROPLASTY Left 12/14/2012   Procedure: LEFT TOTAL KNEE ARTHROPLASTY;  Surgeon: Gearlean Alf, MD;  [facility]: [facility];  Service: Orthopedics;  Laterality: Left;    There were no vitals filed for this visit.  Subjective Assessment - 11/04/17 1545    Subjective  Patient reports a fall last week, his wife who is a nurse reports that when he stands his blood pressure drops from 120/80 to 80/50.  He reports that he woke up in the floor.  His wife does report that this is the first time he has been able to get off of the floor with just her help, she reports that he seemed to be stronger    Currently in Pain?  Yes    Pain Score  2     Pain Location  Leg    Aggravating Factors   standing, walking                       [facility] Adult PT Treatment/Exercise - 11/04/17 0001      Ambulation/Gait   Gait Comments   no device, HHA 2 x 70 feet, reports feeling very light headed.      Lumbar Exercises: Aerobic   Nustep  level 4 x 6 minutes      Lumbar Exercises: Machines for Strengthening   Other Lumbar Machine Exercise  red tband ankle DF/PF      Lumbar Exercises: Seated   Other Seated Lumbar Exercises  ball in lap isometric abs      Knee/Hip Exercises: Seated   Long Arc Quad  2 sets;10 reps;Weights    Hamstring Curl  Both;2 sets;15 reps    Hamstring Limitations  green tband       Manual Therapy   Manual Therapy  Soft tissue mobilization    Manual therapy comments  STM of the quads and ITB and into the calves               PT Short Term Goals - 07/09/17 1150      PT SHORT TERM GOAL #1   Title  patient to be  independent with initial HEP     Status  Achieved        PT Long Term Goals - 11/04/17 1647      PT LONG TERM GOAL #1   Title  Patient to improve TUG score to 27 seconds     Status  Partially Met      PT LONG TERM GOAL #2   Title  Patient to report ability to walk 350 feet without rest or increase in low back pain     Status  Partially Met      PT LONG TERM GOAL #3   Title  increase LE strength to 4+/5    Status  Achieved      PT LONG TERM GOAL #4   Title  be independent with gym exercises    Status  Partially Met            Plan - 11/04/17 1645    Clinical Impression Statement  Patient continuing to have complications from the orthostatic hypotension.  He is a little lethargic, reports that he is trying to do the stuff I talked about last time and this time where he gets up and stays there before moving to let BP regulate.  He and his wife both report that sometime it is late in happening and he could already be walking, he continues to have veyr tight mms throughout and c/o pain and stiffness    PT Frequency  1x / week    PT Duration  4 weeks    PT Treatment/Interventions  ADLs/Self Care Home Management;Moist Heat;Functional mobility training;Therapeutic  activities;Therapeutic exercise;Balance training;Neuromuscular re-education;Manual techniques;Patient/family education    PT Next Visit Plan  work over the next period on his functional mobility    Consulted and Agree with Plan of Care  Patient       Patient will benefit from skilled therapeutic intervention in order to improve the following deficits and impairments:  Abnormal gait, Difficulty walking, Cardiopulmonary status limiting activity, Decreased endurance, Dizziness, Increased muscle spasms, Decreased activity tolerance, Pain, Decreased balance, Impaired flexibility, Decreased strength, Decreased mobility  Visit Diagnosis: Muscle weakness (generalized) - Plan: PT plan of care cert/re-cert  Difficulty in walking, not elsewhere classified - Plan: PT plan of care cert/re-cert  Chronic low back pain, unspecified back pain laterality, with sciatica presence unspecified - Plan: PT plan of care cert/re-cert  Repeated falls - Plan: PT plan of care cert/re-cert     Problem List Patient Active Problem List   Diagnosis Date Noted  . Varicose veins of left lower extremity with complications 16/10/3708  . Knee pain, acute 01/26/2013  . Constipation 12/22/2012  . Neuropathy 12/22/2012  . Prostate hypertrophy 12/22/2012  . Acute blood loss anemia 12/22/2012  . Hyperlipidemia 12/22/2012  . Diabetes ([place]) 12/17/2012  . GERD (gastroesophageal reflux disease) 12/17/2012  . Hypertension 12/17/2012  . Thrombocytopenia, unspecified ([place]) 12/17/2012  . Postoperative anemia due to acute blood loss 12/15/2012  . Hyponatremia 12/15/2012  . OA (osteoarthritis) of knee 12/14/2012    [name], PT 11/04/2017, 4:49 PM  [place] [address] [place], [place], [address] Phone: [phone]   Fax:  [phone]  Name: Keith [name] MRN: [id number] Date of Birth: 1940-08-18

## 2017-11-05 DIAGNOSIS — E113553 Type 2 diabetes mellitus with stable proliferative diabetic retinopathy, bilateral: Secondary | ICD-10-CM | POA: Diagnosis not present

## 2017-11-05 DIAGNOSIS — H4720 Unspecified optic atrophy: Secondary | ICD-10-CM | POA: Diagnosis not present

## 2017-11-05 DIAGNOSIS — H353131 Nonexudative age-related macular degeneration, bilateral, early dry stage: Secondary | ICD-10-CM | POA: Diagnosis not present

## 2017-11-07 ENCOUNTER — Ambulatory Visit: Payer: Medicare Other | Attending: Internal Medicine | Admitting: Physical Therapy

## 2017-11-07 ENCOUNTER — Encounter: Payer: Self-pay | Admitting: Physical Therapy

## 2017-11-07 DIAGNOSIS — M6281 Muscle weakness (generalized): Secondary | ICD-10-CM | POA: Diagnosis not present

## 2017-11-07 DIAGNOSIS — R262 Difficulty in walking, not elsewhere classified: Secondary | ICD-10-CM | POA: Insufficient documentation

## 2017-11-07 DIAGNOSIS — M545 Low back pain: Secondary | ICD-10-CM | POA: Diagnosis not present

## 2017-11-07 DIAGNOSIS — R296 Repeated falls: Secondary | ICD-10-CM | POA: Insufficient documentation

## 2017-11-07 DIAGNOSIS — G8929 Other chronic pain: Secondary | ICD-10-CM | POA: Diagnosis not present

## 2017-11-07 NOTE — Therapy (Signed)
[place], [place], [address] Phone: [phone]   Fax:  [phone]  Physical Therapy Treatment  Patient Details  Name: Keith [name] MRN: [id number] Date of Birth: 06/22/1940 Referring Provider: Osborne Casco   Encounter Date: 11/07/2017  PT End of Session - 11/07/17 1139    Visit Number  23    Date for PT Re-Evaluation  12/05/17    PT Start Time  1100    PT Stop Time  1147    PT Time Calculation (min)  47 min    Activity Tolerance  Patient limited by lethargy;Patient limited by pain    Behavior During Therapy  [facility] for tasks assessed/performed       Past Medical History:  Diagnosis Date  . Cirrhosis of liver ([place])   . Diabetes mellitus   . Hepatic cancer ([place])   . Hypertension   . Prostate hypertrophy   . Stroke ([place])   . Varicose veins     Past Surgical History:  Procedure Laterality Date  . APPENDECTOMY    . EYE SURGERY     bilateral cataract   . left arm surgery     age 18  . left hand surgery     tendons ripped on left hand  . LIVER RESECTION    . TONSILLECTOMY    . TOTAL KNEE ARTHROPLASTY Left 12/14/2012   Procedure: LEFT TOTAL KNEE ARTHROPLASTY;  Surgeon: Gearlean Alf, MD;  [facility];  Service: Orthopedics;  Laterality: Left;    There were no vitals filed for this visit.  Subjective Assessment - 11/07/17 1137    Subjective  Patient reports a fall on Tuesday, reports again the blood pressure dropped.  He has soreness and bruising on the right elbow and the left knee    Currently in Pain?  Yes    Pain Score  5     Pain Location  Leg    Pain Orientation  Right;Left                       [facility] Adult PT Treatment/Exercise - 11/07/17 0001      Lumbar Exercises: Seated   Other Seated Lumbar Exercises  ball in lap isometric abs, isometric arms and legs working on this and educating him on doing prior to getting up from sitting, to get blood  pressure up a little and into the mms and not having the significant drop      Manual Therapy   Manual Therapy  Soft tissue mobilization    Manual therapy comments  STM of the quads and ITB and into the calves               PT Short Term Goals - 07/09/17 1150      PT SHORT TERM GOAL #1   Title  patient to be independent with initial HEP     Status  Achieved        PT Long Term Goals - 11/04/17 1647      PT LONG TERM GOAL #1   Title  Patient to improve TUG score to 27 seconds     Status  Partially Met      PT LONG TERM GOAL #2   Title  Patient to report ability to walk 350 feet without rest or increase in low back pain     Status  Partially Met      PT LONG TERM GOAL #  3   Title  increase LE strength to 4+/5    Status  Achieved      PT LONG TERM GOAL #4   Title  be independent with gym exercises    Status  Partially Met            Plan - 11/07/17 1139    Clinical Impression Statement  Patient with issues of orthostatic hypotension.  I instructed him to assure hydration, and then gave him the compensatory techniques to stand and wait before moving and then also to do some isometric activities to get blood in the mms and not have the fast drop of BP.  He reported understanding and his wife is a Marine scientist has been telling him to drink more water.    PT Next Visit Plan  work over the next period on his functional mobility    Consulted and Agree with Plan of Care  Patient       Patient will benefit from skilled therapeutic intervention in order to improve the following deficits and impairments:  Abnormal gait, Difficulty walking, Cardiopulmonary status limiting activity, Decreased endurance, Dizziness, Increased muscle spasms, Decreased activity tolerance, Pain, Decreased balance, Impaired flexibility, Decreased strength, Decreased mobility  Visit Diagnosis: Muscle weakness (generalized)  Difficulty in walking, not elsewhere classified  Chronic low back pain,  unspecified back pain laterality, with sciatica presence unspecified  Repeated falls     Problem List Patient Active Problem List   Diagnosis Date Noted  . Varicose veins of left lower extremity with complications 79/06/8331  . Knee pain, acute 01/26/2013  . Constipation 12/22/2012  . Neuropathy 12/22/2012  . Prostate hypertrophy 12/22/2012  . Acute blood loss anemia 12/22/2012  . Hyperlipidemia 12/22/2012  . Diabetes ([place]) 12/17/2012  . GERD (gastroesophageal reflux disease) 12/17/2012  . Hypertension 12/17/2012  . Thrombocytopenia, unspecified ([place]) 12/17/2012  . Postoperative anemia due to acute blood loss 12/15/2012  . Hyponatremia 12/15/2012  . OA (osteoarthritis) of knee 12/14/2012    [name], PT 11/07/2017, 11:54 AM  [place] [address] [place], [place], [address] Phone: [phone]   Fax:  [phone]  Name: Keith [name] MRN: [id number] Date of Birth: [birth year]

## 2017-11-11 ENCOUNTER — Ambulatory Visit: Payer: Medicare Other | Admitting: Physical Therapy

## 2017-11-11 ENCOUNTER — Encounter: Payer: Self-pay | Admitting: Physical Therapy

## 2017-11-11 DIAGNOSIS — G8929 Other chronic pain: Secondary | ICD-10-CM

## 2017-11-11 DIAGNOSIS — M6281 Muscle weakness (generalized): Secondary | ICD-10-CM

## 2017-11-11 DIAGNOSIS — R262 Difficulty in walking, not elsewhere classified: Secondary | ICD-10-CM

## 2017-11-11 DIAGNOSIS — M545 Low back pain: Secondary | ICD-10-CM

## 2017-11-11 DIAGNOSIS — R296 Repeated falls: Secondary | ICD-10-CM

## 2017-11-11 NOTE — Therapy (Signed)
[place], [place], [address] Phone: [phone]   Fax:  [phone]  Physical Therapy Treatment  Patient Details  Name: Keith [name] MRN: [id number] Date of Birth: 1940-11-11 Referring Provider: Osborne Casco   Encounter Date: 11/11/2017  PT End of Session - 11/11/17 1305    Visit Number  24    Date for PT Re-Evaluation  12/05/17    PT Start Time  1100    PT Stop Time  1145    PT Time Calculation (min)  45 min    Activity Tolerance  Patient limited by lethargy;Patient limited by pain    Behavior During Therapy  [facility] for tasks assessed/performed       Past Medical History:  Diagnosis Date  . Cirrhosis of liver ([place])   . Diabetes mellitus   . Hepatic cancer ([place])   . Hypertension   . Prostate hypertrophy   . Stroke ([place])   . Varicose veins     Past Surgical History:  Procedure Laterality Date  . APPENDECTOMY    . EYE SURGERY     bilateral cataract   . left arm surgery     age 6  . left hand surgery     tendons ripped on left hand  . LIVER RESECTION    . TONSILLECTOMY    . TOTAL KNEE ARTHROPLASTY Left 12/14/2012   Procedure: LEFT TOTAL KNEE ARTHROPLASTY;  Surgeon: Gearlean Alf, MD;  [facility];  Service: Orthopedics;  Laterality: Left;    There were no vitals filed for this visit.  Subjective Assessment - 11/11/17 1302    Subjective  Patient reports that he is still battling the blood pressure drops.  He is having good days and bad days    Currently in Pain?  Yes    Pain Score  4     Pain Location  Leg    Pain Orientation  Right;Left                       [facility] Adult PT Treatment/Exercise - 11/11/17 0001      Ambulation/Gait   Gait Comments  HHA x 140 feet moving better today, less strong of a handhold and only one time did he stop to rest      Lumbar Exercises: Aerobic   Nustep  level 4 x 6 minutes      Lumbar Exercises: Machines for  Strengthening   Other Lumbar Machine Exercise  red tband ankle DF/PF      Lumbar Exercises: Seated   Long Arc Quad on Chair  Strengthening;Both;2 sets;10 reps;Weights    LAQ on Chair Weights (lbs)  3#    Other Seated Lumbar Exercises  marches 3# bilateral    Other Seated Lumbar Exercises  ball in lap isometric abs, isometric arms and legs working on this and educating him on doing prior to getting up from sitting, to get blood pressure up a little and into the mms and not having the significant drop, green tband scapular stabilization 2 ways. ball b/n knees squeeze, seated hip abduction green tband all 2x10      Manual Therapy   Manual Therapy  Soft tissue mobilization    Manual therapy comments  STM of the quads and ITB and into the calves               PT Short Term Goals - 07/09/17 1150  PT SHORT TERM GOAL #1   Title  patient to be independent with initial HEP     Status  Achieved        PT Long Term Goals - 11/04/17 1647      PT LONG TERM GOAL #1   Title  Patient to improve TUG score to 27 seconds     Status  Partially Met      PT LONG TERM GOAL #2   Title  Patient to report ability to walk 350 feet without rest or increase in low back pain     Status  Partially Met      PT LONG TERM GOAL #3   Title  increase LE strength to 4+/5    Status  Achieved      PT LONG TERM GOAL #4   Title  be independent with gym exercises    Status  Partially Met            Plan - 11/11/17 1307    Clinical Impression Statement  Walked much better today, less pressure with the HHA, less stopping due to fatigue and dizziness.  Able to resume some exercises without difficulty, did not have the dizziness today that he has been having    PT Next Visit Plan  work over the next period on his functional mobility    Consulted and Agree with Plan of Care  Patient       Patient will benefit from skilled therapeutic intervention in order to improve the following deficits and  impairments:  Abnormal gait, Difficulty walking, Cardiopulmonary status limiting activity, Decreased endurance, Dizziness, Increased muscle spasms, Decreased activity tolerance, Pain, Decreased balance, Impaired flexibility, Decreased strength, Decreased mobility  Visit Diagnosis: Muscle weakness (generalized)  Difficulty in walking, not elsewhere classified  Chronic low back pain, unspecified back pain laterality, with sciatica presence unspecified  Repeated falls     Problem List Patient Active Problem List   Diagnosis Date Noted  . Varicose veins of left lower extremity with complications 03/04/2016  . Knee pain, acute 01/26/2013  . Constipation 12/22/2012  . Neuropathy 12/22/2012  . Prostate hypertrophy 12/22/2012  . Acute blood loss anemia 12/22/2012  . Hyperlipidemia 12/22/2012  . Diabetes ([facility]) 12/17/2012  . GERD (gastroesophageal reflux disease) 12/17/2012  . Hypertension 12/17/2012  . Thrombocytopenia, unspecified ([facility]) 12/17/2012  . Postoperative anemia due to acute blood loss 12/15/2012  . Hyponatremia 12/15/2012  . OA (osteoarthritis) of knee 12/14/2012    , W., PT 11/11/2017, 1:08 PM  [facility] [address] [place], [place], [address] Phone: [phone]   Fax:  [phone]  Name: Keith [name] MRN: [id number] Date of Birth: 07/17/1940   

## 2017-11-13 ENCOUNTER — Ambulatory Visit: Payer: Medicare Other | Admitting: Physical Therapy

## 2017-11-13 ENCOUNTER — Encounter: Payer: Self-pay | Admitting: Physical Therapy

## 2017-11-13 DIAGNOSIS — M6281 Muscle weakness (generalized): Secondary | ICD-10-CM | POA: Diagnosis not present

## 2017-11-13 DIAGNOSIS — R262 Difficulty in walking, not elsewhere classified: Secondary | ICD-10-CM

## 2017-11-13 DIAGNOSIS — M545 Low back pain: Secondary | ICD-10-CM

## 2017-11-13 DIAGNOSIS — G8929 Other chronic pain: Secondary | ICD-10-CM

## 2017-11-13 NOTE — Therapy (Signed)
[place], [place], [address] Phone: [phone]   Fax:  [phone]  Physical Therapy Treatment  Patient Details  Name: Keith [name] MRN: [id number] Date of Birth: [birth year] Referring Provider: Osborne Casco   Encounter Date: 11/13/2017  PT End of Session - 11/13/17 1252    Visit Number  25    Date for PT Re-Evaluation  12/05/17    PT Start Time  1100    PT Stop Time  1150    PT Time Calculation (min)  50 min    Activity Tolerance  Patient limited by lethargy;Patient limited by pain    Behavior During Therapy  [facility] for tasks assessed/performed       Past Medical History:  Diagnosis Date  . Cirrhosis of liver ([place])   . Diabetes mellitus   . Hepatic cancer ([place])   . Hypertension   . Prostate hypertrophy   . Stroke ([place])   . Varicose veins     Past Surgical History:  Procedure Laterality Date  . APPENDECTOMY    . EYE SURGERY     bilateral cataract   . left arm surgery     age 33  . left hand surgery     tendons ripped on left hand  . LIVER RESECTION    . TONSILLECTOMY    . TOTAL KNEE ARTHROPLASTY Left 12/14/2012   Procedure: LEFT TOTAL KNEE ARTHROPLASTY;  Surgeon: Gearlean Alf, MD;  [facility];  Service: Orthopedics;  Laterality: Left;    There were no vitals filed for this visit.  Subjective Assessment - 11/13/17 1155    Subjective  Patient reports another fall on Tuesday.  He reports he went outside and sat on the porch, when he got up he has to step over the door threshold, reports that he got over okay but legs gave out and he went down, wife thinks it was too hot to be out as long as he was, he thinks his legs just give out    Currently in Pain?  Yes    Pain Score  5     Pain Location  Rib cage    Pain Orientation  Right    Aggravating Factors   reports some tight rib area pain after the fall on Tuesday                       [facility] Adult PT  Treatment/Exercise - 11/13/17 0001      Ambulation/Gait   Gait Comments  Worked on him stepping over objects and gave a lot of verbal and visual cues to simulate coming in from the porch, he gave a lot of input on why he could or could not do the activity that I simulated, he reports that he has to go in sideways and hold the door frame., tried stepping over half pool noodle sideways      Lumbar Exercises: Seated   Other Seated Lumbar Exercises  marches with no weight    Other Seated Lumbar Exercises  ball in lap isometric abs, isometric arms and legs working on this and educating him on doing prior to getting up from sitting, to get blood pressure up a little and into the mms and not having the significant drop, green tband scapular stabilization 2 ways. ball b/n knees squeeze, seated hip abduction green tband all 2x10      Manual Therapy   Manual Therapy  Soft  tissue mobilization    Manual therapy comments  STM of the quads and ITB and into the calves               PT Short Term Goals - 07/09/17 1150      PT SHORT TERM GOAL #1   Title  patient to be independent with initial HEP     Status  Achieved        PT Long Term Goals - 11/13/17 1254      PT LONG TERM GOAL #2   Title  Patient to report ability to walk 350 feet without rest or increase in low back pain     Status  On-going            Plan - 11/13/17 1253    Clinical Impression Statement  Patient had to be wheeled into the clinic today due to him feeling very weak, reported "almost did not make it to the car".  Reported another fall coming ino the house form his porch, he reports that he has to step over a threshold and this is difficulty, worked with him today on this, needed a lot of cues nad assist    PT Next Visit Plan  work over the next period on his functional mobility    Consulted and Agree with Plan of Care  Patient       Patient will benefit from skilled therapeutic intervention in order to improve  the following deficits and impairments:  Abnormal gait, Difficulty walking, Cardiopulmonary status limiting activity, Decreased endurance, Dizziness, Increased muscle spasms, Decreased activity tolerance, Pain, Decreased balance, Impaired flexibility, Decreased strength, Decreased mobility  Visit Diagnosis: Muscle weakness (generalized)  Difficulty in walking, not elsewhere classified  Chronic low back pain, unspecified back pain laterality, with sciatica presence unspecified     Problem List Patient Active Problem List   Diagnosis Date Noted  . Varicose veins of left lower extremity with complications 03/40/3524  . Knee pain, acute 01/26/2013  . Constipation 12/22/2012  . Neuropathy 12/22/2012  . Prostate hypertrophy 12/22/2012  . Acute blood loss anemia 12/22/2012  . Hyperlipidemia 12/22/2012  . Diabetes ([place]) 12/17/2012  . GERD (gastroesophageal reflux disease) 12/17/2012  . Hypertension 12/17/2012  . Thrombocytopenia, unspecified ([place]) 12/17/2012  . Postoperative anemia due to acute blood loss 12/15/2012  . Hyponatremia 12/15/2012  . OA (osteoarthritis) of knee 12/14/2012    [name], PT 11/13/2017, 12:55 PM  [place], [place], [address] Phone: [phone]   Fax:  [phone]  Name: Keith [name] MRN: [id number] Date of Birth: 07/20/1940

## 2017-11-23 ENCOUNTER — Other Ambulatory Visit: Payer: Self-pay

## 2017-11-23 ENCOUNTER — Observation Stay (HOSPITAL_COMMUNITY)
Admission: EM | Admit: 2017-11-23 | Discharge: 2017-11-27 | Disposition: A | Discharge status: Home / Self Care | Payer: Medicare Other | Attending: Internal Medicine | Admitting: Internal Medicine

## 2017-11-23 ENCOUNTER — Emergency Department (HOSPITAL_COMMUNITY): Payer: Medicare Other

## 2017-11-23 ENCOUNTER — Encounter (HOSPITAL_COMMUNITY): Payer: Self-pay

## 2017-11-23 DIAGNOSIS — Z87891 Personal history of nicotine dependence: Secondary | ICD-10-CM | POA: Diagnosis not present

## 2017-11-23 DIAGNOSIS — E1165 Type 2 diabetes mellitus with hyperglycemia: Secondary | ICD-10-CM | POA: Diagnosis not present

## 2017-11-23 DIAGNOSIS — E669 Obesity, unspecified: Secondary | ICD-10-CM | POA: Diagnosis not present

## 2017-11-23 DIAGNOSIS — G909 Disorder of the autonomic nervous system, unspecified: Secondary | ICD-10-CM | POA: Diagnosis not present

## 2017-11-23 DIAGNOSIS — K219 Gastro-esophageal reflux disease without esophagitis: Secondary | ICD-10-CM | POA: Diagnosis present

## 2017-11-23 DIAGNOSIS — Z79899 Other long term (current) drug therapy: Secondary | ICD-10-CM | POA: Insufficient documentation

## 2017-11-23 DIAGNOSIS — G4733 Obstructive sleep apnea (adult) (pediatric): Secondary | ICD-10-CM | POA: Diagnosis not present

## 2017-11-23 DIAGNOSIS — K7460 Unspecified cirrhosis of liver: Secondary | ICD-10-CM

## 2017-11-23 DIAGNOSIS — M179 Osteoarthritis of knee, unspecified: Secondary | ICD-10-CM | POA: Diagnosis present

## 2017-11-23 DIAGNOSIS — N390 Urinary tract infection, site not specified: Secondary | ICD-10-CM | POA: Diagnosis not present

## 2017-11-23 DIAGNOSIS — Z7901 Long term (current) use of anticoagulants: Secondary | ICD-10-CM | POA: Insufficient documentation

## 2017-11-23 DIAGNOSIS — E119 Type 2 diabetes mellitus without complications: Secondary | ICD-10-CM | POA: Insufficient documentation

## 2017-11-23 DIAGNOSIS — I10 Essential (primary) hypertension: Secondary | ICD-10-CM | POA: Diagnosis not present

## 2017-11-23 DIAGNOSIS — R531 Weakness: Secondary | ICD-10-CM | POA: Diagnosis not present

## 2017-11-23 DIAGNOSIS — R270 Ataxia, unspecified: Secondary | ICD-10-CM | POA: Diagnosis not present

## 2017-11-23 DIAGNOSIS — J9811 Atelectasis: Secondary | ICD-10-CM | POA: Diagnosis not present

## 2017-11-23 DIAGNOSIS — Z794 Long term (current) use of insulin: Secondary | ICD-10-CM | POA: Diagnosis not present

## 2017-11-23 DIAGNOSIS — Z950 Presence of cardiac pacemaker: Secondary | ICD-10-CM

## 2017-11-23 DIAGNOSIS — M6281 Muscle weakness (generalized): Principal | ICD-10-CM | POA: Insufficient documentation

## 2017-11-23 DIAGNOSIS — N400 Enlarged prostate without lower urinary tract symptoms: Secondary | ICD-10-CM | POA: Diagnosis present

## 2017-11-23 DIAGNOSIS — M1710 Unilateral primary osteoarthritis, unspecified knee: Secondary | ICD-10-CM | POA: Diagnosis present

## 2017-11-23 DIAGNOSIS — E7220 Disorder of urea cycle metabolism, unspecified: Secondary | ICD-10-CM | POA: Diagnosis not present

## 2017-11-23 HISTORY — DX: Other symptoms and signs involving the musculoskeletal system: R29.898

## 2017-11-23 LAB — URINALYSIS, ROUTINE W REFLEX MICROSCOPIC
BILIRUBIN URINE: NEGATIVE
Bacteria, UA: NONE SEEN
GLUCOSE, UA: NEGATIVE mg/dL
Ketones, ur: NEGATIVE mg/dL
NITRITE: POSITIVE — AB
Protein, ur: NEGATIVE mg/dL
SPECIFIC GRAVITY, URINE: 1.009 (ref 1.005–1.030)
WBC, UA: >50 WBC/hpf — ABNORMAL HIGH (ref 0–5)
pH: 7 (ref 5.0–8.0)

## 2017-11-23 LAB — BASIC METABOLIC PANEL
Anion gap: 8 (ref 5–15)
BUN: 11 mg/dL (ref 8–23)
CHLORIDE: 105 mmol/L (ref 98–111)
CO2: 26 mmol/L (ref 22–32)
CREATININE: 1.1 mg/dL (ref 0.61–1.24)
Calcium: 8.5 mg/dL — ABNORMAL LOW (ref 8.9–10.3)
GFR calc Af Amer: >60 mL/min (ref >60)
GFR calc non Af Amer: >60 mL/min (ref >60)
Glucose, Bld: 218 mg/dL — ABNORMAL HIGH (ref 70–99)
POTASSIUM: 4.1 mmol/L (ref 3.5–5.1)
SODIUM: 139 mmol/L (ref 135–145)

## 2017-11-23 LAB — CBC
HEMATOCRIT: 37.4 % — AB (ref 39.0–52.0)
Hemoglobin: 12.5 g/dL — ABNORMAL LOW (ref 13.0–17.0)
MCH: 32.4 pg (ref 26.0–34.0)
MCHC: 33.4 g/dL (ref 30.0–36.0)
MCV: 96.9 fL (ref 78.0–100.0)
Platelets: 63 10*3/uL — ABNORMAL LOW (ref 150–400)
RBC: 3.86 MIL/uL — ABNORMAL LOW (ref 4.22–5.81)
RDW: 15.2 % (ref 11.5–15.5)
WBC: 6.4 10*3/uL (ref 4.0–10.5)

## 2017-11-23 LAB — LIPID PANEL
CHOL/HDL RATIO: 4 ratio
CHOLESTEROL: 149 mg/dL (ref 0–200)
HDL: 37 mg/dL — AB (ref >40)
LDL CALC: 96 mg/dL (ref 0–99)
TRIGLYCERIDES: 79 mg/dL (ref <150)
VLDL: 16 mg/dL (ref 0–40)

## 2017-11-23 LAB — DIFFERENTIAL
Eosinophils Absolute: 0 10*3/uL (ref 0.0–0.7)
Eosinophils Relative: 1 %
LYMPHS ABS: 0.9 10*3/uL (ref 0.7–4.0)
LYMPHS PCT: 15 %
MONOS PCT: 14 %
Monocytes Absolute: 0.9 10*3/uL (ref 0.1–1.0)
NEUTROS ABS: 4.4 10*3/uL (ref 1.7–7.7)
Neutrophils Relative %: 70 %

## 2017-11-23 LAB — TSH: TSH: 1.943 u[IU]/mL (ref 0.350–4.500)

## 2017-11-23 LAB — I-STAT TROPONIN, ED: Troponin i, poc: 0.01 ng/mL (ref 0.00–0.08)

## 2017-11-23 LAB — PROTIME-INR
INR: 1.09
Prothrombin Time: 14.1 seconds (ref 11.4–15.2)

## 2017-11-23 LAB — HEPATIC FUNCTION PANEL
ALK PHOS: 89 U/L (ref 38–126)
ALT: 22 U/L (ref 0–44)
AST: 32 U/L (ref 15–41)
Albumin: 3 g/dL — ABNORMAL LOW (ref 3.5–5.0)
BILIRUBIN DIRECT: 0.7 mg/dL — AB (ref 0.0–0.2)
BILIRUBIN TOTAL: 2.9 mg/dL — AB (ref 0.3–1.2)
Indirect Bilirubin: 2.2 mg/dL — ABNORMAL HIGH (ref 0.3–0.9)
Total Protein: 6.6 g/dL (ref 6.5–8.1)

## 2017-11-23 LAB — CBG MONITORING, ED: GLUCOSE-CAPILLARY: 192 mg/dL — AB (ref 70–99)

## 2017-11-23 LAB — AMMONIA: Ammonia: 60 umol/L — ABNORMAL HIGH (ref 9–35)

## 2017-11-23 LAB — VITAMIN B12: Vitamin B-12: 516 pg/mL (ref 180–914)

## 2017-11-23 LAB — HEMOGLOBIN A1C
HEMOGLOBIN A1C: 6.6 % — AB (ref 4.8–5.6)
Mean Plasma Glucose: 142.72 mg/dL

## 2017-11-23 LAB — LIPASE, BLOOD: Lipase: 23 U/L (ref 11–51)

## 2017-11-23 LAB — GLUCOSE, CAPILLARY: Glucose-Capillary: 127 mg/dL — ABNORMAL HIGH (ref 70–99)

## 2017-11-23 LAB — CK: Total CK: 89 U/L (ref 49–397)

## 2017-11-23 MED ORDER — ENOXAPARIN SODIUM 40 MG/0.4ML ~~LOC~~ SOLN
40.0000 mg | SUBCUTANEOUS | Status: DC
  Administered 2017-11-23: 40 mg via SUBCUTANEOUS
  Filled 2017-11-23: qty 0.4

## 2017-11-23 MED ORDER — ASPIRIN 325 MG PO TABS
325.0000 mg | ORAL_TABLET | Freq: Once | ORAL | Status: AC
  Administered 2017-11-23: 325 mg via ORAL
  Filled 2017-11-23: qty 1

## 2017-11-23 MED ORDER — MEMANTINE HCL 10 MG PO TABS
20.0000 mg | ORAL_TABLET | Freq: Every day | ORAL | Status: DC
  Administered 2017-11-23 – 2017-11-26 (×4): 20 mg via ORAL
  Filled 2017-11-23 (×4): qty 2

## 2017-11-23 MED ORDER — VITAMIN D 1000 UNITS PO TABS
1000.0000 [IU] | ORAL_TABLET | Freq: Every day | ORAL | Status: DC
  Administered 2017-11-24 – 2017-11-27 (×4): 1000 [IU] via ORAL
  Filled 2017-11-23 (×4): qty 1

## 2017-11-23 MED ORDER — SODIUM CHLORIDE 0.9 % IV SOLN
1.0000 g | Freq: Once | INTRAVENOUS | Status: AC
  Administered 2017-11-23: 1 g via INTRAVENOUS
  Filled 2017-11-23: qty 10

## 2017-11-23 MED ORDER — HYDROCODONE-ACETAMINOPHEN 5-325 MG PO TABS: 1.0000 | ORAL_TABLET | ORAL | Status: DC | PRN

## 2017-11-23 MED ORDER — TRAMADOL HCL 50 MG PO TABS
50.0000 mg | ORAL_TABLET | Freq: Two times a day (BID) | ORAL | Status: DC
  Administered 2017-11-23 – 2017-11-27 (×8): 50 mg via ORAL
  Filled 2017-11-23 (×8): qty 1

## 2017-11-23 MED ORDER — ACETAMINOPHEN 325 MG PO TABS
650.0000 mg | ORAL_TABLET | Freq: Four times a day (QID) | ORAL | Status: DC | PRN
  Administered 2017-11-25: 650 mg via ORAL
  Filled 2017-11-23: qty 2

## 2017-11-23 MED ORDER — SENNOSIDES-DOCUSATE SODIUM 8.6-50 MG PO TABS: 1.0000 | ORAL_TABLET | Freq: Every evening | ORAL | Status: DC | PRN

## 2017-11-23 MED ORDER — ACETAMINOPHEN 650 MG RE SUPP: 650.0000 mg | Freq: Four times a day (QID) | RECTAL | Status: DC | PRN

## 2017-11-23 MED ORDER — INSULIN ASPART 100 UNIT/ML ~~LOC~~ SOLN
0.0000 [IU] | Freq: Three times a day (TID) | SUBCUTANEOUS | Status: DC
  Administered 2017-11-24: 3 [IU] via SUBCUTANEOUS
  Administered 2017-11-24 (×2): 4 [IU] via SUBCUTANEOUS
  Administered 2017-11-25: 3 [IU] via SUBCUTANEOUS
  Administered 2017-11-25 – 2017-11-26 (×4): 4 [IU] via SUBCUTANEOUS
  Administered 2017-11-27: 7 [IU] via SUBCUTANEOUS
  Administered 2017-11-27: 11 [IU] via SUBCUTANEOUS

## 2017-11-23 MED ORDER — INSULIN ASPART 100 UNIT/ML ~~LOC~~ SOLN
0.0000 [IU] | Freq: Every day | SUBCUTANEOUS | Status: DC
  Administered 2017-11-25 – 2017-11-26 (×2): 2 [IU] via SUBCUTANEOUS

## 2017-11-23 MED ORDER — FINASTERIDE 5 MG PO TABS
5.0000 mg | ORAL_TABLET | Freq: Every day | ORAL | Status: DC
  Administered 2017-11-24 – 2017-11-27 (×4): 5 mg via ORAL
  Filled 2017-11-23 (×4): qty 1

## 2017-11-23 MED ORDER — NADOLOL 20 MG PO TABS
10.0000 mg | ORAL_TABLET | Freq: Every day | ORAL | Status: DC
  Administered 2017-11-23 – 2017-11-25 (×3): 10 mg via ORAL
  Administered 2017-11-26: 22:00:00 via ORAL
  Filled 2017-11-23 (×4): qty 1

## 2017-11-23 MED ORDER — PANTOPRAZOLE SODIUM 40 MG PO TBEC
40.0000 mg | DELAYED_RELEASE_TABLET | Freq: Every day | ORAL | Status: DC
  Administered 2017-11-24 – 2017-11-27 (×4): 40 mg via ORAL
  Filled 2017-11-23 (×5): qty 1

## 2017-11-23 MED ORDER — INSULIN GLARGINE 100 UNIT/ML ~~LOC~~ SOLN
40.0000 [IU] | Freq: Two times a day (BID) | SUBCUTANEOUS | Status: DC
  Administered 2017-11-23 – 2017-11-24 (×2): 40 [IU] via SUBCUTANEOUS
  Filled 2017-11-23 (×3): qty 0.4

## 2017-11-23 MED ORDER — AMITRIPTYLINE HCL 25 MG PO TABS
25.0000 mg | ORAL_TABLET | Freq: Every day | ORAL | Status: DC
  Administered 2017-11-23 – 2017-11-26 (×4): 25 mg via ORAL
  Filled 2017-11-23 (×4): qty 1

## 2017-11-23 MED ORDER — PREGABALIN 75 MG PO CAPS
300.0000 mg | ORAL_CAPSULE | Freq: Two times a day (BID) | ORAL | Status: DC
  Administered 2017-11-23 – 2017-11-27 (×8): 300 mg via ORAL
  Filled 2017-11-23 (×8): qty 4

## 2017-11-23 MED ORDER — FOLIC ACID 1 MG PO TABS
1.0000 mg | ORAL_TABLET | Freq: Every day | ORAL | Status: DC
  Administered 2017-11-24 – 2017-11-27 (×4): 1 mg via ORAL
  Filled 2017-11-23 (×4): qty 1

## 2017-11-23 MED ORDER — SPIRONOLACTONE 25 MG PO TABS
25.0000 mg | ORAL_TABLET | Freq: Every day | ORAL | Status: DC
  Administered 2017-11-24 – 2017-11-27 (×4): 25 mg via ORAL
  Filled 2017-11-23 (×4): qty 1

## 2017-11-23 MED ORDER — SODIUM CHLORIDE 0.9 % IV BOLUS
500.0000 mL | Freq: Once | INTRAVENOUS | Status: AC
  Administered 2017-11-23: 500 mL via INTRAVENOUS

## 2017-11-23 MED ORDER — SODIUM CHLORIDE 0.9 % IV SOLN
INTRAVENOUS | Status: DC
  Administered 2017-11-23 – 2017-11-24 (×2): via INTRAVENOUS

## 2017-11-23 MED ORDER — CLOPIDOGREL BISULFATE 75 MG PO TABS
75.0000 mg | ORAL_TABLET | Freq: Every day | ORAL | Status: DC
  Administered 2017-11-24 – 2017-11-27 (×4): 75 mg via ORAL
  Filled 2017-11-23 (×4): qty 1

## 2017-11-23 MED ORDER — ALBUTEROL SULFATE (2.5 MG/3ML) 0.083% IN NEBU: 3.0000 mL | INHALATION_SOLUTION | Freq: Four times a day (QID) | RESPIRATORY_TRACT | Status: DC | PRN

## 2017-11-23 MED ORDER — TAMSULOSIN HCL 0.4 MG PO CAPS
0.4000 mg | ORAL_CAPSULE | Freq: Every day | ORAL | Status: DC
  Administered 2017-11-23 – 2017-11-26 (×4): 0.4 mg via ORAL
  Filled 2017-11-23 (×4): qty 1

## 2017-11-23 MED ORDER — SODIUM CHLORIDE 0.9 % IV SOLN
1.0000 g | INTRAVENOUS | Status: DC
  Administered 2017-11-24: 1 g via INTRAVENOUS
  Filled 2017-11-23 (×2): qty 10

## 2017-11-23 NOTE — ED Notes (Signed)
Patient transported to X-ray 

## 2017-11-23 NOTE — ED Triage Notes (Signed)
Pt to ED via [facility] for generalized weakness.  Pt fell 4 weeks ago with no injury and has been having increased generalized weakness.  Pt has progress weakness last 4 days.  Pt is jaundiced upon visual assessment.  Denies any CP, SOB, dizziness.  Pt reports that weakness has progressed to unable to stand on his own.  Pt is A&Ox4.

## 2017-11-23 NOTE — ED Provider Notes (Signed)
[facility] Provider Note   CSN: [id number] Arrival date & time: 11/23/17  1239     History   Chief Complaint Chief Complaint  Patient presents with  . Weakness    HPI Keith [name] is a 77 y.o. male.  Patient with multiple medical problems.  His wife states that he is fallen 4 times in the last week and the last couple days he has been extremely weak in his lower extremities.  She also noticed some dexterity problems in his left arm that started couple days ago  The history is provided by the patient and a relative. No language interpreter was used.  Weakness  Primary symptoms include loss of balance. This is a new problem. The current episode started more than 1 week ago. The problem has not changed since onset.There was no focality noted. There has been no fever (No fever). Pertinent negatives include no shortness of breath, no chest pain and no headaches. There were no medications administered prior to arrival. Associated medical issues do not include trauma.    Past Medical History:  Diagnosis Date  . Cirrhosis of liver ([place])   . Diabetes mellitus   . Hepatic cancer ([place])   . Hypertension   . Prostate hypertrophy   . Stroke ([place])   . Varicose veins     Patient Active Problem List   Diagnosis Date Noted  . Varicose veins of left lower extremity with complications 26/94/8546  . Knee pain, acute 01/26/2013  . Constipation 12/22/2012  . Neuropathy 12/22/2012  . Prostate hypertrophy 12/22/2012  . Acute blood loss anemia 12/22/2012  . Hyperlipidemia 12/22/2012  . Diabetes ([place]) 12/17/2012  . GERD (gastroesophageal reflux disease) 12/17/2012  . Hypertension 12/17/2012  . Thrombocytopenia, unspecified ([place]) 12/17/2012  . Postoperative anemia due to acute blood loss 12/15/2012  . Hyponatremia 12/15/2012  . OA (osteoarthritis) of knee 12/14/2012    Past Surgical History:  Procedure Laterality Date  . APPENDECTOMY    .  EYE SURGERY     bilateral cataract   . left arm surgery     age 43  . left hand surgery     tendons ripped on left hand  . LIVER RESECTION    . TONSILLECTOMY    . TOTAL KNEE ARTHROPLASTY Left 12/14/2012   Procedure: LEFT TOTAL KNEE ARTHROPLASTY;  Surgeon: Gearlean Alf, MD;  Location: [facility];  Service: Orthopedics;  Laterality: Left;        Home Medications    Prior to Admission medications   Medication Sig Start Date End Date Taking? Authorizing Provider  albuterol (PROVENTIL HFA;VENTOLIN HFA) 108 (90 BASE) MCG/ACT inhaler Inhale 1-2 puffs into the lungs every 6 (six) hours as needed for wheezing or shortness of breath.    [provider]  bisacodyl (DULCOLAX) 10 MG suppository Place 1 suppository (10 mg total) rectally daily as needed. Patient not taking: Reported on 03/04/2016 12/17/12   Dara Lords, Alexzandrew L, PA-C  cholecalciferol (VITAMIN D) 1000 UNITS tablet Take 1,000 Units by mouth daily.    [provider]  clopidogrel (PLAVIX) 75 MG tablet Take 75 mg by mouth daily.    [provider]  diphenhydrAMINE (BENADRYL) 12.5 MG/5ML elixir Take 5-10 mLs (12.5-25 mg total) by mouth every 4 (four) hours as needed for itching. 12/17/12   Perkins, Alexzandrew L, PA-C  docusate sodium 100 MG CAPS Take 100 mg by mouth 2 (two) times daily. Patient not taking: Reported on 03/04/2016 12/17/12   Dara Lords,  Alexzandrew L, PA-C  finasteride (PROSCAR) 5 MG tablet Take 5 mg by mouth daily.      [provider]  folic acid (FOLVITE) 1 MG tablet Take 1 mg by mouth daily. 02/13/09   [provider]  furosemide (LASIX) 40 MG tablet Take 40 mg by mouth every morning.    [provider]  hydrocerin (EUCERIN) CREA Apply 1 application topically daily as needed (dry skin).     [provider]  ibuprofen (ADVIL,MOTRIN) 200 MG tablet Take 200 mg by mouth every 6 (six) hours as needed for mild pain or moderate pain.    [provider]  insulin  aspart (NOVOLOG) 100 UNIT/ML injection Inject 2-10 Units into the skin 3 (three) times daily before meals. BS 150-200=2 units; 201-250=4 units; 251-300= 6 units; 301-350= 8 units; 350 and greater is 10 units    [provider]  insulin glargine (LANTUS) 100 UNIT/ML injection Inject 50 Units into the skin every evening.     [provider]  iron polysaccharides (NIFEREX) 150 MG capsule Take 1 capsule (150 mg total) by mouth 2 (two) times daily. Patient not taking: Reported on 03/04/2016 12/17/12   Dara Lords, Alexzandrew L, PA-C  loratadine (CLARITIN) 10 MG tablet Take 10 mg by mouth daily as needed for allergies.    [provider]  methocarbamol (ROBAXIN) 500 MG tablet Take 1 tablet (500 mg total) by mouth every 6 (six) hours as needed. Patient not taking: Reported on 03/04/2016 12/17/12   Dara Lords, Alexzandrew L, PA-C  metoCLOPramide (REGLAN) 5 MG tablet Take 1-2 tablets (5-10 mg total) by mouth every 8 (eight) hours as needed (if ondansetron (ZOFRAN) ineffective.). Patient not taking: Reported on 03/04/2016 12/17/12   Dara Lords, Alexzandrew L, PA-C  ondansetron (ZOFRAN) 4 MG tablet Take 1 tablet (4 mg total) by mouth every 6 (six) hours as needed for nausea. Patient not taking: Reported on 03/04/2016 12/17/12   Dara Lords, Alexzandrew L, PA-C  oxyCODONE (OXY IR/ROXICODONE) 5 MG immediate release tablet Take 1-3 tablets (5-15 mg total) by mouth every 4 (four) hours as needed. Patient not taking: Reported on 03/04/2016 12/17/12   Dara Lords, Alexzandrew L, PA-C  OxyCODONE (OXYCONTIN) 10 mg T12A 12 hr tablet Take one tablet by mouth every 12 hours. Do not crush Patient not taking: Reported on 03/04/2016 12/24/12   Lauree Chandler, NP  pantoprazole (PROTONIX) 40 MG tablet Take 40 mg by mouth daily.    [provider]  polyethylene glycol (MIRALAX / GLYCOLAX) packet Take 17 g by mouth daily as needed. Patient not taking: Reported on 03/04/2016 12/17/12   Dara Lords, Alexzandrew L,  PA-C  polyvinyl alcohol (LIQUIFILM TEARS) 1.4 % ophthalmic solution Place 1 drop into both eyes 2 (two) times daily as needed (dry eyes).    [provider]  pregabalin (LYRICA) 150 MG capsule Take 150 mg by mouth 2 (two) times daily.    [provider]  silver sulfADIAZINE (SILVADENE) 1 % cream Apply topically daily. Apply to the right toe daily and cover with a dry dressing. Patient not taking: Reported on 03/04/2016 12/17/12   Dara Lords, Alexzandrew L, PA-C  silver sulfADIAZINE (SILVADENE) 1 % cream Apply topically daily. Apply to right toe daily and cover with a dry dressing daily. Patient not taking: Reported on 03/04/2016 12/17/12   Dara Lords, Alexzandrew L, PA-C  silver sulfADIAZINE (SILVADENE) 1 % cream Apply topically daily. Patient not taking: Reported on 03/04/2016 12/17/12   Dara Lords, Alexzandrew L, PA-C  simvastatin (ZOCOR) 20 MG tablet Take  10 mg by mouth every evening.    [provider]  spironolactone (ALDACTONE) 100 MG tablet Take 100 mg by mouth every morning.     [provider]  tamsulosin (FLOMAX) 0.4 MG CAPS Take 0.4 mg by mouth daily after supper.     [provider]  traMADol (ULTRAM) 50 MG tablet Take 1-2 tablets (50-100 mg total) by mouth every 6 (six) hours as needed (mild pain). Patient taking differently: Take 50 mg by mouth 2 (two) times daily.  12/17/12   Perkins, Alexzandrew L, PA-C  warfarin (COUMADIN) 5 MG tablet Take 1.5-2 tablets (7.5-10 mg total) by mouth daily. Take Coumadin for three weeks for postoperative protocol and then the patient may resume their previous Coumadin home regimen.  The dose may need to be adjusted based upon the INR.  Please follow the INR and titrate Coumadin dose for a therapeutic range between 2.0 and 3.0 INR.  After three weeks of Coumadin, the patient may resume their previous Coumadin home regimen and their Plavix. Patient not taking: Reported on 03/04/2016 12/17/12   Joelene Millin, PA-C     Family History Family History  Problem Relation Age of Onset  . Stroke Father     Social History Social History   Tobacco Use  . Smoking status: Former Research scientist (life sciences)  . Smokeless tobacco: Former Systems developer    Quit date: 11/20/1983  Substance Use Topics  . Alcohol use: No    Alcohol/week: 0.0 standard drinks  . Drug use: No     Allergies   Fentanyl; Iodine; Merbromin; and Other   Review of Systems Review of Systems  Constitutional: Negative for appetite change and fatigue.  HENT: Negative for congestion, ear discharge and sinus pressure.   Eyes: Negative for discharge.  Respiratory: Negative for cough and shortness of breath.   Cardiovascular: Negative for chest pain.  Gastrointestinal: Negative for abdominal pain and diarrhea.  Genitourinary: Negative for frequency and hematuria.  Musculoskeletal: Negative for back pain.  Skin: Negative for rash.  Neurological: Positive for weakness and loss of balance. Negative for seizures and headaches.  Psychiatric/Behavioral: Negative for hallucinations.     Physical Exam Updated Vital Signs BP 123/69   Pulse 86   Temp 99 F (37.2 C) (Oral)   Resp (!) 21   Ht 5' 10.5" (1.791 m)   Wt (!) 139.3 kg   SpO2 98%   BMI 43.43 kg/m   Physical Exam  Constitutional: He is oriented to person, place, and time. He appears well-developed.  Obese  HENT:  Head: Normocephalic.  Eyes: Conjunctivae and EOM are normal. No scleral icterus.  Neck: Neck supple. No thyromegaly present.  Cardiovascular: Normal rate and regular rhythm. Exam reveals no gallop and no friction rub.  No murmur heard. Pulmonary/Chest: No stridor. He has no wheezes. He has no rales. He exhibits no tenderness.  Abdominal: He exhibits no distension. There is no tenderness. There is no rebound.  Musculoskeletal: He exhibits no edema.  She has profound weakness in both lower extremities.  Mild decreased coordination in the left arm.  Lymphadenopathy:    He has no cervical  adenopathy.  Neurological: He is oriented to person, place, and time. He exhibits normal muscle tone. Coordination normal.  Skin: No rash noted. No erythema.  Psychiatric: He has a normal mood and affect. His behavior is normal.     ED Treatments / Results  Labs (all labs ordered are listed, but only abnormal results are displayed) Labs Reviewed  BASIC METABOLIC  PANEL - Abnormal; Notable for the following components:      Result Value   Glucose, Bld 218 (*)    Calcium 8.5 (*)    All other components within normal limits  CBC - Abnormal; Notable for the following components:   RBC 3.86 (*)    Hemoglobin 12.5 (*)    HCT 37.4 (*)    Platelets 63 (*)    All other components within normal limits  URINALYSIS, ROUTINE W REFLEX MICROSCOPIC - Abnormal; Notable for the following components:   APPearance HAZY (*)    Hgb urine dipstick MODERATE (*)    Nitrite POSITIVE (*)    Leukocytes, UA LARGE (*)    WBC, UA >50 (*)    All other components within normal limits  HEPATIC FUNCTION PANEL - Abnormal; Notable for the following components:   Albumin 3.0 (*)    Total Bilirubin 2.9 (*)    Bilirubin, Direct 0.7 (*)    Indirect Bilirubin 2.2 (*)    All other components within normal limits  CBG MONITORING, ED - Abnormal; Notable for the following components:   Glucose-Capillary 192 (*)    All other components within normal limits  URINE CULTURE  LIPASE, BLOOD  PROTIME-INR  DIFFERENTIAL  I-STAT TROPONIN, ED    EKG EKG Interpretation  Date/Time:  Sunday November 23 2017 12:52:04 EDT Ventricular Rate:  82 PR Interval:    QRS Duration: 129 QT Interval:  387 QTC Calculation: 452 R Axis:   49 Text Interpretation:  Sinus rhythm Prolonged PR interval Left bundle branch block Baseline wander in lead(s) V2 Confirmed by Milton Ferguson [id number]) on 11/23/2017 4:16:52 PM   Radiology Dg Chest 2 View  Result Date: 11/23/2017 CLINICAL DATA:  Weakness EXAM: CHEST - 2 VIEW COMPARISON:  12/03/2012  FINDINGS: Cardiac enlargement. Negative for heart failure. Dual lead pacemaker has been placed since the prior study with leads in the right atrium and right ventricle region. Atherosclerotic aortic arch Negative for heart failure. Negative for pneumonia or effusion. Mild atelectasis in the bases. IMPRESSION: Cardiac enlargement.  Mild bibasilar atelectasis. Dual lead pacemaker Electronically Signed   By: Franchot Gallo M.D.   On: 11/23/2017 14:43   Ct Head Wo Contrast  Result Date: 11/23/2017 CLINICAL DATA:  Ataxia, suspect stroke. History of hypertension, diabetes, liver cancer. EXAM: CT HEAD WITHOUT CONTRAST TECHNIQUE: Contiguous axial images were obtained from the base of the skull through the vertex without intravenous contrast. COMPARISON:  CT HEAD June 08, 2012. FINDINGS: BRAIN: No intraparenchymal hemorrhage, mass effect nor midline shift. The ventricles and sulci are normal for age. Old bilateral basal ganglia and LEFT thalamus lacunar infarct, possible RIGHT old thalamus lacunar infarct. Old small RIGHT occipital lobe infarct. Patchy supratentorial white matter hypodensities within normal range for patient's age, though non-specific are most compatible with chronic small vessel ischemic disease. No acute large vascular territory infarcts. No abnormal extra-axial fluid collections. Basal cisterns are patent. VASCULAR: Moderate to severe calcific atherosclerosis of the carotid siphons. Unchanged focally hypodense RIGHT transverse sinus, potential arachnoid granulation. SKULL: No skull fracture. No significant scalp soft tissue swelling. SINUSES/ORBITS: Trace paranasal sinus mucosal thickening. Perforated nasal septum. Mastoid air cells are well aerated.The included ocular globes and orbital contents are non-suspicious. Status post bilateral ocular lens implants. OTHER: Patient is edentulous. IMPRESSION: 1. No acute intracranial process. 2. Old small RIGHT occipital lobe infarct.  Old lacunar infarcts. 3.  Mild chronic small vessel ischemic changes. Electronically Signed   By: Thana Farr.D.  On: 11/23/2017 16:03    Procedures Procedures (including critical care time)  Medications Ordered in ED Medications  cefTRIAXone (ROCEPHIN) 1 g in sodium chloride 0.9 % 100 mL IVPB (has no administration in time range)  sodium chloride 0.9 % bolus 500 mL (has no administration in time range)     Initial Impression / Assessment and Plan / ED Course  I have reviewed the triage vital signs and the nursing notes.  Pertinent labs & imaging results that were available during my care of the patient were reviewed by me and considered in my medical decision making (see chart for details).   Patient with multiple falls.  CT scan of the head shows old strokes.  Urinalysis shows UTI.  He will be admitted to medicine to treat his urinary tract infection and continued evaluation of his falls    Final Clinical Impressions(s) / ED Diagnoses   Final diagnoses:  Weakness  Lower urinary tract infectious disease    ED Discharge Orders    None       Milton Ferguson, MD 11/23/17 [phone]

## 2017-11-23 NOTE — ED Notes (Signed)
Patient transported to CT 

## 2017-11-23 NOTE — H&P (Signed)
History and Physical    Keith [name] HAL:[id number] DOB: Feb 10, 1941 DOA: 11/23/2017  PCP: Haywood Pao, MD Patient coming from: Home  Chief Complaint: Weakness  HPI: Keith [name] is a 77 y.o. male with medical history significant of diabetes mellitus type 2, cirrhosis with hepatocellular carcinoma, BPH, AV nodal block with pacemaker in place, obstructive sleep apnea on CPAP, peripheral neuropathy comes to the hospital for evaluation of weakness.  Patient states for the past 10 days he has been feeling very weak especially in his lower extremity.  Thinks most of his weakness is coming from his quadriceps and some achiness.  But over the course of last 4 days he has not been able to get out of bed much to get around.  Typically gets outpatient physical therapy.  Denies any numbness, tingling, headache, blurry vision, chest pain, shortness of breath and any other complaints.  Denies any dysuria, hematuria or burning with urination.  In the ER most of the patient's labs were unremarkable besides slightly elevated total and indirect bilirubin.  CBC was normal.  UA showed large leuk esterase, positive for nitrates, dipstick was positive, greater than 50 WBC but no bacteria was seen.  Only 6-10 RBC seen.  Patient was started on IV Rocephin.  CT of the head showed chronic and old ischemia but negative for acute pathology.  Medical team was requested to admit the patient.  When I saw the patient he was quite comfortable without any new complaints.  After prolonged discussion with the patient and his wife about the possibility of a stroke in which case he might need an MRI but it is difficult since he has pacemaker in place.  Wife and the patient tells me in the past his pacemaker has been turned off to get MRI of the brain.  I have explained them that at this time we will treat him with IV fluids antibiotics and check some labs.  If necessary we will consider MRI tomorrow.  They are in the  agreement with this.   Review of Systems: As per HPI otherwise 10 point review of systems negative.  Review of Systems Otherwise negative except as per HPI, including: General: Denies fever, chills, night sweats or unintended weight loss. Resp: Denies cough, wheezing, shortness of breath. Cardiac: Denies chest pain, palpitations, orthopnea, paroxysmal nocturnal dyspnea. GI: Denies abdominal pain, nausea, vomiting, diarrhea or constipation GU: Denies dysuria, frequency, hesitancy or incontinence MS: Denies muscle aches, joint pain or swelling Neuro: Denies headache, neurologic deficits (focal weakness, numbness, tingling), abnormal gait Psych: Denies anxiety, depression, SI/HI/AVH Skin: Denies new rashes or lesions ID: Denies sick contacts, exotic exposures, travel  Past Medical History:  Diagnosis Date  . Cirrhosis of liver ([place])   . Diabetes mellitus   . Hepatic cancer ([place])   . Hypertension   . Prostate hypertrophy   . Stroke ([place])   . Varicose veins     Past Surgical History:  Procedure Laterality Date  . APPENDECTOMY    . EYE SURGERY     bilateral cataract   . left arm surgery     age 40  . left hand surgery     tendons ripped on left hand  . LIVER RESECTION    . TONSILLECTOMY    . TOTAL KNEE ARTHROPLASTY Left 12/14/2012   Procedure: LEFT TOTAL KNEE ARTHROPLASTY;  Surgeon: Gearlean Alf, MD;  Location: [facility];  Service: Orthopedics;  Laterality: Left;    SOCIAL HISTORY:  reports that he has  quit smoking. He quit smokeless tobacco use about 34 years ago. He reports that he does not drink alcohol or use drugs.  Allergies  Allergen Reactions  . Fentanyl Itching and Rash  . Iodine Rash  . Merbromin Rash  . Other Rash    OPIODS OPIODS    FAMILY HISTORY: Family History  Problem Relation Age of Onset  . Stroke Father      Prior to Admission medications   Medication Sig Start Date End Date Taking? Authorizing Provider  albuterol (PROVENTIL HFA;VENTOLIN HFA)  108 (90 BASE) MCG/ACT inhaler Inhale 1-2 puffs into the lungs every 6 (six) hours as needed for wheezing or shortness of breath.    [provider]  bisacodyl (DULCOLAX) 10 MG suppository Place 1 suppository (10 mg total) rectally daily as needed. Patient not taking: Reported on 03/04/2016 12/17/12   Dara Lords, Alexzandrew L, PA-C  cholecalciferol (VITAMIN D) 1000 UNITS tablet Take 1,000 Units by mouth daily.    [provider]  clopidogrel (PLAVIX) 75 MG tablet Take 75 mg by mouth daily.    [provider]  diphenhydrAMINE (BENADRYL) 12.5 MG/5ML elixir Take 5-10 mLs (12.5-25 mg total) by mouth every 4 (four) hours as needed for itching. 12/17/12   Perkins, Alexzandrew L, PA-C  docusate sodium 100 MG CAPS Take 100 mg by mouth 2 (two) times daily. Patient not taking: Reported on 03/04/2016 12/17/12   Dara Lords, Alexzandrew L, PA-C  finasteride (PROSCAR) 5 MG tablet Take 5 mg by mouth daily.      [provider]  folic acid (FOLVITE) 1 MG tablet Take 1 mg by mouth daily. 02/13/09   [provider]  furosemide (LASIX) 40 MG tablet Take 40 mg by mouth every morning.    [provider]  hydrocerin (EUCERIN) CREA Apply 1 application topically daily as needed (dry skin).     [provider]  ibuprofen (ADVIL,MOTRIN) 200 MG tablet Take 200 mg by mouth every 6 (six) hours as needed for mild pain or moderate pain.    [provider]  insulin aspart (NOVOLOG) 100 UNIT/ML injection Inject 2-10 Units into the skin 3 (three) times daily before meals. BS 150-200=2 units; 201-250=4 units; 251-300= 6 units; 301-350= 8 units; 350 and greater is 10 units    [provider]  insulin glargine (LANTUS) 100 UNIT/ML injection Inject 50 Units into the skin every evening.     [provider]  iron polysaccharides (NIFEREX) 150 MG capsule Take 1 capsule (150 mg total) by mouth 2 (two) times daily. Patient not taking: Reported on 03/04/2016  12/17/12   Dara Lords, Alexzandrew L, PA-C  loratadine (CLARITIN) 10 MG tablet Take 10 mg by mouth daily as needed for allergies.    [provider]  methocarbamol (ROBAXIN) 500 MG tablet Take 1 tablet (500 mg total) by mouth every 6 (six) hours as needed. Patient not taking: Reported on 03/04/2016 12/17/12   Dara Lords, Alexzandrew L, PA-C  metoCLOPramide (REGLAN) 5 MG tablet Take 1-2 tablets (5-10 mg total) by mouth every 8 (eight) hours as needed (if ondansetron (ZOFRAN) ineffective.). Patient not taking: Reported on 03/04/2016 12/17/12   Dara Lords, Alexzandrew L, PA-C  ondansetron (ZOFRAN) 4 MG tablet Take 1 tablet (4 mg total) by mouth every 6 (six) hours as needed for nausea. Patient not taking: Reported on 03/04/2016 12/17/12   Dara Lords, Alexzandrew L, PA-C  oxyCODONE (OXY IR/ROXICODONE) 5 MG immediate release tablet Take 1-3 tablets (5-15 mg total) by mouth every 4 (four) hours as needed. Patient  not taking: Reported on 03/04/2016 12/17/12   Dara Lords, Alexzandrew L, PA-C  OxyCODONE (OXYCONTIN) 10 mg T12A 12 hr tablet Take one tablet by mouth every 12 hours. Do not crush Patient not taking: Reported on 03/04/2016 12/24/12   Lauree Chandler, NP  pantoprazole (PROTONIX) 40 MG tablet Take 40 mg by mouth daily.    [provider]  polyethylene glycol (MIRALAX / GLYCOLAX) packet Take 17 g by mouth daily as needed. Patient not taking: Reported on 03/04/2016 12/17/12   Dara Lords, Alexzandrew L, PA-C  polyvinyl alcohol (LIQUIFILM TEARS) 1.4 % ophthalmic solution Place 1 drop into both eyes 2 (two) times daily as needed (dry eyes).    [provider]  pregabalin (LYRICA) 150 MG capsule Take 150 mg by mouth 2 (two) times daily.    [provider]  silver sulfADIAZINE (SILVADENE) 1 % cream Apply topically daily. Apply to the right toe daily and cover with a dry dressing. Patient not taking: Reported on 03/04/2016 12/17/12   Dara Lords, Alexzandrew L, PA-C  silver sulfADIAZINE  (SILVADENE) 1 % cream Apply topically daily. Apply to right toe daily and cover with a dry dressing daily. Patient not taking: Reported on 03/04/2016 12/17/12   Dara Lords, Alexzandrew L, PA-C  silver sulfADIAZINE (SILVADENE) 1 % cream Apply topically daily. Patient not taking: Reported on 03/04/2016 12/17/12   Dara Lords, Alexzandrew L, PA-C  simvastatin (ZOCOR) 20 MG tablet Take 10 mg by mouth every evening.    [provider]  spironolactone (ALDACTONE) 100 MG tablet Take 100 mg by mouth every morning.     [provider]  tamsulosin (FLOMAX) 0.4 MG CAPS Take 0.4 mg by mouth daily after supper.     [provider]  traMADol (ULTRAM) 50 MG tablet Take 1-2 tablets (50-100 mg total) by mouth every 6 (six) hours as needed (mild pain). Patient taking differently: Take 50 mg by mouth 2 (two) times daily.  12/17/12   Perkins, Alexzandrew L, PA-C  warfarin (COUMADIN) 5 MG tablet Take 1.5-2 tablets (7.5-10 mg total) by mouth daily. Take Coumadin for three weeks for postoperative protocol and then the patient may resume their previous Coumadin home regimen.  The dose may need to be adjusted based upon the INR.  Please follow the INR and titrate Coumadin dose for a therapeutic range between 2.0 and 3.0 INR.  After three weeks of Coumadin, the patient may resume their previous Coumadin home regimen and their Plavix. Patient not taking: Reported on 03/04/2016 12/17/12   Joelene Millin, PA-C    Physical Exam: Vitals:   11/23/17 1530 11/23/17 1545 11/23/17 1600 11/23/17 1615  BP: 113/88 121/70 117/68 123/69  Pulse: 83 81 81 86  Resp: (!) 22 19 (!) 24 (!) 21  Temp:      TempSrc:      SpO2: 94% 94% 93% 98%  Weight:      Height:          Constitutional: NAD, calm, comfortable, morbid obesity Eyes: PERRL, lids and conjunctivae normal ENMT: Mucous membranes are moist. Posterior pharynx clear of any exudate or lesions.Normal dentition.  Neck: normal, supple, no masses, no  thyromegaly Respiratory: clear to auscultation bilaterally, no wheezing, no crackles. Normal respiratory effort. No accessory muscle use.  Cardiovascular: Regular rate and rhythm, no murmurs / rubs / gallops. No extremity edema. 2+ pedal pulses. No carotid bruits.  Abdomen: no tenderness, no masses palpated. No hepatosplenomegaly. Bowel sounds positive.  Musculoskeletal: no clubbing / cyanosis. No joint deformity upper and lower  extremities. Good ROM, no contractures. Normal muscle tone.  Skin: no rashes, lesions, ulcers. No induration Neurologic: CN 2-12 grossly intact. Sensation intact, DTR normal.  Bilateral upper extremity strength 5/5.  Bilateral lower extremity strength 4/5. Psychiatric: Normal judgment and insight. Alert and oriented x 3. Normal mood.     Labs on Admission: I have personally reviewed following labs and imaging studies  CBC: Recent Labs  Lab 11/23/17 1404  WBC 6.4  NEUTROABS 4.4  HGB 12.5*  HCT 37.4*  MCV 96.9  PLT 63*   Basic Metabolic Panel: Recent Labs  Lab 11/23/17 1404  NA 139  K 4.1  CL 105  CO2 26  GLUCOSE 218*  BUN 11  CREATININE 1.10  CALCIUM 8.5*   GFR: Estimated Creatinine Clearance: 82.2 mL/min (by C-G formula based on SCr of 1.1 mg/dL). Liver Function Tests: Recent Labs  Lab 11/23/17 1404  AST 32  ALT 22  ALKPHOS 89  BILITOT 2.9*  PROT 6.6  ALBUMIN 3.0*   Recent Labs  Lab 11/23/17 1404  LIPASE 23   No results for input(s): AMMONIA in the last 168 hours. Coagulation Profile: Recent Labs  Lab 11/23/17 1404  INR 1.09   Cardiac Enzymes: No results for input(s): CKTOTAL, CKMB, CKMBINDEX, TROPONINI in the last 168 hours. BNP (last 3 results) No results for input(s): PROBNP in the last 8760 hours. HbA1C: No results for input(s): HGBA1C in the last 72 hours. CBG: Recent Labs  Lab 11/23/17 1357  GLUCAP 192*   Lipid Profile: No results for input(s): CHOL, HDL, LDLCALC, TRIG, CHOLHDL, LDLDIRECT in the last 72  hours. Thyroid Function Tests: No results for input(s): TSH, T4TOTAL, FREET4, T3FREE, THYROIDAB in the last 72 hours. Anemia Panel: No results for input(s): VITAMINB12, FOLATE, FERRITIN, TIBC, IRON, RETICCTPCT in the last 72 hours. Urine analysis:    Component Value Date/Time   COLORURINE YELLOW 11/23/2017 1443   APPEARANCEUR HAZY (A) 11/23/2017 1443   LABSPEC 1.009 11/23/2017 1443   PHURINE 7.0 11/23/2017 1443   GLUCOSEU NEGATIVE 11/23/2017 1443   HGBUR MODERATE (A) 11/23/2017 1443   BILIRUBINUR NEGATIVE 11/23/2017 1443   KETONESUR NEGATIVE 11/23/2017 1443   PROTEINUR NEGATIVE 11/23/2017 1443   UROBILINOGEN 0.2 12/03/2012 1338   NITRITE POSITIVE (A) 11/23/2017 1443   LEUKOCYTESUR LARGE (A) 11/23/2017 1443   Sepsis Labs: !!!!!!!!!!!!!!!!!!!!!!!!!!!!!!!!!!!!!!!!!!!! @LABRCNTIP (procalcitonin:4,lacticidven:4) )No results found for this or any previous visit (from the past 240 hour(s)).   Radiological Exams on Admission: Dg Chest 2 View  Result Date: 11/23/2017 CLINICAL DATA:  Weakness EXAM: CHEST - 2 VIEW COMPARISON:  12/03/2012 FINDINGS: Cardiac enlargement. Negative for heart failure. Dual lead pacemaker has been placed since the prior study with leads in the right atrium and right ventricle region. Atherosclerotic aortic arch Negative for heart failure. Negative for pneumonia or effusion. Mild atelectasis in the bases. IMPRESSION: Cardiac enlargement.  Mild bibasilar atelectasis. Dual lead pacemaker Electronically Signed   By: Franchot Gallo M.D.   On: 11/23/2017 14:43   Ct Head Wo Contrast  Result Date: 11/23/2017 CLINICAL DATA:  Ataxia, suspect stroke. History of hypertension, diabetes, liver cancer. EXAM: CT HEAD WITHOUT CONTRAST TECHNIQUE: Contiguous axial images were obtained from the base of the skull through the vertex without intravenous contrast. COMPARISON:  CT HEAD June 08, 2012. FINDINGS: BRAIN: No intraparenchymal hemorrhage, mass effect nor midline shift. The ventricles  and sulci are normal for age. Old bilateral basal ganglia and LEFT thalamus lacunar infarct, possible RIGHT old thalamus lacunar infarct. Old small RIGHT occipital lobe infarct.  Patchy supratentorial white matter hypodensities within normal range for patient's age, though non-specific are most compatible with chronic small vessel ischemic disease. No acute large vascular territory infarcts. No abnormal extra-axial fluid collections. Basal cisterns are patent. VASCULAR: Moderate to severe calcific atherosclerosis of the carotid siphons. Unchanged focally hypodense RIGHT transverse sinus, potential arachnoid granulation. SKULL: No skull fracture. No significant scalp soft tissue swelling. SINUSES/ORBITS: Trace paranasal sinus mucosal thickening. Perforated nasal septum. Mastoid air cells are well aerated.The included ocular globes and orbital contents are non-suspicious. Status post bilateral ocular lens implants. OTHER: Patient is edentulous. IMPRESSION: 1. No acute intracranial process. 2. Old small RIGHT occipital lobe infarct.  Old lacunar infarcts. 3. Mild chronic small vessel ischemic changes. Electronically Signed   By: Elon Alas M.D.   On: 11/23/2017 16:03     All images have been reviewed by me personally.  EKG: Independently reviewed.  Normal sinus rhythm with left bundle branch  Assessment/Plan Principal Problem:   Weakness Active Problems:   OA (osteoarthritis) of knee   DM2 (diabetes mellitus, type 2) (H[facility])   GERD (gastroesophageal reflux disease)   Hypertension   Prostate hypertrophy   Other cirrhosis of liver ([facility])   OSA (obstructive sleep apnea)   Pacemaker    Generalized weakness especially bilateral lower extremity Urinary tract infection -Admit the patient to the hospital due to weakness. -CT of the head is negative for any acute infarct, shows old small right occipital infarct, old lacunar infarct.  Mild chronic small vessel ischemic changes. - UA is suggestive of  urinary tract infection but no bacteria seen.  Also has positive dipstick without much RBC therefore concern of rhabdo.  Will check CK levels - Normal saline 100 cc/h.  Provide supportive care - PT/OT tomorrow morning.  Urine cultures, IV Rocephin daily for now - Due to some concern of CVA, will give him full dose of aspirin.  If symptoms do not improve he may need MRI brain in the morning at which point his pacemaker can be turned off.  He has had done this several times in the past to get MRIs. -Check TSH, B12, Folate, Ammonia  Diabetes Mellitus Type 2 -Insulin sliding scale and Accu-Chek.  Diabetic diet.  Obstructive sleep apnea -CPAP ordered at bedtime  History of congestive heart failure with preserved ejection fraction, appears euvolemic -Currently remains without chest pain.  Not an active issue at this time.  Closely monitor fluid status.  Currently awaiting pharmacy to update medications her med rec can be completed.  History of cirrhosis with hepatocellular carcinoma -Follows at [facility] gastro[facility]ology.  Follow-up outpatient.  Peripheral neuropathy - Resume home Lyrica once medications have been confirmed.   DVT prophylaxis: Lovenox Code Status: Full code Family Communication: Wife at bedside Disposition Plan: To be determined Consults called: None Admission status: Observation admission   Time Spent: 65 minutes.  >50% of the time was devoted to discussing the patients care, assessment, plan and disposition with other care givers along with counseling the patient about the risks and benefits of treatment.   Please Note: This patient record was dictated using Editor, commissioning. Chart creation errors have been sought, but may not always have been located. Such creation errors do not reflect on the Standard of Medical Care.   Brittani Purdum Arsenio Loader MD [facility] Pager [phone]  If 7PM-7AM, please contact night-coverage [email or web address] Password [facility]  11/23/2017,  4:56 PM

## 2017-11-23 NOTE — ED Notes (Signed)
Pt returned from CT at this time.  

## 2017-11-23 NOTE — ED Notes (Signed)
Admitting provider at bedside for evaluation.

## 2017-11-23 NOTE — ED Notes (Signed)
Attempted to call report at this time.  Receiving RN unable to take report at this time and will call call back.

## 2017-11-23 NOTE — Progress Notes (Signed)
Patient arrived to the unit Alert and Oriented X 3 Disoriented to time.  Denies pain All questions and concerns addressed Bed in the lowest position with alarm set. Call light in reach.

## 2017-11-23 NOTE — Progress Notes (Signed)
CPAP setup and placed on patient. Patient unknown home setting so setup on Auto mode with no 02 bleeding in. Patient comfortable at this time.

## 2017-11-24 DIAGNOSIS — G629 Polyneuropathy, unspecified: Secondary | ICD-10-CM | POA: Diagnosis not present

## 2017-11-24 DIAGNOSIS — I10 Essential (primary) hypertension: Secondary | ICD-10-CM | POA: Diagnosis not present

## 2017-11-24 DIAGNOSIS — N390 Urinary tract infection, site not specified: Secondary | ICD-10-CM | POA: Diagnosis not present

## 2017-11-24 DIAGNOSIS — G4733 Obstructive sleep apnea (adult) (pediatric): Secondary | ICD-10-CM

## 2017-11-24 DIAGNOSIS — R531 Weakness: Secondary | ICD-10-CM | POA: Diagnosis not present

## 2017-11-24 LAB — COMPREHENSIVE METABOLIC PANEL
ALK PHOS: 74 U/L (ref 38–126)
ALT: 16 U/L (ref 0–44)
ANION GAP: 8 (ref 5–15)
AST: 24 U/L (ref 15–41)
Albumin: 2.5 g/dL — ABNORMAL LOW (ref 3.5–5.0)
BILIRUBIN TOTAL: 2.1 mg/dL — AB (ref 0.3–1.2)
BUN: 15 mg/dL (ref 8–23)
CALCIUM: 7.8 mg/dL — AB (ref 8.9–10.3)
CO2: 26 mmol/L (ref 22–32)
CREATININE: 1.15 mg/dL (ref 0.61–1.24)
Chloride: 106 mmol/L (ref 98–111)
Glucose, Bld: 135 mg/dL — ABNORMAL HIGH (ref 70–99)
Potassium: 3.6 mmol/L (ref 3.5–5.1)
SODIUM: 140 mmol/L (ref 135–145)
TOTAL PROTEIN: 5.4 g/dL — AB (ref 6.5–8.1)

## 2017-11-24 LAB — GLUCOSE, CAPILLARY
GLUCOSE-CAPILLARY: 190 mg/dL — AB (ref 70–99)
Glucose-Capillary: 122 mg/dL — ABNORMAL HIGH (ref 70–99)
Glucose-Capillary: 192 mg/dL — ABNORMAL HIGH (ref 70–99)
Glucose-Capillary: 182 mg/dL — ABNORMAL HIGH (ref 70–99)

## 2017-11-24 LAB — CBC
HCT: 34.4 % — ABNORMAL LOW (ref 39.0–52.0)
Hemoglobin: 11.5 g/dL — ABNORMAL LOW (ref 13.0–17.0)
MCH: 32.1 pg (ref 26.0–34.0)
MCHC: 33.4 g/dL (ref 30.0–36.0)
MCV: 96.1 fL (ref 78.0–100.0)
PLATELETS: 58 10*3/uL — AB (ref 150–400)
RBC: 3.58 MIL/uL — ABNORMAL LOW (ref 4.22–5.81)
RDW: 15.1 % (ref 11.5–15.5)
WBC: 5.2 10*3/uL (ref 4.0–10.5)

## 2017-11-24 MED ORDER — LACTULOSE 10 GM/15ML PO SOLN
10.0000 g | Freq: Every day | ORAL | Status: DC
  Administered 2017-11-24: 10 g via ORAL
  Filled 2017-11-24: qty 30

## 2017-11-24 MED ORDER — RIFAXIMIN 550 MG PO TABS
550.0000 mg | ORAL_TABLET | Freq: Two times a day (BID) | ORAL | Status: DC
  Administered 2017-11-24 – 2017-11-27 (×6): 550 mg via ORAL
  Filled 2017-11-24 (×6): qty 1

## 2017-11-24 MED ORDER — INSULIN GLARGINE 100 UNIT/ML ~~LOC~~ SOLN
40.0000 [IU] | Freq: Two times a day (BID) | SUBCUTANEOUS | Status: DC
  Administered 2017-11-24 – 2017-11-27 (×6): 40 [IU] via SUBCUTANEOUS
  Filled 2017-11-24 (×6): qty 0.4

## 2017-11-24 MED ORDER — POLYVINYL ALCOHOL 1.4 % OP SOLN
1.0000 [drp] | Freq: Two times a day (BID) | OPHTHALMIC | Status: DC | PRN
  Administered 2017-11-25 – 2017-11-26 (×2): 1 [drp] via OPHTHALMIC
  Filled 2017-11-24: qty 15

## 2017-11-24 NOTE — Care Management Note (Addendum)
Case Management Note  Patient Details  Name: Keith [name] MRN: [id number] Date of Birth: 04-25-1940  Subjective/Objective:    Pt in with weakness. He is from home with spouse. Pt states he has all DME at home but only uses a cane. Pt states he has no issues obtaining medications and no transportation issues.  PCP:  Dr Osborne Casco Insurance: [employer] medicare               Action/Plan: OT recommending SNF. Awaiting PT eval. CM following for d/c disposition.  Expected Discharge Date:                  Expected Discharge Plan:  Skilled Nursing Facility  In-House Referral:  Clinical Social Work  Discharge planning Services     Post Acute Care Choice:    Choice offered to:     DME Arranged:    DME Agency:     HH Arranged:    [place] Agency:     Status of Service:  In process, will continue to follow  If discussed at Long Length of Stay Meetings, dates discussed:    Additional Comments:  Pollie Friar, RN 11/24/2017, 2:12 PM

## 2017-11-24 NOTE — Progress Notes (Signed)
Progress Note    Keith [name]  KZS:[id number] DOB: 05-Feb-1941  DOA: 11/23/2017 PCP: Haywood Pao, MD    Brief Narrative:     Medical records reviewed and are as summarized below:  Keith [name] is an 77 y.o. male with medical history significant of diabetes mellitus type 2, cirrhosis with hepatocellular carcinoma, BPH, AV nodal block with pacemaker in place, obstructive sleep apnea on CPAP, peripheral neuropathy comes to the hospital for evaluation of weakness.  Patient states for the past 10 days he has been feeling very weak especially in his lower extremity.  Thinks most of his weakness is coming from his quadriceps and some achiness.  But over the course of last 4 days he has not been able to get out of bed much to get around.  Typically gets outpatient physical therapy.  Assessment/Plan:   Principal Problem:   Weakness Active Problems:   OA (osteoarthritis) of knee   DM2 (diabetes mellitus, type 2) (HCC)   GERD (gastroesophageal reflux disease)   Hypertension   Prostate hypertrophy   Other cirrhosis of liver (HCC)   OSA (obstructive sleep apnea)   Pacemaker  Generalized weakness especially bilateral lower extremity Urinary tract infection- culture with coag negative staph >100,000 -CT of the head is negative for any acute infarct, shows old small right occipital infarct, old lacunar infarct.  Mild chronic small vessel ischemic changes. - UA is suggestive of urinary tract infection - PT/OT- SNF -blood cultures  -IV Rocephin daily for now - Ammonia mildly elevated- per wife he is confused above his baseline -B12/TSH normal -CK normal  Diabetes Mellitus Type 2 -Insulin sliding scale -lantus  Obstructive sleep apnea -CPAP ordered at bedtime  History of congestive heart failure with preserved ejection fraction, appears euvolemic -stable  History of cirrhosis with hepatocellular carcinoma -Follows at [facility] gastroenterology -ammonia elevated  at 62-- refuses lactulose-- trial of xifaxam   Peripheral neuropathy - Resume home [place]   obesity Body mass index is 44.5 kg/m.   Family Communication/Anticipated D/C date and plan/Code Status   DVT prophylaxis: scd Code Status: Full Code.  Family Communication: wife on phone Disposition Plan: pending work up   Medical Consultants:    None.     Subjective:   Legs are better but still weak and not back to his baseline  Objective:    Vitals:   11/24/17 0500 11/24/17 0758 11/24/17 1215 11/24/17 1551  BP:  (!) 105/59 101/62 121/70  Pulse:  79 70 72  Resp:  20 15 20   Temp:  98.4 F (36.9 C) 98 F (36.7 C) 98.5 F (36.9 C)  TempSrc:  Oral Oral Oral  SpO2:  96% 97% 96%  Weight: (!) 142.7 kg     Height:        Intake/Output Summary (Last 24 hours) at 11/24/2017 1636 Last data filed at 11/23/2017 1725 Gross per 24 hour  Intake 100 ml  Output -  Net 100 ml   Filed Weights   11/23/17 1250 11/24/17 0500  Weight: (!) 139.3 kg (!) 142.7 kg    Exam: In bed, talking on phone-- has tremor r>l Wanders in story Obese abd Chronic skin changes Min LE edema  Data Reviewed:   I have personally reviewed following labs and imaging studies:  Labs: Labs show the following:   Basic Metabolic Panel: Recent Labs  Lab 11/23/17 1404 11/24/17 0555  NA 139 140  K 4.1 3.6  CL 105 106  CO2 26 26  GLUCOSE 218* 135*  BUN 11 15  CREATININE 1.10 1.15  CALCIUM 8.5* 7.8*   GFR Estimated Creatinine Clearance: 79.8 mL/min (by C-G formula based on SCr of 1.15 mg/dL). Liver Function Tests: Recent Labs  Lab 11/23/17 1404 11/24/17 0555  AST 32 24  ALT 22 16  ALKPHOS 89 74  BILITOT 2.9* 2.1*  PROT 6.6 5.4*  ALBUMIN 3.0* 2.5*   Recent Labs  Lab 11/23/17 1404  LIPASE 23   Recent Labs  Lab 11/23/17 1921  AMMONIA 60*   Coagulation profile Recent Labs  Lab 11/23/17 1404  INR 1.09    CBC: Recent Labs  Lab 11/23/17 1404 11/24/17 0555  WBC 6.4 5.2    NEUTROABS 4.4  --   HGB 12.5* 11.5*  HCT 37.4* 34.4*  MCV 96.9 96.1  PLT 63* 58*   Cardiac Enzymes: Recent Labs  Lab 11/23/17 1404  CKTOTAL 89   BNP (last 3 results) No results for input(s): PROBNP in the last 8760 hours. CBG: Recent Labs  Lab 11/23/17 1357 11/23/17 2240 11/24/17 0614 11/24/17 1116  GLUCAP 192* 127* 122* 192*   D-Dimer: No results for input(s): DDIMER in the last 72 hours. Hgb A1c: Recent Labs    11/23/17 1404  HGBA1C 6.6*   Lipid Profile: Recent Labs    11/23/17 1404  CHOL 149  HDL 37*  LDLCALC 96  TRIG 79  CHOLHDL 4.0   Thyroid function studies: Recent Labs    11/23/17 1404  TSH 1.943   Anemia work up: Recent Labs    11/23/17 1404  VITAMINB12 516   Sepsis Labs: Recent Labs  Lab 11/23/17 1404 11/24/17 0555  WBC 6.4 5.2    Microbiology Recent Results (from the past 240 hour(s))  Urine Culture     Status: Abnormal (Preliminary result)   Collection Time: 11/23/17  3:10 PM  Result Value Ref Range Status   Specimen Description URINE, RANDOM  Final   Special Requests   Final    NONE Performed at [facility], [place] [address] [place], [place] [address]    Culture (A)  Final    >=100,000 COLONIES/mL STAPHYLOCOCCUS SPECIES (COAGULASE NEGATIVE)   Report Status PENDING  Incomplete    Procedures and diagnostic studies:  Dg Chest 2 View  Result Date: 11/23/2017 CLINICAL DATA:  Weakness EXAM: CHEST - 2 VIEW COMPARISON:  12/03/2012 FINDINGS: Cardiac enlargement. Negative for heart failure. Dual lead pacemaker has been placed since the prior study with leads in the right atrium and right ventricle region. Atherosclerotic aortic arch Negative for heart failure. Negative for pneumonia or effusion. Mild atelectasis in the bases. IMPRESSION: Cardiac enlargement.  Mild bibasilar atelectasis. Dual lead pacemaker Electronically Signed   By: Franchot Gallo M.D.   On: 11/23/2017 14:43   Ct Head Wo Contrast  Result Date:  11/23/2017 CLINICAL DATA:  Ataxia, suspect stroke. History of hypertension, diabetes, liver cancer. EXAM: CT HEAD WITHOUT CONTRAST TECHNIQUE: Contiguous axial images were obtained from the base of the skull through the vertex without intravenous contrast. COMPARISON:  CT HEAD June 08, 2012. FINDINGS: BRAIN: No intraparenchymal hemorrhage, mass effect nor midline shift. The ventricles and sulci are normal for age. Old bilateral basal ganglia and LEFT thalamus lacunar infarct, possible RIGHT old thalamus lacunar infarct. Old small RIGHT occipital lobe infarct. Patchy supratentorial white matter hypodensities within normal range for patient's age, though non-specific are most compatible with chronic small vessel ischemic disease. No acute large vascular territory infarcts. No abnormal extra-axial fluid collections. Basal  cisterns are patent. VASCULAR: Moderate to severe calcific atherosclerosis of the carotid siphons. Unchanged focally hypodense RIGHT transverse sinus, potential arachnoid granulation. SKULL: No skull fracture. No significant scalp soft tissue swelling. SINUSES/ORBITS: Trace paranasal sinus mucosal thickening. Perforated nasal septum. Mastoid air cells are well aerated.The included ocular globes and orbital contents are non-suspicious. Status post bilateral ocular lens implants. OTHER: Patient is edentulous. IMPRESSION: 1. No acute intracranial process. 2. Old small RIGHT occipital lobe infarct.  Old lacunar infarcts. 3. Mild chronic small vessel ischemic changes. Electronically Signed   By: Elon Alas M.D.   On: 11/23/2017 16:03    Medications:   . amitriptyline  25 mg Oral QHS  . cholecalciferol  1,000 Units Oral Daily  . clopidogrel  75 mg Oral Daily  . enoxaparin (LOVENOX) injection  40 mg Subcutaneous Q24H  . finasteride  5 mg Oral Daily  . folic acid  1 mg Oral Daily  . insulin aspart  0-20 Units Subcutaneous TID WC  . insulin aspart  0-5 Units Subcutaneous QHS  . insulin  glargine  40 Units Subcutaneous BID  . memantine  20 mg Oral QHS  . nadolol  10 mg Oral QHS  . pantoprazole  40 mg Oral Q1200  . pregabalin  300 mg Oral BID  . rifaximin  550 mg Oral BID  . spironolactone  25 mg Oral Daily  . tamsulosin  0.4 mg Oral QPC supper  . traMADol  50 mg Oral BID   Continuous Infusions: . sodium chloride 100 mL/hr at 11/24/17 0628  . cefTRIAXone (ROCEPHIN)  IV       LOS: 0 days   Geradine Girt  [facility]   *Please refer to [email or web address], password [email or web address] to get updated schedule on who will round on this patient, as hospitalists switch teams weekly. If 7PM-7AM, please contact night-coverage at [email or web address], password [email or web address] for any overnight needs.  11/24/2017, 4:36 PM

## 2017-11-24 NOTE — Evaluation (Signed)
Occupational Therapy Evaluation Patient Details Name: Keith [name] MRN: [id number] DOB: 04-Apr-1941 Today's Date: 11/24/2017    History of Present Illness Pt is a 77 year old man admitted 11/23/17 with generalized weakness and inability to get OOB. PMH: DM, cirrhosis with hepatocellular carcinoma, pacemaker, OSA, CVA, HTN, peripheral neuropathy.   Clinical Impression   Pt reports he typically walks with a cane and his wife on one side. He reports walkers don't work for him. Pt is typically independent in self care and stands to shower. He reports many falls and attributes this to his peripheral neuropathy. Pt currently requires moderate assistance for bed mobility and to stand from an elevated surface. He requires set up to total assist for ADL. Pt is heavily dependent on B UE support in standing. Recommending continued rehab in SNF upon discharge.     Follow Up Recommendations  SNF;Supervision/Assistance - 24 hour    Equipment Recommendations  3 in 1 bedside commode    Recommendations for Other Services       Precautions / Restrictions Restrictions Weight Bearing Restrictions: No      Mobility Bed Mobility Overal bed mobility: Needs Assistance Bed Mobility: Supine to Sit;Sit to Supine     Supine to sit: Mod assist Sit to supine: Mod assist   General bed mobility comments: assist to raise trunk, increased time to position hips at EOB, assist for LEs into bed  Transfers Overall transfer level: Needs assistance Equipment used: Rolling walker (2 wheeled) Transfers: Sit to/from [employer] Sit to Stand: Mod assist;From elevated surface Stand pivot transfers: Min assist       General transfer comment: posterior bias, cues for hand placement, assist to rise and steady, pt reports he cannot feel his feet    Balance Overall balance assessment: Needs assistance Sitting-balance support: Feet supported Sitting balance-Leahy Scale: Fair       Standing  balance-Leahy Scale: Poor Standing balance comment: posterior lean initially                           ADL either performed or assessed with clinical judgement   ADL Overall ADL's : Needs assistance/impaired Eating/Feeding: Independent;Bed level   Grooming: Wash/dry hands;Wash/dry face;Sitting;Min guard   Upper Body Bathing: Minimal assistance;Sitting   Lower Body Bathing: Total assistance;Sit to/from stand   Upper Body Dressing : Minimal assistance;Sitting   Lower Body Dressing: Total assistance;Sit to/from stand   Toilet Transfer: Minimal assistance;Stand-pivot;RW;BSC   Toileting- Clothing Manipulation and Hygiene: Total assistance;Sit to/from stand               Vision Baseline Vision/History: Retinopathy(poor vision in L eye)       Perception     Praxis      Pertinent Vitals/Pain Pain Assessment: No/denies pain     Hand Dominance Right   Extremity/Trunk Assessment Upper Extremity Assessment Upper Extremity Assessment: Generalized weakness(hx of peripheral neuropathy)   Lower Extremity Assessment Lower Extremity Assessment: Defer to PT evaluation   Cervical / Trunk Assessment Cervical / Trunk Assessment: Other exceptions(obesity, weakness)   Communication Communication Communication: HOH(hears better out of L ear)   Cognition Arousal/Alertness: Awake/alert Behavior During Therapy: WFL for tasks assessed/performed Overall Cognitive Status: Impaired/Different from baseline Area of Impairment: Orientation;Memory                 Orientation Level: Time   Memory: Decreased short-term memory(repeats himself)         General Comments: pt reports "collapsing"  x 3, but unable to give timeline    General Comments       Exercises     Shoulder Instructions      Home Living Family/patient expects to be discharged to:: Private residence Living Arrangements: Spouse/significant other Available Help at Discharge: Family;Available 24  hours/day Type of Home: House Home Access: Stairs to enter [employer] of Steps: 2 Entrance Stairs-Rails: Right;Left;Can reach both Home Layout: One level     Bathroom Shower/Tub: Occupational psychologist: Handicapped height     Home Equipment: Grab bars - toilet;Grab bars - tub/shower;Walker - standard;Cane - single point;Other (comment)(lift chair)          Prior Functioning/Environment Level of Independence: Independent with assistive device(s)        Comments: walks with cane and his wife on L side due to vision and hearing        OT Problem List: Decreased strength;Decreased activity tolerance;Impaired balance (sitting and/or standing);Decreased coordination;Decreased cognition;Decreased safety awareness;Decreased knowledge of use of DME or AE;Obesity      OT Treatment/Interventions: Self-care/ADL training;DME and/or AE instruction;Cognitive remediation/compensation;Patient/family education;Balance training;Therapeutic activities    OT Goals(Current goals can be found in the care plan section) Acute Rehab OT Goals Patient Stated Goal: to stop falling OT Goal Formulation: With patient Time For Goal Achievement: 12/08/17 Potential to Achieve Goals: Good ADL Goals Pt Will Perform Grooming: with min guard assist;standing Pt Will Perform Upper Body Dressing: with set-up;sitting;with supervision Pt Will Perform Lower Body Dressing: with mod assist;sit to/from stand;with adaptive equipment Pt Will Transfer to Toilet: with min guard assist;ambulating;bedside commode Pt Will Perform Toileting - Clothing Manipulation and hygiene: with mod assist;sit to/from stand Pt/caregiver will Perform Home Exercise Program: Increased strength;Both right and left upper extremity;With theraband;With Supervision Additional ADL Goal #1: Pt will perform bed mobility with min guard assist in preparation for ADL.  OT Frequency: Min 2X/week   Barriers to D/C:             Co-evaluation              AM-PAC PT "6 Clicks" Daily Activity     Outcome Measure Help from another person eating meals?: None Help from another person taking care of personal grooming?: A Little Help from another person toileting, which includes using toliet, bedpan, or urinal?: Total Help from another person bathing (including washing, rinsing, drying)?: A Lot Help from another person to put on and taking off regular upper body clothing?: A Little Help from another person to put on and taking off regular lower body clothing?: Total 6 Click Score: 14   End of Session Equipment Utilized During Treatment: Gait belt;Rolling walker Nurse Communication: Mobility status  Activity Tolerance: Patient tolerated treatment well Patient left: in bed;with call bell/phone within reach;with bed alarm set  OT Visit Diagnosis: Unsteadiness on feet (R26.81);Other abnormalities of gait and mobility (R26.89);History of falling (Z91.81);Muscle weakness (generalized) (M62.81);Cognitive communication deficit (R41.841)                Time: 9924-2683 OT Time Calculation (min): 51 min Charges:  OT General Charges $OT Visit: 1 Visit OT Evaluation $OT Eval Moderate Complexity: 1 Mod OT Treatments $Self Care/Home Management : 23-37 mins  11/24/2017 Nestor Lewandowsky, OTR/L Pager: [phone]  Werner Lean, Haze Boyden 11/24/2017, 12:21 PM

## 2017-11-24 NOTE — Evaluation (Signed)
Physical Therapy Evaluation Patient Details Name: RICHMOND COLDREN MRN: [id number] DOB: 05-Feb-1941 Today's Date: 11/24/2017   History of Present Illness  Pt is a 77 year old man admitted 11/23/17 with generalized weakness and inability to get OOB. PMH: DM, cirrhosis with hepatocellular carcinoma, pacemaker, OSA, CVA, HTN, peripheral neuropathy.  Clinical Impression  Pt was able to be assessed and then to sit up in chair bedside with PT.  He is motivated to work but has edema and skin changes on LE's that are contributing to his difficulty standing and walking.  He is able to help control retropulsion with assistance but will need to focus on the directional challenges to increase his success speed in getting home from rehab.  Follow acutely for these needs including LE strength and standing balance.    Follow Up Recommendations SNF    Equipment Recommendations  None recommended by PT    Recommendations for Other Services       Precautions / Restrictions Precautions Precautions: Fall Restrictions Weight Bearing Restrictions: No      Mobility  Bed Mobility Overal bed mobility: Needs Assistance Bed Mobility: Supine to Sit     Supine to sit: Mod assist;Max assist     General bed mobility comments: assist to lift trunk from his side, then to scoot hips out to EOB with bed pad  Transfers Overall transfer level: Needs assistance Equipment used: Rolling walker (2 wheeled) Transfers: Sit to/from Stand Sit to Stand: Mod assist;From elevated surface Stand pivot transfers: Min assist       General transfer comment: pt was able to carefully step back to chair with vc's from his L side  Ambulation/Gait Ambulation/Gait assistance: Min assist Gait Distance (Feet): 8 Feet Assistive device: Rolling walker (2 wheeled);1 person hand held assist Gait Pattern/deviations: Step-through pattern;Step-to pattern;Decreased stride length;Wide base of support;Drifts right/left Gait  velocity: reduced Gait velocity interpretation: <1.8 ft/sec, indicate of risk for recurrent falls General Gait Details: pt is struggling with min assist to control trunk backward to step back to the chair  Stairs            Wheelchair Mobility    Modified Rankin (Stroke Patients Only)       Balance Overall balance assessment: Needs assistance Sitting-balance support: Feet supported Sitting balance-Leahy Scale: Fair   Postural control: Posterior lean Standing balance support: Bilateral upper extremity supported;During functional activity Standing balance-Leahy Scale: Poor                               Pertinent Vitals/Pain Pain Assessment: No/denies pain    Home Living Family/patient expects to be discharged to:: Private residence Living Arrangements: Spouse/significant other Available Help at Discharge: Family;Available 24 hours/day Type of Home: House Home Access: Stairs to enter Entrance Stairs-Rails: Right;Left;Can reach both Entrance Stairs-Number of Steps: 2 Home Layout: One level Home Equipment: Grab bars - toilet;Grab bars - tub/shower;Walker - standard;Cane - single point;Other (comment) Additional Comments: lift chair    Prior Function Level of Independence: Independent with assistive device(s)         Comments: has used SPC in the house     Hand Dominance   Dominant Hand: Right    Extremity/Trunk Assessment   Upper Extremity Assessment Upper Extremity Assessment: Generalized weakness    Lower Extremity Assessment Lower Extremity Assessment: Generalized weakness    Cervical / Trunk Assessment Cervical / Trunk Assessment: Normal  Communication   Communication: HOH(slow speech up close used  with better comprehension)  Cognition Arousal/Alertness: Awake/alert Behavior During Therapy: WFL for tasks assessed/performed Overall Cognitive Status: Impaired/Different from baseline Area of Impairment: Problem  solving;Awareness;Safety/judgement;Following commands                 Orientation Level: Time   Memory: Decreased short-term memory Following Commands: Follows one step commands with increased time Safety/Judgement: Decreased awareness of safety;Decreased awareness of deficits Awareness: Intellectual Problem Solving: Slow processing;Difficulty sequencing;Requires verbal cues        General Comments      Exercises     Assessment/Plan    PT Assessment Patient needs continued PT services  PT Problem List Decreased strength;Decreased range of motion;Decreased activity tolerance;Decreased balance;Decreased mobility;Decreased coordination;Decreased cognition;Decreased knowledge of use of DME;Decreased safety awareness;Cardiopulmonary status limiting activity;Obesity;Decreased skin integrity       PT Treatment Interventions DME instruction;Gait training;Functional mobility training;Therapeutic activities;Therapeutic exercise;Balance training;Neuromuscular re-education;Patient/family education    PT Goals (Current goals can be found in the Care Plan section)  Acute Rehab PT Goals Patient Stated Goal: to get to walk better PT Goal Formulation: With patient Time For Goal Achievement: 12/08/17 Potential to Achieve Goals: Good    Frequency Min 2X/week   Barriers to discharge Inaccessible home environment;Decreased caregiver support home with wife who is not a two person assist of mobility    Co-evaluation               AM-PAC PT "6 Clicks" Daily Activity  Outcome Measure Difficulty turning over in bed (including adjusting bedclothes, sheets and blankets)?: Unable Difficulty moving from lying on back to sitting on the side of the bed? : Unable Difficulty sitting down on and standing up from a chair with arms (e.g., wheelchair, bedside commode, etc,.)?: Unable Help needed moving to and from a bed to chair (including a wheelchair)?: A Lot Help needed walking in hospital  room?: A Little Help needed climbing 3-5 steps with a railing? : Total 6 Click Score: 9    End of Session Equipment Utilized During Treatment: Gait belt Activity Tolerance: Patient limited by fatigue;Treatment limited secondary to medical complications (Comment) Patient left: in chair;with call bell/phone within reach;with chair alarm set Nurse Communication: Mobility status PT Visit Diagnosis: Unsteadiness on feet (R26.81);Muscle weakness (generalized) (M62.81);Difficulty in walking, not elsewhere classified (R26.2)    Time: 1325-1411 PT Time Calculation (min) (ACUTE ONLY): 46 min   Charges:   PT Evaluation $PT Eval Moderate Complexity: 1 Mod PT Treatments $Gait Training: 8-22 mins $Therapeutic Exercise: 8-22 mins       Ramond Dial 11/24/2017, 5:34 PM   Mee Hives, PT MS Acute Rehab Dept. Number: [place] [facility] [place]

## 2017-11-25 ENCOUNTER — Encounter (HOSPITAL_COMMUNITY): Payer: Self-pay | Admitting: General Practice

## 2017-11-25 ENCOUNTER — Ambulatory Visit: Payer: Medicare Other | Admitting: Physical Therapy

## 2017-11-25 DIAGNOSIS — R531 Weakness: Secondary | ICD-10-CM | POA: Diagnosis not present

## 2017-11-25 DIAGNOSIS — G4733 Obstructive sleep apnea (adult) (pediatric): Secondary | ICD-10-CM | POA: Diagnosis not present

## 2017-11-25 DIAGNOSIS — G629 Polyneuropathy, unspecified: Secondary | ICD-10-CM | POA: Diagnosis not present

## 2017-11-25 DIAGNOSIS — I10 Essential (primary) hypertension: Secondary | ICD-10-CM | POA: Diagnosis not present

## 2017-11-25 LAB — URINE CULTURE: Culture: >=100000 — AB

## 2017-11-25 LAB — FOLATE RBC
Folate, Hemolysate: 514.2 ng/mL
Folate, RBC: 1368 ng/mL (ref >498)
HEMATOCRIT: 37.6 % (ref 37.5–51.0)

## 2017-11-25 LAB — GLUCOSE, CAPILLARY
GLUCOSE-CAPILLARY: 108 mg/dL — AB (ref 70–99)
GLUCOSE-CAPILLARY: 84 mg/dL (ref 70–99)
GLUCOSE-CAPILLARY: 201 mg/dL — AB (ref 70–99)
Glucose-Capillary: 121 mg/dL — ABNORMAL HIGH (ref 70–99)
Glucose-Capillary: 151 mg/dL — ABNORMAL HIGH (ref 70–99)

## 2017-11-25 MED ORDER — HYDROCODONE-ACETAMINOPHEN 5-325 MG PO TABS
1.0000 | ORAL_TABLET | ORAL | Status: DC | PRN
  Administered 2017-11-26: 1 via ORAL
  Filled 2017-11-25: qty 1

## 2017-11-25 NOTE — Progress Notes (Signed)
Progress Note    Keith [name]  JJH:[id number] DOB: [birth year]  DOA: 11/23/2017 PCP: Haywood Pao, MD    Brief Narrative:     Medical records reviewed and are as summarized below:  Keith [name] is an 77 y.o. male with medical history significant of diabetes mellitus type 2, cirrhosis with hepatocellular carcinoma, BPH, AV nodal block with pacemaker in place, obstructive sleep apnea on CPAP, peripheral neuropathy comes to the hospital for evaluation of weakness.  Patient states for the past 10 days he has been feeling very weak especially in his lower extremity.  Thinks most of his weakness is coming from his quadriceps and some achiness.  But over the course of last 4 days he has not been able to get out of bed much to get around.  Typically gets outpatient physical therapy.  Assessment/Plan:   Principal Problem:   Weakness Active Problems:   OA (osteoarthritis) of knee   DM2 (diabetes mellitus, type 2) (HCC)   GERD (gastroesophageal reflux disease)   Hypertension   Prostate hypertrophy   Other cirrhosis of liver (HCC)   OSA (obstructive sleep apnea)   Pacemaker  Generalized weakness especially bilateral lower extremity Urinary tract infection- culture with coag negative staph >100,000- suspect contaminent -CT of the head is negative for any acute infarct, shows old small right occipital infarct, old lacunar infarct.  Mild chronic small vessel ischemic changes. - PT/OT- SNF -blood cultures  - Ammonia mildly elevated- per wife he is confused above his baseline -B12/TSH normal -CK normal  Diabetes Mellitus Type 2 -Insulin sliding scale -lantus  Obstructive sleep apnea -CPAP ordered at bedtime  History of congestive heart failure with preserved ejection fraction, appears euvolemic -stable  History of cirrhosis with hepatocellular carcinoma -Follows at [facility] gastroenterology -ammonia elevated at 60-- refuses lactulose-- trial of xifaxam    Peripheral neuropathy - Resume home Lyrica   obesity Body mass index is 45.28 kg/m.   Family Communication/Anticipated D/C date and plan/Code Status   DVT prophylaxis: scd Code Status: Full Code.  Family Communication: wife on phone 8/20 Disposition Plan: pending work up   Medical Consultants:    None.   Subjective:   Moving legs better  Objective:    Vitals:   11/24/17 2316 11/25/17 0357 11/25/17 0530 11/25/17 0801  BP: (!) 113/92 131/66  116/72  Pulse: 82 67  65  Resp: 16 18  18   Temp: 98.4 F (36.9 C) 98.4 F (36.9 C)  97.8 F (36.6 C)  TempSrc:    Oral  SpO2: 96% 95%  98%  Weight:   (!) 145.2 kg   Height:        Intake/Output Summary (Last 24 hours) at 11/25/2017 1508 Last data filed at 11/25/2017 1341 Gross per 24 hour  Intake 480 ml  Output 1500 ml  Net -1020 ml   Filed Weights   11/23/17 1250 11/24/17 0500 11/25/17 0530  Weight: (!) 139.3 kg (!) 142.7 kg (!) 145.2 kg    Exam: In bed, pleasant NAD   Data Reviewed:   I have personally reviewed following labs and imaging studies:  Labs: Labs show the following:   Basic Metabolic Panel: Recent Labs  Lab 11/23/17 1404 11/24/17 0555  NA 139 140  K 4.1 3.6  CL 105 106  CO2 26 26  GLUCOSE 218* 135*  BUN 11 15  CREATININE 1.10 1.15  CALCIUM 8.5* 7.8*   GFR Estimated Creatinine Clearance: 80.5 mL/min (by C-G formula based on SCr  of 1.15 mg/dL). Liver Function Tests: Recent Labs  Lab 11/23/17 1404 11/24/17 0555  AST 32 24  ALT 22 16  ALKPHOS 89 74  BILITOT 2.9* 2.1*  PROT 6.6 5.4*  ALBUMIN 3.0* 2.5*   Recent Labs  Lab 11/23/17 1404  LIPASE 23   Recent Labs  Lab 11/23/17 1921  AMMONIA 60*   Coagulation profile Recent Labs  Lab 11/23/17 1404  INR 1.09    CBC: Recent Labs  Lab 11/23/17 1404 11/24/17 0555  WBC 6.4 5.2  NEUTROABS 4.4  --   HGB 12.5* 11.5*  HCT 37.4* 34.4*  MCV 96.9 96.1  PLT 63* 58*   Cardiac Enzymes: Recent Labs  Lab  11/23/17 1404  CKTOTAL 89   BNP (last 3 results) No results for input(s): PROBNP in the last 8760 hours. CBG: Recent Labs  Lab 11/24/17 1645 11/24/17 2133 11/25/17 0629 11/25/17 0759 11/25/17 1203  GLUCAP 190* 182* 108* 84 121*   D-Dimer: No results for input(s): DDIMER in the last 72 hours. Hgb A1c: Recent Labs    11/23/17 1404  HGBA1C 6.6*   Lipid Profile: Recent Labs    11/23/17 1404  CHOL 149  HDL 37*  LDLCALC 96  TRIG 79  CHOLHDL 4.0   Thyroid function studies: Recent Labs    11/23/17 1404  TSH 1.943   Anemia work up: Recent Labs    11/23/17 1404  VITAMINB12 516   Sepsis Labs: Recent Labs  Lab 11/23/17 1404 11/24/17 0555  WBC 6.4 5.2    Microbiology Recent Results (from the past 240 hour(s))  Urine Culture     Status: Abnormal   Collection Time: 11/23/17  3:10 PM  Result Value Ref Range Status   Specimen Description URINE, RANDOM  Final   Special Requests   Final    NONE Performed at [facility], [place] [address] [place], [place] [address]    Culture (A)  Final    >=100,000 COLONIES/mL STAPHYLOCOCCUS SPECIES (COAGULASE NEGATIVE)   Report Status 11/25/2017 FINAL  Final   Organism ID, Bacteria STAPHYLOCOCCUS SPECIES (COAGULASE NEGATIVE) (A)  Final      Susceptibility   Staphylococcus species (coagulase negative) - MIC*    CIPROFLOXACIN <=0.5 SENSITIVE Sensitive     GENTAMICIN <=0.5 SENSITIVE Sensitive     NITROFURANTOIN <=16 SENSITIVE Sensitive     OXACILLIN >=4 RESISTANT Resistant     TETRACYCLINE 4 SENSITIVE Sensitive     VANCOMYCIN 1 SENSITIVE Sensitive     TRIMETH/SULFA <=10 SENSITIVE Sensitive     CLINDAMYCIN <=0.25 SENSITIVE Sensitive     RIFAMPIN <=0.5 SENSITIVE Sensitive     Inducible Clindamycin NEGATIVE Sensitive     * >=100,000 COLONIES/mL STAPHYLOCOCCUS SPECIES (COAGULASE NEGATIVE)    Procedures and diagnostic studies:  Ct Head Wo Contrast  Result Date: 11/23/2017 CLINICAL DATA:  Ataxia, suspect stroke.  History of hypertension, diabetes, liver cancer. EXAM: CT HEAD WITHOUT CONTRAST TECHNIQUE: Contiguous axial images were obtained from the base of the skull through the vertex without intravenous contrast. COMPARISON:  CT HEAD June 08, 2012. FINDINGS: BRAIN: No intraparenchymal hemorrhage, mass effect nor midline shift. The ventricles and sulci are normal for age. Old bilateral basal ganglia and LEFT thalamus lacunar infarct, possible RIGHT old thalamus lacunar infarct. Old small RIGHT occipital lobe infarct. Patchy supratentorial white matter hypodensities within normal range for patient's age, though non-specific are most compatible with chronic small vessel ischemic disease. No acute large vascular territory infarcts. No abnormal extra-axial fluid collections. Basal cisterns are  patent. VASCULAR: Moderate to severe calcific atherosclerosis of the carotid siphons. Unchanged focally hypodense RIGHT transverse sinus, potential arachnoid granulation. SKULL: No skull fracture. No significant scalp soft tissue swelling. SINUSES/ORBITS: Trace paranasal sinus mucosal thickening. Perforated nasal septum. Mastoid air cells are well aerated.The included ocular globes and orbital contents are non-suspicious. Status post bilateral ocular lens implants. OTHER: Patient is edentulous. IMPRESSION: 1. No acute intracranial process. 2. Old small RIGHT occipital lobe infarct.  Old lacunar infarcts. 3. Mild chronic small vessel ischemic changes. Electronically Signed   By: Elon Alas M.D.   On: 11/23/2017 16:03    Medications:   . amitriptyline  25 mg Oral QHS  . cholecalciferol  1,000 Units Oral Daily  . clopidogrel  75 mg Oral Daily  . finasteride  5 mg Oral Daily  . folic acid  1 mg Oral Daily  . insulin aspart  0-20 Units Subcutaneous TID WC  . insulin aspart  0-5 Units Subcutaneous QHS  . insulin glargine  40 Units Subcutaneous BID  . memantine  20 mg Oral QHS  . nadolol  10 mg Oral QHS  . pantoprazole  40  mg Oral Q1200  . pregabalin  300 mg Oral BID  . rifaximin  550 mg Oral BID  . spironolactone  25 mg Oral Daily  . tamsulosin  0.4 mg Oral QPC supper  . traMADol  50 mg Oral BID   Continuous Infusions:    LOS: 0 days   Geradine Girt  [facility]   *Please refer to [email or web address], password [email or web address] to get updated schedule on who will round on this patient, as hospitalists switch teams weekly. If 7PM-7AM, please contact night-coverage at [email or web address], password [email or web address] for any overnight needs.  11/25/2017, 3:08 PM

## 2017-11-25 NOTE — Care Management Obs Status (Signed)
[place] NOTIFICATION   Patient Details  Name: LAVONTAY KIRK MRN: [id number] Date of Birth: 03-09-1941   Medicare Observation Status Notification Given:  Yes    Pollie Friar, RN 11/25/2017, 12:31 PM

## 2017-11-25 NOTE — Progress Notes (Signed)
Patient will self administer CPAP when ready for bed.  Patient wears CPAP at home and is familiar with equipment and procedure. Patient instructed to contact RN or RT with any concerns.

## 2017-11-25 NOTE — Progress Notes (Signed)
CM met with patient and spoke to wife over the phone. They are interested in being faxed out in [facility] but prefer [facility] for SNF rehab. CSW updated.

## 2017-11-25 NOTE — NC FL2 (Signed)
[place] LEVEL OF CARE SCREENING TOOL     IDENTIFICATION  Patient Name: Keith [name] Birthdate: 1941/03/10 Sex: male Admission Date (Current Location): 11/23/2017  [facility] and [place] Number:  Herbalist and Address:  [facility], [place] [address] [place], [place] [address]      Provider Number: [id number]  Attending Physician Name and Address:  Geradine Girt, DO  Relative Name and Phone Number:       Current Level of Care: Hospital Recommended Level of Care: [place] Prior Approval Number:    Date Approved/Denied:   PASRR Number: [id number]  Discharge Plan: SNF    Current Diagnoses: Patient Active Problem List   Diagnosis Date Noted  . Weakness 11/23/2017  . Other cirrhosis of liver ([place]) 11/23/2017  . OSA (obstructive sleep apnea) 11/23/2017  . Pacemaker 11/23/2017  . Varicose veins of left lower extremity with complications 59/93/5701  . Knee pain, acute 01/26/2013  . Constipation 12/22/2012  . Neuropathy 12/22/2012  . Prostate hypertrophy 12/22/2012  . Acute blood loss anemia 12/22/2012  . Hyperlipidemia 12/22/2012  . DM2 (diabetes mellitus, type 2) ([place]) 12/17/2012  . GERD (gastroesophageal reflux disease) 12/17/2012  . Hypertension 12/17/2012  . Thrombocytopenia, unspecified ([place]) 12/17/2012  . Postoperative anemia due to acute blood loss 12/15/2012  . Hyponatremia 12/15/2012  . OA (osteoarthritis) of knee 12/14/2012    Orientation RESPIRATION BLADDER Height & Weight     Self, Place  Normal, Other (Comment)(CPAP at night) Incontinent Weight: (!) 320 lb 1.7 oz (145.2 kg) Height:  5' 10.5" (179.1 cm)  BEHAVIORAL SYMPTOMS/MOOD NEUROLOGICAL BOWEL NUTRITION STATUS      Continent Diet(heart healthy, carb modified)  AMBULATORY STATUS COMMUNICATION OF NEEDS Skin   Extensive Assist Verbally Normal                       Personal Care Assistance Level of Assistance  Bathing,  Feeding, Dressing Bathing Assistance: Maximum assistance Feeding assistance: Limited assistance Dressing Assistance: Maximum assistance     Functional Limitations Info  Sight, Hearing, Speech Sight Info: Impaired Hearing Info: Adequate Speech Info: Adequate    SPECIAL CARE FACTORS FREQUENCY  PT (By licensed PT), OT (By licensed OT)     PT Frequency: 5x/wk OT Frequency: 5x/wk            Contractures Contractures Info: Not present    Additional Factors Info  Code Status, Allergies, Insulin Sliding Scale Code Status Info: Full Allergies Info: Fentanyl, Iodine, Merbromin, Other   Insulin Sliding Scale Info: 0-20 units 3x/day with meals; 0-5 units daily at bed; Lantus 40 units 2x/day       Current Medications (11/25/2017):  This is the current hospital active medication list Current Facility-Administered Medications  Medication Dose Route Frequency Provider Last Rate Last Dose  . acetaminophen (TYLENOL) tablet 650 mg  650 mg Oral Q6H PRN Amin, Ankit Chirag, MD       Or  . acetaminophen (TYLENOL) suppository 650 mg  650 mg Rectal Q6H PRN Amin, Ankit Chirag, MD      . albuterol (PROVENTIL) (2.5 MG/3ML) 0.083% nebulizer solution 3 mL  3 mL Inhalation Q6H PRN Amin, Ankit Chirag, MD      . amitriptyline (ELAVIL) tablet 25 mg  25 mg Oral QHS Amin, Ankit Chirag, MD   25 mg at 11/24/17 2129  . cefTRIAXone (ROCEPHIN) 1 g in sodium chloride 0.9 % 100 mL IVPB  1 g Intravenous Q24H Amin,  Ankit Chirag, MD 200 mL/hr at 11/24/17 1803 1 g at 11/24/17 1803  . cholecalciferol (VITAMIN D) tablet 1,000 Units  1,000 Units Oral Daily Damita Lack, MD   1,000 Units at 11/25/17 0847  . clopidogrel (PLAVIX) tablet 75 mg  75 mg Oral Daily Amin, Ankit Chirag, MD   75 mg at 11/25/17 0846  . finasteride (PROSCAR) tablet 5 mg  5 mg Oral Daily Amin, Ankit Chirag, MD   5 mg at 11/25/17 0845  . folic acid (FOLVITE) tablet 1 mg  1 mg Oral Daily Amin, Ankit Chirag, MD   1 mg at 11/25/17 0845  .  HYDROcodone-acetaminophen (NORCO/VICODIN) 5-325 MG per tablet 1-2 tablet  1-2 tablet Oral Q4H PRN Amin, Ankit Chirag, MD      . insulin aspart (novoLOG) injection 0-20 Units  0-20 Units Subcutaneous TID WC Amin, Jeanella Flattery, MD   4 Units at 11/24/17 1745  . insulin aspart (novoLOG) injection 0-5 Units  0-5 Units Subcutaneous QHS Amin, Ankit Chirag, MD      . insulin glargine (LANTUS) injection 40 Units  40 Units Subcutaneous BID Eulogio Bear U, DO   40 Units at 11/25/17 0845  . memantine (NAMENDA) tablet 20 mg  20 mg Oral QHS Amin, Ankit Chirag, MD   20 mg at 11/24/17 2128  . nadolol (CORGARD) tablet 10 mg  10 mg Oral QHS Amin, Ankit Chirag, MD   10 mg at 11/24/17 2129  . pantoprazole (PROTONIX) EC tablet 40 mg  40 mg Oral Q1200 Amin, Ankit Chirag, MD   40 mg at 11/24/17 1229  . polyvinyl alcohol (LIQUIFILM TEARS) 1.4 % ophthalmic solution 1 drop  1 drop Both Eyes BID PRN Eulogio Bear U, DO      . pregabalin (LYRICA) capsule 300 mg  300 mg Oral BID Amin, Ankit Chirag, MD   300 mg at 11/25/17 0847  . rifaximin (XIFAXAN) tablet 550 mg  550 mg Oral BID Eulogio Bear U, DO   550 mg at 11/25/17 0846  . senna-docusate (Senokot-S) tablet 1 tablet  1 tablet Oral QHS PRN Amin, Ankit Chirag, MD      . spironolactone (ALDACTONE) tablet 25 mg  25 mg Oral Daily Amin, Ankit Chirag, MD   25 mg at 11/25/17 0847  . tamsulosin (FLOMAX) capsule 0.4 mg  0.4 mg Oral QPC supper Amin, Ankit Chirag, MD   0.4 mg at 11/24/17 1759  . traMADol (ULTRAM) tablet 50 mg  50 mg Oral BID Damita Lack, MD   50 mg at 11/25/17 0848     Discharge Medications: Please see discharge summary for a list of discharge medications.  Relevant Imaging Results:  Relevant Lab Results:   Additional Information SS#: [id number]  Geralynn Ochs, LCSW

## 2017-11-26 DIAGNOSIS — R531 Weakness: Secondary | ICD-10-CM | POA: Diagnosis not present

## 2017-11-26 DIAGNOSIS — G4733 Obstructive sleep apnea (adult) (pediatric): Secondary | ICD-10-CM | POA: Diagnosis not present

## 2017-11-26 DIAGNOSIS — G629 Polyneuropathy, unspecified: Secondary | ICD-10-CM | POA: Diagnosis not present

## 2017-11-26 DIAGNOSIS — I10 Essential (primary) hypertension: Secondary | ICD-10-CM | POA: Diagnosis not present

## 2017-11-26 LAB — GLUCOSE, CAPILLARY
Glucose-Capillary: 153 mg/dL — ABNORMAL HIGH (ref 70–99)
Glucose-Capillary: 173 mg/dL — ABNORMAL HIGH (ref 70–99)
Glucose-Capillary: 197 mg/dL — ABNORMAL HIGH (ref 70–99)
Glucose-Capillary: 209 mg/dL — ABNORMAL HIGH (ref 70–99)

## 2017-11-26 LAB — CBC
HCT: 35.9 % — ABNORMAL LOW (ref 39.0–52.0)
HEMOGLOBIN: 12.1 g/dL — AB (ref 13.0–17.0)
MCH: 31.8 pg (ref 26.0–34.0)
MCHC: 33.7 g/dL (ref 30.0–36.0)
MCV: 94.5 fL (ref 78.0–100.0)
Platelets: 78 10*3/uL — ABNORMAL LOW (ref 150–400)
RBC: 3.8 MIL/uL — AB (ref 4.22–5.81)
RDW: 14.9 % (ref 11.5–15.5)
WBC: 4 10*3/uL (ref 4.0–10.5)

## 2017-11-26 LAB — COMPREHENSIVE METABOLIC PANEL
ALK PHOS: 77 U/L (ref 38–126)
ALT: 18 U/L (ref 0–44)
AST: 27 U/L (ref 15–41)
Albumin: 2.6 g/dL — ABNORMAL LOW (ref 3.5–5.0)
Anion gap: 8 (ref 5–15)
BUN: 17 mg/dL (ref 8–23)
CALCIUM: 8.2 mg/dL — AB (ref 8.9–10.3)
CO2: 25 mmol/L (ref 22–32)
CREATININE: 1.07 mg/dL (ref 0.61–1.24)
Chloride: 107 mmol/L (ref 98–111)
GFR calc non Af Amer: >60 mL/min (ref >60)
GLUCOSE: 177 mg/dL — AB (ref 70–99)
Potassium: 4 mmol/L (ref 3.5–5.1)
SODIUM: 140 mmol/L (ref 135–145)
Total Bilirubin: 1.4 mg/dL — ABNORMAL HIGH (ref 0.3–1.2)
Total Protein: 5.9 g/dL — ABNORMAL LOW (ref 6.5–8.1)

## 2017-11-26 LAB — AMMONIA: Ammonia: 72 umol/L — ABNORMAL HIGH (ref 9–35)

## 2017-11-26 NOTE — Progress Notes (Signed)
CSW following for discharge plan. CSW confirmed with [facility] that they can offer a bed for the patient. CSW has received insurance authorization when patient is medically stable for discharge.  CSW to follow.  Laveda Abbe, [place] Clinical Social Worker [phone]

## 2017-11-26 NOTE — Progress Notes (Signed)
Progress Note    Keith [name]  QMG:[id number] DOB: [birth year]  DOA: 11/23/2017 PCP: Haywood Pao, MD    Brief Narrative:     Medical records reviewed and are as summarized below:  Keith [name] is an 77 y.o. male with medical history significant of diabetes mellitus type 2, cirrhosis with hepatocellular carcinoma, BPH, AV nodal block with pacemaker in place, obstructive sleep apnea on CPAP, peripheral neuropathy comes to the hospital for evaluation of weakness.  Patient states for the past 10 days he has been feeling very weak especially in his lower extremity.  Thinks most of his weakness is coming from his quadriceps and some achiness.  But over the course of last 4 days he has not been able to get out of bed much to get around.  Typically gets outpatient physical therapy.  Assessment/Plan:   Principal Problem:   Weakness Active Problems:   OA (osteoarthritis) of knee   DM2 (diabetes mellitus, type 2) (HCC)   GERD (gastroesophageal reflux disease)   Hypertension   Prostate hypertrophy   Other cirrhosis of liver (HCC)   OSA (obstructive sleep apnea)   Pacemaker  Generalized weakness especially bilateral lower extremity Urinary tract infection- culture with coag negative staph >100,000- suspect contaminent -CT of the head is negative for any acute infarct, shows old small right occipital infarct, old lacunar infarct.  Mild chronic small vessel ischemic changes. - PT/OT- SNF -blood cultures NGTD - Ammonia mildly elevated-- does not tolerate lactulose, started xifaxan -B12/TSH normal -CK normal  Diabetes Mellitus Type 2 -Insulin sliding scale -lantus  Obstructive sleep apnea -CPAP ordered at bedtime  History of congestive heart failure with preserved ejection fraction, appears euvolemic -stable  History of cirrhosis with hepatocellular carcinoma -Follows at [facility] gastroenterology -ammonia elevated at 60-- refuses lactulose-- trial of xifaxan     Peripheral neuropathy - Resume home Lyrica   obesity Body mass index is 45.28 kg/m.   Family Communication/Anticipated D/C date and plan/Code Status   DVT prophylaxis: scd Code Status: Full Code.  Family Communication: wife on phone 8/20-- was not able to reach this PM Disposition Plan: appears SNF bed in AM-- was not notified   Medical Consultants:    None.   Subjective:   No chest pain, no SOB  Objective:    Vitals:   11/26/17 0321 11/26/17 0849 11/26/17 1318 11/26/17 1628  BP: 116/72 108/61 120/62 125/79  Pulse: 75 68 69 67  Resp:  18 18   Temp: 99.4 F (37.4 C) 98.2 F (36.8 C)  (!) 97.5 F (36.4 C)  TempSrc: Oral Oral  Oral  SpO2: 95% 95% 97% 95%  Weight:      Height:        Intake/Output Summary (Last 24 hours) at 11/26/2017 1834 Last data filed at 11/26/2017 3267 Gross per 24 hour  Intake 240 ml  Output 1550 ml  Net -1310 ml   Filed Weights   11/23/17 1250 11/24/17 0500 11/25/17 0530  Weight: (!) 139.3 kg (!) 142.7 kg (!) 145.2 kg    Exam: In bed, NAD Pleasant/cooperative abd large but not firm nor tender Chronic skin changes on LE   Data Reviewed:   I have personally reviewed following labs and imaging studies:  Labs: Labs show the following:   Basic Metabolic Panel: Recent Labs  Lab 11/23/17 1404 11/24/17 0555 11/26/17 0533  NA 139 140 140  K 4.1 3.6 4.0  CL 105 106 107  CO2 26 26 25  GLUCOSE 218* 135* 177*  BUN 11 15 17   CREATININE 1.10 1.15 1.07  CALCIUM 8.5* 7.8* 8.2*   GFR Estimated Creatinine Clearance: 86.6 mL/min (by C-G formula based on SCr of 1.07 mg/dL). Liver Function Tests: Recent Labs  Lab 11/23/17 1404 11/24/17 0555 11/26/17 0533  AST 32 24 27  ALT 22 16 18   ALKPHOS 89 74 77  BILITOT 2.9* 2.1* 1.4*  PROT 6.6 5.4* 5.9*  ALBUMIN 3.0* 2.5* 2.6*   Recent Labs  Lab 11/23/17 1404  LIPASE 23   Recent Labs  Lab 11/23/17 1921 11/26/17 0533  AMMONIA 60* 72*   Coagulation profile Recent Labs   Lab 11/23/17 1404  INR 1.09    CBC: Recent Labs  Lab 11/23/17 1404 11/23/17 1929 11/24/17 0555 11/26/17 0533  WBC 6.4  --  5.2 4.0  NEUTROABS 4.4  --   --   --   HGB 12.5*  --  11.5* 12.1*  HCT 37.4* 37.6 34.4* 35.9*  MCV 96.9  --  96.1 94.5  PLT 63*  --  58* 78*   Cardiac Enzymes: Recent Labs  Lab 11/23/17 1404  CKTOTAL 89   BNP (last 3 results) No results for input(s): PROBNP in the last 8760 hours. CBG: Recent Labs  Lab 11/25/17 1646 11/25/17 2159 11/26/17 0616 11/26/17 1131 11/26/17 1626  GLUCAP 151* 201* 153* 173* 197*   D-Dimer: No results for input(s): DDIMER in the last 72 hours. Hgb A1c: No results for input(s): HGBA1C in the last 72 hours. Lipid Profile: No results for input(s): CHOL, HDL, LDLCALC, TRIG, CHOLHDL, LDLDIRECT in the last 72 hours. Thyroid function studies: No results for input(s): TSH, T4TOTAL, T3FREE, THYROIDAB in the last 72 hours.  Invalid input(s): FREET3 Anemia work up: No results for input(s): VITAMINB12, FOLATE, FERRITIN, TIBC, IRON, RETICCTPCT in the last 72 hours. Sepsis Labs: Recent Labs  Lab 11/23/17 1404 11/24/17 0555 11/26/17 0533  WBC 6.4 5.2 4.0    Microbiology Recent Results (from the past 240 hour(s))  Urine Culture     Status: Abnormal   Collection Time: 11/23/17  3:10 PM  Result Value Ref Range Status   Specimen Description URINE, RANDOM  Final   Special Requests   Final    NONE Performed at [facility], [place] [address] [place], [place] [address]    Culture (A)  Final    >=100,000 COLONIES/mL STAPHYLOCOCCUS SPECIES (COAGULASE NEGATIVE)   Report Status 11/25/2017 FINAL  Final   Organism ID, Bacteria STAPHYLOCOCCUS SPECIES (COAGULASE NEGATIVE) (A)  Final      Susceptibility   Staphylococcus species (coagulase negative) - MIC*    CIPROFLOXACIN <=0.5 SENSITIVE Sensitive     GENTAMICIN <=0.5 SENSITIVE Sensitive     NITROFURANTOIN <=16 SENSITIVE Sensitive     OXACILLIN >=4 RESISTANT Resistant      TETRACYCLINE 4 SENSITIVE Sensitive     VANCOMYCIN 1 SENSITIVE Sensitive     TRIMETH/SULFA <=10 SENSITIVE Sensitive     CLINDAMYCIN <=0.25 SENSITIVE Sensitive     RIFAMPIN <=0.5 SENSITIVE Sensitive     Inducible Clindamycin NEGATIVE Sensitive     * >=100,000 COLONIES/mL STAPHYLOCOCCUS SPECIES (COAGULASE NEGATIVE)  Culture, blood (single) w Reflex to ID Panel     Status: None (Preliminary result)   Collection Time: 11/24/17  4:57 PM  Result Value Ref Range Status   Specimen Description BLOOD LEFT HAND  Final   Special Requests   Final    BOTTLES DRAWN AEROBIC ONLY Blood Culture adequate volume   Culture  Final    NO GROWTH 2 DAYS Performed at [facility], [place] [address] [place], [place] [address]    Report Status PENDING  Incomplete  Culture, blood (Routine X 2) w Reflex to ID Panel     Status: None (Preliminary result)   Collection Time: 11/24/17  5:02 PM  Result Value Ref Range Status   Specimen Description BLOOD RIGHT ANTECUBITAL  Final   Special Requests   Final    BOTTLES DRAWN AEROBIC AND ANAEROBIC Blood Culture adequate volume   Culture   Final    NO GROWTH 2 DAYS Performed at [facility], [place] [address], [place], [place] [address]    Report Status PENDING  Incomplete    Procedures and diagnostic studies:  No results found.  Medications:   . amitriptyline  25 mg Oral QHS  . cholecalciferol  1,000 Units Oral Daily  . clopidogrel  75 mg Oral Daily  . finasteride  5 mg Oral Daily  . folic acid  1 mg Oral Daily  . insulin aspart  0-20 Units Subcutaneous TID WC  . insulin aspart  0-5 Units Subcutaneous QHS  . insulin glargine  40 Units Subcutaneous BID  . memantine  20 mg Oral QHS  . nadolol  10 mg Oral QHS  . pantoprazole  40 mg Oral Q1200  . pregabalin  300 mg Oral BID  . rifaximin  550 mg Oral BID  . spironolactone  25 mg Oral Daily  . tamsulosin  0.4 mg Oral QPC supper  . traMADol  50 mg Oral BID   Continuous Infusions:    LOS: 0 days    Geradine Girt  [facility]   *Please refer to [email or web address], password [email or web address] to get updated schedule on who will round on this patient, as hospitalists switch teams weekly. If 7PM-7AM, please contact night-coverage at [email or web address], password [email or web address] for any overnight needs.  11/26/2017, 6:34 PM

## 2017-11-26 NOTE — Progress Notes (Signed)
Nutrition Brief Note  Patient identified on the Malnutrition Screening Tool (MST) Report  Wt Readings from Last 15 Encounters:  11/25/17 (!) 145.2 kg  05/27/16 (!) 145.8 kg  05/13/16 (!) 144.9 kg  03/04/16 (!) 146.5 kg  06/06/14 136.1 kg  05/30/14 136.1 kg  05/09/14 136.1 kg  05/02/14 90.7 kg  03/29/14 (!) 138.3 kg  12/21/12 130 kg  12/15/12 124.7 kg  12/03/12 124.7 kg  07/09/12 122.5 kg   Keith [name] is a 77 y.o. male with medical history significant of diabetes mellitus type 2, cirrhosis with hepatocellular carcinoma, BPH, AV nodal block with pacemaker in place, obstructive sleep apnea on CPAP, peripheral neuropathy comes to the hospital for evaluation of weakness.   Pt admitted with generalized weakness with concern for CVA.   Spoke with pt at bedside, who was in good spirits today. He reports intentional decreased appetite. He usually consumes 2 meals per day- Breakfast: eggs and cereal or sandwich, Dinner: meat, starch, and vegetable. Pt endorses some self-feeding difficulty related to tremors. Pt consumed 100% of breakfast and all of his lunch except salad greens.   He denies any weight loss, despite trying. Noted weight increase, likely related to edema.   Nutrition-Focused physical exam completed. Findings are no fat depletion, no muscle depletion, and moderate edema.   Last Hgb A1c: 6.6 (11/23/17). PTA DM medications are 2-10 units insulin aspart TID and 50 units insulin glargine daily.   Labs reviewed: CBGS: 151-201 (inpatient orders for glycemic control are 0-20 units insulin aspart TID with meals, 0-5 units insulin aspart q HS, and 40 units insulin glargine daily).   Body mass index is 45.28 kg/m. Patient meets criteria for extreme obesity, class III based on current BMI.   Current diet order is Heart Healthy/ Carb Modified, patient is consuming approximately 100% of meals at this time. Labs and medications reviewed.   No nutrition interventions warranted at  this time. If nutrition issues arise, please consult RD.   Keith [name] A. Jimmye Norman, RD, LDN, CDE Pager: [phone] After hours Pager: [phone]

## 2017-11-27 ENCOUNTER — Ambulatory Visit: Payer: Medicare Other | Admitting: Physical Therapy

## 2017-11-27 DIAGNOSIS — I10 Essential (primary) hypertension: Secondary | ICD-10-CM | POA: Diagnosis not present

## 2017-11-27 DIAGNOSIS — R531 Weakness: Secondary | ICD-10-CM

## 2017-11-27 DIAGNOSIS — Z9221 Personal history of antineoplastic chemotherapy: Secondary | ICD-10-CM | POA: Diagnosis not present

## 2017-11-27 DIAGNOSIS — I4439 Other atrioventricular block: Secondary | ICD-10-CM | POA: Diagnosis not present

## 2017-11-27 DIAGNOSIS — K219 Gastro-esophageal reflux disease without esophagitis: Secondary | ICD-10-CM | POA: Diagnosis not present

## 2017-11-27 DIAGNOSIS — F0390 Unspecified dementia without behavioral disturbance: Secondary | ICD-10-CM | POA: Diagnosis not present

## 2017-11-27 DIAGNOSIS — Z794 Long term (current) use of insulin: Secondary | ICD-10-CM | POA: Diagnosis not present

## 2017-11-27 DIAGNOSIS — C220 Liver cell carcinoma: Secondary | ICD-10-CM | POA: Diagnosis not present

## 2017-11-27 DIAGNOSIS — D7589 Other specified diseases of blood and blood-forming organs: Secondary | ICD-10-CM | POA: Diagnosis not present

## 2017-11-27 DIAGNOSIS — E1100 Type 2 diabetes mellitus with hyperosmolarity without nonketotic hyperglycemic-hyperosmolar coma (NKHHC): Secondary | ICD-10-CM | POA: Diagnosis not present

## 2017-11-27 DIAGNOSIS — F329 Major depressive disorder, single episode, unspecified: Secondary | ICD-10-CM | POA: Diagnosis not present

## 2017-11-27 DIAGNOSIS — F4320 Adjustment disorder, unspecified: Secondary | ICD-10-CM | POA: Diagnosis not present

## 2017-11-27 DIAGNOSIS — R262 Difficulty in walking, not elsewhere classified: Secondary | ICD-10-CM | POA: Diagnosis not present

## 2017-11-27 DIAGNOSIS — M6281 Muscle weakness (generalized): Secondary | ICD-10-CM | POA: Diagnosis not present

## 2017-11-27 DIAGNOSIS — Z950 Presence of cardiac pacemaker: Secondary | ICD-10-CM | POA: Diagnosis not present

## 2017-11-27 DIAGNOSIS — D696 Thrombocytopenia, unspecified: Secondary | ICD-10-CM | POA: Diagnosis not present

## 2017-11-27 DIAGNOSIS — Z7901 Long term (current) use of anticoagulants: Secondary | ICD-10-CM | POA: Diagnosis not present

## 2017-11-27 DIAGNOSIS — R5381 Other malaise: Secondary | ICD-10-CM | POA: Diagnosis not present

## 2017-11-27 DIAGNOSIS — E669 Obesity, unspecified: Secondary | ICD-10-CM | POA: Diagnosis not present

## 2017-11-27 DIAGNOSIS — Z87891 Personal history of nicotine dependence: Secondary | ICD-10-CM | POA: Diagnosis not present

## 2017-11-27 DIAGNOSIS — K766 Portal hypertension: Secondary | ICD-10-CM | POA: Diagnosis not present

## 2017-11-27 DIAGNOSIS — N390 Urinary tract infection, site not specified: Secondary | ICD-10-CM | POA: Diagnosis not present

## 2017-11-27 DIAGNOSIS — R279 Unspecified lack of coordination: Secondary | ICD-10-CM | POA: Diagnosis not present

## 2017-11-27 DIAGNOSIS — Z79899 Other long term (current) drug therapy: Secondary | ICD-10-CM | POA: Diagnosis not present

## 2017-11-27 DIAGNOSIS — I509 Heart failure, unspecified: Secondary | ICD-10-CM | POA: Diagnosis not present

## 2017-11-27 DIAGNOSIS — C8298 Follicular lymphoma, unspecified, lymph nodes of multiple sites: Secondary | ICD-10-CM | POA: Diagnosis not present

## 2017-11-27 DIAGNOSIS — F419 Anxiety disorder, unspecified: Secondary | ICD-10-CM | POA: Diagnosis not present

## 2017-11-27 DIAGNOSIS — E7220 Disorder of urea cycle metabolism, unspecified: Secondary | ICD-10-CM | POA: Diagnosis not present

## 2017-11-27 DIAGNOSIS — G4733 Obstructive sleep apnea (adult) (pediatric): Secondary | ICD-10-CM | POA: Diagnosis not present

## 2017-11-27 DIAGNOSIS — E1142 Type 2 diabetes mellitus with diabetic polyneuropathy: Secondary | ICD-10-CM | POA: Diagnosis not present

## 2017-11-27 DIAGNOSIS — Z743 Need for continuous supervision: Secondary | ICD-10-CM | POA: Diagnosis not present

## 2017-11-27 DIAGNOSIS — K7581 Nonalcoholic steatohepatitis (NASH): Secondary | ICD-10-CM | POA: Diagnosis not present

## 2017-11-27 DIAGNOSIS — Z6841 Body Mass Index (BMI) 40.0 and over, adult: Secondary | ICD-10-CM | POA: Diagnosis not present

## 2017-11-27 DIAGNOSIS — B958 Unspecified staphylococcus as the cause of diseases classified elsewhere: Secondary | ICD-10-CM | POA: Diagnosis not present

## 2017-11-27 DIAGNOSIS — G909 Disorder of the autonomic nervous system, unspecified: Secondary | ICD-10-CM | POA: Diagnosis not present

## 2017-11-27 DIAGNOSIS — R7989 Other specified abnormal findings of blood chemistry: Secondary | ICD-10-CM | POA: Diagnosis not present

## 2017-11-27 DIAGNOSIS — M1710 Unilateral primary osteoarthritis, unspecified knee: Secondary | ICD-10-CM | POA: Diagnosis not present

## 2017-11-27 DIAGNOSIS — I69398 Other sequelae of cerebral infarction: Secondary | ICD-10-CM | POA: Diagnosis not present

## 2017-11-27 DIAGNOSIS — K7030 Alcoholic cirrhosis of liver without ascites: Secondary | ICD-10-CM | POA: Diagnosis not present

## 2017-11-27 DIAGNOSIS — R488 Other symbolic dysfunctions: Secondary | ICD-10-CM | POA: Diagnosis not present

## 2017-11-27 DIAGNOSIS — K7460 Unspecified cirrhosis of liver: Secondary | ICD-10-CM | POA: Diagnosis not present

## 2017-11-27 DIAGNOSIS — N400 Enlarged prostate without lower urinary tract symptoms: Secondary | ICD-10-CM | POA: Diagnosis not present

## 2017-11-27 DIAGNOSIS — E119 Type 2 diabetes mellitus without complications: Secondary | ICD-10-CM | POA: Diagnosis not present

## 2017-11-27 LAB — GLUCOSE, CAPILLARY
Glucose-Capillary: 248 mg/dL — ABNORMAL HIGH (ref 70–99)
Glucose-Capillary: 253 mg/dL — ABNORMAL HIGH (ref 70–99)

## 2017-11-27 MED ORDER — TRAMADOL HCL 50 MG PO TABS: 50.0000 mg | ORAL_TABLET | Freq: Four times a day (QID) | ORAL | 0 refills | Status: DC | PRN

## 2017-11-27 MED ORDER — LACTULOSE 10 GM/15ML PO SOLN: 15.0000 g | Freq: Three times a day (TID) | ORAL | 0 refills | Status: DC

## 2017-11-27 MED ORDER — RIFAXIMIN 550 MG PO TABS: 550.0000 mg | ORAL_TABLET | Freq: Two times a day (BID) | ORAL | 0 refills | Status: DC

## 2017-11-27 NOTE — Clinical Social Work Placement (Signed)
Nurse to call report to [phone], Room 109      CLINICAL SOCIAL WORK PLACEMENT  NOTE  Date:  11/27/2017  Patient Details  Name: Keith [name] MRN: [id number] Date of Birth: 1940/09/29  Clinical Social Work is seeking post-discharge placement for this patient at the [place] level of care (*CSW will initial, date and re-position this form in  chart as items are completed):  Yes   Patient/family provided with [facility] Work Department's list of facilities offering this level of care within the geographic area requested by the patient (or if unable, by the patient's family).  Yes   Patient/family informed of their freedom to choose among providers that offer the needed level of care, that participate in Medicare, Medicaid or managed care program needed by the patient, have an available bed and are willing to accept the patient.  Yes   Patient/family informed of [facility]'s ownership interest in [facility] and [facility], as well as of the fact that they are under no obligation to receive care at these facilities.  PASRR submitted to EDS on 11/25/17     PASRR number received on 11/25/17     Existing PASRR number confirmed on       FL2 transmitted to all facilities in geographic area requested by pt/family on 11/25/17     FL2 transmitted to all facilities within larger geographic area on       Patient informed that his/her managed care company has contracts with or will negotiate with certain facilities, including the following:        Yes   Patient/family informed of bed offers received.  Patient chooses bed at [facility]     Physician recommends and patient chooses bed at      Patient to be transferred to [facility] on 11/27/17.  Patient to be transferred to facility by [facility]     Patient family notified on 11/27/17 of transfer.  Name of family member notified:  Edd Fabian     PHYSICIAN       Additional Comment:    _______________________________________________ Geralynn Ochs, LCSW 11/27/2017, 12:19 PM

## 2017-11-27 NOTE — Progress Notes (Signed)
Physical Therapy Treatment Patient Details Name: Keith [name] MRN: [id number] DOB: May 20, 1940 Today's Date: 11/27/2017    History of Present Illness Pt is a 77 year old man admitted 11/23/17 with generalized weakness and inability to get OOB. PMH: DM, cirrhosis with hepatocellular carcinoma, pacemaker, OSA, CVA, HTN, peripheral neuropathy.    PT Comments    Patient doing well today - motivated to work with PT this morning. Good progression towards PT goals this session with good ability to progress functional mobility with RW. Education on safety and appropriate use of device. Does continue to require physical assist for bed mobility, transfers, and gait due to weakness and reduced sensation at B LE.   Follow Up Recommendations  SNF     Equipment Recommendations  None recommended by PT    Recommendations for Other Services       Precautions / Restrictions Precautions Precautions: Fall Restrictions Weight Bearing Restrictions: No    Mobility  Bed Mobility Overal bed mobility: Needs Assistance Bed Mobility: Supine to Sit     Supine to sit: Max assist     General bed mobility comments: assist to lift trunk from his side, then to scoot hips out to EOB with bed pad  Transfers Overall transfer level: Needs assistance Equipment used: Rolling walker (2 wheeled) Transfers: Sit to/from Stand Sit to Stand: Min assist;+2 physical assistance         General transfer comment: vc for safe hand placement, Pt required assist for boost, and cues for safe BLE positioning prior to stand  Ambulation/Gait Ambulation/Gait assistance: Min assist Gait Distance (Feet): 15 Feet Assistive device: Rolling walker (2 wheeled) Gait Pattern/deviations: Step-to pattern;Decreased stride length;Shuffle;Trunk flexed Gait velocity: decreased   General Gait Details: use of RW to promote safety; patient reporting reduced sensation of LE requiring verbal and tactile cueing for LE  progression   Stairs             Wheelchair Mobility    Modified Rankin (Stroke Patients Only)       Balance Overall balance assessment: Needs assistance Sitting-balance support: Feet supported Sitting balance-Leahy Scale: Fair     Standing balance support: Bilateral upper extremity supported;During functional activity Standing balance-Leahy Scale: Poor Standing balance comment: reliant on RW for balance                            Cognition Arousal/Alertness: Awake/alert Behavior During Therapy: WFL for tasks assessed/performed Overall Cognitive Status: Impaired/Different from baseline Area of Impairment: Awareness;Safety/judgement;Problem solving                         Safety/Judgement: Decreased awareness of safety;Decreased awareness of deficits Awareness: Intellectual Problem Solving: Slow processing;Difficulty sequencing;Requires verbal cues        Exercises      General Comments        Pertinent Vitals/Pain Pain Assessment: No/denies pain    Home Living                      Prior Function            PT Goals (current goals can now be found in the care plan section) Acute Rehab PT Goals Patient Stated Goal: to get to walk better PT Goal Formulation: With patient Time For Goal Achievement: 12/08/17 Potential to Achieve Goals: Good Progress towards PT goals: Progressing toward goals    Frequency    Min 2X/week  PT Plan Current plan remains appropriate    Co-evaluation              AM-PAC PT "6 Clicks" Daily Activity  Outcome Measure  Difficulty turning over in bed (including adjusting bedclothes, sheets and blankets)?: A Lot Difficulty moving from lying on back to sitting on the side of the bed? : Unable Difficulty sitting down on and standing up from a chair with arms (e.g., wheelchair, bedside commode, etc,.)?: Unable Help needed moving to and from a bed to chair (including a  wheelchair)?: A Lot Help needed walking in hospital room?: A Little Help needed climbing 3-5 steps with a railing? : A Lot 6 Click Score: 11    End of Session Equipment Utilized During Treatment: Gait belt Activity Tolerance: Patient tolerated treatment well Patient left: in chair;with call bell/phone within reach;with chair alarm set Nurse Communication: Mobility status PT Visit Diagnosis: Unsteadiness on feet (R26.81);Muscle weakness (generalized) (M62.81);Difficulty in walking, not elsewhere classified (R26.2)     Time: 9144-4584 PT Time Calculation (min) (ACUTE ONLY): 21 min  Charges:  $Gait Training: 8-22 mins                     Lanney Gins, PT, DPT 11/27/17 11:39 AM Pager: [phone]

## 2017-11-27 NOTE — Progress Notes (Signed)
Pt discharged from unit, transferred by [facility] IV and tele removed. Called report to RN at [place], Contractor. No new questions or concerns at this time.

## 2017-11-27 NOTE — Discharge Summary (Signed)
Physician Discharge Summary  STRUMMER CANIPE MBW:[id number] DOB: 04-Jun-1940 DOA: 11/23/2017  PCP: Haywood Pao, MD  Admit date: 11/23/2017 Discharge date: 11/27/2017  Admitted From: Home Disposition:  Home  Discharge Condition:Stable CODE STATUS:FULL Diet recommendation: Heart Healthy  Brief/Interim Summary:  Keith [name] is an 77 y.o. male with medical history significant ofdiabetes mellitus type 2, cirrhosis with hepatocellular carcinoma, BPH, AV nodal block with pacemaker in place, obstructive sleep apnea on CPAP, peripheral neuropathy comes to the hospital for evaluation of weakness. Patient states for the past 10 days he has been feeling very weak especially in his lower extremity. Thinks most of his weakness is coming from his quadriceps and some achiness. Over the course of last 4 days he has not been able to get out of bed much to get around. Typically gets outpatient physical therapy. Patient was advised by PT/OT who recommended to go to rehab on discharge.  Patient has history of liver cirrhosis with diverticular carcinoma and follows with gastric close at [facility].  His ammonia was found to be elevated but he is alert and oriented.  Patient is medically stable for discharge discussion present today.  Following problems were addressed during his hospitalization:  Generalized weakness especially bilateral lower extremity- -CT of the head is negative for any acute infarct, shows old small right occipital infarct, old lacunar infarct. Mild chronic small vessel ischemic changes. - PT/OT- SNF -blood cultures NGTD -B12/TSH normal -CK normal  Elevated ammonia- Secondary to liver cirrhosis.  Started on lactulose but he says he cannot tolerate.  We have put him on both lactulose and rifaximin because he may not be willing to take lactulose.  Please check ammonia level and 3 days.  He should follow-up with gastroenterology as an outpatient at [facility].  He is currently not  oriented x4.  He needs to to have 2-3 loose bowel movements every day.  Urinary tract infection- culture with coag negative staph >100,000- suspect contaminent  Diabetes Mellitus Type 2 -Resume home regimen  Obstructive sleep apnea -CPAP ordered at bedtime here  History of congestive heart failure with preserved ejection fraction, appears euvolemic -stable  History of cirrhosis with hepatocellular carcinoma -Follows at [facility] gastroenterology  Peripheral neuropathy - Resume home Lyrica   obesity Body mass index is 45.28 kg/m.     Discharge Diagnoses:  Principal Problem:   Weakness Active Problems:   OA (osteoarthritis) of knee   DM2 (diabetes mellitus, type 2) (HCC)   GERD (gastroesophageal reflux disease)   Hypertension   Prostate hypertrophy   Other cirrhosis of liver (HCC)   OSA (obstructive sleep apnea)   Pacemaker    Discharge Instructions  Discharge Instructions    Diet - low sodium heart healthy   Complete by:  As directed    Discharge instructions   Complete by:  As directed    1) Check ammonia level in 3 days. Take lactulose and rifaximin.  You need to have 2-3 loose stools a day.Do CBC and BMP test in a week. 2)Please follow up with gastroenterology at [facility] in 1 to 2 weeks.   Increase activity slowly   Complete by:  As directed      Allergies as of 11/27/2017      Reactions   Fentanyl Itching, Rash   Iodine Rash   Merbromin Rash   Other Rash   OPIODS OPIODS      Medication List    STOP taking these medications   oxyCODONE 10 mg 12 hr tablet Commonly  known as:  OXYCONTIN   oxyCODONE 5 MG immediate release tablet Commonly known as:  Oxy IR/ROXICODONE     TAKE these medications   albuterol 108 (90 Base) MCG/ACT inhaler Commonly known as:  PROVENTIL HFA;VENTOLIN HFA Inhale 1-2 puffs into the lungs every 6 (six) hours as needed for wheezing or shortness of breath.   amitriptyline 25 MG tablet Commonly known as:  ELAVIL Take 25-50  mg by mouth at bedtime.   bisacodyl 10 MG suppository Commonly known as:  DULCOLAX Place 1 suppository (10 mg total) rectally daily as needed.   cholecalciferol 1000 units tablet Commonly known as:  VITAMIN D Take 1,000 Units by mouth daily.   clopidogrel 75 MG tablet Commonly known as:  PLAVIX Take 75 mg by mouth daily.   diphenhydrAMINE 12.5 MG/5ML elixir Commonly known as:  BENADRYL Take 5-10 mLs (12.5-25 mg total) by mouth every 4 (four) hours as needed for itching.   DSS 100 MG Caps Take 100 mg by mouth 2 (two) times daily.   finasteride 5 MG tablet Commonly known as:  PROSCAR Take 5 mg by mouth daily.   folic acid 1 MG tablet Commonly known as:  FOLVITE Take 1 mg by mouth daily.   furosemide 40 MG tablet Commonly known as:  LASIX Take 40 mg by mouth every morning.   hydrocerin Crea Apply 1 application topically daily as needed (dry skin).   insulin aspart 100 UNIT/ML injection Commonly known as:  novoLOG Inject 5-30 Units into the skin 3 (three) times daily before meals. Per sliding scale.   insulin glargine 100 UNIT/ML injection Commonly known as:  LANTUS Inject 40 Units into the skin 2 (two) times daily.   iron polysaccharides 150 MG capsule Commonly known as:  NIFEREX Take 1 capsule (150 mg total) by mouth 2 (two) times daily.   lactulose 10 GM/15ML solution Commonly known as:  CHRONULAC Take 22.5 mLs (15 g total) by mouth 3 (three) times daily.   memantine 10 MG tablet Commonly known as:  NAMENDA Take 20 mg by mouth at bedtime.   methocarbamol 500 MG tablet Commonly known as:  ROBAXIN Take 1 tablet (500 mg total) by mouth every 6 (six) hours as needed.   metoCLOPramide 5 MG tablet Commonly known as:  REGLAN Take 1-2 tablets (5-10 mg total) by mouth every 8 (eight) hours as needed (if ondansetron (ZOFRAN) ineffective.).   nadolol 20 MG tablet Commonly known as:  CORGARD Take 10 mg by mouth at bedtime.   ondansetron 4 MG tablet Commonly  known as:  ZOFRAN Take 1 tablet (4 mg total) by mouth every 6 (six) hours as needed for nausea.   pantoprazole 40 MG tablet Commonly known as:  PROTONIX Take 40 mg by mouth daily.   polyethylene glycol packet Commonly known as:  MIRALAX / GLYCOLAX Take 17 g by mouth daily as needed.   polyvinyl alcohol 1.4 % ophthalmic solution Commonly known as:  LIQUIFILM TEARS Place 1 drop into both eyes 2 (two) times daily as needed (dry eyes).   pregabalin 300 MG capsule Commonly known as:  LYRICA Take 300 mg by mouth 2 (two) times daily.   rifaximin 550 MG Tabs tablet Commonly known as:  XIFAXAN Take 1 tablet (550 mg total) by mouth 2 (two) times daily.   silver sulfADIAZINE 1 % cream Commonly known as:  SILVADENE Apply topically daily. Apply to the right toe daily and cover with a dry dressing.   silver sulfADIAZINE 1 % cream Commonly known  as:  SILVADENE Apply topically daily. Apply to right toe daily and cover with a dry dressing daily.   silver sulfADIAZINE 1 % cream Commonly known as:  SILVADENE Apply topically daily.   simvastatin 20 MG tablet Commonly known as:  ZOCOR Take 10 mg by mouth every evening.   spironolactone 25 MG tablet Commonly known as:  ALDACTONE Take 25 mg by mouth daily.   tamsulosin 0.4 MG Caps capsule Commonly known as:  FLOMAX Take 0.4 mg by mouth daily after supper.   traMADol 50 MG tablet Commonly known as:  ULTRAM Take 1 tablet (50 mg total) by mouth every 6 (six) hours as needed (mild pain). What changed:  how much to take   warfarin 5 MG tablet Commonly known as:  COUMADIN Take 1.5-2 tablets (7.5-10 mg total) by mouth daily. Take Coumadin for three weeks for postoperative protocol and then the patient may resume their previous Coumadin home regimen.  The dose may need to be adjusted based upon the INR.  Please follow the INR and titrate Coumadin dose for a therapeutic range between 2.0 and 3.0 INR.  After three weeks of Coumadin, the patient  may resume their previous Coumadin home regimen and their Plavix.       Contact information for follow-up providers    Tisovec, Fransico Him, MD. Schedule an appointment as soon as possible for a visit in 1 week(s).   Specialty:  Internal Medicine Contact information: [place] [address] [phone]            Contact information for after-discharge care    Destination    [facility] Preferred SNF .   Service:  Skilled Nursing Contact information: [place] [phone]                 Allergies  Allergen Reactions  . Fentanyl Itching and Rash  . Iodine Rash  . Merbromin Rash  . Other Rash    OPIODS OPIODS    Consultations:  None   Procedures/Studies: Dg Chest 2 View  Result Date: 11/23/2017 CLINICAL DATA:  Weakness EXAM: CHEST - 2 VIEW COMPARISON:  12/03/2012 FINDINGS: Cardiac enlargement. Negative for heart failure. Dual lead pacemaker has been placed since the prior study with leads in the right atrium and right ventricle region. Atherosclerotic aortic arch Negative for heart failure. Negative for pneumonia or effusion. Mild atelectasis in the bases. IMPRESSION: Cardiac enlargement.  Mild bibasilar atelectasis. Dual lead pacemaker Electronically Signed   By: Franchot Gallo M.D.   On: 11/23/2017 14:43   Ct Head Wo Contrast  Result Date: 11/23/2017 CLINICAL DATA:  Ataxia, suspect stroke. History of hypertension, diabetes, liver cancer. EXAM: CT HEAD WITHOUT CONTRAST TECHNIQUE: Contiguous axial images were obtained from the base of the skull through the vertex without intravenous contrast. COMPARISON:  CT HEAD June 08, 2012. FINDINGS: BRAIN: No intraparenchymal hemorrhage, mass effect nor midline shift. The ventricles and sulci are normal for age. Old bilateral basal ganglia and LEFT thalamus lacunar infarct, possible RIGHT old thalamus lacunar infarct. Old small RIGHT occipital lobe  infarct. Patchy supratentorial white matter hypodensities within normal range for patient's age, though non-specific are most compatible with chronic small vessel ischemic disease. No acute large vascular territory infarcts. No abnormal extra-axial fluid collections. Basal cisterns are patent. VASCULAR: Moderate to severe calcific atherosclerosis of the carotid siphons. Unchanged focally hypodense RIGHT transverse sinus, potential arachnoid granulation. SKULL: No skull fracture. No significant scalp soft tissue  swelling. SINUSES/ORBITS: Trace paranasal sinus mucosal thickening. Perforated nasal septum. Mastoid air cells are well aerated.The included ocular globes and orbital contents are non-suspicious. Status post bilateral ocular lens implants. OTHER: Patient is edentulous. IMPRESSION: 1. No acute intracranial process. 2. Old small RIGHT occipital lobe infarct.  Old lacunar infarcts. 3. Mild chronic small vessel ischemic changes. Electronically Signed   By: Elon Alas M.D.   On: 11/23/2017 16:03       Subjective: Patient seen and examined the bedside this morning.  Remains comfortable.  Alert and oriented.  Hesitant to take lactulose.  Stable for discharge to rehab today.  Discharge Exam: Vitals:   11/27/17 0353 11/27/17 0838  BP: 114/65 127/69  Pulse: 66 67  Resp: 20 20  Temp: 97.9 F (36.6 C) 98.4 F (36.9 C)  SpO2: 95% 100%   Vitals:   11/26/17 1956 11/26/17 2356 11/27/17 0353 11/27/17 0838  BP: (!) 121/94 121/61 114/65 127/69  Pulse: 70 72 66 67  Resp: 20 20 20 20   Temp: 98.3 F (36.8 C) 98.4 F (36.9 C) 97.9 F (36.6 C) 98.4 F (36.9 C)  TempSrc: Oral Oral Oral Oral  SpO2: 99% 96% 95% 100%  Weight:   (!) 144.2 kg   Height:        General: Pt is alert, awake, not in acute distress,obese Cardiovascular: RRR, S1/S2 +, no rubs, no gallops Respiratory: CTA bilaterally, no wheezing, no rhonchi Abdominal: Soft, NT, bowel sounds + Extremities: no edema, no  cyanosis    The results of significant diagnostics from this hospitalization (including imaging, microbiology, ancillary and laboratory) are listed below for reference.     Microbiology: Recent Results (from the past 240 hour(s))  Urine Culture     Status: Abnormal   Collection Time: 11/23/17  3:10 PM  Result Value Ref Range Status   Specimen Description URINE, RANDOM  Final   Special Requests   Final    NONE Performed at [facility], [address] [place], [place] [address]    Culture (A)  Final    >=100,000 COLONIES/mL STAPHYLOCOCCUS SPECIES (COAGULASE NEGATIVE)   Report Status 11/25/2017 FINAL  Final   Organism ID, Bacteria STAPHYLOCOCCUS SPECIES (COAGULASE NEGATIVE) (A)  Final      Susceptibility   Staphylococcus species (coagulase negative) - MIC*    CIPROFLOXACIN <=0.5 SENSITIVE Sensitive     GENTAMICIN <=0.5 SENSITIVE Sensitive     NITROFURANTOIN <=16 SENSITIVE Sensitive     OXACILLIN >=4 RESISTANT Resistant     TETRACYCLINE 4 SENSITIVE Sensitive     VANCOMYCIN 1 SENSITIVE Sensitive     TRIMETH/SULFA <=10 SENSITIVE Sensitive     CLINDAMYCIN <=0.25 SENSITIVE Sensitive     RIFAMPIN <=0.5 SENSITIVE Sensitive     Inducible Clindamycin NEGATIVE Sensitive     * >=100,000 COLONIES/mL STAPHYLOCOCCUS SPECIES (COAGULASE NEGATIVE)  Culture, blood (single) w Reflex to ID Panel     Status: None (Preliminary result)   Collection Time: 11/24/17  4:57 PM  Result Value Ref Range Status   Specimen Description BLOOD LEFT HAND  Final   Special Requests   Final    BOTTLES DRAWN AEROBIC ONLY Blood Culture adequate volume   Culture   Final    NO GROWTH 2 DAYS Performed at [facility], [address], [place], [place] [address]    Report Status PENDING  Incomplete  Culture, blood (Routine X 2) w Reflex to ID Panel     Status: None (Preliminary result)   Collection  Time: 11/24/17  5:02 PM  Result Value Ref Range Status   Specimen Description BLOOD RIGHT ANTECUBITAL   Final   Special Requests   Final    BOTTLES DRAWN AEROBIC AND ANAEROBIC Blood Culture adequate volume   Culture   Final    NO GROWTH 2 DAYS Performed at [facility], [address], [place], [place] [address]    Report Status PENDING  Incomplete     Labs: BNP (last 3 results) No results for input(s): BNP in the last 8760 hours. Basic Metabolic Panel: Recent Labs  Lab 11/23/17 1404 11/24/17 0555 11/26/17 0533  NA 139 140 140  K 4.1 3.6 4.0  CL 105 106 107  CO2 26 26 25   GLUCOSE 218* 135* 177*  BUN 11 15 17   CREATININE 1.10 1.15 1.07  CALCIUM 8.5* 7.8* 8.2*   Liver Function Tests: Recent Labs  Lab 11/23/17 1404 11/24/17 0555 11/26/17 0533  AST 32 24 27  ALT 22 16 18   ALKPHOS 89 74 77  BILITOT 2.9* 2.1* 1.4*  PROT 6.6 5.4* 5.9*  ALBUMIN 3.0* 2.5* 2.6*   Recent Labs  Lab 11/23/17 1404  LIPASE 23   Recent Labs  Lab 11/23/17 1921 11/26/17 0533  AMMONIA 60* 72*   CBC: Recent Labs  Lab 11/23/17 1404 11/23/17 1929 11/24/17 0555 11/26/17 0533  WBC 6.4  --  5.2 4.0  NEUTROABS 4.4  --   --   --   HGB 12.5*  --  11.5* 12.1*  HCT 37.4* 37.6 34.4* 35.9*  MCV 96.9  --  96.1 94.5  PLT 63*  --  58* 78*   Cardiac Enzymes: Recent Labs  Lab 11/23/17 1404  CKTOTAL 89   BNP: Invalid input(s): POCBNP CBG: Recent Labs  Lab 11/26/17 0616 11/26/17 1131 11/26/17 1626 11/26/17 2139 11/27/17 0615  GLUCAP 153* 173* 197* 209* 248*   D-Dimer No results for input(s): DDIMER in the last 72 hours. Hgb A1c No results for input(s): HGBA1C in the last 72 hours. Lipid Profile No results for input(s): CHOL, HDL, LDLCALC, TRIG, CHOLHDL, LDLDIRECT in the last 72 hours. Thyroid function studies No results for input(s): TSH, T4TOTAL, T3FREE, THYROIDAB in the last 72 hours.  Invalid input(s): FREET3 Anemia work up No results for input(s): VITAMINB12, FOLATE, FERRITIN, TIBC, IRON, RETICCTPCT in the last 72 hours. Urinalysis    Component Value Date/Time    COLORURINE YELLOW 11/23/2017 1443   APPEARANCEUR HAZY (A) 11/23/2017 1443   LABSPEC 1.009 11/23/2017 1443   PHURINE 7.0 11/23/2017 1443   GLUCOSEU NEGATIVE 11/23/2017 1443   HGBUR MODERATE (A) 11/23/2017 1443   BILIRUBINUR NEGATIVE 11/23/2017 1443   KETONESUR NEGATIVE 11/23/2017 1443   PROTEINUR NEGATIVE 11/23/2017 1443   UROBILINOGEN 0.2 12/03/2012 1338   NITRITE POSITIVE (A) 11/23/2017 1443   LEUKOCYTESUR LARGE (A) 11/23/2017 1443   Sepsis Labs Invalid input(s): PROCALCITONIN,  WBC,  LACTICIDVEN Microbiology Recent Results (from the past 240 hour(s))  Urine Culture     Status: Abnormal   Collection Time: 11/23/17  3:10 PM  Result Value Ref Range Status   Specimen Description URINE, RANDOM  Final   Special Requests   Final    NONE Performed at [facility], [place] [address] [place],  [address]    Culture (A)  Final    >=100,000 COLONIES/mL STAPHYLOCOCCUS SPECIES (COAGULASE NEGATIVE)   Report Status 11/25/2017 FINAL  Final   Organism ID, Bacteria STAPHYLOCOCCUS SPECIES (COAGULASE NEGATIVE) (A)  Final  Susceptibility   Staphylococcus species (coagulase negative) - MIC*    CIPROFLOXACIN <=0.5 SENSITIVE Sensitive     GENTAMICIN <=0.5 SENSITIVE Sensitive     NITROFURANTOIN <=16 SENSITIVE Sensitive     OXACILLIN >=4 RESISTANT Resistant     TETRACYCLINE 4 SENSITIVE Sensitive     VANCOMYCIN 1 SENSITIVE Sensitive     TRIMETH/SULFA <=10 SENSITIVE Sensitive     CLINDAMYCIN <=0.25 SENSITIVE Sensitive     RIFAMPIN <=0.5 SENSITIVE Sensitive     Inducible Clindamycin NEGATIVE Sensitive     * >=100,000 COLONIES/mL STAPHYLOCOCCUS SPECIES (COAGULASE NEGATIVE)  Culture, blood (single) w Reflex to ID Panel     Status: None (Preliminary result)   Collection Time: 11/24/17  4:57 PM  Result Value Ref Range Status   Specimen Description BLOOD LEFT HAND  Final   Special Requests   Final    BOTTLES DRAWN AEROBIC ONLY Blood Culture adequate volume   Culture   Final    NO GROWTH  2 DAYS Performed at [facility], [place] [address], [place], [place] [address]    Report Status PENDING  Incomplete  Culture, blood (Routine X 2) w Reflex to ID Panel     Status: None (Preliminary result)   Collection Time: 11/24/17  5:02 PM  Result Value Ref Range Status   Specimen Description BLOOD RIGHT ANTECUBITAL  Final   Special Requests   Final    BOTTLES DRAWN AEROBIC AND ANAEROBIC Blood Culture adequate volume   Culture   Final    NO GROWTH 2 DAYS Performed at [facility], [place] [address], [place], [place] [address]    Report Status PENDING  Incomplete    Please note: You were cared for by a hospitalist during your hospital stay. Once you are discharged, your primary care physician will handle any further medical issues. Please note that NO REFILLS for any discharge medications will be authorized once you are discharged, as it is imperative that you return to your primary care physician (or establish a relationship with a primary care physician if you do not have one) for your post hospital discharge needs so that they can reassess your need for medications and monitor your lab values.    Time coordinating discharge: 40 minutes  SIGNED:   Shelly Coss, MD  [facility] 11/27/2017, 10:26 AM Pager [phone]  If 7PM-7AM, please contact night-coverage [email or web address] Password [email or web address]

## 2017-11-27 NOTE — Progress Notes (Signed)
Occupational Therapy Treatment Patient Details Name: Keith [name] MRN: [id number] DOB: 25-Jan-1941 Today's Date: 11/27/2017    History of present illness Pt is a 76 year old man admitted 11/23/17 with generalized weakness and inability to get OOB. PMH: DM, cirrhosis with hepatocellular carcinoma, pacemaker, OSA, CVA, HTN, peripheral neuropathy.   OT comments  Pt progressing towards OT goals this session. Pt required max A for bed mobility (trunk elevation) and mod A for UB bathing. Grooming was completed sitting EOB as Pt continues to require BUE support in standing and is unable to complete grooming at sink level. Pt completed dove-tail session with PT and eager to progress, increased care at SNF is required for Pt safety and for increased frequency of therapy to return Pt to PLOF.    Follow Up Recommendations  SNF;Supervision/Assistance - 24 hour    Equipment Recommendations  3 in 1 bedside commode    Recommendations for Other Services      Precautions / Restrictions Precautions Precautions: Fall Restrictions Weight Bearing Restrictions: No       Mobility Bed Mobility Overal bed mobility: Needs Assistance Bed Mobility: Supine to Sit     Supine to sit: Max assist     General bed mobility comments: assist to lift trunk from his side, then to scoot hips out to EOB with bed pad  Transfers Overall transfer level: Needs assistance Equipment used: Rolling walker (2 wheeled) Transfers: Sit to/from Stand Sit to Stand: Min assist;+2 physical assistance         General transfer comment: vc for safe hand placement, Pt required assist for boost, and cues for safe BLE positioning prior to stand    Balance Overall balance assessment: Needs assistance Sitting-balance support: Feet supported Sitting balance-Leahy Scale: Fair     Standing balance support: Bilateral upper extremity supported;During functional activity Standing balance-Leahy Scale: Poor Standing balance  comment: reliant on RW for balance                           ADL either performed or assessed with clinical judgement   ADL Overall ADL's : Needs assistance/impaired     Grooming: Wash/dry hands;Wash/dry face;Sitting;Min guard Grooming Details (indicate cue type and reason): EOB Upper Body Bathing: Moderate assistance;Sitting Upper Body Bathing Details (indicate cue type and reason): OT washed Pt's back             Toilet Transfer: Moderate assistance;Ambulation;RW;Requires wide/bariatric;+2 for safety/equipment Toilet Transfer Details (indicate cue type and reason): mod A for boost, min A once up with vc for step by step sequencing, Pt has VERY BAD neuropathy in feet and so requires cues as he cannot feel them Toileting- Clothing Manipulation and Hygiene: Maximal assistance;Sit to/from stand Toileting - Clothing Manipulation Details (indicate cue type and reason): standing with RW, assist for rear peri care     Functional mobility during ADLs: Minimal assistance;+2 for safety/equipment;Rolling walker;Cueing for sequencing;Cueing for safety       Vision       Perception     Praxis      Cognition Arousal/Alertness: Awake/alert Behavior During Therapy: WFL for tasks assessed/performed Overall Cognitive Status: Impaired/Different from baseline Area of Impairment: Awareness;Safety/judgement;Problem solving                         Safety/Judgement: Decreased awareness of safety;Decreased awareness of deficits Awareness: Intellectual Problem Solving: Slow processing;Difficulty sequencing;Requires verbal cues  Exercises     Shoulder Instructions       General Comments      Pertinent Vitals/ Pain       Pain Assessment: No/denies pain  Home Living                                          Prior Functioning/Environment              Frequency  Min 2X/week        Progress Toward Goals  OT Goals(current  goals can now be found in the care plan section)  Progress towards OT goals: Progressing toward goals  Acute Rehab OT Goals Patient Stated Goal: to get to walk better OT Goal Formulation: With patient Time For Goal Achievement: 12/08/17 Potential to Achieve Goals: Good  Plan Discharge plan remains appropriate;Frequency remains appropriate    Co-evaluation                 AM-PAC PT "6 Clicks" Daily Activity     Outcome Measure   Help from another person eating meals?: None Help from another person taking care of personal grooming?: A Little Help from another person toileting, which includes using toliet, bedpan, or urinal?: Total Help from another person bathing (including washing, rinsing, drying)?: A Lot Help from another person to put on and taking off regular upper body clothing?: A Little Help from another person to put on and taking off regular lower body clothing?: Total 6 Click Score: 14    End of Session Equipment Utilized During Treatment: Gait belt;Rolling walker  OT Visit Diagnosis: Unsteadiness on feet (R26.81);Other abnormalities of gait and mobility (R26.89);History of falling (Z91.81);Muscle weakness (generalized) (M62.81);Cognitive communication deficit (R41.841)   Activity Tolerance Patient tolerated treatment well   Patient Left in chair;with chair alarm set;with call bell/phone within reach   Nurse Communication Mobility status        Time: 3785-8850 OT Time Calculation (min): 17 min  Charges: OT General Charges $OT Visit: 1 Visit OT Treatments $Self Care/Home Management : 8-22 mins  Hulda Humphrey OTR/L [place] 11/27/2017, 10:33 AM

## 2017-11-28 ENCOUNTER — Non-Acute Institutional Stay (SKILLED_NURSING_FACILITY): Payer: Medicare Other | Admitting: Internal Medicine

## 2017-11-28 ENCOUNTER — Encounter: Payer: Self-pay | Admitting: Internal Medicine

## 2017-11-28 DIAGNOSIS — Z950 Presence of cardiac pacemaker: Secondary | ICD-10-CM

## 2017-11-28 DIAGNOSIS — R7989 Other specified abnormal findings of blood chemistry: Secondary | ICD-10-CM | POA: Diagnosis not present

## 2017-11-28 DIAGNOSIS — C220 Liver cell carcinoma: Secondary | ICD-10-CM | POA: Diagnosis not present

## 2017-11-28 DIAGNOSIS — K7030 Alcoholic cirrhosis of liver without ascites: Secondary | ICD-10-CM

## 2017-11-28 DIAGNOSIS — I2510 Atherosclerotic heart disease of native coronary artery without angina pectoris: Secondary | ICD-10-CM

## 2017-11-28 DIAGNOSIS — N400 Enlarged prostate without lower urinary tract symptoms: Secondary | ICD-10-CM

## 2017-11-28 DIAGNOSIS — G629 Polyneuropathy, unspecified: Secondary | ICD-10-CM

## 2017-11-28 DIAGNOSIS — R531 Weakness: Secondary | ICD-10-CM

## 2017-11-28 DIAGNOSIS — Z794 Long term (current) use of insulin: Secondary | ICD-10-CM

## 2017-11-28 DIAGNOSIS — G4733 Obstructive sleep apnea (adult) (pediatric): Secondary | ICD-10-CM

## 2017-11-28 DIAGNOSIS — Z7901 Long term (current) use of anticoagulants: Secondary | ICD-10-CM

## 2017-11-28 DIAGNOSIS — F0390 Unspecified dementia without behavioral disturbance: Secondary | ICD-10-CM

## 2017-11-28 DIAGNOSIS — E1142 Type 2 diabetes mellitus with diabetic polyneuropathy: Secondary | ICD-10-CM

## 2017-11-28 NOTE — Progress Notes (Signed)
:  Location:  [place] Room Number: 633H Place of Service:  SNF (31)  Dabney Schanz D. Sheppard Coil, MD  Patient Care Team: Tisovec, Fransico Him, MD as PCP - General (Internal Medicine)  Extended Emergency Contact Information Primary Emergency Contact: Gambuccini,[name] Address: [place], [place] [address] Johnnette Litter of [place] Phone: [phone] Mobile Phone: [phone] Relation: Spouse     Allergies: Fentanyl; Iodine; Merbromin; and Other  Chief Complaint  Patient presents with  . New Admit To SNF    Admit to [facility]    HPI: Patient is 77 y.o. male With diabetes mellitus type 2, cirrhosis with hepatocellular carcinoma BPH, AV nodal block with pacemaker in place, OSA on CPAP, peripheral neuropathy, who comes to [facility] with complaint of 10 days of feeling weak especially in his lower extremities..  Over the prior 4 days patient has been unable to get out of bed.  Patient was admitted to [facility] from 8/18-22 where he was treated with lactulose which he does not tolerate and refused to take where he was started on Xifaxan 1 secondary medication noncompliance.  CT of the head was negative for any acute infarct, it was felt that his lower extremity weakness was due to his hepatocellular carcinoma.  All else being stable patient was admitted to skilled nursing facility for OT/PT.  While at skilled nursing facility patient will be followed for [place] mellitus treated with insulin, dementia treated with Namenda, BPH treated with Proscar and Flomax.  Past Medical History:  Diagnosis Date  . Cirrhosis of liver ([place])   . Diabetes mellitus   . Hepatic cancer ([place])   . Prostate hypertrophy   . Stroke ([place])   . Varicose veins   . Weakness of both legs     Past Surgical History:  Procedure Laterality Date  . APPENDECTOMY    . EYE SURGERY     bilateral cataract   . left arm surgery     age 77  . left  hand surgery     tendons ripped on left hand  . LIVER RESECTION    . TONSILLECTOMY    . TOTAL KNEE ARTHROPLASTY Left 12/14/2012   Procedure: LEFT TOTAL KNEE ARTHROPLASTY;  Surgeon: Gearlean Alf, MD;  Location: [facility] ORS;  Service: Orthopedics;  Laterality: Left;    Allergies as of 11/28/2017      Reactions   Fentanyl Itching, Rash   Iodine Rash   Merbromin Rash   Other Rash   OPIODS OPIODS      Medication List        Accurate as of 11/28/17 11:03 AM. Always use your most recent med list.          albuterol 108 (90 Base) MCG/ACT inhaler Commonly known as:  PROVENTIL HFA;VENTOLIN HFA Inhale 1-2 puffs into the lungs every 6 (six) hours as needed for wheezing or shortness of breath.   amitriptyline 25 MG tablet Commonly known as:  ELAVIL Take 25-50 mg by mouth at bedtime.   bisacodyl 10 MG suppository Commonly known as:  DULCOLAX Place 1 suppository (10 mg total) rectally daily as needed.   cholecalciferol 1000 units tablet Commonly known as:  VITAMIN D Take 1,000 Units by mouth daily.   clopidogrel 75 MG tablet Commonly known as:  PLAVIX Take 75 mg by mouth daily.   diphenhydrAMINE 12.5 MG/5ML elixir Commonly known as:  BENADRYL Take 5-10  mLs (12.5-25 mg total) by mouth every 4 (four) hours as needed for itching.   DSS 100 MG Caps Take 100 mg by mouth 2 (two) times daily.   finasteride 5 MG tablet Commonly known as:  PROSCAR Take 5 mg by mouth daily.   folic acid 1 MG tablet Commonly known as:  FOLVITE Take 1 mg by mouth daily.   furosemide 40 MG tablet Commonly known as:  LASIX Take 40 mg by mouth every morning.   hydrocerin Crea Apply 1 application topically daily as needed (dry skin).   insulin aspart 100 UNIT/ML injection Commonly known as:  novoLOG Inject 5-30 Units into the skin 3 (three) times daily before meals. Per sliding scale.   insulin glargine 100 UNIT/ML injection Commonly known as:  LANTUS Inject 40 Units into the skin 2 (two) times  daily.   iron polysaccharides 150 MG capsule Commonly known as:  NIFEREX Take 1 capsule (150 mg total) by mouth 2 (two) times daily.   lactulose 10 GM/15ML solution Commonly known as:  CHRONULAC Take 22.5 mLs (15 g total) by mouth 3 (three) times daily.   memantine 10 MG tablet Commonly known as:  NAMENDA Take 20 mg by mouth at bedtime.   methocarbamol 500 MG tablet Commonly known as:  ROBAXIN Take 1 tablet (500 mg total) by mouth every 6 (six) hours as needed.   metoCLOPramide 5 MG tablet Commonly known as:  REGLAN Take 1-2 tablets (5-10 mg total) by mouth every 8 (eight) hours as needed (if ondansetron (ZOFRAN) ineffective.).   nadolol 20 MG tablet Commonly known as:  CORGARD Take 10 mg by mouth at bedtime.   ondansetron 4 MG tablet Commonly known as:  ZOFRAN Take 1 tablet (4 mg total) by mouth every 6 (six) hours as needed for nausea.   pantoprazole 40 MG tablet Commonly known as:  PROTONIX Take 40 mg by mouth daily.   polyethylene glycol packet Commonly known as:  MIRALAX / GLYCOLAX Take 17 g by mouth daily as needed.   polyvinyl alcohol 1.4 % ophthalmic solution Commonly known as:  LIQUIFILM TEARS Place 1 drop into both eyes 2 (two) times daily as needed (dry eyes).   pregabalin 300 MG capsule Commonly known as:  LYRICA Take 300 mg by mouth 2 (two) times daily.   rifaximin 550 MG Tabs tablet Commonly known as:  XIFAXAN Take 1 tablet (550 mg total) by mouth 2 (two) times daily.   silver sulfADIAZINE 1 % cream Commonly known as:  SILVADENE Apply topically daily. Apply to the right toe daily and cover with a dry dressing.   silver sulfADIAZINE 1 % cream Commonly known as:  SILVADENE Apply topically daily. Apply to right toe daily and cover with a dry dressing daily.   silver sulfADIAZINE 1 % cream Commonly known as:  SILVADENE Apply topically daily.   simvastatin 20 MG tablet Commonly known as:  ZOCOR Take 10 mg by mouth every evening.     spironolactone 25 MG tablet Commonly known as:  ALDACTONE Take 25 mg by mouth daily.   tamsulosin 0.4 MG Caps capsule Commonly known as:  FLOMAX Take 0.4 mg by mouth daily after supper.   traMADol 50 MG tablet Commonly known as:  ULTRAM Take 1 tablet (50 mg total) by mouth every 6 (six) hours as needed (mild pain).   warfarin 5 MG tablet Commonly known as:  COUMADIN Take 1.5-2 tablets (7.5-10 mg total) by mouth daily. Take Coumadin for three weeks for postoperative protocol and  then the patient may resume their previous Coumadin home regimen.  The dose may need to be adjusted based upon the INR.  Please follow the INR and titrate Coumadin dose for a therapeutic range between 2.0 and 3.0 INR.  After three weeks of Coumadin, the patient may resume their previous Coumadin home regimen and their Plavix.       No orders of the defined types were placed in this encounter.   Immunization History  Administered Date(s) Administered  . Tdap 07/09/2012    Social History   Tobacco Use  . Smoking status: Former Research scientist (life sciences)  . Smokeless tobacco: Former Systems developer    Quit date: 11/20/1983  Substance Use Topics  . Alcohol use: No    Alcohol/week: 0.0 standard drinks    Family history is   Family History  Problem Relation Age of Onset  . Stroke Father       Review of Systems  DATA OBTAINED: from patient, nurse GENERAL:  no fevers, fatigue, appetite changes SKIN: No itching, or rash EYES: No eye pain, redness, discharge EARS: No earache, tinnitus, change in hearing NOSE: No congestion, drainage or bleeding  MOUTH/THROAT: No mouth or tooth pain, No sore throat RESPIRATORY: No cough, wheezing, SOB CARDIAC: No chest pain, palpitations, lower extremity edema  GI: No abdominal pain, No N/V/D or constipation, No heartburn or reflux  GU: No dysuria, frequency or urgency, or incontinence  MUSCULOSKELETAL: No unrelieved bone/joint pain NEUROLOGIC: No headache, dizziness or focal  weakness PSYCHIATRIC: No c/o anxiety or sadness   Vitals:   11/28/17 1042  BP: (!) 145/85  Pulse: 76  Resp: 19  Temp: 97.8 F (36.6 C)  SpO2: 97%    SpO2 Readings from Last 1 Encounters:  11/28/17 97%   Body mass index is 45.48 kg/m.     Physical Exam  GENERAL APPEARANCE: Alert, conversant,  No acute distress.  SKIN: No diaphoresis rash HEAD: Normocephalic, atraumatic  EYES: Conjunctiva/lids clear. Pupils round, reactive. EOMs intact.  EARS: External exam WNL, canals clear. Hearing grossly normal.  NOSE: No deformity or discharge.  MOUTH/THROAT: Lips w/o lesions  RESPIRATORY: Breathing is even, unlabored. Lung sounds are clear   CARDIOVASCULAR: Heart RRR no murmurs, rubs or gallops. No peripheral edema.   GASTROINTESTINAL: Abdomen is soft, non-tender, not distended w/ normal bowel sounds. GENITOURINARY: Bladder non tender, not distended  MUSCULOSKELETAL: No abnormal joints or musculature NEUROLOGIC:  Cranial nerves 2-12 grossly intact. Moves all extremities  PSYCHIATRIC: Mood and affect appropriate to situation, no behavioral issues  Patient Active Problem List   Diagnosis Date Noted  . Weakness 11/23/2017  . Other cirrhosis of liver ([place]) 11/23/2017  . OSA (obstructive sleep apnea) 11/23/2017  . Pacemaker 11/23/2017  . Varicose veins of left lower extremity with complications 01/22/5101  . Knee pain, acute 01/26/2013  . Constipation 12/22/2012  . Neuropathy 12/22/2012  . Prostate hypertrophy 12/22/2012  . Acute blood loss anemia 12/22/2012  . Hyperlipidemia 12/22/2012  . DM2 (diabetes mellitus, type 2) ([place]) 12/17/2012  . GERD (gastroesophageal reflux disease) 12/17/2012  . Hypertension 12/17/2012  . Thrombocytopenia, unspecified ([place]) 12/17/2012  . Postoperative anemia due to acute blood loss 12/15/2012  . Hyponatremia 12/15/2012  . OA (osteoarthritis) of knee 12/14/2012      Labs reviewed: Basic Metabolic Panel:    Component Value Date/Time   NA  140 11/26/2017 0533   K 4.0 11/26/2017 0533   CL 107 11/26/2017 0533   CO2 25 11/26/2017 0533   GLUCOSE 177 (H) 11/26/2017 0533  BUN 17 11/26/2017 0533   CREATININE 1.07 11/26/2017 0533   CALCIUM 8.2 (L) 11/26/2017 0533   PROT 5.9 (L) 11/26/2017 0533   ALBUMIN 2.6 (L) 11/26/2017 0533   AST 27 11/26/2017 0533   ALT 18 11/26/2017 0533   ALKPHOS 77 11/26/2017 0533   BILITOT 1.4 (H) 11/26/2017 0533   GFRNONAA >60 11/26/2017 0533   GFRAA >60 11/26/2017 0533    Recent Labs    11/23/17 1404 11/24/17 0555 11/26/17 0533  NA 139 140 140  K 4.1 3.6 4.0  CL 105 106 107  CO2 26 26 25   GLUCOSE 218* 135* 177*  BUN 11 15 17   CREATININE 1.10 1.15 1.07  CALCIUM 8.5* 7.8* 8.2*   Liver Function Tests: Recent Labs    11/23/17 1404 11/24/17 0555 11/26/17 0533  AST 32 24 27  ALT 22 16 18   ALKPHOS 89 74 77  BILITOT 2.9* 2.1* 1.4*  PROT 6.6 5.4* 5.9*  ALBUMIN 3.0* 2.5* 2.6*   Recent Labs    11/23/17 1404  LIPASE 23   Recent Labs    11/23/17 1921 11/26/17 0533  AMMONIA 60* 72*   CBC: Recent Labs    11/23/17 1404 11/23/17 1929 11/24/17 0555 11/26/17 0533  WBC 6.4  --  5.2 4.0  NEUTROABS 4.4  --   --   --   HGB 12.5*  --  11.5* 12.1*  HCT 37.4* 37.6 34.4* 35.9*  MCV 96.9  --  96.1 94.5  PLT 63*  --  58* 78*   Lipid Recent Labs    11/23/17 1404  CHOL 149  HDL 37*  LDLCALC 96  TRIG 79    Cardiac Enzymes: Recent Labs    11/23/17 1404  CKTOTAL 89   BNP: No results for input(s): BNP in the last 8760 hours. No results found for: [facility] Lab Results  Component Value Date   HGBA1C 6.6 (H) 11/23/2017   Lab Results  Component Value Date   TSH 1.943 11/23/2017   Lab Results  Component Value Date   GBEEFEOF12 197 11/23/2017   No results found for: FOLATE No results found for: IRON, TIBC, FERRITIN  Imaging and Procedures obtained prior to SNF admission: Dg Chest 2 View  Result Date: 11/23/2017 CLINICAL DATA:  Weakness EXAM: CHEST - 2 VIEW  COMPARISON:  12/03/2012 FINDINGS: Cardiac enlargement. Negative for heart failure. Dual lead pacemaker has been placed since the prior study with leads in the right atrium and right ventricle region. Atherosclerotic aortic arch Negative for heart failure. Negative for pneumonia or effusion. Mild atelectasis in the bases. IMPRESSION: Cardiac enlargement.  Mild bibasilar atelectasis. Dual lead pacemaker Electronically Signed   By: Franchot Gallo M.D.   On: 11/23/2017 14:43   Ct Head Wo Contrast  Result Date: 11/23/2017 CLINICAL DATA:  Ataxia, suspect stroke. History of hypertension, diabetes, liver cancer. EXAM: CT HEAD WITHOUT CONTRAST TECHNIQUE: Contiguous axial images were obtained from the base of the skull through the vertex without intravenous contrast. COMPARISON:  CT HEAD June 08, 2012. FINDINGS: BRAIN: No intraparenchymal hemorrhage, mass effect nor midline shift. The ventricles and sulci are normal for age. Old bilateral basal ganglia and LEFT thalamus lacunar infarct, possible RIGHT old thalamus lacunar infarct. Old small RIGHT occipital lobe infarct. Patchy supratentorial white matter hypodensities within normal range for patient's age, though non-specific are most compatible with chronic small vessel ischemic disease. No acute large vascular territory infarcts. No abnormal extra-axial fluid collections. Basal cisterns are patent. VASCULAR: Moderate to severe calcific atherosclerosis  of the carotid siphons. Unchanged focally hypodense RIGHT transverse sinus, potential arachnoid granulation. SKULL: No skull fracture. No significant scalp soft tissue swelling. SINUSES/ORBITS: Trace paranasal sinus mucosal thickening. Perforated nasal septum. Mastoid air cells are well aerated.The included ocular globes and orbital contents are non-suspicious. Status post bilateral ocular lens implants. OTHER: Patient is edentulous. IMPRESSION: 1. No acute intracranial process. 2. Old small RIGHT occipital lobe infarct.   Old lacunar infarcts. 3. Mild chronic small vessel ischemic changes. Electronically Signed   By: Elon Alas M.D.   On: 11/23/2017 16:03     Not all labs, radiology exams or other studies done during hospitalization come through on my EPIC note; however they are reviewed by me.    Assessment and Plan  Generalized weakness/bilateral lower extremity weakness- CT of the head negative for acute infarct, shows old right occipital infarct, old lacunar infarct.  Mild chronic small vessel ischemic changes; blood cultures negative growth to date; B12/TSH normal/CK normal SNF -admitted for OT/PT  Elevated ammonia/ liver cirrhosis/ hepatocellular carcinoma - started on lactulose but patient says he cannot tolerate; patient was put on rifaximin because patient will be noncompliant with lactulose; patient is followed by gastroenterology as outpatient at [facility] SNF -lactulose 22.5 mils 3 times daily for 2-3 loose bowel movements daily; rifaximin 550 mg twice daily; will check ammonia level a day 5; have patient follow-up with gastroenterology at [facility]  Diabetes mellitus type 2 SNF -not stated as uncontrolled; continue Lantus 40 units twice daily and NovoLog sliding scale-appears to be a special one as it is 5 to 30 units into the skin 3 times daily before meals; patient is on statin  OSA on CPAP  Dementia SNF -appears small; continue Namenda 20 mg nightly  BPH SNF -appears controlled; continue Proscar 5 mg daily, Flomax 0.4 mg daily  Polyneuropathy SNF -continue Lyrica 300 mg twice daily  CAD/pacemaker SNF -continue Plavix 75 mg daily, nadolol 10 mg at bedtime, patient is on statin;   Chronic anticoagulation  SNF - patient is also on Coumadin?-Cause unknown-peripheral vascular disease?   Spent greater than 45 minutes;> 50% of time with patient was spent reviewing records, labs, tests and studies, counseling and developing plan of care  Noah Delaine. Sheppard Coil, MD

## 2017-11-29 LAB — CULTURE, BLOOD (ROUTINE X 2)
CULTURE: NO GROWTH
Special Requests: ADEQUATE

## 2017-11-29 LAB — CULTURE, BLOOD (SINGLE)
CULTURE: NO GROWTH
SPECIAL REQUESTS: ADEQUATE

## 2017-11-30 ENCOUNTER — Encounter: Payer: Self-pay | Admitting: Internal Medicine

## 2017-11-30 DIAGNOSIS — N400 Enlarged prostate without lower urinary tract symptoms: Secondary | ICD-10-CM | POA: Insufficient documentation

## 2017-11-30 DIAGNOSIS — Z7901 Long term (current) use of anticoagulants: Secondary | ICD-10-CM | POA: Insufficient documentation

## 2017-11-30 DIAGNOSIS — C229 Malignant neoplasm of liver, not specified as primary or secondary: Secondary | ICD-10-CM | POA: Insufficient documentation

## 2017-11-30 DIAGNOSIS — R7989 Other specified abnormal findings of blood chemistry: Secondary | ICD-10-CM | POA: Insufficient documentation

## 2017-11-30 DIAGNOSIS — I2510 Atherosclerotic heart disease of native coronary artery without angina pectoris: Secondary | ICD-10-CM | POA: Insufficient documentation

## 2017-11-30 DIAGNOSIS — F0390 Unspecified dementia without behavioral disturbance: Secondary | ICD-10-CM | POA: Insufficient documentation

## 2017-12-03 DIAGNOSIS — K7581 Nonalcoholic steatohepatitis (NASH): Secondary | ICD-10-CM | POA: Diagnosis not present

## 2017-12-03 DIAGNOSIS — K766 Portal hypertension: Secondary | ICD-10-CM | POA: Diagnosis not present

## 2017-12-03 DIAGNOSIS — D7589 Other specified diseases of blood and blood-forming organs: Secondary | ICD-10-CM | POA: Diagnosis not present

## 2017-12-03 DIAGNOSIS — K7460 Unspecified cirrhosis of liver: Secondary | ICD-10-CM | POA: Diagnosis not present

## 2017-12-03 DIAGNOSIS — C8298 Follicular lymphoma, unspecified, lymph nodes of multiple sites: Secondary | ICD-10-CM | POA: Diagnosis not present

## 2017-12-03 DIAGNOSIS — C220 Liver cell carcinoma: Secondary | ICD-10-CM | POA: Diagnosis not present

## 2017-12-03 DIAGNOSIS — Z6841 Body Mass Index (BMI) 40.0 and over, adult: Secondary | ICD-10-CM | POA: Diagnosis not present

## 2017-12-03 DIAGNOSIS — E1100 Type 2 diabetes mellitus with hyperosmolarity without nonketotic hyperglycemic-hyperosmolar coma (NKHHC): Secondary | ICD-10-CM | POA: Diagnosis not present

## 2017-12-03 DIAGNOSIS — D696 Thrombocytopenia, unspecified: Secondary | ICD-10-CM | POA: Diagnosis not present

## 2017-12-03 DIAGNOSIS — Z9221 Personal history of antineoplastic chemotherapy: Secondary | ICD-10-CM | POA: Diagnosis not present

## 2017-12-04 DIAGNOSIS — F0390 Unspecified dementia without behavioral disturbance: Secondary | ICD-10-CM | POA: Diagnosis not present

## 2017-12-04 DIAGNOSIS — F419 Anxiety disorder, unspecified: Secondary | ICD-10-CM | POA: Diagnosis not present

## 2017-12-04 DIAGNOSIS — F4320 Adjustment disorder, unspecified: Secondary | ICD-10-CM | POA: Diagnosis not present

## 2017-12-04 DIAGNOSIS — F329 Major depressive disorder, single episode, unspecified: Secondary | ICD-10-CM | POA: Diagnosis not present

## 2017-12-11 ENCOUNTER — Encounter: Payer: Self-pay | Admitting: Internal Medicine

## 2017-12-11 ENCOUNTER — Non-Acute Institutional Stay (SKILLED_NURSING_FACILITY): Payer: Medicare Other | Admitting: Internal Medicine

## 2017-12-11 DIAGNOSIS — E1142 Type 2 diabetes mellitus with diabetic polyneuropathy: Secondary | ICD-10-CM

## 2017-12-11 DIAGNOSIS — K7030 Alcoholic cirrhosis of liver without ascites: Secondary | ICD-10-CM

## 2017-12-11 DIAGNOSIS — K219 Gastro-esophageal reflux disease without esophagitis: Secondary | ICD-10-CM

## 2017-12-11 DIAGNOSIS — N400 Enlarged prostate without lower urinary tract symptoms: Secondary | ICD-10-CM

## 2017-12-11 DIAGNOSIS — Z7901 Long term (current) use of anticoagulants: Secondary | ICD-10-CM

## 2017-12-11 DIAGNOSIS — R531 Weakness: Secondary | ICD-10-CM | POA: Diagnosis not present

## 2017-12-11 DIAGNOSIS — C220 Liver cell carcinoma: Secondary | ICD-10-CM

## 2017-12-11 DIAGNOSIS — F0390 Unspecified dementia without behavioral disturbance: Secondary | ICD-10-CM

## 2017-12-11 DIAGNOSIS — I2510 Atherosclerotic heart disease of native coronary artery without angina pectoris: Secondary | ICD-10-CM

## 2017-12-11 DIAGNOSIS — Z794 Long term (current) use of insulin: Secondary | ICD-10-CM

## 2017-12-11 DIAGNOSIS — R7989 Other specified abnormal findings of blood chemistry: Secondary | ICD-10-CM | POA: Diagnosis not present

## 2017-12-11 DIAGNOSIS — Z950 Presence of cardiac pacemaker: Secondary | ICD-10-CM

## 2017-12-11 DIAGNOSIS — G4733 Obstructive sleep apnea (adult) (pediatric): Secondary | ICD-10-CM

## 2017-12-11 DIAGNOSIS — I10 Essential (primary) hypertension: Secondary | ICD-10-CM

## 2017-12-11 NOTE — Progress Notes (Signed)
Location:  [facility] Room Number: 179X Place of Service:     Keith Delaine. Sheppard Coil, MD  Patient Care Team: Tisovec, Fransico Him, MD as PCP - General (Internal Medicine)  Extended Emergency Contact Information Primary Emergency Contact: Gambuccini,[name] Address: [place], [place] [address] Johnnette Litter of [place] Phone: [phone] Mobile Phone: [phone] Relation: Spouse  Allergies  Allergen Reactions  . Fentanyl Itching and Rash  . Iodine Rash  . Merbromin Rash  . Other Rash    OPIODS OPIODS    Chief Complaint  Patient presents with  . Discharge Note    Discharge from [facility]    HPI:  77 y.o. male with diabetes mellitus type 2, cirrhosis with hepatocellular carcinoma, BPH, AV nodal block with pacemaker in place, OSA on CPAP, peripheral neuropathy, came to [facility] with complaint of 10 days of feeling weak especially in his lower extremities over the prior 4 days patient had been unable to get out of bed.  Patient was admitted to [facility] from 8/18-22 where he was treated for hepatic encephalopathy with lactulose which he did not tolerate and refused to take.  He was started on Xifaxan secondary to medication noncompliance.  CT of the head was negative for any acute infarct, it was felt that his lower extremity weakness was due to his hepatocellular carcinoma.  All else being stable patient was admitted to skilled nursing facility for OT/PT and is now ready to be discharged home.    Past Medical History:  Diagnosis Date  . Cirrhosis of liver ([place])   . Diabetes mellitus   . Hepatic cancer ([place])   . Prostate hypertrophy   . Stroke ([place])   . Varicose veins   . Weakness of both legs     Past Surgical History:  Procedure Laterality Date  . APPENDECTOMY    . EYE SURGERY     bilateral cataract   . left arm surgery     age 89  . left hand surgery     tendons ripped on left hand  .  LIVER RESECTION    . TONSILLECTOMY    . TOTAL KNEE ARTHROPLASTY Left 12/14/2012   Procedure: LEFT TOTAL KNEE ARTHROPLASTY;  Surgeon: Gearlean Alf, MD;  Location: [facility] ORS;  Service: Orthopedics;  Laterality: Left;     reports that he has quit smoking. He quit smokeless tobacco use about 34 years ago. He reports that he does not drink alcohol or use drugs. Social History   Socioeconomic History  . Marital status: Married    Spouse name: Not on file  . Number of children: Not on file  . Years of education: Not on file  . Highest education level: Not on file  Occupational History  . Not on file  Social Needs  . Financial resource strain: Not on file  . Food insecurity:    Worry: Not on file    Inability: Not on file  . Transportation needs:    Medical: Not on file    Non-medical: Not on file  Tobacco Use  . Smoking status: Former Research scientist (life sciences)  . Smokeless tobacco: Former Systems developer    Quit date: 11/20/1983  Substance and Sexual Activity  . Alcohol use: No    Alcohol/week: 0.0 standard drinks  . Drug use: No  . Sexual activity: Not Currently  Lifestyle  . Physical activity:    Days  per week: Not on file    Minutes per session: Not on file  . Stress: Not on file  Relationships  . Social connections:    Talks on phone: Not on file    Gets together: Not on file    Attends religious service: Not on file    Active member of club or organization: Not on file    Attends meetings of clubs or organizations: Not on file    Relationship status: Not on file  . Intimate partner violence:    Fear of current or ex partner: Not on file    Emotionally abused: Not on file    Physically abused: Not on file    Forced sexual activity: Not on file  Other Topics Concern  . Not on file  Social History Narrative  . Not on file    Pertinent  Health Maintenance Due  Topic Date Due  . FOOT EXAM  06/26/1950  . OPHTHALMOLOGY EXAM  06/26/1950  . URINE MICROALBUMIN  06/26/1952  . COLONOSCOPY  06/26/1992   . PNA vac Low Risk Adult (1 of 2 - PCV13) 06/27/2007  . INFLUENZA VACCINE  11/06/2017  . HEMOGLOBIN A1C  05/26/2018    Medications: Allergies as of 12/11/2017      Reactions   Fentanyl Itching, Rash   Iodine Rash   Merbromin Rash   Other Rash   OPIODS OPIODS      Medication List        Accurate as of 12/11/17 11:59 PM. Always use your most recent med list.          albuterol 108 (90 Base) MCG/ACT inhaler Commonly known as:  PROVENTIL HFA;VENTOLIN HFA Inhale 1-2 puffs into the lungs every 6 (six) hours as needed for wheezing or shortness of breath.   amitriptyline 25 MG tablet Commonly known as:  ELAVIL Take 50 mg by mouth at bedtime.   bisacodyl 10 MG suppository Commonly known as:  DULCOLAX Place 1 suppository (10 mg total) rectally daily as needed.   cholecalciferol 1000 units tablet Commonly known as:  VITAMIN D Take 1,000 Units by mouth daily.   clopidogrel 75 MG tablet Commonly known as:  PLAVIX Take 75 mg by mouth daily.   DSS 100 MG Caps Take 100 mg by mouth 2 (two) times daily.   finasteride 5 MG tablet Commonly known as:  PROSCAR Take 5 mg by mouth daily.   folic acid 1 MG tablet Commonly known as:  FOLVITE Take 1 mg by mouth daily.   hydrocerin Crea Apply 1 application topically daily as needed (dry skin).   insulin aspart 100 UNIT/ML injection Commonly known as:  novoLOG Inject 5-30 Units into the skin 3 (three) times daily before meals. Per sliding scale.   insulin glargine 100 UNIT/ML injection Commonly known as:  LANTUS Inject 40 Units into the skin 2 (two) times daily.   iron polysaccharides 150 MG capsule Commonly known as:  NIFEREX Take 1 capsule (150 mg total) by mouth 2 (two) times daily.   lactulose 10 GM/15ML solution Commonly known as:  CHRONULAC Take 22.5 mLs (15 g total) by mouth 3 (three) times daily.   LASIX 20 MG tablet Generic drug:  furosemide Take 20 mg by mouth. take 1 tab po every other day for decompensated  liver cirrhosis   memantine 10 MG tablet Commonly known as:  NAMENDA Take 20 mg by mouth at bedtime.   methocarbamol 500 MG tablet Commonly known as:  ROBAXIN Take 1 tablet (500  mg total) by mouth every 6 (six) hours as needed.   metoCLOPramide 5 MG tablet Commonly known as:  REGLAN Take 1-2 tablets (5-10 mg total) by mouth every 8 (eight) hours as needed (if ondansetron (ZOFRAN) ineffective.).   nadolol 20 MG tablet Commonly known as:  CORGARD Take 10 mg by mouth at bedtime.   ondansetron 4 MG tablet Commonly known as:  ZOFRAN Take 1 tablet (4 mg total) by mouth every 6 (six) hours as needed for nausea.   pantoprazole 40 MG tablet Commonly known as:  PROTONIX Take 40 mg by mouth daily.   polyethylene glycol packet Commonly known as:  MIRALAX / GLYCOLAX Take 17 g by mouth daily as needed.   polyvinyl alcohol 1.4 % ophthalmic solution Commonly known as:  LIQUIFILM TEARS Place 1 drop into both eyes 2 (two) times daily as needed (dry eyes).   pregabalin 300 MG capsule Commonly known as:  LYRICA Take 300 mg by mouth 2 (two) times daily.   rifaximin 550 MG Tabs tablet Commonly known as:  XIFAXAN Take 1 tablet (550 mg total) by mouth 2 (two) times daily.   simvastatin 20 MG tablet Commonly known as:  ZOCOR Take 10 mg by mouth every evening.   spironolactone 25 MG tablet Commonly known as:  ALDACTONE Take 25 mg by mouth every other day.   tamsulosin 0.4 MG Caps capsule Commonly known as:  FLOMAX Take 0.4 mg by mouth daily after supper.        Vitals:   12/11/17 1012  BP: 113/67  Pulse: 60  Resp: 18  Temp: (!) 97.1 F (36.2 C)  Weight: (!) 317 lb (143.8 kg)  Height: 5' 11"  (1.803 m)   Body mass index is 44.21 kg/m.  Physical Exam  GENERAL APPEARANCE: Alert, conversant. No acute distress.  HEENT: Unremarkable. RESPIRATORY: Breathing is even, unlabored. Lung sounds are clear   CARDIOVASCULAR: Heart RRR no murmurs, rubs or gallops. No peripheral edema.   GASTROINTESTINAL: Abdomen is soft, non-tender, not distended w/ normal bowel sounds.  NEUROLOGIC: Cranial nerves 2-12 grossly intact. Moves all extremities   Labs reviewed: Basic Metabolic Panel: Recent Labs    11/23/17 1404 11/24/17 0555 11/26/17 0533  NA 139 140 140  K 4.1 3.6 4.0  CL 105 106 107  CO2 26 26 25   GLUCOSE 218* 135* 177*  BUN 11 15 17   CREATININE 1.10 1.15 1.07  CALCIUM 8.5* 7.8* 8.2*   No results found for: [facility] Liver Function Tests: Recent Labs    11/23/17 1404 11/24/17 0555 11/26/17 0533  AST 32 24 27  ALT 22 16 18   ALKPHOS 89 74 77  BILITOT 2.9* 2.1* 1.4*  PROT 6.6 5.4* 5.9*  ALBUMIN 3.0* 2.5* 2.6*   Recent Labs    11/23/17 1404  LIPASE 23   Recent Labs    11/23/17 1921 11/26/17 0533  AMMONIA 60* 72*   CBC: Recent Labs    11/23/17 1404 11/23/17 1929 11/24/17 0555 11/26/17 0533  WBC 6.4  --  5.2 4.0  NEUTROABS 4.4  --   --   --   HGB 12.5*  --  11.5* 12.1*  HCT 37.4* 37.6 34.4* 35.9*  MCV 96.9  --  96.1 94.5  PLT 63*  --  58* 78*   Lipid Recent Labs    11/23/17 1404  CHOL 149  HDL 37*  LDLCALC 96  TRIG 79   Cardiac Enzymes: Recent Labs    11/23/17 1404  CKTOTAL 89   BNP: No  results for input(s): BNP in the last 8760 hours. CBG: Recent Labs    11/26/17 2139 11/27/17 0615 11/27/17 1133  GLUCAP 209* 248* 253*    Procedures and Imaging Studies During Stay: Dg Chest 2 View  Result Date: 11/23/2017 CLINICAL DATA:  Weakness EXAM: CHEST - 2 VIEW COMPARISON:  12/03/2012 FINDINGS: Cardiac enlargement. Negative for heart failure. Dual lead pacemaker has been placed since the prior study with leads in the right atrium and right ventricle region. Atherosclerotic aortic arch Negative for heart failure. Negative for pneumonia or effusion. Mild atelectasis in the bases. IMPRESSION: Cardiac enlargement.  Mild bibasilar atelectasis. Dual lead pacemaker Electronically Signed   By: Franchot Gallo M.D.   On: 11/23/2017 14:43    Ct Head Wo Contrast  Result Date: 11/23/2017 CLINICAL DATA:  Ataxia, suspect stroke. History of hypertension, diabetes, liver cancer. EXAM: CT HEAD WITHOUT CONTRAST TECHNIQUE: Contiguous axial images were obtained from the base of the skull through the vertex without intravenous contrast. COMPARISON:  CT HEAD June 08, 2012. FINDINGS: BRAIN: No intraparenchymal hemorrhage, mass effect nor midline shift. The ventricles and sulci are normal for age. Old bilateral basal ganglia and LEFT thalamus lacunar infarct, possible RIGHT old thalamus lacunar infarct. Old small RIGHT occipital lobe infarct. Patchy supratentorial white matter hypodensities within normal range for patient's age, though non-specific are most compatible with chronic small vessel ischemic disease. No acute large vascular territory infarcts. No abnormal extra-axial fluid collections. Basal cisterns are patent. VASCULAR: Moderate to severe calcific atherosclerosis of the carotid siphons. Unchanged focally hypodense RIGHT transverse sinus, potential arachnoid granulation. SKULL: No skull fracture. No significant scalp soft tissue swelling. SINUSES/ORBITS: Trace paranasal sinus mucosal thickening. Perforated nasal septum. Mastoid air cells are well aerated.The included ocular globes and orbital contents are non-suspicious. Status post bilateral ocular lens implants. OTHER: Patient is edentulous. IMPRESSION: 1. No acute intracranial process. 2. Old small RIGHT occipital lobe infarct.  Old lacunar infarcts. 3. Mild chronic small vessel ischemic changes. Electronically Signed   By: Elon Alas M.D.   On: 11/23/2017 16:03    Assessment/Plan:   Hepatocellular carcinoma (HCC)  Weakness  Increased ammonia level  Alcoholic cirrhosis of liver without ascites (HCC)  Type 2 diabetes mellitus with diabetic polyneuropathy, with long-term current use of insulin (HCC)  Essential hypertension  Gastroesophageal reflux disease without  esophagitis  OSA (obstructive sleep apnea)  Pacemaker  Dementia without behavioral disturbance, unspecified dementia type  Benign prostatic hyperplasia without lower urinary tract symptoms  Coronary artery disease involving native coronary artery of native heart without angina pectoris  Chronic anticoagulation   Patient is being discharged with the following home health services:  OT/PT/Nursing  Patient is being discharged with the following durable medical equipment:  Bariatric front wheel walker  Patient has been advised to f/u with their PCP in 1-2 weeks to bring them up to date on their rehab stay.  Social services at facility was responsible for arranging this appointment.  Pt was provided with a 30 day supply of prescriptions for medications and refills must be obtained from their PCP.  For controlled substances, a more limited supply may be provided adequate until PCP appointment only.  Medications have been reconciled.   Time spent > 30 min;> 50% of time with patient was spent reviewing records, labs, tests and studies, counseling and developing plan of care  Keith Delaine. Sheppard Coil, MD

## 2017-12-12 DIAGNOSIS — R262 Difficulty in walking, not elsewhere classified: Secondary | ICD-10-CM | POA: Diagnosis not present

## 2017-12-13 ENCOUNTER — Encounter: Payer: Self-pay | Admitting: Internal Medicine

## 2017-12-17 ENCOUNTER — Encounter (HOSPITAL_COMMUNITY): Payer: Self-pay

## 2017-12-17 ENCOUNTER — Emergency Department (HOSPITAL_COMMUNITY): Payer: Medicare Other

## 2017-12-17 ENCOUNTER — Other Ambulatory Visit: Payer: Self-pay

## 2017-12-17 ENCOUNTER — Observation Stay (HOSPITAL_COMMUNITY)
Admission: EM | Admit: 2017-12-17 | Discharge: 2017-12-19 | Disposition: A | Discharge status: Home / Self Care | Payer: Medicare Other | Attending: Internal Medicine | Admitting: Internal Medicine

## 2017-12-17 DIAGNOSIS — E119 Type 2 diabetes mellitus without complications: Secondary | ICD-10-CM

## 2017-12-17 DIAGNOSIS — I10 Essential (primary) hypertension: Secondary | ICD-10-CM | POA: Diagnosis not present

## 2017-12-17 DIAGNOSIS — Z7902 Long term (current) use of antithrombotics/antiplatelets: Secondary | ICD-10-CM | POA: Insufficient documentation

## 2017-12-17 DIAGNOSIS — Z87891 Personal history of nicotine dependence: Secondary | ICD-10-CM | POA: Diagnosis not present

## 2017-12-17 DIAGNOSIS — F0390 Unspecified dementia without behavioral disturbance: Secondary | ICD-10-CM | POA: Diagnosis not present

## 2017-12-17 DIAGNOSIS — Z96652 Presence of left artificial knee joint: Secondary | ICD-10-CM | POA: Diagnosis not present

## 2017-12-17 DIAGNOSIS — W19XXXA Unspecified fall, initial encounter: Secondary | ICD-10-CM | POA: Diagnosis not present

## 2017-12-17 DIAGNOSIS — I951 Orthostatic hypotension: Secondary | ICD-10-CM | POA: Diagnosis not present

## 2017-12-17 DIAGNOSIS — Z950 Presence of cardiac pacemaker: Secondary | ICD-10-CM | POA: Diagnosis present

## 2017-12-17 DIAGNOSIS — R42 Dizziness and giddiness: Secondary | ICD-10-CM

## 2017-12-17 DIAGNOSIS — Z794 Long term (current) use of insulin: Secondary | ICD-10-CM | POA: Diagnosis not present

## 2017-12-17 DIAGNOSIS — C220 Liver cell carcinoma: Secondary | ICD-10-CM | POA: Diagnosis not present

## 2017-12-17 DIAGNOSIS — K7460 Unspecified cirrhosis of liver: Secondary | ICD-10-CM

## 2017-12-17 DIAGNOSIS — R55 Syncope and collapse: Secondary | ICD-10-CM | POA: Diagnosis not present

## 2017-12-17 DIAGNOSIS — R001 Bradycardia, unspecified: Secondary | ICD-10-CM | POA: Diagnosis not present

## 2017-12-17 DIAGNOSIS — G4733 Obstructive sleep apnea (adult) (pediatric): Secondary | ICD-10-CM | POA: Diagnosis not present

## 2017-12-17 DIAGNOSIS — I2510 Atherosclerotic heart disease of native coronary artery without angina pectoris: Secondary | ICD-10-CM | POA: Insufficient documentation

## 2017-12-17 DIAGNOSIS — N400 Enlarged prostate without lower urinary tract symptoms: Secondary | ICD-10-CM | POA: Diagnosis not present

## 2017-12-17 DIAGNOSIS — C229 Malignant neoplasm of liver, not specified as primary or secondary: Secondary | ICD-10-CM | POA: Diagnosis present

## 2017-12-17 DIAGNOSIS — R05 Cough: Secondary | ICD-10-CM | POA: Diagnosis not present

## 2017-12-17 DIAGNOSIS — I959 Hypotension, unspecified: Secondary | ICD-10-CM | POA: Diagnosis not present

## 2017-12-17 DIAGNOSIS — E1140 Type 2 diabetes mellitus with diabetic neuropathy, unspecified: Secondary | ICD-10-CM | POA: Diagnosis not present

## 2017-12-17 LAB — URINALYSIS, ROUTINE W REFLEX MICROSCOPIC
Bilirubin Urine: NEGATIVE
GLUCOSE, UA: NEGATIVE mg/dL
Hgb urine dipstick: NEGATIVE
Ketones, ur: NEGATIVE mg/dL
Leukocytes, UA: NEGATIVE
Nitrite: NEGATIVE
PH: 6 (ref 5.0–8.0)
Protein, ur: NEGATIVE mg/dL
SPECIFIC GRAVITY, URINE: 1.005 (ref 1.005–1.030)

## 2017-12-17 LAB — BASIC METABOLIC PANEL
ANION GAP: 7 (ref 5–15)
BUN: 10 mg/dL (ref 8–23)
CHLORIDE: 110 mmol/L (ref 98–111)
CO2: 24 mmol/L (ref 22–32)
Calcium: 8.5 mg/dL — ABNORMAL LOW (ref 8.9–10.3)
Creatinine, Ser: 0.97 mg/dL (ref 0.61–1.24)
GFR calc Af Amer: >60 mL/min (ref >60)
GFR calc non Af Amer: >60 mL/min (ref >60)
GLUCOSE: 135 mg/dL — AB (ref 70–99)
POTASSIUM: 4 mmol/L (ref 3.5–5.1)
SODIUM: 141 mmol/L (ref 135–145)

## 2017-12-17 LAB — CBC
HEMATOCRIT: 32.8 % — AB (ref 39.0–52.0)
HEMOGLOBIN: 11 g/dL — AB (ref 13.0–17.0)
MCH: 32.4 pg (ref 26.0–34.0)
MCHC: 33.5 g/dL (ref 30.0–36.0)
MCV: 96.8 fL (ref 78.0–100.0)
Platelets: 45 10*3/uL — ABNORMAL LOW (ref 150–400)
RBC: 3.39 MIL/uL — ABNORMAL LOW (ref 4.22–5.81)
RDW: 15.5 % (ref 11.5–15.5)
WBC: 2.4 10*3/uL — AB (ref 4.0–10.5)

## 2017-12-17 LAB — HEPATIC FUNCTION PANEL
ALK PHOS: 82 U/L (ref 38–126)
ALT: 24 U/L (ref 0–44)
AST: 39 U/L (ref 15–41)
Albumin: 2.8 g/dL — ABNORMAL LOW (ref 3.5–5.0)
BILIRUBIN TOTAL: 1.6 mg/dL — AB (ref 0.3–1.2)
Bilirubin, Direct: 0.4 mg/dL — ABNORMAL HIGH (ref 0.0–0.2)
Indirect Bilirubin: 1.2 mg/dL — ABNORMAL HIGH (ref 0.3–0.9)
TOTAL PROTEIN: 5.7 g/dL — AB (ref 6.5–8.1)

## 2017-12-17 LAB — GLUCOSE, CAPILLARY: GLUCOSE-CAPILLARY: 238 mg/dL — AB (ref 70–99)

## 2017-12-17 LAB — CBG MONITORING, ED
GLUCOSE-CAPILLARY: 196 mg/dL — AB (ref 70–99)
Glucose-Capillary: 127 mg/dL — ABNORMAL HIGH (ref 70–99)

## 2017-12-17 LAB — MAGNESIUM: Magnesium: 1.8 mg/dL (ref 1.7–2.4)

## 2017-12-17 LAB — AMMONIA: AMMONIA: 68 umol/L — AB (ref 9–35)

## 2017-12-17 LAB — PROTIME-INR
INR: 1.19
Prothrombin Time: 15 seconds (ref 11.4–15.2)

## 2017-12-17 LAB — BRAIN NATRIURETIC PEPTIDE: B NATRIURETIC PEPTIDE 5: 93.8 pg/mL (ref 0.0–100.0)

## 2017-12-17 MED ORDER — INSULIN ASPART 100 UNIT/ML ~~LOC~~ SOLN
0.0000 [IU] | Freq: Three times a day (TID) | SUBCUTANEOUS | Status: DC
  Administered 2017-12-18 – 2017-12-19 (×3): 1 [IU] via SUBCUTANEOUS

## 2017-12-17 MED ORDER — CLOPIDOGREL BISULFATE 75 MG PO TABS
75.0000 mg | ORAL_TABLET | Freq: Every day | ORAL | Status: DC
  Administered 2017-12-18 – 2017-12-19 (×2): 75 mg via ORAL
  Filled 2017-12-17 (×2): qty 1

## 2017-12-17 MED ORDER — FOLIC ACID 1 MG PO TABS
1.0000 mg | ORAL_TABLET | Freq: Every day | ORAL | Status: DC
  Administered 2017-12-18 – 2017-12-19 (×2): 1 mg via ORAL
  Filled 2017-12-17 (×2): qty 1

## 2017-12-17 MED ORDER — ONDANSETRON HCL 4 MG PO TABS: 4.0000 mg | ORAL_TABLET | Freq: Four times a day (QID) | ORAL | Status: DC | PRN

## 2017-12-17 MED ORDER — ACETAMINOPHEN 325 MG PO TABS: 650.0000 mg | ORAL_TABLET | Freq: Four times a day (QID) | ORAL | Status: DC | PRN

## 2017-12-17 MED ORDER — RIFAXIMIN 550 MG PO TABS
550.0000 mg | ORAL_TABLET | Freq: Two times a day (BID) | ORAL | Status: DC
  Administered 2017-12-18: 550 mg via ORAL
  Filled 2017-12-17 (×2): qty 1

## 2017-12-17 MED ORDER — TAMSULOSIN HCL 0.4 MG PO CAPS
0.4000 mg | ORAL_CAPSULE | Freq: Every day | ORAL | Status: DC
  Administered 2017-12-18: 0.4 mg via ORAL
  Filled 2017-12-17: qty 1

## 2017-12-17 MED ORDER — ALBUTEROL SULFATE (2.5 MG/3ML) 0.083% IN NEBU: 3.0000 mL | INHALATION_SOLUTION | Freq: Four times a day (QID) | RESPIRATORY_TRACT | Status: DC | PRN

## 2017-12-17 MED ORDER — PANTOPRAZOLE SODIUM 40 MG PO TBEC
40.0000 mg | DELAYED_RELEASE_TABLET | Freq: Every day | ORAL | Status: DC
  Administered 2017-12-18 – 2017-12-19 (×2): 40 mg via ORAL
  Filled 2017-12-17 (×2): qty 1

## 2017-12-17 MED ORDER — PREGABALIN 75 MG PO CAPS
300.0000 mg | ORAL_CAPSULE | Freq: Two times a day (BID) | ORAL | Status: DC
  Administered 2017-12-18 – 2017-12-19 (×4): 300 mg via ORAL
  Filled 2017-12-17 (×4): qty 4

## 2017-12-17 MED ORDER — SODIUM CHLORIDE 0.9 % IV BOLUS
1000.0000 mL | Freq: Once | INTRAVENOUS | Status: AC
  Administered 2017-12-17: 1000 mL via INTRAVENOUS

## 2017-12-17 MED ORDER — TRAMADOL HCL 50 MG PO TABS
50.0000 mg | ORAL_TABLET | Freq: Three times a day (TID) | ORAL | Status: DC
  Administered 2017-12-18: 50 mg via ORAL
  Filled 2017-12-17 (×2): qty 1

## 2017-12-17 MED ORDER — AMITRIPTYLINE HCL 25 MG PO TABS
37.5000 mg | ORAL_TABLET | Freq: Every day | ORAL | Status: DC
  Administered 2017-12-18 (×2): 37.5 mg via ORAL
  Filled 2017-12-17 (×2): qty 2

## 2017-12-17 MED ORDER — FINASTERIDE 5 MG PO TABS
5.0000 mg | ORAL_TABLET | Freq: Every day | ORAL | Status: DC
  Administered 2017-12-18 – 2017-12-19 (×2): 5 mg via ORAL
  Filled 2017-12-17 (×2): qty 1

## 2017-12-17 MED ORDER — INSULIN GLARGINE 100 UNIT/ML ~~LOC~~ SOLN
35.0000 [IU] | Freq: Two times a day (BID) | SUBCUTANEOUS | Status: DC
  Administered 2017-12-18 – 2017-12-19 (×4): 35 [IU] via SUBCUTANEOUS
  Filled 2017-12-17 (×4): qty 0.35

## 2017-12-17 MED ORDER — SIMVASTATIN 10 MG PO TABS
10.0000 mg | ORAL_TABLET | Freq: Every evening | ORAL | Status: DC
  Administered 2017-12-18 (×2): 10 mg via ORAL
  Filled 2017-12-17 (×3): qty 1

## 2017-12-17 MED ORDER — VITAMIN D 1000 UNITS PO TABS
1000.0000 [IU] | ORAL_TABLET | Freq: Every day | ORAL | Status: DC
  Administered 2017-12-18 – 2017-12-19 (×2): 1000 [IU] via ORAL
  Filled 2017-12-17 (×2): qty 1

## 2017-12-17 MED ORDER — ONDANSETRON HCL 4 MG/2ML IJ SOLN: 4.0000 mg | Freq: Four times a day (QID) | INTRAMUSCULAR | Status: DC | PRN

## 2017-12-17 MED ORDER — LACTULOSE 10 GM/15ML PO SOLN
15.0000 g | Freq: Three times a day (TID) | ORAL | Status: DC
  Administered 2017-12-18: 15 g via ORAL
  Filled 2017-12-17: qty 30

## 2017-12-17 MED ORDER — ACETAMINOPHEN 650 MG RE SUPP: 650.0000 mg | Freq: Four times a day (QID) | RECTAL | Status: DC | PRN

## 2017-12-17 MED ORDER — MEMANTINE HCL 10 MG PO TABS
20.0000 mg | ORAL_TABLET | Freq: Every day | ORAL | Status: DC
  Administered 2017-12-18 (×2): 20 mg via ORAL
  Filled 2017-12-17 (×2): qty 2

## 2017-12-17 NOTE — ED Notes (Signed)
CBG 196

## 2017-12-17 NOTE — ED Notes (Signed)
Patient transported to X-ray 

## 2017-12-17 NOTE — Progress Notes (Signed)
RT went into room to discuss CPAP with patient.  Patient stated he has a CPAP at home, but has not used it in a month.  Patient could not remember any settings.  Patient also refused CPAP at that time.

## 2017-12-17 NOTE — ED Triage Notes (Signed)
Pt from home with ems for hypotension, syncopal episode with a fall. Pt was recently admitted here for the same and was discharged to a rehab facility for 2 weeks. Pt came home Saturday of this week. Lying BP 106/64 Sitting 66/40. CBG 210. Pt denies pain, nausea or feeling lightheaded. Pt a.o upon arrival, nad

## 2017-12-17 NOTE — ED Provider Notes (Addendum)
[facility] Provider Note   CSN: [id number] Arrival date & time:        History   Chief Complaint Chief Complaint  Patient presents with  . Hypotension  . Loss of Consciousness    HPI Keith [name] is a 77 y.o. male.   The history is provided by the patient and medical records. No language interpreter was used.  Loss of Consciousness   This is a new problem. The current episode started less than 1 hour ago. The problem occurs constantly. The problem has been resolved. He lost consciousness for a period of less than one minute. The problem is associated with standing up. Associated symptoms include light-headedness. Pertinent negatives include abdominal pain, back pain, chest pain, confusion, congestion, diaphoresis, dizziness, fever, focal weakness, headaches, malaise/fatigue, nausea, palpitations, seizures, slurred speech, vertigo, visual change, vomiting and weakness. He has tried nothing for the symptoms. The treatment provided no relief. His past medical history is significant for DM and HTN.     Past Medical History:  Diagnosis Date  . Cirrhosis of liver ([place])   . Diabetes mellitus   . Hepatic cancer ([place])   . Prostate hypertrophy   . Stroke ([place])   . Varicose veins   . Weakness of both legs     Patient Active Problem List   Diagnosis Date Noted  . Increased ammonia level 11/30/2017  . Hepatic cancer ([place]) 11/30/2017  . Dementia 11/30/2017  . BPH (benign prostatic hyperplasia) 11/30/2017  . Coronary artery disease 11/30/2017  . Chronic anticoagulation 11/30/2017  . Weakness 11/23/2017  . Cirrhosis of liver ([place]) 11/23/2017  . OSA (obstructive sleep apnea) 11/23/2017  . Pacemaker 11/23/2017  . Varicose veins of left lower extremity with complications 02/54/2706  . Knee pain, acute 01/26/2013  . Constipation 12/22/2012  . Neuropathy 12/22/2012  . Prostate hypertrophy 12/22/2012  . Acute blood loss anemia  12/22/2012  . Hyperlipidemia 12/22/2012  . DM2 (diabetes mellitus, type 2) ([place]) 12/17/2012  . GERD (gastroesophageal reflux disease) 12/17/2012  . Hypertension 12/17/2012  . Thrombocytopenia, unspecified ([place]) 12/17/2012  . Postoperative anemia due to acute blood loss 12/15/2012  . Hyponatremia 12/15/2012  . OA (osteoarthritis) of knee 12/14/2012    Past Surgical History:  Procedure Laterality Date  . APPENDECTOMY    . EYE SURGERY     bilateral cataract   . left arm surgery     age 74  . left hand surgery     tendons ripped on left hand  . LIVER RESECTION    . TONSILLECTOMY    . TOTAL KNEE ARTHROPLASTY Left 12/14/2012   Procedure: LEFT TOTAL KNEE ARTHROPLASTY;  Surgeon: Gearlean Alf, MD;  Location: [facility] OR[facility];  Service: Orthopedics;  Laterality: Left;        Home Medications    Prior to Admission medications   Medication Sig Start Date End Date Taking? Authorizing Provider  albuterol (PROVENTIL HFA;VENTOLIN HFA) 108 (90 BASE) MCG/ACT inhaler Inhale 1-2 puffs into the lungs every 6 (six) hours as needed for wheezing or shortness of breath.    [provider]  amitriptyline (ELAVIL) 25 MG tablet Take 50 mg by mouth at bedtime.     [provider]  bisacodyl (DULCOLAX) 10 MG suppository Place 1 suppository (10 mg total) rectally daily as needed. 12/17/12   Perkins, Alexzandrew L, PA-C  cholecalciferol (VITAMIN D) 1000 UNITS tablet Take 1,000 Units by mouth daily.    [provider]  clopidogrel (PLAVIX) 75 MG  tablet Take 75 mg by mouth daily.    [provider]  docusate sodium 100 MG CAPS Take 100 mg by mouth 2 (two) times daily. 12/17/12   Perkins, Alexzandrew L, PA-C  finasteride (PROSCAR) 5 MG tablet Take 5 mg by mouth daily.      [provider]  folic acid (FOLVITE) 1 MG tablet Take 1 mg by mouth daily. 02/13/09   [provider]  furosemide (LASIX) 20 MG tablet Take 20 mg by mouth. take 1 tab po every other day for  decompensated liver cirrhosis    [provider]  hydrocerin (EUCERIN) CREA Apply 1 application topically daily as needed (dry skin).     [provider]  insulin aspart (NOVOLOG) 100 UNIT/ML injection Inject 5-30 Units into the skin 3 (three) times daily before meals. Per sliding scale.    [provider]  insulin glargine (LANTUS) 100 UNIT/ML injection Inject 40 Units into the skin 2 (two) times daily.     [provider]  iron polysaccharides (NIFEREX) 150 MG capsule Take 1 capsule (150 mg total) by mouth 2 (two) times daily. 12/17/12   Perkins, Alexzandrew L, PA-C  lactulose (CHRONULAC) 10 GM/15ML solution Take 22.5 mLs (15 g total) by mouth 3 (three) times daily. 11/27/17   Shelly Coss, MD  memantine (NAMENDA) 10 MG tablet Take 20 mg by mouth at bedtime.    [provider]  methocarbamol (ROBAXIN) 500 MG tablet Take 1 tablet (500 mg total) by mouth every 6 (six) hours as needed. 12/17/12   Perkins, Alexzandrew L, PA-C  metoCLOPramide (REGLAN) 5 MG tablet Take 1-2 tablets (5-10 mg total) by mouth every 8 (eight) hours as needed (if ondansetron (ZOFRAN) ineffective.). 12/17/12   Perkins, Alexzandrew L, PA-C  nadolol (CORGARD) 20 MG tablet Take 10 mg by mouth at bedtime. 10/27/17   [provider]  ondansetron (ZOFRAN) 4 MG tablet Take 1 tablet (4 mg total) by mouth every 6 (six) hours as needed for nausea. 12/17/12   Perkins, Alexzandrew L, PA-C  pantoprazole (PROTONIX) 40 MG tablet Take 40 mg by mouth daily.    [provider]  polyethylene glycol (MIRALAX / GLYCOLAX) packet Take 17 g by mouth daily as needed. 12/17/12   Perkins, Alexzandrew L, PA-C  polyvinyl alcohol (LIQUIFILM TEARS) 1.4 % ophthalmic solution Place 1 drop into both eyes 2 (two) times daily as needed (dry eyes).    [provider]  pregabalin (LYRICA) 300 MG capsule Take 300 mg by mouth 2 (two) times daily.    [provider]  rifaximin (XIFAXAN) 550 MG  TABS tablet Take 1 tablet (550 mg total) by mouth 2 (two) times daily. 11/27/17   Shelly Coss, MD  simvastatin (ZOCOR) 20 MG tablet Take 10 mg by mouth every evening.    [provider]  spironolactone (ALDACTONE) 25 MG tablet Take 25 mg by mouth every other day.     [provider]  tamsulosin (FLOMAX) 0.4 MG CAPS Take 0.4 mg by mouth daily after supper.     [provider]    Family History Family History  Problem Relation Age of Onset  . Stroke Father     Social History Social History   Tobacco Use  . Smoking status: Former Research scientist (life sciences)  . Smokeless tobacco: Former Systems developer    Quit date: 11/20/1983  Substance Use Topics  . Alcohol use: No    Alcohol/week: 0.0 standard drinks  . Drug use: No     Allergies  Fentanyl; Iodine; Merbromin; and Other   Review of Systems Review of Systems  Constitutional: Positive for fatigue. Negative for chills, diaphoresis, fever and malaise/fatigue.  HENT: Negative for congestion.   Eyes: Negative for photophobia and visual disturbance.  Respiratory: Positive for cough. Negative for chest tightness, shortness of breath and wheezing.   Cardiovascular: Positive for syncope. Negative for chest pain, palpitations and leg swelling.  Gastrointestinal: Negative for abdominal pain, nausea and vomiting.  Genitourinary: Negative for dysuria, flank pain and frequency.  Musculoskeletal: Negative for back pain, neck pain and neck stiffness.  Skin: Negative for rash and wound.  Neurological: Positive for syncope and light-headedness. Negative for dizziness, vertigo, focal weakness, seizures, weakness and headaches.  Psychiatric/Behavioral: Negative for agitation and confusion.  All other systems reviewed and are negative.    Physical Exam Updated Vital Signs BP (!) 99/48 (BP Location: Right Arm)   Pulse 65   Temp 98 F (36.7 C) (Oral)   Resp 17   Ht 5' 11"  (1.803 m)   Wt (!) 143.7 kg   SpO2 97%   BMI 44.18 kg/m    Physical Exam  Constitutional: He is oriented to person, place, and time. He appears well-developed and well-nourished. No distress.  HENT:  Head: Normocephalic and atraumatic.  Mouth/Throat: Oropharynx is clear and moist.  Eyes: Conjunctivae and EOM are normal. Right pupil is round and reactive. Left pupil is round and reactive. Pupils are unequal.  Slight anisocoria with left smaller than right.  Neck: Neck supple.  Cardiovascular: Normal rate, regular rhythm and intact distal pulses.  No murmur heard. Pulmonary/Chest: Effort normal and breath sounds normal. No respiratory distress. He has no rales. He exhibits no tenderness.  Abdominal: Soft. There is no tenderness.  Musculoskeletal: He exhibits no edema or tenderness.  Neurological: He is alert and oriented to person, place, and time. He is not disoriented. He displays tremor. No cranial nerve deficit or sensory deficit. He exhibits normal muscle tone. Coordination normal. GCS eye subscore is 4. GCS verbal subscore is 5. GCS motor subscore is 6.  Skin: Skin is warm and dry. He is not diaphoretic. No erythema. No pallor.  Psychiatric: He has a normal mood and affect.  Nursing note and vitals reviewed.    ED Treatments / Results  Labs (all labs ordered are listed, but only abnormal results are displayed) Labs Reviewed  BASIC METABOLIC PANEL - Abnormal; Notable for the following components:      Result Value   Glucose, Bld 135 (*)    Calcium 8.5 (*)    All other components within normal limits  CBC - Abnormal; Notable for the following components:   WBC 2.4 (*)    RBC 3.39 (*)    Hemoglobin 11.0 (*)    HCT 32.8 (*)    Platelets 45 (*)    All other components within normal limits  HEPATIC FUNCTION PANEL - Abnormal; Notable for the following components:   Total Protein 5.7 (*)    Albumin 2.8 (*)    Total Bilirubin 1.6 (*)    Bilirubin, Direct 0.4 (*)    Indirect Bilirubin 1.2 (*)    All other components within normal  limits  AMMONIA - Abnormal; Notable for the following components:   Ammonia 68 (*)    All other components within normal limits  GLUCOSE, CAPILLARY - Abnormal; Notable for the following components:   Glucose-Capillary 238 (*)    All other components within normal limits  CBG MONITORING, ED - Abnormal; Notable for  the following components:   Glucose-Capillary 127 (*)    All other components within normal limits  CBG MONITORING, ED - Abnormal; Notable for the following components:   Glucose-Capillary 196 (*)    All other components within normal limits  URINE CULTURE  URINALYSIS, ROUTINE W REFLEX MICROSCOPIC  PROTIME-INR  MAGNESIUM  BRAIN NATRIURETIC PEPTIDE  BASIC METABOLIC PANEL  HEPATIC FUNCTION PANEL  CBC WITH DIFFERENTIAL/PLATELET  CORTISOL    EKG EKG Interpretation  Date/Time:  Wednesday December 17 2017 15:07:03 EDT Ventricular Rate:  80 PR Interval:    QRS Duration: 139 QT Interval:  466 QTC Calculation: 538 R Axis:   60 Text Interpretation:  Sinus tachycardia Multiform ventricular premature complexes Short PR interval Left bundle branch block When compared to prior, no signifiacnt changes seen.  No STEMI Confirmed by Antony Blackbird [phone]) on 12/17/2017 3:19:30 PM   Radiology Dg Chest 2 View  Result Date: 12/17/2017 CLINICAL DATA:  Cough and hypotension EXAM: CHEST - 2 VIEW COMPARISON:  November 23, 2017 FINDINGS: The heart size and mediastinal contours are stable. The heart size is enlarged. Cardiac pacemaker is unchanged. There is mild diffuse increased bilateral pulmonary interstitium unchanged. The lung volumes are low. There is no focal pneumonia or pleural effusion. The visualized skeletal structures are stable. IMPRESSION: Cardiomegaly with mild interstitial edema. Electronically Signed   By: Abelardo Diesel M.D.   On: 12/17/2017 17:10    Procedures Procedures (including critical care time)  CRITICAL CARE Performed by: Gwenyth Allegra Wesleigh Markovic Total critical  care time: 35 minutes Critical care time was exclusive of separately billable procedures and treating other patients. Critical care was necessary to treat or prevent imminent or life-threatening deterioration. Critical care was time spent personally by me on the following activities: development of treatment plan with patient and/or surrogate as well as nursing, discussions with consultants, evaluation of patient's response to treatment, examination of patient, obtaining history from patient or surrogate, ordering and performing treatments and interventions, ordering and review of laboratory studies, ordering and review of radiographic studies, pulse oximetry and re-evaluation of patient's condition.   Medications Ordered in ED Medications  traMADol (ULTRAM) tablet 50 mg (has no administration in time range)  rifaximin (XIFAXAN) tablet 550 mg (has no administration in time range)  simvastatin (ZOCOR) tablet 10 mg (has no administration in time range)  amitriptyline (ELAVIL) tablet 37.5 mg (has no administration in time range)  memantine (NAMENDA) tablet 20 mg (has no administration in time range)  insulin glargine (LANTUS) injection 35 Units (has no administration in time range)  lactulose (CHRONULAC) 10 GM/15ML solution 15 g (has no administration in time range)  pantoprazole (PROTONIX) EC tablet 40 mg (has no administration in time range)  finasteride (PROSCAR) tablet 5 mg (has no administration in time range)  tamsulosin (FLOMAX) capsule 0.4 mg (has no administration in time range)  clopidogrel (PLAVIX) tablet 75 mg (has no administration in time range)  folic acid (FOLVITE) tablet 1 mg (has no administration in time range)  pregabalin (LYRICA) capsule 300 mg (has no administration in time range)  cholecalciferol (VITAMIN D) tablet 1,000 Units (has no administration in time range)  albuterol (PROVENTIL) (2.5 MG/3ML) 0.083% nebulizer solution 3 mL (has no administration in time range)   acetaminophen (TYLENOL) tablet 650 mg (has no administration in time range)    Or  acetaminophen (TYLENOL) suppository 650 mg (has no administration in time range)  ondansetron (ZOFRAN) tablet 4 mg (has no administration in time range)    Or  ondansetron (ZOFRAN) injection 4 mg (has no administration in time range)  insulin aspart (novoLOG) injection 0-9 Units (has no administration in time range)  sodium chloride 0.9 % bolus 1,000 mL (0 mLs Intravenous Stopped 12/17/17 1753)  sodium chloride 0.9 % bolus 1,000 mL (0 mLs Intravenous Stopped 12/17/17 2211)     Initial Impression / Assessment and Plan / ED Course  I have reviewed the triage vital signs and the nursing notes.  Pertinent labs & imaging results that were available during my care of the patient were reviewed by me and considered in my medical decision making (see chart for details).     Keith [name] is a 77 y.o. male with a past medical history significant for diabetes, GERD, hypertension, hyperlipidemia, liver cancer, cirrhosis, CAD on chronic diuresis, and pacemaker dependence who presents with recurrent syncope.  Patient reports that today after standing up, he got extremely lightheaded and passed out.  Wife reports that she was able to get the patient to the ground so he did not strike his head.  She reports she was passed out for less than 1 minute.  He had no shaking, seizure-like activity, or loss of bowel or bladder control.  She reports he has had a proximal and 5 episodes of this over the last year.  She reports that he was near syncopal yesterday but did not fully pass out.  She says that they have been trying to change his medications including decreasing his diuretic, Lasix, to prevent lightheaded episodes.  According to EMS report to nursing, patient's blood pressure was 66/40 on their arrival.  Patient reports that he had no palpitations, chest pain, or shortness of breath preceding the episode.  Family does  report that he has had a cough for the last week and was recently discharged last week from a rehab facility after admission for generalized weakness.  Patient reports no urinary symptoms, conservation, diarrhea, or recent trauma.  No other complaints on arrival.  Next  On exam, patient has edema in both legs.  Patient has normal sensation and strength in all extremities.  No facial droop or slurred speech.  Patient does have very faint anisocoria with right pupil slightly larger than left.  Patient reports she has had eye surgeries in the past.  Lungs were clear and no significant murmur was appreciated.  Patient's EKG appears similar to prior.  Pacemaker will be interrogated.  No STEMI was seen.    Patient will have screening laboratory testing as well as rehydration initiated.  Patient blood pressure on arrival was in the 90 systolic.  Currently, patient had a high risk syncope with hypotension however as he has had numerous episodes in the past, if patient is rehydrated and no significant abnormalities are discovered, patient may be a candidate for discharge home.    Anticipate reassessment after rehydration and work-up  8:30 PM Patient's diagnostic laboratory testing returned similar or improving from prior.  Patient received 2 L and then had his orthostatics checked.  When he stood up, blood pressure dropped again into the 60s.  Given his syncope and persistent orthostatic hypotension despite rehydration, patient will be admitted for further management.     Final Clinical Impressions(s) / ED Diagnoses   Final diagnoses:  Syncope, unspecified syncope type  Orthostatic hypotension    ED Discharge Orders    None      Clinical Impression: 1. Syncope, unspecified syncope type   2. Orthostatic hypotension     Disposition: Admit  This note was prepared with assistance of Systems analyst. Occasional wrong-word or sound-a-like substitutions may have occurred due to  the inherent limitations of voice recognition software.     Kushi Kun, Gwenyth Allegra, MD 12/18/17 0004    Kamuela Magos, Gwenyth Allegra, MD 12/18/17 0005

## 2017-12-17 NOTE — H&P (Signed)
History and Physical    Keith [name] DVV:[id number] DOB: 1940/07/08 DOA: 12/17/2017  PCP: Haywood Pao, MD  Patient coming from: Home.  Chief Complaint: Weakness.  Near syncope.  HPI: Keith [name] is a 77 y.o. male with history of cirrhosis of the liver, hepatocellular carcinoma, diabetes mellitus type 2, sleep apnea, previous stroke who was recently admitted last month for generalized weakness and was brought to the ER after patient had a near syncopal episode.  Patient states over the last 5 days patient has been feeling weak and when he tries to walk he almost faints.  Denies losing consciousness.  He usually drops blood pressure but has become more prominent.  His gastroenterology has placed his diuretics and nadolol alternate days for now for last 2 weeks.  Denies any chest pain shortness of breath nausea vomiting abdominal pain or diarrhea.  On having near syncopal episode patient denies having hit his head.  ED Course: In the ER patient was given 2 L fluid bolus and on recheck his blood pressure again he was dropping his blood pressure to 60 systolic.  Chest x-ray shows congestion.  EKG shows sinus tachycardia with LBBB which is chronic.  Lab work show pancytopenia.  Ammonia mildly elevated.  Patient's abdomen appears benign.  Patient admitted for near syncope with postural hypotension.  Review of Systems: As per HPI, rest all negative.   Past Medical History:  Diagnosis Date  . Cirrhosis of liver ([place])   . Diabetes mellitus   . Hepatic cancer ([place])   . Prostate hypertrophy   . Stroke ([place])   . Varicose veins   . Weakness of both legs     Past Surgical History:  Procedure Laterality Date  . APPENDECTOMY    . EYE SURGERY     bilateral cataract   . left arm surgery     age 38  . left hand surgery     tendons ripped on left hand  . LIVER RESECTION    . TONSILLECTOMY    . TOTAL KNEE ARTHROPLASTY Left 12/14/2012   Procedure: LEFT TOTAL KNEE ARTHROPLASTY;   Surgeon: Gearlean Alf, MD;  Location: [facility];  Service: Orthopedics;  Laterality: Left;     reports that he has quit smoking. He quit smokeless tobacco use about 34 years ago. He reports that he does not drink alcohol or use drugs.  Allergies  Allergen Reactions  . Fentanyl Itching and Rash  . Iodine Rash  . Merbromin Rash  . Other Rash    OPIODS OPIODS    Family History  Problem Relation Age of Onset  . Stroke Father     Prior to Admission medications   Medication Sig Start Date End Date Taking? Authorizing Provider  albuterol (PROVENTIL HFA;VENTOLIN HFA) 108 (90 BASE) MCG/ACT inhaler Inhale 1-2 puffs into the lungs every 6 (six) hours as needed for wheezing or shortness of breath.   Yes [provider]  amitriptyline (ELAVIL) 25 MG tablet Take 37.5 mg by mouth at bedtime.    Yes [provider]  cholecalciferol (VITAMIN D) 1000 UNITS tablet Take 1,000 Units by mouth daily.   Yes [provider]  clopidogrel (PLAVIX) 75 MG tablet Take 75 mg by mouth daily.   Yes [provider]  cyanocobalamin (,VITAMIN B-12,) 1000 MCG/ML injection Inject 1,000 mcg into the muscle every 30 (thirty) days.   Yes [provider]  finasteride (PROSCAR) 5 MG tablet Take 5 mg by mouth daily.  Yes [provider]  folic acid (FOLVITE) 1 MG tablet Take 1 mg by mouth daily. 02/13/09  Yes [provider]  furosemide (LASIX) 20 MG tablet Take 20 mg by mouth. take 1 tab po every other day for decompensated liver cirrhosis   Yes [provider]  hydrocerin (EUCERIN) CREA Apply 1 application topically daily as needed (dry skin).    Yes [provider]  insulin aspart (NOVOLOG) 100 UNIT/ML injection Inject 5-30 Units into the skin 3 (three) times daily before meals. Per sliding scale.   Yes [provider]  insulin glargine (LANTUS) 100 UNIT/ML injection Inject 42 Units into the skin 2 (two) times daily.    Yes [provider]  lactulose (CHRONULAC) 10 GM/15ML solution Take 22.5 mLs (15 g total) by mouth 3 (three) times daily. Patient taking differently: Take 15 g by mouth 2 (two) times daily.  11/27/17  Yes Shelly Coss, MD  memantine (NAMENDA) 10 MG tablet Take 20 mg by mouth at bedtime.   Yes [provider]  nadolol (CORGARD) 20 MG tablet Take 10 mg by mouth at bedtime. 10/27/17  Yes [provider]  pantoprazole (PROTONIX) 40 MG tablet Take 40 mg by mouth daily.   Yes [provider]  polyvinyl alcohol (LIQUIFILM TEARS) 1.4 % ophthalmic solution Place 1 drop into both eyes 2 (two) times daily as needed (dry eyes).   Yes [provider]  pregabalin (LYRICA) 300 MG capsule Take 300 mg by mouth 2 (two) times daily.   Yes [provider]  rifaximin (XIFAXAN) 550 MG TABS tablet Take 1 tablet (550 mg total) by mouth 2 (two) times daily. 11/27/17  Yes Shelly Coss, MD  simvastatin (ZOCOR) 20 MG tablet Take 10 mg by mouth every evening.   Yes [provider]  spironolactone (ALDACTONE) 25 MG tablet Take 25 mg by mouth every other day.    Yes [provider]  tamsulosin (FLOMAX) 0.4 MG CAPS Take 0.4 mg by mouth daily after supper.    Yes [provider]  traMADol (ULTRAM) 50 MG tablet Take 50 mg by mouth 3 (three) times daily.   Yes [provider]  bisacodyl (DULCOLAX) 10 MG suppository Place 1 suppository (10 mg total) rectally daily as needed. Patient not taking: Reported on 12/17/2017 12/17/12   Dara Lords, Alexzandrew L, PA-C  docusate sodium 100 MG CAPS Take 100 mg by mouth 2 (two) times daily. Patient not taking: Reported on 12/17/2017 12/17/12   Dara Lords, Alexzandrew L, PA-C  iron polysaccharides (NIFEREX) 150 MG capsule Take 1 capsule (150 mg total) by mouth 2 (two) times daily. Patient not taking: Reported on 12/17/2017 12/17/12   Dara Lords, Alexzandrew L, PA-C  methocarbamol (ROBAXIN) 500 MG tablet Take 1 tablet (500 mg total)  by mouth every 6 (six) hours as needed. Patient not taking: Reported on 12/17/2017 12/17/12   Dara Lords, Alexzandrew L, PA-C  metoCLOPramide (REGLAN) 5 MG tablet Take 1-2 tablets (5-10 mg total) by mouth every 8 (eight) hours as needed (if ondansetron (ZOFRAN) ineffective.). Patient not taking: Reported on 12/17/2017 12/17/12   Dara Lords, Alexzandrew L, PA-C  ondansetron (ZOFRAN) 4 MG tablet Take 1 tablet (4 mg total) by mouth every 6 (six) hours as needed for nausea. Patient not taking: Reported on 12/17/2017 12/17/12   Perkins, Alexzandrew L, PA-C  polyethylene glycol (MIRALAX / GLYCOLAX) packet Take 17 g by mouth daily as needed. Patient not taking: Reported on 12/17/2017 12/17/12   Joelene Millin, PA-C  Physical Exam: Vitals:   12/17/17 1915 12/17/17 2000 12/17/17 2030 12/17/17 2200  BP: 109/61 115/64 131/69 124/66  Pulse: 68 71 72 78  Resp: 18  18   Temp:      TempSrc:      SpO2: 97% 95% 100% 95%  Weight:      Height:          Constitutional: Moderately built and nourished. Vitals:   12/17/17 1915 12/17/17 2000 12/17/17 2030 12/17/17 2200  BP: 109/61 115/64 131/69 124/66  Pulse: 68 71 72 78  Resp: 18  18   Temp:      TempSrc:      SpO2: 97% 95% 100% 95%  Weight:      Height:       Eyes: Anicteric no pallor. ENMT: No discharge from the ears eyes nose or mouth. Neck: No mass or.  No neck rigidity but no JVD appreciated. Respiratory: No rhonchi or crepitations. Cardiovascular: S1-S2 heard no murmurs appreciated. Abdomen: Soft nontender bowel sounds present. Musculoskeletal: No edema.  No joint effusion. Skin: No rash. Neurologic: Alert awake oriented to time place and person.  Moves all extremities. Psychiatric: Appears normal per normal affect.   Labs on Admission: I have personally reviewed following labs and imaging studies  CBC: Recent Labs  Lab 12/17/17 1512  WBC 2.4*  HGB 11.0*  HCT 32.8*  MCV 96.8  PLT 45*   Basic Metabolic Panel: Recent Labs  Lab  12/17/17 1512 12/17/17 1518  NA 141  --   K 4.0  --   CL 110  --   CO2 24  --   GLUCOSE 135*  --   BUN 10  --   CREATININE 0.97  --   CALCIUM 8.5*  --   MG  --  1.8   GFR: Estimated Creatinine Clearance: 95.6 mL/min (by C-G formula based on SCr of 0.97 mg/dL). Liver Function Tests: Recent Labs  Lab 12/17/17 1518  AST 39  ALT 24  ALKPHOS 82  BILITOT 1.6*  PROT 5.7*  ALBUMIN 2.8*   No results for input(s): LIPASE, AMYLASE in the last 168 hours. Recent Labs  Lab 12/17/17 1546  AMMONIA 68*   Coagulation Profile: Recent Labs  Lab 12/17/17 1518  INR 1.19   Cardiac Enzymes: No results for input(s): CKTOTAL, CKMB, CKMBINDEX, TROPONINI in the last 168 hours. BNP (last 3 results) No results for input(s): PROBNP in the last 8760 hours. HbA1C: No results for input(s): HGBA1C in the last 72 hours. CBG: Recent Labs  Lab 12/17/17 1523 12/17/17 2203  GLUCAP 127* 196*   Lipid Profile: No results for input(s): CHOL, HDL, LDLCALC, TRIG, CHOLHDL, LDLDIRECT in the last 72 hours. Thyroid Function Tests: No results for input(s): TSH, T4TOTAL, FREET4, T3FREE, THYROIDAB in the last 72 hours. Anemia Panel: No results for input(s): VITAMINB12, FOLATE, FERRITIN, TIBC, IRON, RETICCTPCT in the last 72 hours. Urine analysis:    Component Value Date/Time   COLORURINE YELLOW 12/17/2017 1654   APPEARANCEUR CLEAR 12/17/2017 1654   LABSPEC 1.005 12/17/2017 1654   PHURINE 6.0 12/17/2017 1654   GLUCOSEU NEGATIVE 12/17/2017 1654   HGBUR NEGATIVE 12/17/2017 1654   BILIRUBINUR NEGATIVE 12/17/2017 1654   KETONESUR NEGATIVE 12/17/2017 1654   PROTEINUR NEGATIVE 12/17/2017 1654   UROBILINOGEN 0.2 12/03/2012 1338   NITRITE NEGATIVE 12/17/2017 1654   LEUKOCYTESUR NEGATIVE 12/17/2017 1654   Sepsis Labs: @LABRCNTIP (procalcitonin:4,lacticidven:4) )No results found for this or any previous visit (from the past 240 hour(s)).   Radiological Exams on  Admission: Dg Chest 2 View  Result Date:  12/17/2017 CLINICAL DATA:  Cough and hypotension EXAM: CHEST - 2 VIEW COMPARISON:  November 23, 2017 FINDINGS: The heart size and mediastinal contours are stable. The heart size is enlarged. Cardiac pacemaker is unchanged. There is mild diffuse increased bilateral pulmonary interstitium unchanged. The lung volumes are low. There is no focal pneumonia or pleural effusion. The visualized skeletal structures are stable. IMPRESSION: Cardiomegaly with mild interstitial edema. Electronically Signed   By: Abelardo Diesel M.D.   On: 12/17/2017 17:10    EKG: Independently reviewed.  Sinus tachycardia with LBBB.  Assessment/Plan Principal Problem:   Postural dizziness with near syncope Active Problems:   DM2 (diabetes mellitus, type 2) (HCC)   Cirrhosis of liver (HCC)   OSA (obstructive sleep apnea)   Pacemaker   Hepatic cancer (HCC)   BPH (benign prostatic hyperplasia)   Near syncope    1. Near syncope with postural hypotension -we will hold patient's nadolol and diuretics for now.  Patient did receive 2 L fluid in the ER.  Check orthostatics in the morning get physical therapy consult.  Following telemetry.  Check cortisol level. 2. Diabetes mellitus type 2 -on Lantus insulin dose of which I have decreased slightly while inpatient but on sliding scale coverage. 3. Cirrhosis of the liver being followed by gastroenterologist.  Diuretics on hold.  Ammonia level is mildly elevated for which I have increase patient's lactulose dose.  Recheck ammonia in the morning. 4. History of carcinoma has had last embolization in July 2019.  Being followed by gastroenterologist. 5. Sleep apnea on CPAP. 6. Pancytopenia likely from cirrhosis.  Follow CBC. 7. History of stroke on Plavix and statins. 8. Neuropathy on Lyrica.  9. History of BPH on finasteride and tamsulosin which can be dosed for tomorrow.   DVT prophylaxis: SCDs due to severe thrombocytopenia. Code Status: Full code. Family Communication: No family at  the bedside. Disposition Plan: Home. Consults called: Physical therapy. Admission status: Observation.   Rise Patience MD [facility] Pager [phone].  If 7PM-7AM, please contact night-coverage [email or web address] Password [facility]  12/17/2017, 10:07 PM

## 2017-12-17 NOTE — ED Notes (Signed)
Pt ok to eat, wife brought food from [employer].

## 2017-12-18 DIAGNOSIS — K7460 Unspecified cirrhosis of liver: Secondary | ICD-10-CM | POA: Diagnosis not present

## 2017-12-18 DIAGNOSIS — I951 Orthostatic hypotension: Secondary | ICD-10-CM | POA: Diagnosis not present

## 2017-12-18 DIAGNOSIS — R42 Dizziness and giddiness: Secondary | ICD-10-CM | POA: Diagnosis not present

## 2017-12-18 DIAGNOSIS — G4733 Obstructive sleep apnea (adult) (pediatric): Secondary | ICD-10-CM

## 2017-12-18 DIAGNOSIS — R55 Syncope and collapse: Secondary | ICD-10-CM | POA: Diagnosis not present

## 2017-12-18 LAB — GLUCOSE, CAPILLARY
GLUCOSE-CAPILLARY: 119 mg/dL — AB (ref 70–99)
GLUCOSE-CAPILLARY: 136 mg/dL — AB (ref 70–99)
Glucose-Capillary: 141 mg/dL — ABNORMAL HIGH (ref 70–99)
Glucose-Capillary: 104 mg/dL — ABNORMAL HIGH (ref 70–99)

## 2017-12-18 LAB — CBC WITH DIFFERENTIAL/PLATELET
ABS IMMATURE GRANULOCYTES: 0 10*3/uL (ref 0.0–0.1)
BASOS ABS: 0 10*3/uL (ref 0.0–0.1)
Basophils Relative: 0 %
Eosinophils Absolute: 0.1 10*3/uL (ref 0.0–0.7)
Eosinophils Relative: 3 %
HEMATOCRIT: 31.4 % — AB (ref 39.0–52.0)
HEMOGLOBIN: 10.5 g/dL — AB (ref 13.0–17.0)
Immature Granulocytes: 0 %
LYMPHS ABS: 0.9 10*3/uL (ref 0.7–4.0)
LYMPHS PCT: 40 %
MCH: 32.3 pg (ref 26.0–34.0)
MCHC: 33.4 g/dL (ref 30.0–36.0)
MCV: 96.6 fL (ref 78.0–100.0)
Monocytes Absolute: 0.3 10*3/uL (ref 0.1–1.0)
Monocytes Relative: 11 %
NEUTROS ABS: 1 10*3/uL — AB (ref 1.7–7.7)
Neutrophils Relative %: 46 %
Platelets: 43 10*3/uL — ABNORMAL LOW (ref 150–400)
RBC: 3.25 MIL/uL — AB (ref 4.22–5.81)
RDW: 15.5 % (ref 11.5–15.5)
WBC: 2.2 10*3/uL — ABNORMAL LOW (ref 4.0–10.5)

## 2017-12-18 LAB — HEPATIC FUNCTION PANEL
ALBUMIN: 2.6 g/dL — AB (ref 3.5–5.0)
ALT: 21 U/L (ref 0–44)
AST: 29 U/L (ref 15–41)
Alkaline Phosphatase: 77 U/L (ref 38–126)
BILIRUBIN INDIRECT: 0.8 mg/dL (ref 0.3–0.9)
Bilirubin, Direct: 0.3 mg/dL — ABNORMAL HIGH (ref 0.0–0.2)
TOTAL PROTEIN: 5.4 g/dL — AB (ref 6.5–8.1)
Total Bilirubin: 1.1 mg/dL (ref 0.3–1.2)

## 2017-12-18 LAB — BASIC METABOLIC PANEL
Anion gap: 10 (ref 5–15)
BUN: 8 mg/dL (ref 8–23)
CO2: 24 mmol/L (ref 22–32)
Calcium: 8.4 mg/dL — ABNORMAL LOW (ref 8.9–10.3)
Chloride: 110 mmol/L (ref 98–111)
Creatinine, Ser: 0.93 mg/dL (ref 0.61–1.24)
GFR calc Af Amer: >60 mL/min (ref >60)
Glucose, Bld: 147 mg/dL — ABNORMAL HIGH (ref 70–99)
POTASSIUM: 3.4 mmol/L — AB (ref 3.5–5.1)
SODIUM: 144 mmol/L (ref 135–145)

## 2017-12-18 LAB — URINE CULTURE

## 2017-12-18 LAB — CORTISOL: CORTISOL PLASMA: 3.6 ug/dL

## 2017-12-18 MED ORDER — LACTULOSE 10 GM/15ML PO SOLN: 15.0000 g | Freq: Three times a day (TID) | ORAL | Status: DC

## 2017-12-18 MED ORDER — TRAMADOL HCL 50 MG PO TABS: 50.0000 mg | ORAL_TABLET | Freq: Two times a day (BID) | ORAL | Status: DC | PRN

## 2017-12-18 MED ORDER — LACTULOSE 10 GM/15ML PO SOLN
15.0000 g | Freq: Three times a day (TID) | ORAL | Status: DC
  Administered 2017-12-18 – 2017-12-19 (×4): 15 g via ORAL
  Filled 2017-12-18 (×4): qty 30

## 2017-12-18 MED ORDER — POTASSIUM CHLORIDE CRYS ER 20 MEQ PO TBCR
40.0000 meq | EXTENDED_RELEASE_TABLET | Freq: Once | ORAL | Status: AC
  Administered 2017-12-18: 40 meq via ORAL
  Filled 2017-12-18: qty 2

## 2017-12-18 NOTE — Evaluation (Signed)
Physical Therapy Evaluation Patient Details Name: Keith [name] MRN: [id number] DOB: 07/12/1940 Today's Date: 12/18/2017   History of Present Illness  Patient is a 77 y/o male who presents with syncopal episode and fall at home. Found to have hypotension. PMH includes stroke, DM, Hepatic cancer, cirrhosis of liver.   Clinical Impression  Patient presents with mild balance deficits and hypotension impacting safe mobility. Pt recently d/ced from rehab last Saturday and has been doing really well at home- uses RW for ambulation and wife assists with LB dressing. Tolerated gait training with Min guard assist for safety. No dizziness reported. See orthostatic vitals below: Supine BP 82/56 Sitting BP 104/49 Standing BP 98/75 Sitting post ambulation BP 145/72 Pt and wife aware of safety concerns at home due to orthostatic hypotension. Pt already wears compression socks. Will follow acutely to maximize independence, mobility, balance and ensure safe BP during mobility so pt can return home with wife.    Follow Up Recommendations No PT follow up;Supervision for mobility/OOB    Equipment Recommendations  None recommended by PT    Recommendations for Other Services       Precautions / Restrictions Precautions Precautions: Fall Precaution Comments: orthostatic Restrictions Weight Bearing Restrictions: No      Mobility  Bed Mobility Overal bed mobility: Needs Assistance Bed Mobility: Supine to Sit     Supine to sit: Supervision;HOB elevated     General bed mobility comments: Supervision for safety.   Transfers Overall transfer level: Needs assistance Equipment used: Rolling walker (2 wheeled) Transfers: Sit to/from Stand Sit to Stand: Supervision         General transfer comment: Stood from EOB x2, no assist needed. No dizziness.   Ambulation/Gait Ambulation/Gait assistance: Min guard Gait Distance (Feet): 150 Feet Assistive device: Rolling walker (2 wheeled) Gait  Pattern/deviations: Step-through pattern;Decreased stride length;Trunk flexed Gait velocity: decreased   General Gait Details: Slow, mildly unsteady gait with cues for RW proximity as pt tends to walk with RW too far in front. seated rest break. No dizziness.   Stairs            Wheelchair Mobility    Modified Rankin (Stroke Patients Only)       Balance Overall balance assessment: Needs assistance Sitting-balance support: No upper extremity supported;Feet supported Sitting balance-Leahy Scale: Good     Standing balance support: During functional activity Standing balance-Leahy Scale: Poor Standing balance comment: reliant on RW for balance                             Pertinent Vitals/Pain Pain Assessment: No/denies pain    Home Living Family/patient expects to be discharged to:: Private residence Living Arrangements: Spouse/significant other Available Help at Discharge: Family;Available 24 hours/day Type of Home: House Home Access: Stairs to enter Entrance Stairs-Rails: Right;Left;Can reach both Entrance Stairs-Number of Steps: 2 Home Layout: One level Home Equipment: Grab bars - toilet;Grab bars - tub/shower;Walker - standard;Cane - single point;Other (comment);Walker - 2 wheels      Prior Function Level of Independence: Independent with assistive device(s)         Comments: Uses RW for ambulation. Wife dresses his LB and donns his compression socks. discharged from rehab last Saturday     Hand Dominance   Dominant Hand: Right    Extremity/Trunk Assessment   Upper Extremity Assessment Upper Extremity Assessment: Defer to OT evaluation    Lower Extremity Assessment Lower Extremity Assessment: Generalized weakness;LLE deficits/detail;RLE deficits/detail  RLE Sensation: history of peripheral neuropathy LLE Sensation: history of peripheral neuropathy    Cervical / Trunk Assessment Cervical / Trunk Assessment: Normal  Communication    Communication: HOH  Cognition Arousal/Alertness: Awake/alert Behavior During Therapy: WFL for tasks assessed/performed Overall Cognitive Status: Within Functional Limits for tasks assessed                                 General Comments: seems [facility] for basic tasks.      General Comments General comments (skin integrity, edema, etc.): Wife present during session.    Exercises     Assessment/Plan    PT Assessment Patient needs continued PT services  PT Problem List Decreased mobility;Decreased balance;Cardiopulmonary status limiting activity;Obesity;Impaired sensation;Decreased activity tolerance       PT Treatment Interventions DME instruction;Gait training;Functional mobility training;Therapeutic activities;Therapeutic exercise;Balance training;Neuromuscular re-education;Patient/family education    PT Goals (Current goals can be found in the Care Plan section)  Acute Rehab PT Goals Patient Stated Goal: to go home with my wife and find out whats wrong with my BP PT Goal Formulation: With patient Time For Goal Achievement: 01/01/18 Potential to Achieve Goals: Good    Frequency Min 3X/week   Barriers to discharge        Co-evaluation               AM-PAC PT "6 Clicks" Daily Activity  Outcome Measure Difficulty turning over in bed (including adjusting bedclothes, sheets and blankets)?: None Difficulty moving from lying on back to sitting on the side of the bed? : None Difficulty sitting down on and standing up from a chair with arms (e.g., wheelchair, bedside commode, etc,.)?: A Little Help needed moving to and from a bed to chair (including a wheelchair)?: A Little Help needed walking in hospital room?: A Little Help needed climbing 3-5 steps with a railing? : A Lot 6 Click Score: 19    End of Session Equipment Utilized During Treatment: Gait belt Activity Tolerance: Patient tolerated treatment well Patient left: with family/visitor present;Other  (comment)(sitting on toilet) Nurse Communication: Mobility status PT Visit Diagnosis: Unsteadiness on feet (R26.81);Muscle weakness (generalized) (M62.81);Difficulty in walking, not elsewhere classified (R26.2)    Time: 1859-0931 PT Time Calculation (min) (ACUTE ONLY): 36 min   Charges:   PT Evaluation $PT Eval Moderate Complexity: 1 Mod PT Treatments $Gait Training: 8-22 mins        Wray Kearns, PT, DPT Acute Rehabilitation Services Pager [phone] Office [place] 12/18/2017, 11:04 AM

## 2017-12-18 NOTE — Progress Notes (Signed)
Patient refuses CPAP 

## 2017-12-18 NOTE — Progress Notes (Signed)
Progress Note    Keith [name]  QRF:[id number] DOB: 09/07/1940  DOA: 12/17/2017 PCP: Haywood Pao, MD    Brief Narrative:     Medical records reviewed and are as summarized below:  Keith [name] is an 77 y.o. male with history of cirrhosis of the liver, hepatocellular carcinoma, diabetes mellitus type 2, sleep apnea, previous stroke who was recently admitted last month for generalized weakness and was brought to the ER after patient had a near syncopal episode.  Patient states over the last 5 days patient has been feeling weak and when he tries to walk he almost faints.  Denies losing consciousness.  He usually drops blood pressure but has become more prominent.  His gastroenterology has placed his diuretics and nadolol alternate days for now for last 2 weeks.  Denies any chest pain shortness of breath nausea vomiting abdominal pain or diarrhea.  On having near syncopal episode patient denies having hit his head.  Assessment/Plan:   Principal Problem:   Postural dizziness with near syncope Active Problems:   DM2 (diabetes mellitus, type 2) (HCC)   Cirrhosis of liver (HCC)   OSA (obstructive sleep apnea)   Pacemaker   Hepatic cancer (HCC)   BPH (benign prostatic hyperplasia)   Near syncope   Near syncope with postural hypotension  -hold patient's nadolol and diuretics for now.  -received 2 L fluid in the ER.  -improved orthostatics -TED hose -d/c in AM (suspect wife has component of caregiver fatigue)  Diabetes mellitus type 2  -on Lantus insulin dose of which I have decreased slightly while inpatient  -sliding scale coverage.  Cirrhosis of the liver being followed by gastroenterologist.  Diuretics on hold -had been reduced recently, may need to change to PRN  Sleep apnea -CPAP.  Pancytopenia  -likely from cirrhosis  History of stroke  - Plavix and statins.  Neuropathy  -Lyrica.   History of BPH  - finasteride and tamsulosin -wife has  been giving 2 flomaxes at night  obesity Body mass index is 42.58 kg/m.   Family Communication/Anticipated D/C date and plan/Code Status   DVT prophylaxis: scd Code Status: Full Code.  Family Communication: wife Disposition Plan: home in AM     Subjective:   Feels well, wants to go home  Objective:    Vitals:   12/18/17 0031 12/18/17 0427 12/18/17 0752 12/18/17 0857  BP: (!) 165/98 118/61 (!) 89/50 (!) 90/58  Pulse: 81 62 68   Resp: 18 16 14    Temp: (!) 97.5 F (36.4 C) (!) 97.5 F (36.4 C) 98.3 F (36.8 C)   TempSrc: Axillary Oral    SpO2: 97% 96% 97%   Weight:  134.6 kg    Height:        Intake/Output Summary (Last 24 hours) at 12/18/2017 1235 Last data filed at 12/17/2017 2312 Gross per 24 hour  Intake 2480 ml  Output 950 ml  Net 1530 ml   Filed Weights   12/17/17 1503 12/17/17 2228 12/18/17 0427  Weight: (!) 143.7 kg (!) 140.2 kg 134.6 kg    Exam: In bed, NAD rrr No wheezing No edema, chronic skin changes on b/l LE   Data Reviewed:   I have personally reviewed following labs and imaging studies:  Labs: Labs show the following:   Basic Metabolic Panel: Recent Labs  Lab 12/17/17 1512 12/17/17 1518 12/18/17 0524  NA 141  --  144  K 4.0  --  3.4*  CL 110  --  110  CO2 24  --  24  GLUCOSE 135*  --  147*  BUN 10  --  8  CREATININE 0.97  --  0.93  CALCIUM 8.5*  --  8.4*  MG  --  1.8  --    GFR Estimated Creatinine Clearance: 94.7 mL/min (by C-G formula based on SCr of 0.93 mg/dL). Liver Function Tests: Recent Labs  Lab 12/17/17 1518 12/18/17 0524  AST 39 29  ALT 24 21  ALKPHOS 82 77  BILITOT 1.6* 1.1  PROT 5.7* 5.4*  ALBUMIN 2.8* 2.6*   No results for input(s): LIPASE, AMYLASE in the last 168 hours. Recent Labs  Lab 12/17/17 1546  AMMONIA 68*   Coagulation profile Recent Labs  Lab 12/17/17 1518  INR 1.19    CBC: Recent Labs  Lab 12/17/17 1512 12/18/17 0524  WBC 2.4* 2.2*  NEUTROABS  --  1.0*  HGB 11.0* 10.5*   HCT 32.8* 31.4*  MCV 96.8 96.6  PLT 45* 43*   Cardiac Enzymes: No results for input(s): CKTOTAL, CKMB, CKMBINDEX, TROPONINI in the last 168 hours. BNP (last 3 results) No results for input(s): PROBNP in the last 8760 hours. CBG: Recent Labs  Lab 12/17/17 1523 12/17/17 2203 12/17/17 2339 12/18/17 0701 12/18/17 1128  GLUCAP 127* 196* 238* 119* 136*   D-Dimer: No results for input(s): DDIMER in the last 72 hours. Hgb A1c: No results for input(s): HGBA1C in the last 72 hours. Lipid Profile: No results for input(s): CHOL, HDL, LDLCALC, TRIG, CHOLHDL, LDLDIRECT in the last 72 hours. Thyroid function studies: No results for input(s): TSH, T4TOTAL, T3FREE, THYROIDAB in the last 72 hours.  Invalid input(s): FREET3 Anemia work up: No results for input(s): VITAMINB12, FOLATE, FERRITIN, TIBC, IRON, RETICCTPCT in the last 72 hours. Sepsis Labs: Recent Labs  Lab 12/17/17 1512 12/18/17 0524  WBC 2.4* 2.2*    Microbiology No results found for this or any previous visit (from the past 240 hour(s)).  Procedures and diagnostic studies:  Dg Chest 2 View  Result Date: 12/17/2017 CLINICAL DATA:  Cough and hypotension EXAM: CHEST - 2 VIEW COMPARISON:  November 23, 2017 FINDINGS: The heart size and mediastinal contours are stable. The heart size is enlarged. Cardiac pacemaker is unchanged. There is mild diffuse increased bilateral pulmonary interstitium unchanged. The lung volumes are low. There is no focal pneumonia or pleural effusion. The visualized skeletal structures are stable. IMPRESSION: Cardiomegaly with mild interstitial edema. Electronically Signed   By: Abelardo Diesel M.D.   On: 12/17/2017 17:10    Medications:   . amitriptyline  37.5 mg Oral QHS  . cholecalciferol  1,000 Units Oral Daily  . clopidogrel  75 mg Oral Daily  . finasteride  5 mg Oral Daily  . folic acid  1 mg Oral Daily  . insulin aspart  0-9 Units Subcutaneous TID WC  . insulin glargine  35 Units Subcutaneous  BID  . memantine  20 mg Oral QHS  . pantoprazole  40 mg Oral Daily  . pregabalin  300 mg Oral BID  . rifaximin  550 mg Oral BID  . simvastatin  10 mg Oral QPM  . tamsulosin  0.4 mg Oral QPC supper  . traMADol  50 mg Oral TID   Continuous Infusions:   LOS: 0 days   Geradine Girt  [facility]   *Please refer to [email or web address], password [email or web address] to get updated schedule on who will round on this patient, as hospitalists switch teams weekly. If 7PM-7AM, please contact  night-coverage at [email or web address], password [email or web address] for any overnight needs.  12/18/2017, 12:35 PM

## 2017-12-19 DIAGNOSIS — Z794 Long term (current) use of insulin: Secondary | ICD-10-CM

## 2017-12-19 DIAGNOSIS — E1142 Type 2 diabetes mellitus with diabetic polyneuropathy: Secondary | ICD-10-CM

## 2017-12-19 DIAGNOSIS — R55 Syncope and collapse: Secondary | ICD-10-CM | POA: Diagnosis not present

## 2017-12-19 DIAGNOSIS — R42 Dizziness and giddiness: Secondary | ICD-10-CM | POA: Diagnosis not present

## 2017-12-19 LAB — GLUCOSE, CAPILLARY
GLUCOSE-CAPILLARY: 129 mg/dL — AB (ref 70–99)
Glucose-Capillary: 55 mg/dL — ABNORMAL LOW (ref 70–99)
Glucose-Capillary: 108 mg/dL — ABNORMAL HIGH (ref 70–99)

## 2017-12-19 MED ORDER — SPIRONOLACTONE 25 MG PO TABS: 25.0000 mg | ORAL_TABLET | ORAL | Status: DC | PRN

## 2017-12-19 MED ORDER — TRAMADOL HCL 50 MG PO TABS: 50.0000 mg | ORAL_TABLET | Freq: Two times a day (BID) | ORAL | Status: DC | PRN

## 2017-12-19 MED ORDER — FUROSEMIDE 20 MG PO TABS: 20.0000 mg | ORAL_TABLET | ORAL | Status: DC | PRN

## 2017-12-19 MED ORDER — MIDODRINE HCL 5 MG PO TABS
2.5000 mg | ORAL_TABLET | Freq: Once | ORAL | Status: AC
  Administered 2017-12-19: 2.5 mg via ORAL
  Filled 2017-12-19: qty 1

## 2017-12-19 MED ORDER — LACTULOSE 10 GM/15ML PO SOLN: 15.0000 g | Freq: Two times a day (BID) | ORAL | Status: DC

## 2017-12-19 NOTE — Progress Notes (Signed)
Nsg Discharge Note  Admit Date:  12/17/2017 Discharge date: 12/19/2017   Keith [name] to be D/C'd Home per MD order.  AVS completed.  Copy for chart, and copy for patient signed, and dated. Patient/caregiver able to verbalize understanding.  Discharge Medication: Allergies as of 12/19/2017      Reactions   Fentanyl Itching, Rash   Iodine Rash   Merbromin Rash   Other Rash   OPIODS OPIODS      Medication List    STOP taking these medications   iron polysaccharides 150 MG capsule Commonly known as:  NIFEREX   methocarbamol 500 MG tablet Commonly known as:  ROBAXIN   metoCLOPramide 5 MG tablet Commonly known as:  REGLAN   nadolol 20 MG tablet Commonly known as:  CORGARD   ondansetron 4 MG tablet Commonly known as:  ZOFRAN   rifaximin 550 MG Tabs tablet Commonly known as:  XIFAXAN     TAKE these medications   albuterol 108 (90 Base) MCG/ACT inhaler Commonly known as:  PROVENTIL HFA;VENTOLIN HFA Inhale 1-2 puffs into the lungs every 6 (six) hours as needed for wheezing or shortness of breath.   amitriptyline 25 MG tablet Commonly known as:  ELAVIL Take 37.5 mg by mouth at bedtime.   bisacodyl 10 MG suppository Commonly known as:  DULCOLAX Place 1 suppository (10 mg total) rectally daily as needed.   cholecalciferol 1000 units tablet Commonly known as:  VITAMIN D Take 1,000 Units by mouth daily.   clopidogrel 75 MG tablet Commonly known as:  PLAVIX Take 75 mg by mouth daily.   cyanocobalamin 1000 MCG/ML injection Commonly known as:  (VITAMIN B-12) Inject 1,000 mcg into the muscle every 30 (thirty) days.   DSS 100 MG Caps Take 100 mg by mouth 2 (two) times daily.   finasteride 5 MG tablet Commonly known as:  PROSCAR Take 5 mg by mouth daily.   folic acid 1 MG tablet Commonly known as:  FOLVITE Take 1 mg by mouth daily.   furosemide 20 MG tablet Commonly known as:  LASIX Take 1 tablet (20 mg total) by mouth as needed for fluid or edema. take  1 tab po every other day for decompensated liver cirrhosis What changed:    when to take this  reasons to take this   hydrocerin Crea Apply 1 application topically daily as needed (dry skin).   insulin aspart 100 UNIT/ML injection Commonly known as:  novoLOG Inject 5-30 Units into the skin 3 (three) times daily before meals. Per sliding scale.   insulin glargine 100 UNIT/ML injection Commonly known as:  LANTUS Inject 42 Units into the skin 2 (two) times daily.   lactulose 10 GM/15ML solution Commonly known as:  CHRONULAC Take 22.5 mLs (15 g total) by mouth 2 (two) times daily. TITRATE TO 3 BMs/day What changed:    when to take this  additional instructions   memantine 10 MG tablet Commonly known as:  NAMENDA Take 20 mg by mouth at bedtime.   pantoprazole 40 MG tablet Commonly known as:  PROTONIX Take 40 mg by mouth daily.   polyethylene glycol packet Commonly known as:  MIRALAX / GLYCOLAX Take 17 g by mouth daily as needed.   polyvinyl alcohol 1.4 % ophthalmic solution Commonly known as:  LIQUIFILM TEARS Place 1 drop into both eyes 2 (two) times daily as needed (dry eyes).   pregabalin 300 MG capsule Commonly known as:  LYRICA Take 300 mg by mouth 2 (two) times daily.  simvastatin 20 MG tablet Commonly known as:  ZOCOR Take 10 mg by mouth every evening.   spironolactone 25 MG tablet Commonly known as:  ALDACTONE Take 1 tablet (25 mg total) by mouth as needed (edema-- take with lasix). What changed:    when to take this  reasons to take this   tamsulosin 0.4 MG Caps capsule Commonly known as:  FLOMAX Take 0.4 mg by mouth daily after supper.   traMADol 50 MG tablet Commonly known as:  ULTRAM Take 1 tablet (50 mg total) by mouth every 12 (twelve) hours as needed for moderate pain. What changed:    when to take this  reasons to take this       Discharge Assessment: Vitals:   12/19/17 1043 12/19/17 1132  BP:  122/76  Pulse:  77  Resp:  20   Temp:    SpO2: 98% 98%   Skin clean, dry and intact without evidence of skin break down, no evidence of skin tears noted. IV catheter discontinued intact. Site without signs and symptoms of complications - no redness or edema noted at insertion site, patient denies c/o pain - only slight tenderness at site.  Dressing with slight pressure applied.  D/c Instructions-Education: Discharge instructions given to patient/family with verbalized understanding. D/c education completed with patient/family including follow up instructions, medication list, d/c activities limitations if indicated, with other d/c instructions as indicated by MD - patient able to verbalize understanding, all questions fully answered. Patient instructed to return to ED, call 911, or call MD for any changes in condition.  Patient escorted via [place], and D/C home via private auto.  Eda Keys, RN 12/19/2017 12:36 PM

## 2017-12-19 NOTE — Discharge Instructions (Signed)
Hypotension As your heart beats, it forces blood through your body. This force is called blood pressure. If you have hypotension, you have low blood pressure. When your blood pressure is too low, you may not get enough blood to your brain. You may feel weak, feel light-headed, have a fast heartbeat, or even pass out (faint). Follow these instructions at home: Eating and drinking  Drink enough fluids to keep your pee (urine) clear or pale yellow.  Eat a healthy diet, and follow instructions from your doctor about eating or drinking restrictions. A healthy diet includes: ? Fresh fruits and vegetables. ? Whole grains. ? Low-fat (lean) meats. ? Low-fat dairy products.  Eat extra salt only as told. Do not add extra salt to your diet unless your doctor tells you to.  Eat small meals often.  Avoid standing up quickly after you eat. Medicines  Take over-the-counter and prescription medicines only as told by your doctor. ? Follow instructions from your doctor about changing how much you take (the dosage) of your medicines, if this applies. ? Do not stop or change your medicine on your own. General instructions  Wear compression stockings as told by your doctor.  Get up slowly from lying down or sitting.  Avoid hot showers and a lot of heat as told by your doctor.  Return to your normal activities as told by your doctor. Ask what activities are safe for you.  Do not use any products that contain nicotine or tobacco, such as cigarettes and e-cigarettes. If you need help quitting, ask your doctor.  Keep all follow-up visits as told by your doctor. This is important. Contact a doctor if:  You throw up (vomit).  You have watery poop (diarrhea).  You have a fever for more than 2-3 days.  You feel more thirsty than normal.  You feel weak and tired. Get help right away if:  You have chest pain.  You have a fast or irregular heartbeat.  You lose feeling (get numbness) in any part  of your body.  You cannot move your arms or your legs.  You have trouble talking.  You get sweaty or feel light-headed.  You faint.  You have trouble breathing.  You have trouble staying awake.  You feel confused. This information is not intended to replace advice given to you by your health care provider. Make sure you discuss any questions you have with your health care provider. Document Released: 06/19/2009 Document Revised: 12/12/2015 Document Reviewed: 12/12/2015 Elsevier Interactive Patient Education  2017 [employer].

## 2017-12-19 NOTE — Care Management Note (Signed)
Case Management Note  Patient Details  Name: Keith [name] MRN: [id number] Date of Birth: 09-18-1940  Subjective/Objective:       Pt in with postural dizziness and near syncope. He is from home with his spouse. Per pt and spouse the [place] has arranged some HH PT/OT. CM encouraged them to reach out to the [place] CM if they have not heard from the [place] about he services by Tuesday.              Action/Plan: No /fu per PT and no DME. Wife to provide supervision at home and transportation to home.   Expected Discharge Date:  12/19/17               Expected Discharge Plan:  Home/Self Care  In-House Referral:     Discharge planning Services  CM Consult  Post Acute Care Choice:    Choice offered to:     DME Arranged:    DME Agency:     HH Arranged:    HH Agency:     Status of Service:  Completed, signed off  If discussed at [employer] of Stay Meetings, dates discussed:    Additional Comments:  Pollie Friar, RN 12/19/2017, 10:50 AM

## 2017-12-19 NOTE — Progress Notes (Signed)
CBG 55, no symptoms, administered orange juice.

## 2017-12-19 NOTE — Progress Notes (Signed)
Notified MD of pt's orthostatic vitals.

## 2017-12-19 NOTE — Discharge Summary (Signed)
Physician Discharge Summary  EILAN MCINERNY FVC:[id number] DOB: Jan 16, 1941 DOA: 12/17/2017  PCP: Haywood Pao, MD  Admit date: 12/17/2017 Discharge date: 12/19/2017  Admitted From: home Discharge disposition: home   Recommendations for Outpatient Follow-Up:   1. Home health PT 2. Holding diuretic to use PRN 3. Holding BB 4. May need off prostate medications 5. TED hose   Discharge Diagnosis:   Principal Problem:   Postural dizziness with near syncope Active Problems:   DM2 (diabetes mellitus, type 2) (HCC)   Cirrhosis of liver (HCC)   OSA (obstructive sleep apnea)   Pacemaker   Hepatic cancer (HCC)   BPH (benign prostatic hyperplasia)   Near syncope   Orthostatic hypotension    Discharge Condition: Improved.  Diet recommendation: Low sodium, heart healthy.  Carbohydrate-modified  Wound care: None.  Code status: Full.   History of Present Illness:   Keith [name] is a 77 y.o. male with history of cirrhosis of the liver, hepatocellular carcinoma, diabetes mellitus type 2, sleep apnea, previous stroke who was recently admitted last month for generalized weakness and was brought to the ER after patient had a near syncopal episode.  Patient states over the last 5 days patient has been feeling weak and when he tries to walk he almost faints.  Denies losing consciousness.  He usually drops blood pressure but has become more prominent.  His gastroenterology has placed his diuretics and nadolol alternate days for now for last 2 weeks.  Denies any chest pain shortness of breath nausea vomiting abdominal pain or diarrhea.  On having near syncopal episode patient denies having hit his head.   Hospital Course by Problem:   Near syncope with postural hypotensionand neuropathy -hold patient's nadolol and diuretics- -received 2 L fluid in the ER.  -improved orthostatics-- is not symptomatic -support stockings -home health  Diabetes mellitus type  2 -continue home meds  Cirrhosis of the liver being followed by gastroenterologist -had been reduced recently,  change to PRN  Sleep apnea -CPAP.  Pancytopenia  -likely from cirrhosis  History of stroke  - Plavix and statins.  Neuropathy  -Lyrica.  History of BPH  - finasteride and tamsulosin -wife has been giving 2 flomaxes at night  obesity Body mass index is 42.58 kg/m.    Medical Consultants:      Discharge Exam:   Vitals:   12/19/17 1043 12/19/17 1132  BP:  122/76  Pulse:  77  Resp:  20  Temp:    SpO2: 98% 98%   Vitals:   12/19/17 0409 12/19/17 0826 12/19/17 1043 12/19/17 1132  BP: 105/66 123/76  122/76  Pulse: 66 76  77  Resp: 18 20  20   Temp: 97.6 F (36.4 C) 98 F (36.7 C)    TempSrc: Oral Oral    SpO2: 99% 99% 98% 98%  Weight:      Height:        General exam: Appears calm and comfortable. Ready to go home  The results of significant diagnostics from this hospitalization (including imaging, microbiology, ancillary and laboratory) are listed below for reference.     Procedures and Diagnostic Studies:   Dg Chest 2 View  Result Date: 12/17/2017 CLINICAL DATA:  Cough and hypotension EXAM: CHEST - 2 VIEW COMPARISON:  November 23, 2017 FINDINGS: The heart size and mediastinal contours are stable. The heart size is enlarged. Cardiac pacemaker is unchanged. There is mild diffuse increased bilateral pulmonary interstitium unchanged. The lung volumes are low.  There is no focal pneumonia or pleural effusion. The visualized skeletal structures are stable. IMPRESSION: Cardiomegaly with mild interstitial edema. Electronically Signed   By: Abelardo Diesel M.D.   On: 12/17/2017 17:10     Labs:   Basic Metabolic Panel: Recent Labs  Lab 12/17/17 1512 12/17/17 1518 12/18/17 0524  NA 141  --  144  K 4.0  --  3.4*  CL 110  --  110  CO2 24  --  24  GLUCOSE 135*  --  147*  BUN 10  --  8  CREATININE 0.97  --  0.93  CALCIUM 8.5*  --  8.4*  MG   --  1.8  --    GFR Estimated Creatinine Clearance: 94.7 mL/min (by C-G formula based on SCr of 0.93 mg/dL). Liver Function Tests: Recent Labs  Lab 12/17/17 1518 12/18/17 0524  AST 39 29  ALT 24 21  ALKPHOS 82 77  BILITOT 1.6* 1.1  PROT 5.7* 5.4*  ALBUMIN 2.8* 2.6*   No results for input(s): LIPASE, AMYLASE in the last 168 hours. Recent Labs  Lab 12/17/17 1546  AMMONIA 68*   Coagulation profile Recent Labs  Lab 12/17/17 1518  INR 1.19    CBC: Recent Labs  Lab 12/17/17 1512 12/18/17 0524  WBC 2.4* 2.2*  NEUTROABS  --  1.0*  HGB 11.0* 10.5*  HCT 32.8* 31.4*  MCV 96.8 96.6  PLT 45* 43*   Cardiac Enzymes: No results for input(s): CKTOTAL, CKMB, CKMBINDEX, TROPONINI in the last 168 hours. BNP: Invalid input(s): POCBNP CBG: Recent Labs  Lab 12/18/17 1711 12/18/17 2153 12/19/17 0619 12/19/17 0737 12/19/17 1129  GLUCAP 141* 104* 55* 108* 129*   D-Dimer No results for input(s): DDIMER in the last 72 hours. Hgb A1c No results for input(s): HGBA1C in the last 72 hours. Lipid Profile No results for input(s): CHOL, HDL, LDLCALC, TRIG, CHOLHDL, LDLDIRECT in the last 72 hours. Thyroid function studies No results for input(s): TSH, T4TOTAL, T3FREE, THYROIDAB in the last 72 hours.  Invalid input(s): FREET3 Anemia work up No results for input(s): VITAMINB12, FOLATE, FERRITIN, TIBC, IRON, RETICCTPCT in the last 72 hours. Microbiology Recent Results (from the past 240 hour(s))  Urine culture     Status: Abnormal   Collection Time: 12/17/17  4:54 PM  Result Value Ref Range Status   Specimen Description URINE, CLEAN CATCH  Final   Special Requests NONE  Final   Culture (A)  Final    <10,000 COLONIES/mL INSIGNIFICANT GROWTH Performed at [facility], [address] [place], [place] [address]    Report Status 12/18/2017 FINAL  Final     Discharge Instructions:   Discharge Instructions    Diet - low sodium heart healthy   Complete by:  As directed      Discharge instructions   Complete by:  As directed    Home health PT Continue to remind him about safe walker usage, not letting it get so far in front of him   Increase activity slowly   Complete by:  As directed      Allergies as of 12/19/2017      Reactions   Fentanyl Itching, Rash   Iodine Rash   Merbromin Rash   Other Rash   OPIODS OPIODS      Medication List    STOP taking these medications   iron polysaccharides 150 MG capsule Commonly known as:  NIFEREX   methocarbamol 500 MG tablet Commonly known as:  ROBAXIN  metoCLOPramide 5 MG tablet Commonly known as:  REGLAN   nadolol 20 MG tablet Commonly known as:  CORGARD   ondansetron 4 MG tablet Commonly known as:  ZOFRAN   rifaximin 550 MG Tabs tablet Commonly known as:  XIFAXAN     TAKE these medications   albuterol 108 (90 Base) MCG/ACT inhaler Commonly known as:  PROVENTIL HFA;VENTOLIN HFA Inhale 1-2 puffs into the lungs every 6 (six) hours as needed for wheezing or shortness of breath.   amitriptyline 25 MG tablet Commonly known as:  ELAVIL Take 37.5 mg by mouth at bedtime.   bisacodyl 10 MG suppository Commonly known as:  DULCOLAX Place 1 suppository (10 mg total) rectally daily as needed.   cholecalciferol 1000 units tablet Commonly known as:  VITAMIN D Take 1,000 Units by mouth daily.   clopidogrel 75 MG tablet Commonly known as:  PLAVIX Take 75 mg by mouth daily.   cyanocobalamin 1000 MCG/ML injection Commonly known as:  (VITAMIN B-12) Inject 1,000 mcg into the muscle every 30 (thirty) days.   DSS 100 MG Caps Take 100 mg by mouth 2 (two) times daily.   finasteride 5 MG tablet Commonly known as:  PROSCAR Take 5 mg by mouth daily.   folic acid 1 MG tablet Commonly known as:  FOLVITE Take 1 mg by mouth daily.   furosemide 20 MG tablet Commonly known as:  LASIX Take 1 tablet (20 mg total) by mouth as needed for fluid or edema. take 1 tab po every other day for decompensated liver  cirrhosis What changed:    when to take this  reasons to take this   hydrocerin Crea Apply 1 application topically daily as needed (dry skin).   insulin aspart 100 UNIT/ML injection Commonly known as:  novoLOG Inject 5-30 Units into the skin 3 (three) times daily before meals. Per sliding scale.   insulin glargine 100 UNIT/ML injection Commonly known as:  LANTUS Inject 42 Units into the skin 2 (two) times daily.   lactulose 10 GM/15ML solution Commonly known as:  CHRONULAC Take 22.5 mLs (15 g total) by mouth 2 (two) times daily. TITRATE TO 3 BMs/day What changed:    when to take this  additional instructions   memantine 10 MG tablet Commonly known as:  NAMENDA Take 20 mg by mouth at bedtime.   pantoprazole 40 MG tablet Commonly known as:  PROTONIX Take 40 mg by mouth daily.   polyethylene glycol packet Commonly known as:  MIRALAX / GLYCOLAX Take 17 g by mouth daily as needed.   polyvinyl alcohol 1.4 % ophthalmic solution Commonly known as:  LIQUIFILM TEARS Place 1 drop into both eyes 2 (two) times daily as needed (dry eyes).   pregabalin 300 MG capsule Commonly known as:  LYRICA Take 300 mg by mouth 2 (two) times daily.   simvastatin 20 MG tablet Commonly known as:  ZOCOR Take 10 mg by mouth every evening.   spironolactone 25 MG tablet Commonly known as:  ALDACTONE Take 1 tablet (25 mg total) by mouth as needed (edema-- take with lasix). What changed:    when to take this  reasons to take this   tamsulosin 0.4 MG Caps capsule Commonly known as:  FLOMAX Take 0.4 mg by mouth daily after supper.   traMADol 50 MG tablet Commonly known as:  ULTRAM Take 1 tablet (50 mg total) by mouth every 12 (twelve) hours as needed for moderate pain. What changed:    when to take this  reasons  to take this      Follow-up Information    Tisovec, Fransico Him, MD Follow up in 1 week(s).   Specialty:  Internal Medicine Contact information: [address] [place] [address] [phone]            Time coordinating discharge: 25 min  Signed:  Geradine Girt  [facility] 12/19/2017, 12:34 PM

## 2018-01-22 DIAGNOSIS — K766 Portal hypertension: Secondary | ICD-10-CM | POA: Diagnosis not present

## 2018-01-22 DIAGNOSIS — Z950 Presence of cardiac pacemaker: Secondary | ICD-10-CM | POA: Diagnosis not present

## 2018-01-22 DIAGNOSIS — C220 Liver cell carcinoma: Secondary | ICD-10-CM | POA: Diagnosis not present

## 2018-01-22 DIAGNOSIS — I81 Portal vein thrombosis: Secondary | ICD-10-CM | POA: Diagnosis not present

## 2018-02-04 DIAGNOSIS — I350 Nonrheumatic aortic (valve) stenosis: Secondary | ICD-10-CM | POA: Diagnosis not present

## 2018-02-04 DIAGNOSIS — E7800 Pure hypercholesterolemia, unspecified: Secondary | ICD-10-CM | POA: Diagnosis not present

## 2018-02-04 DIAGNOSIS — I5032 Chronic diastolic (congestive) heart failure: Secondary | ICD-10-CM | POA: Diagnosis not present

## 2018-02-18 DIAGNOSIS — C8298 Follicular lymphoma, unspecified, lymph nodes of multiple sites: Secondary | ICD-10-CM | POA: Diagnosis not present

## 2018-02-25 DIAGNOSIS — K7689 Other specified diseases of liver: Secondary | ICD-10-CM | POA: Diagnosis not present

## 2018-03-03 DIAGNOSIS — Z794 Long term (current) use of insulin: Secondary | ICD-10-CM | POA: Diagnosis not present

## 2018-03-03 DIAGNOSIS — C8590 Non-Hodgkin lymphoma, unspecified, unspecified site: Secondary | ICD-10-CM | POA: Diagnosis not present

## 2018-03-03 DIAGNOSIS — K769 Liver disease, unspecified: Secondary | ICD-10-CM | POA: Diagnosis not present

## 2018-03-03 DIAGNOSIS — E11319 Type 2 diabetes mellitus with unspecified diabetic retinopathy without macular edema: Secondary | ICD-10-CM | POA: Diagnosis not present

## 2018-03-12 DIAGNOSIS — Z45018 Encounter for adjustment and management of other part of cardiac pacemaker: Secondary | ICD-10-CM | POA: Diagnosis not present

## 2018-03-12 DIAGNOSIS — I5032 Chronic diastolic (congestive) heart failure: Secondary | ICD-10-CM | POA: Diagnosis not present

## 2018-03-12 DIAGNOSIS — I4439 Other atrioventricular block: Secondary | ICD-10-CM | POA: Diagnosis not present

## 2018-03-12 DIAGNOSIS — I442 Atrioventricular block, complete: Secondary | ICD-10-CM | POA: Diagnosis not present

## 2018-04-14 ENCOUNTER — Other Ambulatory Visit: Payer: Self-pay

## 2018-04-14 ENCOUNTER — Emergency Department (HOSPITAL_COMMUNITY): Payer: No Typology Code available for payment source

## 2018-04-14 ENCOUNTER — Inpatient Hospital Stay (HOSPITAL_COMMUNITY)
Admission: EM | Admit: 2018-04-14 | Discharge: 2018-04-20 | DRG: 287 | Disposition: A | Discharge status: Home Health | Payer: No Typology Code available for payment source | Attending: Family Medicine | Admitting: Family Medicine

## 2018-04-14 ENCOUNTER — Encounter (HOSPITAL_COMMUNITY): Payer: Self-pay | Admitting: Emergency Medicine

## 2018-04-14 DIAGNOSIS — E1142 Type 2 diabetes mellitus with diabetic polyneuropathy: Secondary | ICD-10-CM | POA: Diagnosis present

## 2018-04-14 DIAGNOSIS — N400 Enlarged prostate without lower urinary tract symptoms: Secondary | ICD-10-CM | POA: Diagnosis present

## 2018-04-14 DIAGNOSIS — Z79899 Other long term (current) drug therapy: Secondary | ICD-10-CM

## 2018-04-14 DIAGNOSIS — I878 Other specified disorders of veins: Secondary | ICD-10-CM | POA: Diagnosis present

## 2018-04-14 DIAGNOSIS — Z8249 Family history of ischemic heart disease and other diseases of the circulatory system: Secondary | ICD-10-CM | POA: Diagnosis not present

## 2018-04-14 DIAGNOSIS — J810 Acute pulmonary edema: Secondary | ICD-10-CM | POA: Diagnosis not present

## 2018-04-14 DIAGNOSIS — I5033 Acute on chronic diastolic (congestive) heart failure: Secondary | ICD-10-CM

## 2018-04-14 DIAGNOSIS — I420 Dilated cardiomyopathy: Secondary | ICD-10-CM | POA: Diagnosis present

## 2018-04-14 DIAGNOSIS — Z823 Family history of stroke: Secondary | ICD-10-CM | POA: Diagnosis not present

## 2018-04-14 DIAGNOSIS — C220 Liver cell carcinoma: Secondary | ICD-10-CM | POA: Diagnosis not present

## 2018-04-14 DIAGNOSIS — E119 Type 2 diabetes mellitus without complications: Secondary | ICD-10-CM | POA: Diagnosis not present

## 2018-04-14 DIAGNOSIS — Z6841 Body Mass Index (BMI) 40.0 and over, adult: Secondary | ICD-10-CM | POA: Diagnosis not present

## 2018-04-14 DIAGNOSIS — E1169 Type 2 diabetes mellitus with other specified complication: Secondary | ICD-10-CM | POA: Diagnosis present

## 2018-04-14 DIAGNOSIS — Z9221 Personal history of antineoplastic chemotherapy: Secondary | ICD-10-CM

## 2018-04-14 DIAGNOSIS — I10 Essential (primary) hypertension: Secondary | ICD-10-CM

## 2018-04-14 DIAGNOSIS — R0602 Shortness of breath: Secondary | ICD-10-CM | POA: Diagnosis not present

## 2018-04-14 DIAGNOSIS — Z9119 Patient's noncompliance with other medical treatment and regimen: Secondary | ICD-10-CM | POA: Diagnosis not present

## 2018-04-14 DIAGNOSIS — Z8505 Personal history of malignant neoplasm of liver: Secondary | ICD-10-CM | POA: Diagnosis not present

## 2018-04-14 DIAGNOSIS — T464X5A Adverse effect of angiotensin-converting-enzyme inhibitors, initial encounter: Secondary | ICD-10-CM | POA: Diagnosis not present

## 2018-04-14 DIAGNOSIS — R0603 Acute respiratory distress: Secondary | ICD-10-CM | POA: Diagnosis present

## 2018-04-14 DIAGNOSIS — Z66 Do not resuscitate: Secondary | ICD-10-CM | POA: Diagnosis present

## 2018-04-14 DIAGNOSIS — K7460 Unspecified cirrhosis of liver: Secondary | ICD-10-CM | POA: Diagnosis present

## 2018-04-14 DIAGNOSIS — C8290 Follicular lymphoma, unspecified, unspecified site: Secondary | ICD-10-CM | POA: Diagnosis not present

## 2018-04-14 DIAGNOSIS — Z950 Presence of cardiac pacemaker: Secondary | ICD-10-CM | POA: Diagnosis not present

## 2018-04-14 DIAGNOSIS — Z87891 Personal history of nicotine dependence: Secondary | ICD-10-CM

## 2018-04-14 DIAGNOSIS — I110 Hypertensive heart disease with heart failure: Principal | ICD-10-CM | POA: Diagnosis present

## 2018-04-14 DIAGNOSIS — Z794 Long term (current) use of insulin: Secondary | ICD-10-CM

## 2018-04-14 DIAGNOSIS — E669 Obesity, unspecified: Secondary | ICD-10-CM

## 2018-04-14 DIAGNOSIS — I442 Atrioventricular block, complete: Secondary | ICD-10-CM | POA: Diagnosis present

## 2018-04-14 DIAGNOSIS — N179 Acute kidney failure, unspecified: Secondary | ICD-10-CM | POA: Diagnosis not present

## 2018-04-14 DIAGNOSIS — R05 Cough: Secondary | ICD-10-CM | POA: Diagnosis not present

## 2018-04-14 DIAGNOSIS — Z8673 Personal history of transient ischemic attack (TIA), and cerebral infarction without residual deficits: Secondary | ICD-10-CM

## 2018-04-14 DIAGNOSIS — I5043 Acute on chronic combined systolic (congestive) and diastolic (congestive) heart failure: Secondary | ICD-10-CM | POA: Diagnosis not present

## 2018-04-14 DIAGNOSIS — E785 Hyperlipidemia, unspecified: Secondary | ICD-10-CM | POA: Diagnosis not present

## 2018-04-14 DIAGNOSIS — R0789 Other chest pain: Secondary | ICD-10-CM | POA: Diagnosis not present

## 2018-04-14 DIAGNOSIS — E662 Morbid (severe) obesity with alveolar hypoventilation: Secondary | ICD-10-CM | POA: Diagnosis present

## 2018-04-14 DIAGNOSIS — G4733 Obstructive sleep apnea (adult) (pediatric): Secondary | ICD-10-CM | POA: Diagnosis not present

## 2018-04-14 DIAGNOSIS — I952 Hypotension due to drugs: Secondary | ICD-10-CM | POA: Diagnosis not present

## 2018-04-14 DIAGNOSIS — C229 Malignant neoplasm of liver, not specified as primary or secondary: Secondary | ICD-10-CM | POA: Diagnosis present

## 2018-04-14 DIAGNOSIS — I350 Nonrheumatic aortic (valve) stenosis: Secondary | ICD-10-CM | POA: Diagnosis present

## 2018-04-14 DIAGNOSIS — I441 Atrioventricular block, second degree: Secondary | ICD-10-CM | POA: Diagnosis present

## 2018-04-14 DIAGNOSIS — I5041 Acute combined systolic (congestive) and diastolic (congestive) heart failure: Secondary | ICD-10-CM

## 2018-04-14 DIAGNOSIS — Z7901 Long term (current) use of anticoagulants: Secondary | ICD-10-CM

## 2018-04-14 DIAGNOSIS — I4430 Unspecified atrioventricular block: Secondary | ICD-10-CM

## 2018-04-14 DIAGNOSIS — K7581 Nonalcoholic steatohepatitis (NASH): Secondary | ICD-10-CM | POA: Diagnosis present

## 2018-04-14 DIAGNOSIS — Z96652 Presence of left artificial knee joint: Secondary | ICD-10-CM | POA: Diagnosis not present

## 2018-04-14 DIAGNOSIS — D6959 Other secondary thrombocytopenia: Secondary | ICD-10-CM | POA: Diagnosis present

## 2018-04-14 DIAGNOSIS — I872 Venous insufficiency (chronic) (peripheral): Secondary | ICD-10-CM | POA: Diagnosis present

## 2018-04-14 DIAGNOSIS — Z7902 Long term (current) use of antithrombotics/antiplatelets: Secondary | ICD-10-CM

## 2018-04-14 DIAGNOSIS — I272 Pulmonary hypertension, unspecified: Secondary | ICD-10-CM | POA: Diagnosis present

## 2018-04-14 DIAGNOSIS — D649 Anemia, unspecified: Secondary | ICD-10-CM | POA: Diagnosis present

## 2018-04-14 LAB — CBC
HCT: 37.8 % — ABNORMAL LOW (ref 39.0–52.0)
HEMOGLOBIN: 11.8 g/dL — AB (ref 13.0–17.0)
MCH: 31.2 pg (ref 26.0–34.0)
MCHC: 31.2 g/dL (ref 30.0–36.0)
MCV: 100 fL (ref 80.0–100.0)
Platelets: 75 10*3/uL — ABNORMAL LOW (ref 150–400)
RBC: 3.78 MIL/uL — ABNORMAL LOW (ref 4.22–5.81)
RDW: 15.3 % (ref 11.5–15.5)
WBC: 3.5 10*3/uL — AB (ref 4.0–10.5)
nRBC: 0 % (ref 0.0–0.2)

## 2018-04-14 LAB — CBG MONITORING, ED
Glucose-Capillary: 149 mg/dL — ABNORMAL HIGH (ref 70–99)
Glucose-Capillary: 155 mg/dL — ABNORMAL HIGH (ref 70–99)

## 2018-04-14 LAB — BASIC METABOLIC PANEL
Anion gap: 8 (ref 5–15)
BUN: 13 mg/dL (ref 8–23)
CO2: 24 mmol/L (ref 22–32)
Calcium: 8.7 mg/dL — ABNORMAL LOW (ref 8.9–10.3)
Chloride: 107 mmol/L (ref 98–111)
Creatinine, Ser: 0.94 mg/dL (ref 0.61–1.24)
GFR calc Af Amer: >60 mL/min (ref >60)
Glucose, Bld: 146 mg/dL — ABNORMAL HIGH (ref 70–99)
POTASSIUM: 4.1 mmol/L (ref 3.5–5.1)
SODIUM: 139 mmol/L (ref 135–145)

## 2018-04-14 LAB — I-STAT TROPONIN, ED: Troponin i, poc: 0.03 ng/mL (ref 0.00–0.08)

## 2018-04-14 LAB — BRAIN NATRIURETIC PEPTIDE: B Natriuretic Peptide: 435.8 pg/mL — ABNORMAL HIGH (ref 0.0–100.0)

## 2018-04-14 MED ORDER — TAMSULOSIN HCL 0.4 MG PO CAPS
0.4000 mg | ORAL_CAPSULE | Freq: Every day | ORAL | Status: DC
  Administered 2018-04-14 – 2018-04-19 (×6): 0.4 mg via ORAL
  Filled 2018-04-14 (×6): qty 1

## 2018-04-14 MED ORDER — LISINOPRIL 5 MG PO TABS
2.5000 mg | ORAL_TABLET | Freq: Every day | ORAL | Status: DC
  Administered 2018-04-15 – 2018-04-18 (×2): 2.5 mg via ORAL
  Filled 2018-04-14 (×2): qty 1

## 2018-04-14 MED ORDER — ASPIRIN EC 81 MG PO TBEC
81.0000 mg | DELAYED_RELEASE_TABLET | Freq: Every day | ORAL | Status: DC
  Administered 2018-04-14 – 2018-04-20 (×5): 81 mg via ORAL
  Filled 2018-04-14 (×5): qty 1

## 2018-04-14 MED ORDER — CLOPIDOGREL BISULFATE 75 MG PO TABS
75.0000 mg | ORAL_TABLET | Freq: Every day | ORAL | Status: DC
  Administered 2018-04-15 – 2018-04-20 (×4): 75 mg via ORAL
  Filled 2018-04-14 (×4): qty 1

## 2018-04-14 MED ORDER — ENOXAPARIN SODIUM 40 MG/0.4ML ~~LOC~~ SOLN
40.0000 mg | SUBCUTANEOUS | Status: DC
  Administered 2018-04-15 – 2018-04-20 (×4): 40 mg via SUBCUTANEOUS
  Filled 2018-04-14 (×5): qty 0.4

## 2018-04-14 MED ORDER — SODIUM CHLORIDE 0.9 % IV SOLN: 250.0000 mL | INTRAVENOUS | Status: DC | PRN

## 2018-04-14 MED ORDER — ALBUTEROL SULFATE (2.5 MG/3ML) 0.083% IN NEBU
5.0000 mg | INHALATION_SOLUTION | Freq: Once | RESPIRATORY_TRACT | Status: AC
  Administered 2018-04-14: 5 mg via RESPIRATORY_TRACT
  Filled 2018-04-14: qty 6

## 2018-04-14 MED ORDER — INSULIN ASPART 100 UNIT/ML ~~LOC~~ SOLN
0.0000 [IU] | Freq: Three times a day (TID) | SUBCUTANEOUS | Status: DC
  Administered 2018-04-15: 3 [IU] via SUBCUTANEOUS
  Administered 2018-04-15: 5 [IU] via SUBCUTANEOUS
  Administered 2018-04-16 (×2): 3 [IU] via SUBCUTANEOUS
  Administered 2018-04-17: 8 [IU] via SUBCUTANEOUS
  Administered 2018-04-17: 3 [IU] via SUBCUTANEOUS
  Administered 2018-04-18 (×3): 5 [IU] via SUBCUTANEOUS

## 2018-04-14 MED ORDER — LACTULOSE 10 GM/15ML PO SOLN
15.0000 g | Freq: Two times a day (BID) | ORAL | Status: DC
  Administered 2018-04-18 – 2018-04-19 (×2): 15 g via ORAL
  Filled 2018-04-14 (×7): qty 30

## 2018-04-14 MED ORDER — MEMANTINE HCL 10 MG PO TABS
20.0000 mg | ORAL_TABLET | Freq: Every day | ORAL | Status: DC
  Administered 2018-04-14 – 2018-04-19 (×6): 20 mg via ORAL
  Filled 2018-04-14 (×6): qty 2

## 2018-04-14 MED ORDER — AMITRIPTYLINE HCL 75 MG PO TABS
37.5000 mg | ORAL_TABLET | Freq: Every day | ORAL | Status: DC
  Administered 2018-04-14: 25 mg via ORAL
  Administered 2018-04-15 – 2018-04-18 (×4): 37.5 mg via ORAL
  Filled 2018-04-14 (×4): qty 1
  Filled 2018-04-14: qty 2
  Filled 2018-04-14: qty 1

## 2018-04-14 MED ORDER — INSULIN ASPART 100 UNIT/ML ~~LOC~~ SOLN
0.0000 [IU] | Freq: Every day | SUBCUTANEOUS | Status: DC
  Administered 2018-04-17: 3 [IU] via SUBCUTANEOUS

## 2018-04-14 MED ORDER — INSULIN GLARGINE 100 UNIT/ML ~~LOC~~ SOLN
42.0000 [IU] | Freq: Two times a day (BID) | SUBCUTANEOUS | Status: DC
  Administered 2018-04-15 – 2018-04-20 (×10): 42 [IU] via SUBCUTANEOUS
  Filled 2018-04-14 (×13): qty 0.42

## 2018-04-14 MED ORDER — PREGABALIN 100 MG PO CAPS
300.0000 mg | ORAL_CAPSULE | Freq: Two times a day (BID) | ORAL | Status: DC
  Administered 2018-04-14 – 2018-04-20 (×10): 300 mg via ORAL
  Filled 2018-04-14 (×2): qty 3
  Filled 2018-04-14: qty 6
  Filled 2018-04-14 (×7): qty 3

## 2018-04-14 MED ORDER — FUROSEMIDE 10 MG/ML IJ SOLN
40.0000 mg | Freq: Once | INTRAMUSCULAR | Status: AC
  Administered 2018-04-14: 40 mg via INTRAVENOUS
  Filled 2018-04-14: qty 4

## 2018-04-14 MED ORDER — TRAMADOL HCL 50 MG PO TABS
50.0000 mg | ORAL_TABLET | Freq: Two times a day (BID) | ORAL | Status: DC | PRN
  Administered 2018-04-15 – 2018-04-20 (×7): 50 mg via ORAL
  Filled 2018-04-14 (×7): qty 1

## 2018-04-14 MED ORDER — PANTOPRAZOLE SODIUM 40 MG PO TBEC
40.0000 mg | DELAYED_RELEASE_TABLET | Freq: Every day | ORAL | Status: DC
  Administered 2018-04-15 – 2018-04-20 (×4): 40 mg via ORAL
  Filled 2018-04-14 (×4): qty 1

## 2018-04-14 MED ORDER — FOLIC ACID 1 MG PO TABS
1.0000 mg | ORAL_TABLET | Freq: Every day | ORAL | Status: DC
  Administered 2018-04-15 – 2018-04-20 (×4): 1 mg via ORAL
  Filled 2018-04-14 (×5): qty 1

## 2018-04-14 MED ORDER — SODIUM CHLORIDE 0.9% FLUSH: 3.0000 mL | INTRAVENOUS | Status: DC | PRN

## 2018-04-14 MED ORDER — FUROSEMIDE 10 MG/ML IJ SOLN
20.0000 mg | Freq: Two times a day (BID) | INTRAMUSCULAR | Status: DC
  Administered 2018-04-14 – 2018-04-15 (×2): 20 mg via INTRAVENOUS
  Filled 2018-04-14: qty 4
  Filled 2018-04-14: qty 2

## 2018-04-14 MED ORDER — SODIUM CHLORIDE 0.9% FLUSH
3.0000 mL | Freq: Two times a day (BID) | INTRAVENOUS | Status: DC
  Administered 2018-04-14 – 2018-04-19 (×6): 3 mL via INTRAVENOUS

## 2018-04-14 MED ORDER — ONDANSETRON HCL 4 MG/2ML IJ SOLN: 4.0000 mg | Freq: Four times a day (QID) | INTRAMUSCULAR | Status: DC | PRN

## 2018-04-14 MED ORDER — SIMVASTATIN 20 MG PO TABS
10.0000 mg | ORAL_TABLET | Freq: Every evening | ORAL | Status: DC
  Administered 2018-04-14 – 2018-04-19 (×6): 10 mg via ORAL
  Filled 2018-04-14 (×6): qty 1

## 2018-04-14 MED ORDER — ACETAMINOPHEN 325 MG PO TABS
650.0000 mg | ORAL_TABLET | ORAL | Status: DC | PRN
  Administered 2018-04-19: 650 mg via ORAL
  Filled 2018-04-14: qty 2

## 2018-04-14 MED ORDER — FINASTERIDE 5 MG PO TABS
5.0000 mg | ORAL_TABLET | Freq: Every day | ORAL | Status: DC
  Administered 2018-04-15 – 2018-04-20 (×4): 5 mg via ORAL
  Filled 2018-04-14 (×5): qty 1

## 2018-04-14 MED ORDER — FUROSEMIDE 10 MG/ML IJ SOLN: 40.0000 mg | Freq: Two times a day (BID) | INTRAMUSCULAR | Status: DC

## 2018-04-14 NOTE — ED Provider Notes (Signed)
[facility] EMERGENCY DEPARTMENT Provider Note   CSN: [id number] Arrival date & time: 04/14/18  1109     History   Chief Complaint Chief Complaint  Patient presents with  . Shortness of Breath    HPI Keith [name] is a 78 y.o. male.  HPI  78 year old male presents with shortness of breath.  Started 2 days ago and has progressively worsened.  At first it was minimal with some dry cough.  Yesterday the shortness of breath was much worse and he had some wheezing.  He tried albuterol and it did not help.  Today the shortness of breath is not as bad but still present, especially on exertion.  No fevers, chest pain, abdominal pain or distention.  He has chronic leg swelling that he thinks is about baseline.  Past Medical History:  Diagnosis Date  . Cirrhosis of liver ([place])   . Diabetes mellitus   . Hepatic cancer ([place])   . Prostate hypertrophy   . Stroke ([place])   . Varicose veins   . Weakness of both legs     Patient Active Problem List   Diagnosis Date Noted  . Orthostatic hypotension   . Syncope 12/17/2017  . Postural dizziness with near syncope 12/17/2017  . Near syncope 12/17/2017  . Increased ammonia level 11/30/2017  . Hepatic cancer ([place]) 11/30/2017  . Dementia ([place]) 11/30/2017  . BPH (benign prostatic hyperplasia) 11/30/2017  . Coronary artery disease 11/30/2017  . Chronic anticoagulation 11/30/2017  . Weakness 11/23/2017  . Cirrhosis of liver ([place]) 11/23/2017  . OSA (obstructive sleep apnea) 11/23/2017  . Pacemaker 11/23/2017  . Varicose veins of left lower extremity with complications 74/16/3845  . Knee pain, acute 01/26/2013  . Constipation 12/22/2012  . Neuropathy 12/22/2012  . Prostate hypertrophy 12/22/2012  . Acute blood loss anemia 12/22/2012  . Hyperlipidemia 12/22/2012  . DM2 (diabetes mellitus, type 2) ([place]) 12/17/2012  . GERD (gastroesophageal reflux disease) 12/17/2012  . Hypertension 12/17/2012  . Thrombocytopenia,  unspecified ([place]) 12/17/2012  . Postoperative anemia due to acute blood loss 12/15/2012  . Hyponatremia 12/15/2012  . OA (osteoarthritis) of knee 12/14/2012    Past Surgical History:  Procedure Laterality Date  . APPENDECTOMY    . EYE SURGERY     bilateral cataract   . left arm surgery     age 68  . left hand surgery     tendons ripped on left hand  . LIVER RESECTION    . TONSILLECTOMY    . TOTAL KNEE ARTHROPLASTY Left 12/14/2012   Procedure: LEFT TOTAL KNEE ARTHROPLASTY;  Surgeon: Gearlean Alf, MD;  Location: [facility]S;  Service: Orthopedics;  Laterality: Left;        Home Medications    Prior to Admission medications   Medication Sig Start Date End Date Taking? Authorizing Provider  albuterol (PROVENTIL HFA;VENTOLIN HFA) 108 (90 BASE) MCG/ACT inhaler Inhale 1-2 puffs into the lungs every 6 (six) hours as needed for wheezing or shortness of breath.    [provider]  amitriptyline (ELAVIL) 25 MG tablet Take 37.5 mg by mouth at bedtime.     [provider]  bisacodyl (DULCOLAX) 10 MG suppository Place 1 suppository (10 mg total) rectally daily as needed. Patient not taking: Reported on 12/17/2017 12/17/12   Dara Lords, Alexzandrew L, PA-C  cholecalciferol (VITAMIN D) 1000 UNITS tablet Take 1,000 Units by mouth daily.    [provider]  clopidogrel (PLAVIX) 75 MG tablet Take 75 mg by mouth daily.  [provider]  cyanocobalamin (,VITAMIN B-12,) 1000 MCG/ML injection Inject 1,000 mcg into the muscle every 30 (thirty) days.    [provider]  docusate sodium 100 MG CAPS Take 100 mg by mouth 2 (two) times daily. Patient not taking: Reported on 12/17/2017 12/17/12   Dara Lords, Alexzandrew L, PA-C  finasteride (PROSCAR) 5 MG tablet Take 5 mg by mouth daily.      [provider]  folic acid (FOLVITE) 1 MG tablet Take 1 mg by mouth daily. 02/13/09   [provider]  furosemide (LASIX) 20 MG tablet Take 1 tablet (20 mg total) by  mouth as needed for fluid or edema. take 1 tab po every other day for decompensated liver cirrhosis 12/19/17   Geradine Girt, DO  hydrocerin (EUCERIN) CREA Apply 1 application topically daily as needed (dry skin).     [provider]  insulin aspart (NOVOLOG) 100 UNIT/ML injection Inject 5-30 Units into the skin 3 (three) times daily before meals. Per sliding scale.    [provider]  insulin glargine (LANTUS) 100 UNIT/ML injection Inject 42 Units into the skin 2 (two) times daily.     [provider]  lactulose (CHRONULAC) 10 GM/15ML solution Take 22.5 mLs (15 g total) by mouth 2 (two) times daily. TITRATE TO 3 BMs/day 12/19/17   Geradine Girt, DO  memantine (NAMENDA) 10 MG tablet Take 20 mg by mouth at bedtime.    [provider]  pantoprazole (PROTONIX) 40 MG tablet Take 40 mg by mouth daily.    [provider]  polyethylene glycol (MIRALAX / GLYCOLAX) packet Take 17 g by mouth daily as needed. Patient not taking: Reported on 12/17/2017 12/17/12   Dara Lords, Alexzandrew L, PA-C  polyvinyl alcohol (LIQUIFILM TEARS) 1.4 % ophthalmic solution Place 1 drop into both eyes 2 (two) times daily as needed (dry eyes).    [provider]  pregabalin (LYRICA) 300 MG capsule Take 300 mg by mouth 2 (two) times daily.    [provider]  simvastatin (ZOCOR) 20 MG tablet Take 10 mg by mouth every evening.    [provider]  spironolactone (ALDACTONE) 25 MG tablet Take 1 tablet (25 mg total) by mouth as needed (edema-- take with lasix). 12/19/17   Geradine Girt, DO  tamsulosin (FLOMAX) 0.4 MG CAPS Take 0.4 mg by mouth daily after supper.     [provider]  traMADol (ULTRAM) 50 MG tablet Take 1 tablet (50 mg total) by mouth every 12 (twelve) hours as needed for moderate pain. 12/19/17   Geradine Girt, DO    Family History Family History  Problem Relation Age of Onset  . Stroke Father     Social History Social History    Tobacco Use  . Smoking status: Former Research scientist (life sciences)  . Smokeless tobacco: Former Systems developer    Quit date: 11/20/1983  Substance Use Topics  . Alcohol use: No    Alcohol/week: 0.0 standard drinks  . Drug use: No     Allergies   Fentanyl; Iodine; Merbromin; and Other   Review of Systems Review of Systems  Constitutional: Negative for fever.  Respiratory: Positive for cough and shortness of breath.   Cardiovascular: Positive for leg swelling. Negative for chest pain.  Gastrointestinal: Negative for abdominal distention, abdominal pain and vomiting.  All other systems reviewed and are negative.    Physical Exam Updated Vital Signs BP 124/80 (BP Location: Left Arm)   Pulse 99   Temp 97.6 F (  36.4 C) (Oral)   Resp 19   Ht 5' 10.5" (1.791 m)   Wt (!) 142.9 kg   SpO2 96%   BMI 44.56 kg/m   Physical Exam Vitals signs and nursing note reviewed.  Constitutional:      General: He is not in acute distress.    Appearance: He is well-developed. He is obese. He is not ill-appearing or diaphoretic.  HENT:     Head: Normocephalic and atraumatic.     Right Ear: External ear normal.     Left Ear: External ear normal.     Nose: Nose normal.  Eyes:     General:        Right eye: No discharge.        Left eye: No discharge.  Neck:     Musculoskeletal: Neck supple.  Cardiovascular:     Rate and Rhythm: Normal rate and regular rhythm.     Heart sounds: Murmur present.  Pulmonary:     Effort: Pulmonary effort is normal. No tachypnea, accessory muscle usage or respiratory distress.     Breath sounds: Examination of the right-lower field reveals rales. Examination of the left-lower field reveals rales. Rales present.  Abdominal:     Palpations: Abdomen is soft.     Tenderness: There is no abdominal tenderness.  Musculoskeletal:     Right lower leg: Edema present.     Left lower leg: Edema present.     Comments: Mild pitting edema in lower legs  Skin:    General: Skin is warm and dry.   Neurological:     Mental Status: He is alert.  Psychiatric:        Mood and Affect: Mood is not anxious.      ED Treatments / Results  Labs (all labs ordered are listed, but only abnormal results are displayed) Labs Reviewed  BASIC METABOLIC PANEL - Abnormal; Notable for the following components:      Result Value   Glucose, Bld 146 (*)    Calcium 8.7 (*)    All other components within normal limits  CBC - Abnormal; Notable for the following components:   WBC 3.5 (*)    RBC 3.78 (*)    Hemoglobin 11.8 (*)    HCT 37.8 (*)    Platelets 75 (*)    All other components within normal limits  BRAIN NATRIURETIC PEPTIDE - Abnormal; Notable for the following components:   B Natriuretic Peptide 435.8 (*)    All other components within normal limits  CBG MONITORING, ED - Abnormal; Notable for the following components:   Glucose-Capillary 149 (*)    All other components within normal limits  I-STAT TROPONIN, ED    EKG EKG Interpretation  Date/Time:  Tuesday April 14 2018 11:13:30 EST Ventricular Rate:  95 PR Interval:  184 QRS Duration: 114 QT Interval:  396 QTC Calculation: 497 R Axis:   83 Text Interpretation:  Sinus rhythm with occasional Premature ventricular complexes Anteroseptal infarct , age undetermined ST & T wave abnormality, consider inferior ischemia Abnormal ECG No STEMI. Similar to prior.  Confirmed by Nanda Quinton [phone]) on 04/14/2018 11:16:41 AM   Radiology Dg Chest 2 View  Result Date: 04/14/2018 CLINICAL DATA:  Cough and shortness of breath. EXAM: CHEST - 2 VIEW COMPARISON:  12/17/2017 FINDINGS: The heart size and pulmonary vascularity are normal. Aortic atherosclerosis. Pacemaker in place. There are new minimal bilateral pleural effusions. There is increased peribronchial thickening and interstitial accentuation bilaterally without consolidative infiltrates. No  acute bone abnormality. IMPRESSION: 1. New minimal bilateral pleural effusions. 2. Increased  bronchitic changes. 3.  Aortic Atherosclerosis (ICD10-I70.0). Electronically Signed   By: Lorriane Shire M.D.   On: 04/14/2018 11:40    Procedures Procedures (including critical care time)  Medications Ordered in ED Medications  furosemide (LASIX) injection 40 mg (has no administration in time range)  albuterol (PROVENTIL) (2.5 MG/3ML) 0.083% nebulizer solution 5 mg (5 mg Nebulization Given 04/14/18 1356)     Initial Impression / Assessment and Plan / ED Course  I have reviewed the triage vital signs and the nursing notes.  Pertinent labs & imaging results that were available during my care of the patient were reviewed by me and considered in my medical decision making (see chart for details).     Patient had maybe a little bit of relief with some albuterol.  However I think with the elevated BNP, some peripheral edema that is acute on chronic, and the pleural effusions, though small, on the chest x-ray that this is probably CHF/fluid overload.  He was recently taken off/had a reduced dose of his Lasix due to hypotension caused by diuresis a few months back.  I think he will need IV diuresis and given these tachypneic at rest and does get a little worse with some borderline low sats of 90% on ambulation, I think he should come into the hospital. Dr. Lorin Mercy to admit.  Final Clinical Impressions(s) / ED Diagnoses   Final diagnoses:  Acute pulmonary edema [facility])    ED Discharge Orders    None       Sherwood Gambler, MD 04/14/18 [phone]

## 2018-04-14 NOTE — ED Triage Notes (Signed)
Pt reporting shortness of breath with wheezing since yesterday. Denies chest pain, has a pacemaker.

## 2018-04-14 NOTE — H&P (Signed)
History and Physical    Keith [name]:[id number] DOB: September 14, 1940 DOA: 04/14/2018  PCP: Haywood Pao, MD; [place] [facility] Consultants:  [place] - cardiology; Reita Cliche - oncology Patient coming from:  Home - lives with wife; NOK: Wife, [phone]  Chief Complaint: SOB  HPI: Keith [name] is a 78 y.o. male with medical history significant of CVA; hepatic CA; cirrhosis of the liver; and DM presenting with SOB.  He went to the [place] today for a routine appointment.  Yesterday, he had SOB and was wheezing - this was new for him.  The wheezing worsened through the day and was present throughout the night.  They went to the [place] and to the ER there.  They sent him to [facility] because they weren't able to care for him with the problem he had.  No chest pain.  He has chronic edema, no recent change.  No weight change.  No orthopnea.  No PND, including last night.  His Lasix was changed to 1/2 strength (20 mg) and now every other day instead of daily, changed some months ago because "I was collapsing and they finally got me in the hospital here and found out that I had a urinary tract infection".    ED Course:  Fluid overload, ?CHF.  No h/o CHF but on Lasix QOD (decreased from daily due to orthostasis).  Elevated BNP.  Minimal ambulation.  Obs, echo recommended.  Review of Systems: As per HPI; otherwise review of systems reviewed and negative.   Ambulatory Status:  Ambulates with a walker  Past Medical History:  Diagnosis Date  . Cirrhosis of liver (HCC)    NASH  . Diabetes mellitus   . Hepatic cancer ([place])   . Prostate hypertrophy   . Stroke ([place])   . Varicose veins   . Weakness of both legs     Past Surgical History:  Procedure Laterality Date  . APPENDECTOMY    . EYE SURGERY     bilateral cataract   . left arm surgery     age 34  . left hand surgery     tendons ripped on left hand  . LIVER RESECTION    . TONSILLECTOMY    . TOTAL KNEE ARTHROPLASTY Left 12/14/2012   Procedure: LEFT TOTAL KNEE ARTHROPLASTY;  Surgeon: Gearlean Alf, MD;  Location: [facility];  Service: Orthopedics;  Laterality: Left;    Social History   Socioeconomic History  . Marital status: Married    Spouse name: Not on file  . Number of children: Not on file  . Years of education: Not on file  . Highest education level: Not on file  Occupational History  . Occupation: retired  Scientific laboratory technician  . Financial resource strain: Not on file  . Food insecurity:    Worry: Not on file    Inability: Not on file  . Transportation needs:    Medical: Not on file    Non-medical: Not on file  Tobacco Use  . Smoking status: Former Smoker    Last attempt to quit: 1970    Years since quitting: 50.0  . Smokeless tobacco: Former Systems developer    Quit date: 11/20/1983  Substance and Sexual Activity  . Alcohol use: No    Alcohol/week: 0.0 standard drinks  . Drug use: No  . Sexual activity: Not Currently  Lifestyle  . Physical activity:    Days per week: Not on file    Minutes per session: Not on  file  . Stress: Not on file  Relationships  . Social connections:    Talks on phone: Not on file    Gets together: Not on file    Attends religious service: Not on file    Active member of club or organization: Not on file    Attends meetings of clubs or organizations: Not on file    Relationship status: Not on file  . Intimate partner violence:    Fear of current or ex partner: Not on file    Emotionally abused: Not on file    Physically abused: Not on file    Forced sexual activity: Not on file  Other Topics Concern  . Not on file  Social History Narrative  . Not on file    Allergies  Allergen Reactions  . Fentanyl Itching and Rash  . Iodine Rash  . Merbromin Rash  . Other Rash    OPIODS OPIODS    Family History  Problem Relation Age of Onset  . Stroke Father   . Heart failure Brother 12       Identical twin    Prior to Admission medications   Medication Sig Start Date End Date  Taking? Authorizing Provider  albuterol (PROVENTIL HFA;VENTOLIN HFA) 108 (90 BASE) MCG/ACT inhaler Inhale 1-2 puffs into the lungs every 6 (six) hours as needed for wheezing or shortness of breath.    [provider]  amitriptyline (ELAVIL) 25 MG tablet Take 37.5 mg by mouth at bedtime.     [provider]  bisacodyl (DULCOLAX) 10 MG suppository Place 1 suppository (10 mg total) rectally daily as needed. Patient not taking: Reported on 12/17/2017 12/17/12   Dara Lords, Alexzandrew L, PA-C  cholecalciferol (VITAMIN D) 1000 UNITS tablet Take 1,000 Units by mouth daily.    [provider]  clopidogrel (PLAVIX) 75 MG tablet Take 75 mg by mouth daily.    [provider]  cyanocobalamin (,VITAMIN B-12,) 1000 MCG/ML injection Inject 1,000 mcg into the muscle every 30 (thirty) days.    [provider]  docusate sodium 100 MG CAPS Take 100 mg by mouth 2 (two) times daily. Patient not taking: Reported on 12/17/2017 12/17/12   Dara Lords, Alexzandrew L, PA-C  finasteride (PROSCAR) 5 MG tablet Take 5 mg by mouth daily.      [provider]  folic acid (FOLVITE) 1 MG tablet Take 1 mg by mouth daily. 02/13/09   [provider]  furosemide (LASIX) 20 MG tablet Take 1 tablet (20 mg total) by mouth as needed for fluid or edema. take 1 tab po every other day for decompensated liver cirrhosis 12/19/17   Geradine Girt, DO  hydrocerin (EUCERIN) CREA Apply 1 application topically daily as needed (dry skin).     [provider]  insulin aspart (NOVOLOG) 100 UNIT/ML injection Inject 5-30 Units into the skin 3 (three) times daily before meals. Per sliding scale.    [provider]  insulin glargine (LANTUS) 100 UNIT/ML injection Inject 42 Units into the skin 2 (two) times daily.     [provider]  lactulose (CHRONULAC) 10 GM/15ML solution Take 22.5 mLs (15 g total) by mouth 2 (two) times daily. TITRATE TO 3 BMs/day 12/19/17   Geradine Girt, DO   memantine (NAMENDA) 10 MG tablet Take 20 mg by mouth at bedtime.    [provider]  pantoprazole (PROTONIX) 40 MG tablet Take 40 mg by mouth daily.    [provider]  polyethylene glycol (  MIRALAX / GLYCOLAX) packet Take 17 g by mouth daily as needed. Patient not taking: Reported on 12/17/2017 12/17/12   Dara Lords, Alexzandrew L, PA-C  polyvinyl alcohol (LIQUIFILM TEARS) 1.4 % ophthalmic solution Place 1 drop into both eyes 2 (two) times daily as needed (dry eyes).    [provider]  pregabalin (LYRICA) 300 MG capsule Take 300 mg by mouth 2 (two) times daily.    [provider]  simvastatin (ZOCOR) 20 MG tablet Take 10 mg by mouth every evening.    [provider]  spironolactone (ALDACTONE) 25 MG tablet Take 1 tablet (25 mg total) by mouth as needed (edema-- take with lasix). 12/19/17   Geradine Girt, DO  tamsulosin (FLOMAX) 0.4 MG CAPS Take 0.4 mg by mouth daily after supper.     [provider]  traMADol (ULTRAM) 50 MG tablet Take 1 tablet (50 mg total) by mouth every 12 (twelve) hours as needed for moderate pain. 12/19/17   Geradine Girt, DO    Physical Exam: Vitals:   04/14/18 1330 04/14/18 1415 04/14/18 1604 04/14/18 1658  BP: 130/87 108/80 124/80 125/77  Pulse: 96 97 99 98  Resp: 20 18 19 20   Temp:      TempSrc:      SpO2: 96% 99% 96% 95%  Weight:      Height:         General: Appears calm and comfortable and is NAD; he is quite conversant and not dyspneic with conversation Eyes:  PERRL, EOMI, normal lids, iris ENT:  grossly normal hearing, lips & tongue, mmm Neck:  no LAD, masses or thyromegaly; no carotid bruits Cardiovascular:  RRR, r/g, 9-2/4 systolic murmur. 2+ LE edema.  Respiratory:   CTA bilaterally with no wheezes/rales/rhonchi.  Normal respiratory effort.  He has difficulty lifting himself and moving in the bed due to his habitus. Abdomen:  soft, NT, ND, NABS, morbidly obese Back:   normal alignment, no  CVAT Skin:  no rash or induration seen on limited exam; hyperpigmentation of B LE, chronic Musculoskeletal:  grossly normal tone BUE/BLE, good ROM, no bony abnormality Psychiatric:  grossly normal mood and affect, speech fluent and appropriate, AOx3 Neurologic:  CN 2-12 grossly intact, moves all extremities in coordinated fashion, sensation intact    Radiological Exams on Admission: Dg Chest 2 View  Result Date: 04/14/2018 CLINICAL DATA:  Cough and shortness of breath. EXAM: CHEST - 2 VIEW COMPARISON:  12/17/2017 FINDINGS: The heart size and pulmonary vascularity are normal. Aortic atherosclerosis. Pacemaker in place. There are new minimal bilateral pleural effusions. There is increased peribronchial thickening and interstitial accentuation bilaterally without consolidative infiltrates. No acute bone abnormality. IMPRESSION: 1. New minimal bilateral pleural effusions. 2. Increased bronchitic changes. 3.  Aortic Atherosclerosis (ICD10-I70.0). Electronically Signed   By: Lorriane Shire M.D.   On: 04/14/2018 11:40    EKG: Independently reviewed.  NSR with rate 95; nonspecific ST changes with no evidence of acute ischemia   Labs on Admission: I have personally reviewed the available labs and imaging studies at the time of the admission.  Pertinent labs:   Glucose 146 BNP 435.8; 93.8 in 9/19 Troponin 0.03 WBC 3.5 - stable Hgb 11.8 - stable Platelets 75 - stable   Assessment/Plan Principal Problem:   Acute exacerbation of CHF (congestive heart failure) (HCC) Active Problems:   Hypertension   Hyperlipidemia   Cirrhosis of liver (HCC)   OSA (obstructive sleep apnea)   Pacemaker   Hepatic cancer ([place])   Diabetes  mellitus type 2 in obese (HCC)   Obesity, Class III, BMI 40-49.9 (morbid obesity) ([place])   Possible new-onset CHF -Patient presenting with acute respiratory difficulty, but not hypoxia  -He is morbidly obese and OHS may be contributing -However, he appears to be mildly  volume overloaded with edema on exam -CXR not overly consistent with pulmonary edema -Normal WBC count -Elevated BNP -Concern for new-onset CHF -Will place in observation status with telemetry -Will request echocardiogram  -Will start ASA -Will start Lisinopril 2.5 mg daily (BP is normal but he has reported h/o orthostasis so will start with very low dose) -No beta blocker due to h/o hypotension on BB -CHF order set utilized; may need CHF team consult but will hold until Echo results are available -Was given Lasix 40 mg x 1 in ER and will repeat with 20 mg BID for now -Continue [place] O2 for now -Normal kidney function at this time, will follow -Repeat EKG in AM -Has pacemaker  HTN -Off nadolol due to h/o hypotension -Start low-dose ACE, as above  HLD -Continue Zocor -Lipids were checked in 10/19 (TC 126, HDL 45, LDL 69, TG 59) so will not repeat at this time  DM -Last A1c was 6.6 in 8/19 -Continue Lantus 42 units BID -Will cover with moderate-scale SSI for now  Cirrhosis/hepatic CA -s/p repeated embolization treatments for recurrent HCC -He is supposed to be taking Rifaximin to manage hepatic encephalopathy but this is not clearly listed on his MAR at this time (med rec has not been completed) - will order after med rec if appropriate -On Plavix due to progressive clotting of the portal/mesenteric vasculature  OSA -Continue CPAP -Patient is relatively noncompliant with this at home and this may be contributing to symptoms  Morbid obesity -Likely has a component of OHS that is contributing to symptoms -He appears to be relatively sedentary -PT consult   DVT prophylaxis: Lovenox  Code Status:  DNR - confirmed with patient Family Communication: None present Disposition Plan:  Home once clinically improved Consults called: CM/PT Admission status: It is my clinical opinion that referral for OBSERVATION is reasonable and necessary in this patient based on the above  information provided. The aforementioned taken together are felt to place the patient at high risk for further clinical deterioration. However it is anticipated that the patient may be medically stable for discharge from the hospital within 24 to 48 hours.     Karmen Bongo MD [facility]  If note is complete, please contact covering daytime or nighttime physician. [email or web address] Password [facility]  04/14/2018, 5:26 PM

## 2018-04-14 NOTE — ED Notes (Signed)
CBG 149.

## 2018-04-14 NOTE — ED Notes (Signed)
Attempted report 

## 2018-04-14 NOTE — ED Notes (Signed)
Walked about 30 feet with patient , pt 02 saturations maintained above 91% ; pt denies any increased SOB with ambulation

## 2018-04-15 ENCOUNTER — Observation Stay (HOSPITAL_COMMUNITY): Payer: No Typology Code available for payment source

## 2018-04-15 DIAGNOSIS — R0603 Acute respiratory distress: Secondary | ICD-10-CM

## 2018-04-15 DIAGNOSIS — E662 Morbid (severe) obesity with alveolar hypoventilation: Secondary | ICD-10-CM | POA: Diagnosis present

## 2018-04-15 DIAGNOSIS — K7581 Nonalcoholic steatohepatitis (NASH): Secondary | ICD-10-CM | POA: Diagnosis present

## 2018-04-15 DIAGNOSIS — I110 Hypertensive heart disease with heart failure: Secondary | ICD-10-CM | POA: Diagnosis present

## 2018-04-15 DIAGNOSIS — C8290 Follicular lymphoma, unspecified, unspecified site: Secondary | ICD-10-CM | POA: Diagnosis present

## 2018-04-15 DIAGNOSIS — Z79899 Other long term (current) drug therapy: Secondary | ICD-10-CM | POA: Diagnosis not present

## 2018-04-15 DIAGNOSIS — I350 Nonrheumatic aortic (valve) stenosis: Secondary | ICD-10-CM

## 2018-04-15 DIAGNOSIS — Z9119 Patient's noncompliance with other medical treatment and regimen: Secondary | ICD-10-CM | POA: Diagnosis not present

## 2018-04-15 DIAGNOSIS — I5043 Acute on chronic combined systolic (congestive) and diastolic (congestive) heart failure: Secondary | ICD-10-CM | POA: Diagnosis present

## 2018-04-15 DIAGNOSIS — E785 Hyperlipidemia, unspecified: Secondary | ICD-10-CM | POA: Diagnosis present

## 2018-04-15 DIAGNOSIS — R0789 Other chest pain: Secondary | ICD-10-CM | POA: Diagnosis not present

## 2018-04-15 DIAGNOSIS — Z8505 Personal history of malignant neoplasm of liver: Secondary | ICD-10-CM | POA: Diagnosis not present

## 2018-04-15 DIAGNOSIS — Z8249 Family history of ischemic heart disease and other diseases of the circulatory system: Secondary | ICD-10-CM | POA: Diagnosis not present

## 2018-04-15 DIAGNOSIS — Z7902 Long term (current) use of antithrombotics/antiplatelets: Secondary | ICD-10-CM | POA: Diagnosis not present

## 2018-04-15 DIAGNOSIS — Z950 Presence of cardiac pacemaker: Secondary | ICD-10-CM | POA: Diagnosis not present

## 2018-04-15 DIAGNOSIS — Z6841 Body Mass Index (BMI) 40.0 and over, adult: Secondary | ICD-10-CM | POA: Diagnosis not present

## 2018-04-15 DIAGNOSIS — Z823 Family history of stroke: Secondary | ICD-10-CM | POA: Diagnosis not present

## 2018-04-15 DIAGNOSIS — Z87891 Personal history of nicotine dependence: Secondary | ICD-10-CM | POA: Diagnosis not present

## 2018-04-15 DIAGNOSIS — I442 Atrioventricular block, complete: Secondary | ICD-10-CM

## 2018-04-15 DIAGNOSIS — J810 Acute pulmonary edema: Secondary | ICD-10-CM | POA: Diagnosis present

## 2018-04-15 DIAGNOSIS — Z8673 Personal history of transient ischemic attack (TIA), and cerebral infarction without residual deficits: Secondary | ICD-10-CM | POA: Diagnosis not present

## 2018-04-15 DIAGNOSIS — Z794 Long term (current) use of insulin: Secondary | ICD-10-CM | POA: Diagnosis not present

## 2018-04-15 DIAGNOSIS — K7460 Unspecified cirrhosis of liver: Secondary | ICD-10-CM | POA: Diagnosis present

## 2018-04-15 DIAGNOSIS — Z9221 Personal history of antineoplastic chemotherapy: Secondary | ICD-10-CM | POA: Diagnosis not present

## 2018-04-15 DIAGNOSIS — I4430 Unspecified atrioventricular block: Secondary | ICD-10-CM

## 2018-04-15 DIAGNOSIS — N179 Acute kidney failure, unspecified: Secondary | ICD-10-CM | POA: Diagnosis not present

## 2018-04-15 DIAGNOSIS — Z96652 Presence of left artificial knee joint: Secondary | ICD-10-CM | POA: Diagnosis present

## 2018-04-15 DIAGNOSIS — N400 Enlarged prostate without lower urinary tract symptoms: Secondary | ICD-10-CM | POA: Diagnosis present

## 2018-04-15 DIAGNOSIS — Z66 Do not resuscitate: Secondary | ICD-10-CM | POA: Diagnosis present

## 2018-04-15 HISTORY — DX: Atrioventricular block, complete: I44.2

## 2018-04-15 LAB — CBC WITH DIFFERENTIAL/PLATELET
Abs Immature Granulocytes: 0.01 10*3/uL (ref 0.00–0.07)
Basophils Absolute: 0 10*3/uL (ref 0.0–0.1)
Basophils Relative: 1 %
EOS PCT: 2 %
Eosinophils Absolute: 0.1 10*3/uL (ref 0.0–0.5)
HCT: 32.9 % — ABNORMAL LOW (ref 39.0–52.0)
Hemoglobin: 10.8 g/dL — ABNORMAL LOW (ref 13.0–17.0)
Immature Granulocytes: 0 %
Lymphocytes Relative: 25 %
Lymphs Abs: 0.7 10*3/uL (ref 0.7–4.0)
MCH: 31.6 pg (ref 26.0–34.0)
MCHC: 32.8 g/dL (ref 30.0–36.0)
MCV: 96.2 fL (ref 80.0–100.0)
Monocytes Absolute: 0.4 10*3/uL (ref 0.1–1.0)
Monocytes Relative: 14 %
Neutro Abs: 1.6 10*3/uL — ABNORMAL LOW (ref 1.7–7.7)
Neutrophils Relative %: 58 %
Platelets: 73 10*3/uL — ABNORMAL LOW (ref 150–400)
RBC: 3.42 MIL/uL — ABNORMAL LOW (ref 4.22–5.81)
RDW: 15.3 % (ref 11.5–15.5)
WBC: 2.7 10*3/uL — ABNORMAL LOW (ref 4.0–10.5)
nRBC: 0 % (ref 0.0–0.2)

## 2018-04-15 LAB — GLUCOSE, CAPILLARY
GLUCOSE-CAPILLARY: 161 mg/dL — AB (ref 70–99)
Glucose-Capillary: 119 mg/dL — ABNORMAL HIGH (ref 70–99)
Glucose-Capillary: 201 mg/dL — ABNORMAL HIGH (ref 70–99)
Glucose-Capillary: 211 mg/dL — ABNORMAL HIGH (ref 70–99)
Glucose-Capillary: 181 mg/dL — ABNORMAL HIGH (ref 70–99)
Glucose-Capillary: 142 mg/dL — ABNORMAL HIGH (ref 70–99)

## 2018-04-15 LAB — BASIC METABOLIC PANEL
Anion gap: 8 (ref 5–15)
BUN: 13 mg/dL (ref 8–23)
CO2: 28 mmol/L (ref 22–32)
CREATININE: 1.08 mg/dL (ref 0.61–1.24)
Calcium: 8.3 mg/dL — ABNORMAL LOW (ref 8.9–10.3)
Chloride: 105 mmol/L (ref 98–111)
GFR calc Af Amer: >60 mL/min (ref >60)
GFR calc non Af Amer: >60 mL/min (ref >60)
Glucose, Bld: 123 mg/dL — ABNORMAL HIGH (ref 70–99)
Potassium: 3.4 mmol/L — ABNORMAL LOW (ref 3.5–5.1)
Sodium: 141 mmol/L (ref 135–145)

## 2018-04-15 LAB — ECHOCARDIOGRAM COMPLETE
Height: 70 in
WEIGHTICAEL: 4899.2 [oz_av]

## 2018-04-15 LAB — HEMOGLOBIN A1C
Hgb A1c MFr Bld: 6 % — ABNORMAL HIGH (ref 4.8–5.6)
MEAN PLASMA GLUCOSE: 125.5 mg/dL

## 2018-04-15 LAB — TROPONIN I: TROPONIN I: 0.03 ng/mL — AB (ref <0.03)

## 2018-04-15 MED ORDER — IPRATROPIUM-ALBUTEROL 0.5-2.5 (3) MG/3ML IN SOLN
3.0000 mL | Freq: Four times a day (QID) | RESPIRATORY_TRACT | Status: AC
  Administered 2018-04-15 (×2): 3 mL via RESPIRATORY_TRACT
  Filled 2018-04-15 (×2): qty 3

## 2018-04-15 MED ORDER — FUROSEMIDE 10 MG/ML IJ SOLN
40.0000 mg | Freq: Two times a day (BID) | INTRAMUSCULAR | Status: DC
  Administered 2018-04-15 – 2018-04-16 (×2): 40 mg via INTRAVENOUS
  Filled 2018-04-15 (×2): qty 4

## 2018-04-15 MED ORDER — RIFAXIMIN 550 MG PO TABS
550.0000 mg | ORAL_TABLET | Freq: Two times a day (BID) | ORAL | Status: DC
  Administered 2018-04-15 – 2018-04-20 (×8): 550 mg via ORAL
  Filled 2018-04-15 (×10): qty 1

## 2018-04-15 MED ORDER — SODIUM CHLORIDE 0.9 % IV SOLN
INTRAVENOUS | Status: DC
  Administered 2018-04-16: 08:00:00 via INTRAVENOUS

## 2018-04-15 MED ORDER — CARVEDILOL 3.125 MG PO TABS
3.1250 mg | ORAL_TABLET | Freq: Two times a day (BID) | ORAL | Status: DC
  Filled 2018-04-15: qty 1

## 2018-04-15 MED ORDER — POTASSIUM CHLORIDE CRYS ER 10 MEQ PO TBCR
40.0000 meq | EXTENDED_RELEASE_TABLET | Freq: Every day | ORAL | Status: DC
  Administered 2018-04-15: 40 meq via ORAL
  Filled 2018-04-15 (×3): qty 4

## 2018-04-15 MED ORDER — PERFLUTREN LIPID MICROSPHERE
1.0000 mL | INTRAVENOUS | Status: DC | PRN
  Administered 2018-04-15: 2 mL via INTRAVENOUS
  Filled 2018-04-15: qty 10

## 2018-04-15 MED ORDER — CARVEDILOL 3.125 MG PO TABS
3.1250 mg | ORAL_TABLET | Freq: Two times a day (BID) | ORAL | Status: DC
  Administered 2018-04-16 – 2018-04-19 (×5): 3.125 mg via ORAL
  Filled 2018-04-15 (×5): qty 1

## 2018-04-15 NOTE — Consult Note (Signed)
CARDIOLOGY CONSULT NOTE  Patient ID: Keith [name] MRN: [id number] DOB/AGE: 76/11/1940 76 y.o.  Admit date: 04/14/2018 Referring Physician  Golden Pop, MD Primary Physician:  Haywood Pao, MD Reason for Consultation  CHF  HPI: Keith [name]  is a 78 y.o. male  With extensive medical history including morbid obesity, cirrhosis of liver due to [facility], obstructive sleep apnea, not on CPAP (non compliant), history of moderate aortic stenosis, high degree AV block S/P Medtronic dual-chamber pacemaker implantation on 06/25/2016 and pacemaker dependent, chronic diastolic heart failure with no significant coronary artery disease by catheterization in 2018, diabetes mellitus, hypertension, hyperlipidemia, history of TIA in 2007, history of Hepatocellular carcinoma S/P  Partial resection some time in 2007 follows [facility] (Dr Gordy Levan),  follicular lymphoma S/P chemotherapy 2009 and sees [facility] once every 6 months (Dr. Wadie Lessen), normally follows [facility], now admitted to the hospital with acute decompensated heart failure.  He is presently doing well, states that his dyspnea has improved.  Still not back to his baseline.  No chest pain, palpitations.   Past Medical History:  Diagnosis Date  . Cirrhosis of liver (HCC)    NASH  . Diabetes mellitus   . Hepatic cancer ([place])   . Prostate hypertrophy   . Stroke ([place])   . Varicose veins   . Weakness of both legs      Past Surgical History:  Procedure Laterality Date  . APPENDECTOMY    . EYE SURGERY     bilateral cataract   . left arm surgery     age 3  . left hand surgery     tendons ripped on left hand  . LIVER RESECTION    . TONSILLECTOMY    . TOTAL KNEE ARTHROPLASTY Left 12/14/2012   Procedure: LEFT TOTAL KNEE ARTHROPLASTY;  Surgeon: Gearlean Alf, MD;  Location: [facility];  Service: Orthopedics;  Laterality: Left;     Family History  Problem Relation Age of Onset  . Stroke Father   . Heart failure Brother  81       Identical twin     Social History: Social History   Socioeconomic History  . Marital status: Married    Spouse name: Not on file  . Number of children: Not on file  . Years of education: Not on file  . Highest education level: Not on file  Occupational History  . Occupation: retired  Scientific laboratory technician  . Financial resource strain: Not on file  . Food insecurity:    Worry: Not on file    Inability: Not on file  . Transportation needs:    Medical: Not on file    Non-medical: Not on file  Tobacco Use  . Smoking status: Former Smoker    Last attempt to quit: 1970    Years since quitting: 50.0  . Smokeless tobacco: Former Systems developer    Quit date: 11/20/1983  Substance and Sexual Activity  . Alcohol use: No    Alcohol/week: 0.0 standard drinks  . Drug use: No  . Sexual activity: Not Currently  Lifestyle  . Physical activity:    Days per week: Not on file    Minutes per session: Not on file  . Stress: Not on file  Relationships  . Social connections:    Talks on phone: Not on file    Gets together: Not on file    Attends religious service: Not on file    Active member of club or organization: Not on file  Attends meetings of clubs or organizations: Not on file    Relationship status: Not on file  . Intimate partner violence:    Fear of current or ex partner: Not on file    Emotionally abused: Not on file    Physically abused: Not on file    Forced sexual activity: Not on file  Other Topics Concern  . Not on file  Social History Narrative  . Not on file     Medications Prior to Admission  Medication Sig Dispense Refill Last Dose  . albuterol (PROVENTIL HFA;VENTOLIN HFA) 108 (90 BASE) MCG/ACT inhaler Inhale 2 puffs into the lungs every 4 (four) hours as needed for wheezing or shortness of breath.    04/14/2018 at unk  . amitriptyline (ELAVIL) 25 MG tablet Take 25 mg by mouth at bedtime.    04/13/2018 at pm  . Cholecalciferol (VITAMIN D3) 50 MCG (2000 UT) TABS Take  2,000 Units by mouth daily.   04/14/2018 at Unknown time  . clopidogrel (PLAVIX) 75 MG tablet Take 75 mg by mouth daily.   04/14/2018 at 1400  . cyanocobalamin (,VITAMIN B-12,) 1000 MCG/ML injection Inject 1,000 mcg into the muscle every 30 (thirty) days.   03/08/2018 at Unknown time  . finasteride (PROSCAR) 5 MG tablet Take 5 mg by mouth every evening.    04/09/4578 at pm  . folic acid (FOLVITE) 1 MG tablet Take 1 mg by mouth daily.   04/14/2018 at 1400  . furosemide (LASIX) 20 MG tablet Take 1 tablet (20 mg total) by mouth as needed for fluid or edema. take 1 tab po every other day for decompensated liver cirrhosis (Patient taking differently: Take 20 mg by mouth See admin instructions. Take 20 mg by mouth every other day- on ODD-NUMBERED days only) 30 tablet  04/14/2018 at 1400  . hydrocerin (EUCERIN) CREA Apply 1 application topically daily as needed (dry skin).    unk at [employer]  . insulin aspart (NOVOLOG) 100 UNIT/ML injection Inject 5-30 Units into the skin See admin instructions. Inject 5-30 units into the skin three times a day before meals, per sliding scale   04/13/2018 at pm  . insulin glargine (LANTUS) 100 UNIT/ML injection Inject 40 Units into the skin See admin instructions. Inject 40 units into the skin 2 times a day- 10 AM and bedtime   04/14/2018 at am  . memantine (NAMENDA) 10 MG tablet Take 20 mg by mouth at bedtime.   04/13/2018 at pm  . pantoprazole (PROTONIX) 40 MG tablet Take 40 mg by mouth daily.   04/14/2018 at 1400  . polyvinyl alcohol (LIQUIFILM TEARS) 1.4 % ophthalmic solution Place 1 drop into both eyes 2 (two) times daily as needed (dry eyes).   Past Month at Unknown time  . pregabalin (LYRICA) 300 MG capsule Take 300 mg by mouth 2 (two) times daily.   04/14/2018 at Unknown time  . rifaximin (XIFAXAN) 550 MG TABS tablet Take 550 mg by mouth 2 (two) times daily.   04/14/2018 at 1400  . simvastatin (ZOCOR) 20 MG tablet Take 10 mg by mouth every evening.   04/13/2018 at pm  . spironolactone (ALDACTONE)  25 MG tablet Take 1 tablet (25 mg total) by mouth as needed (edema-- take with lasix). (Patient taking differently: Take 25 mg by mouth See admin instructions. Take 25 mg by mouth every other day- on ODD-NUMBERED days only)   04/14/2018 at 1400  . tamsulosin (FLOMAX) 0.4 MG CAPS Take 0.4 mg by mouth every evening.  04/13/2018 at pm  . traMADol (ULTRAM) 50 MG tablet Take 1 tablet (50 mg total) by mouth every 12 (twelve) hours as needed for moderate pain. (Patient taking differently: Take 50 mg by mouth daily as needed for moderate pain. ) 30 tablet  unk at unk    Review of Systems  Constitutional: Positive for weight loss (About 25 to 30 pound weight loss in the last 6 months intentionally).  HENT: Negative.   Eyes: Negative.   Respiratory: Positive for shortness of breath and wheezing. Negative for hemoptysis.   Cardiovascular: Positive for orthopnea and leg swelling.  Gastrointestinal: Negative.   Genitourinary: Negative.   Musculoskeletal: Positive for back pain and joint pain.  Neurological:       Unsteady gait, uses walker.  Psychiatric/Behavioral: Negative.   All other systems reviewed and are negative.   Physical Exam: Blood pressure (!) 95/57, pulse 96, temperature 98.2 F (36.8 C), temperature source Oral, resp. rate 20, height 5' 10"  (1.778 m), weight (!) 138.9 kg, SpO2 100 %. Body mass index is 43.94 kg/m.  Physical Exam  Constitutional: He is oriented to person, place, and time. He appears well-developed and well-nourished. No distress.  Morbidly obese  HENT:  Head: Atraumatic.  Eyes: Conjunctivae are normal.  Neck: Neck supple.  JVD difficult to make out due to short neck  Cardiovascular: Normal rate, regular rhythm and normal heart sounds.  Pulses:      Carotid pulses are 3+ on the right side and 3+ on the left side.      Radial pulses are 3+ on the right side and 3+ on the left side.       Femoral pulses are 2+ on the right side and 2+ on the left side.      Left  popliteal pulse not accessible.       Dorsalis pedis pulses are 1+ on the right side and 2+ on the left side.       Posterior tibial pulses are 0 on the right side and 1+ on the left side.  Pulmonary/Chest: Effort normal. He has rales (Bilateral basal crackles heard). He exhibits no tenderness.  Abdominal: Soft. Bowel sounds are normal.  Pannus present, nontender, no obvious organomegaly  Musculoskeletal: Normal range of motion.        General: Edema (2+ bilateral.  Chronic venous stasis dermatitis noted.) present.  Neurological: He is alert and oriented to person, place, and time.  Skin: Skin is warm.  Psychiatric: He has a normal mood and affect.    Labs:  BNP (last 3 results) Recent Labs    12/17/17 1518 04/14/18 1142  BNP 93.8 435.8*    CMP Latest Ref Rng & Units 04/15/2018 04/14/2018 12/18/2017  Glucose 70 - 99 mg/dL 123(H) 146(H) 147(H)  BUN 8 - 23 mg/dL 13 13 8   Creatinine 0.61 - 1.24 mg/dL 1.08 0.94 0.93  Sodium 135 - 145 mmol/L 141 139 144  Potassium 3.5 - 5.1 mmol/L 3.4(L) 4.1 3.4(L)  Chloride 98 - 111 mmol/L 105 107 110  CO2 22 - 32 mmol/L 28 24 24   Calcium 8.9 - 10.3 mg/dL 8.3(L) 8.7(L) 8.4(L)  Total Protein 6.5 - 8.1 g/dL - - 5.4(L)  Total Bilirubin 0.3 - 1.2 mg/dL - - 1.1  Alkaline Phos 38 - 126 U/L - - 77  AST 15 - 41 U/L - - 29  ALT 0 - 44 U/L - - 21     CBC Latest Ref Rng & Units 04/15/2018 04/14/2018 12/18/2017  WBC  4.0 - 10.5 K/uL 2.7(L) 3.5(L) 2.2(L)  Hemoglobin 13.0 - 17.0 g/dL 10.8(L) 11.8(L) 10.5(L)  Hematocrit 39.0 - 52.0 % 32.9(L) 37.8(L) 31.4(L)  Platelets 150 - 400 K/uL 73(L) 75(L) 43(L)   Lipid Panel     Component Value Date/Time   CHOL 149 11/23/2017 1404   TRIG 79 11/23/2017 1404   HDL 37 (L) 11/23/2017 1404   CHOLHDL 4.0 11/23/2017 1404   VLDL 16 11/23/2017 1404   LDLCALC 96 11/23/2017 1404   BNP (last 3 results) Recent Labs    12/17/17 1518 04/14/18 1142  BNP 93.8 435.8*    HEMOGLOBIN A1C Lab Results  Component Value Date    HGBA1C 6.0 (H) 04/15/2018   MPG 125.5 04/15/2018    Cardiac Panel (last 3 results) Recent Labs    04/15/18 1135  TROPONINI 0.03*    TSH Recent Labs    11/23/17 1404  TSH 1.943   Radiology: Dg Chest 2 View  Result Date: 04/14/2018 CLINICAL DATA:  Cough and shortness of breath. EXAM: CHEST - 2 VIEW COMPARISON:  12/17/2017 FINDINGS: The heart size and pulmonary vascularity are normal. Aortic atherosclerosis. Pacemaker in place. There are new minimal bilateral pleural effusions. There is increased peribronchial thickening and interstitial accentuation bilaterally without consolidative infiltrates. No acute bone abnormality. IMPRESSION: 1. New minimal bilateral pleural effusions. 2. Increased bronchitic changes. 3.  Aortic Atherosclerosis (ICD10-I70.0). Electronically Signed   By: Lorriane Shire M.D.   On: 04/14/2018 11:40    Scheduled Meds: . amitriptyline  37.5 mg Oral QHS  . aspirin EC  81 mg Oral Daily  . clopidogrel  75 mg Oral Daily  . enoxaparin (LOVENOX) injection  40 mg Subcutaneous Q24H  . finasteride  5 mg Oral Daily  . folic acid  1 mg Oral Daily  . furosemide  20 mg Intravenous Q12H  . insulin aspart  0-15 Units Subcutaneous TID WC  . insulin aspart  0-5 Units Subcutaneous QHS  . insulin glargine  42 Units Subcutaneous BID  . ipratropium-albuterol  3 mL Nebulization Q6H  . lactulose  15 g Oral BID  . lisinopril  2.5 mg Oral Daily  . memantine  20 mg Oral QHS  . pantoprazole  40 mg Oral Daily  . pregabalin  300 mg Oral BID  . simvastatin  10 mg Oral QPM  . sodium chloride flush  3 mL Intravenous Q12H  . tamsulosin  0.4 mg Oral QPC supper   Continuous Infusions: . sodium chloride     PRN Meds:.sodium chloride, acetaminophen, ondansetron (ZOFRAN) IV, sodium chloride flush, traMADol  CARDIAC STUDIES:  EKG 04/15/2018: Sinus rhythm with first-degree AV block, ventricularly paced rhythm.  No further analysis. Echocardiogram 04/15/2018:  - Left ventricle: Very poor  echo window. In spite of contrast  injection, wall motion was difficult to be seen. Grossly appears  to have low normal LVEF to mildly reduced. The cavity size was  mildly dilated. There was mild concentric hypertrophy. Systolic  function was mildly reduced. The estimated ejection fraction was  in the range of 45% to 50%. There was an increased relative  contribution of atrial contraction to ventricular filling.  Doppler parameters are consistent with a  restrictive   pattern, indicative of decreased left ventricular diastolic  compliance and/or increased left atrial pressure (grade 3  diastolic dysfunction). Doppler parameters are consistent with  both elevated ventricular end-diastolic filling pressure and  elevated left atrial filling pressure. - Aortic valve: There was moderate stenosis by mean PG and peak PG.  Consider TEE to better evaluate the AV structure. Mean gradient  (S): 22 mm Hg. Peak gradient (S): 39 mm Hg. Valve area (VTI):  1.24 cm. Valve area (Vmax): 1.14 cm. Valve area (Vmean): 1.08 cm. - Mitral valve: Calcified annulus.  - Left atrium: The atrium was mildly dilated. ASSESSMENT AND PLAN:  1. Acute on chronic diastolic heart failure probably contributed by his dietary habits and aortic stenosis.  No significant coronary artery disease by angiography in 2018. 2.  Moderate aortic stenosis, although mean gradient suggest mild, it is probably at least moderate by 2D, consider TEE. 3.  Second-degree AV block S/P Medtronic Advisa [id number] dual chamber pacemaker with Medtronic 5076 atrial lead and Medtronic 3830 HIS lead, all implanted 06/25/16. 4.  Diabetes mellitus type 2 controlled without hyperglycemia, on insulin without complications. 5.  Morbid obesity and obstructive sleep apnea, not on CPAP.  Recommendation: His blood pressure is very soft, unable to utilize any guideline directed medical therapy including appropriate dosing of ACE inhibitor and beta-blocker.  Continue IV Lasix for  now, he should potentially be discharged tomorrow but lifelong lifestyle changes needs to be done, I have discussed with the patient regarding avoidance of high salt diet. Will add low dose Coreg 3.125 mg BID.  His aortic stenosis appears to be much more severe than seen on the transthoracic echocardiogram.  This could also be precipitating his CHF.  He also has restrictive physiology on the transthoracic echocardiogram. I will set up TEE and AS may be contributing to his worsening dyspnea and also CHF. DM is controlled and lipids controlled on low dose statins. His TIA was in the remote past and also no known CAD. He also has moderate anemia and will discontinue DAPT. No indication for plavix.   Adrian Prows, MD, [facility] 04/15/2018, 1:20 PM [facility] [place] Pager: [phone] Office: [phone] If no answer Cell [phone]

## 2018-04-15 NOTE — Progress Notes (Signed)
Patient came back from ECHO. Wife is currently at bedside asking why patient did not received breakfast. Offered to give [place] sandwich both patient and wife refused.  Tod them to call cafeteria to order food.

## 2018-04-15 NOTE — H&P (View-Only) (Signed)
CARDIOLOGY CONSULT NOTE  Patient ID: Keith Keith [name] MRN: [id number] DOB/AGE: 04/11/1940 78 y.o.  Admit date: 04/14/2018 Referring Physician  Golden Pop, MD Primary Physician:  Haywood Pao, MD Reason for Consultation  CHF  HPI: Keith Keith [name]  is a 78 y.o. Keith [name]  With extensive medical history including morbid obesity, cirrhosis of liver due to [facility], obstructive sleep apnea, not on CPAP (non compliant), history of moderate aortic stenosis, high degree AV block S/P Medtronic dual-chamber pacemaker implantation on 06/25/2016 and pacemaker dependent, chronic diastolic heart failure with no significant coronary artery disease by catheterization in 2018, diabetes mellitus, hypertension, hyperlipidemia, history of TIA in 2007, history of Hepatocellular carcinoma S/P  Partial resection some time in 2007 follows [facility] (Dr Gordy Levan),  follicular lymphoma S/P chemotherapy 2009 and sees [facility] once every 6 months (Dr. Wadie Lessen), normally follows [facility], now admitted to the hospital with acute decompensated heart failure.  Keith is presently doing well, states that his dyspnea has improved.  Still not back to his baseline.  No chest pain, palpitations.   Past Medical History:  Diagnosis Date  . Cirrhosis of liver (HCC)    NASH  . Diabetes mellitus   . Hepatic cancer ([place])   . Prostate hypertrophy   . Stroke ([place])   . Varicose veins   . Weakness of both legs      Past Surgical History:  Procedure Laterality Date  . APPENDECTOMY    . EYE SURGERY     bilateral cataract   . left arm surgery     age 27  . left hand surgery     tendons ripped on left hand  . LIVER RESECTION    . TONSILLECTOMY    . TOTAL KNEE ARTHROPLASTY Left 12/14/2012   Procedure: LEFT TOTAL KNEE ARTHROPLASTY;  Surgeon: Gearlean Alf, MD;  Location: [facility];  Service: Orthopedics;  Laterality: Left;     Family History  Problem Relation Age of Onset  . Stroke Father   . Heart failure Brother  75       Identical twin     Social History: Social History   Socioeconomic History  . Marital status: Married    Spouse name: Not on file  . Number of children: Not on file  . Years of education: Not on file  . Highest education level: Not on file  Occupational History  . Occupation: retired  Scientific laboratory technician  . Financial resource strain: Not on file  . Food insecurity:    Worry: Not on file    Inability: Not on file  . Transportation needs:    Medical: Not on file    Non-medical: Not on file  Tobacco Use  . Smoking status: Former Smoker    Last attempt to quit: 1970    Years since quitting: 50.0  . Smokeless tobacco: Former Systems developer    Quit date: 11/20/1983  Substance and Sexual Activity  . Alcohol use: No    Alcohol/week: 0.0 standard drinks  . Drug use: No  . Sexual activity: Not Currently  Lifestyle  . Physical activity:    Days per week: Not on file    Minutes per session: Not on file  . Stress: Not on file  Relationships  . Social connections:    Talks on phone: Not on file    Gets together: Not on file    Attends religious service: Not on file    Active member of club or organization: Not on file  Attends meetings of clubs or organizations: Not on file    Relationship status: Not on file  . Intimate partner violence:    Fear of current or ex partner: Not on file    Emotionally abused: Not on file    Physically abused: Not on file    Forced sexual activity: Not on file  Other Topics Concern  . Not on file  Social History Narrative  . Not on file     Medications Prior to Admission  Medication Sig Dispense Refill Last Dose  . albuterol (PROVENTIL HFA;VENTOLIN HFA) 108 (90 BASE) MCG/ACT inhaler Inhale 2 puffs into the lungs every 4 (four) hours as needed for wheezing or shortness of breath.    04/14/2018 at unk  . amitriptyline (ELAVIL) 25 MG tablet Take 25 mg by mouth at bedtime.    04/13/2018 at pm  . Cholecalciferol (VITAMIN D3) 50 MCG (2000 UT) TABS Take  2,000 Units by mouth daily.   04/14/2018 at Unknown time  . clopidogrel (PLAVIX) 75 MG tablet Take 75 mg by mouth daily.   04/14/2018 at 1400  . cyanocobalamin (,VITAMIN B-12,) 1000 MCG/ML injection Inject 1,000 mcg into the muscle every 30 (thirty) days.   03/08/2018 at Unknown time  . finasteride (PROSCAR) 5 MG tablet Take 5 mg by mouth every evening.    4/0/9811 at pm  . folic acid (FOLVITE) 1 MG tablet Take 1 mg by mouth daily.   04/14/2018 at 1400  . furosemide (LASIX) 20 MG tablet Take 1 tablet (20 mg total) by mouth as needed for fluid or edema. take 1 tab po every other day for decompensated liver cirrhosis (Patient taking differently: Take 20 mg by mouth See admin instructions. Take 20 mg by mouth every other day- on ODD-NUMBERED days only) 30 tablet  04/14/2018 at 1400  . hydrocerin (EUCERIN) CREA Apply 1 application topically daily as needed (dry skin).    unk at [employer]  . insulin aspart (NOVOLOG) 100 UNIT/ML injection Inject 5-30 Units into the skin See admin instructions. Inject 5-30 units into the skin three times a day before meals, per sliding scale   04/13/2018 at pm  . insulin glargine (LANTUS) 100 UNIT/ML injection Inject 40 Units into the skin See admin instructions. Inject 40 units into the skin 2 times a day- 10 AM and bedtime   04/14/2018 at am  . memantine (NAMENDA) 10 MG tablet Take 20 mg by mouth at bedtime.   04/13/2018 at pm  . pantoprazole (PROTONIX) 40 MG tablet Take 40 mg by mouth daily.   04/14/2018 at 1400  . polyvinyl alcohol (LIQUIFILM TEARS) 1.4 % ophthalmic solution Place 1 drop into both eyes 2 (two) times daily as needed (dry eyes).   Past Month at Unknown time  . pregabalin (LYRICA) 300 MG capsule Take 300 mg by mouth 2 (two) times daily.   04/14/2018 at Unknown time  . rifaximin (XIFAXAN) 550 MG TABS tablet Take 550 mg by mouth 2 (two) times daily.   04/14/2018 at 1400  . simvastatin (ZOCOR) 20 MG tablet Take 10 mg by mouth every evening.   04/13/2018 at pm  . spironolactone (ALDACTONE)  25 MG tablet Take 1 tablet (25 mg total) by mouth as needed (edema-- take with lasix). (Patient taking differently: Take 25 mg by mouth See admin instructions. Take 25 mg by mouth every other day- on ODD-NUMBERED days only)   04/14/2018 at 1400  . tamsulosin (FLOMAX) 0.4 MG CAPS Take 0.4 mg by mouth every evening.  04/13/2018 at pm  . traMADol (ULTRAM) 50 MG tablet Take 1 tablet (50 mg total) by mouth every 12 (twelve) hours as needed for moderate pain. (Patient taking differently: Take 50 mg by mouth daily as needed for moderate pain. ) 30 tablet  unk at unk    Review of Systems  Constitutional: Positive for weight loss (About 25 to 30 pound weight loss in the last 6 months intentionally).  HENT: Negative.   Eyes: Negative.   Respiratory: Positive for shortness of breath and wheezing. Negative for hemoptysis.   Cardiovascular: Positive for orthopnea and leg swelling.  Gastrointestinal: Negative.   Genitourinary: Negative.   Musculoskeletal: Positive for back pain and joint pain.  Neurological:       Unsteady gait, uses walker.  Psychiatric/Behavioral: Negative.   All other systems reviewed and are negative.   Physical Exam: Blood pressure (!) 95/57, pulse 96, temperature 98.2 F (36.8 C), temperature source Oral, resp. rate 20, height 5' 10"  (1.778 m), weight (!) 138.9 kg, SpO2 100 %. Body mass index is 43.94 kg/m.  Physical Exam  Constitutional: Keith is oriented to person, place, and time. Keith appears well-developed and well-nourished. No distress.  Morbidly obese  HENT:  Head: Atraumatic.  Eyes: Conjunctivae are normal.  Neck: Neck supple.  JVD difficult to make out due to short neck  Cardiovascular: Normal rate, regular rhythm and normal heart sounds.  Pulses:      Carotid pulses are 3+ on the right side and 3+ on the left side.      Radial pulses are 3+ on the right side and 3+ on the left side.       Femoral pulses are 2+ on the right side and 2+ on the left side.      Left  popliteal pulse not accessible.       Dorsalis pedis pulses are 1+ on the right side and 2+ on the left side.       Posterior tibial pulses are 0 on the right side and 1+ on the left side.  Pulmonary/Chest: Effort normal. Keith has rales (Bilateral basal crackles heard). Keith exhibits no tenderness.  Abdominal: Soft. Bowel sounds are normal.  Pannus present, nontender, no obvious organomegaly  Musculoskeletal: Normal range of motion.        General: Edema (2+ bilateral.  Chronic venous stasis dermatitis noted.) present.  Neurological: Keith is alert and oriented to person, place, and time.  Skin: Skin is warm.  Psychiatric: Keith has a normal mood and affect.    Labs:  BNP (last 3 results) Recent Labs    12/17/17 1518 04/14/18 1142  BNP 93.8 435.8*    CMP Latest Ref Rng & Units 04/15/2018 04/14/2018 12/18/2017  Glucose 70 - 99 mg/dL 123(H) 146(H) 147(H)  BUN 8 - 23 mg/dL 13 13 8   Creatinine 0.61 - 1.24 mg/dL 1.08 0.94 0.93  Sodium 135 - 145 mmol/L 141 139 144  Potassium 3.5 - 5.1 mmol/L 3.4(L) 4.1 3.4(L)  Chloride 98 - 111 mmol/L 105 107 110  CO2 22 - 32 mmol/L 28 24 24   Calcium 8.9 - 10.3 mg/dL 8.3(L) 8.7(L) 8.4(L)  Total Protein 6.5 - 8.1 g/dL - - 5.4(L)  Total Bilirubin 0.3 - 1.2 mg/dL - - 1.1  Alkaline Phos 38 - 126 U/L - - 77  AST 15 - 41 U/L - - 29  ALT 0 - 44 U/L - - 21     CBC Latest Ref Rng & Units 04/15/2018 04/14/2018 12/18/2017  WBC  4.0 - 10.5 K/uL 2.7(L) 3.5(L) 2.2(L)  Hemoglobin 13.0 - 17.0 g/dL 10.8(L) 11.8(L) 10.5(L)  Hematocrit 39.0 - 52.0 % 32.9(L) 37.8(L) 31.4(L)  Platelets 150 - 400 K/uL 73(L) 75(L) 43(L)   Lipid Panel     Component Value Date/Time   CHOL 149 11/23/2017 1404   TRIG 79 11/23/2017 1404   HDL 37 (L) 11/23/2017 1404   CHOLHDL 4.0 11/23/2017 1404   VLDL 16 11/23/2017 1404   LDLCALC 96 11/23/2017 1404   BNP (last 3 results) Recent Labs    12/17/17 1518 04/14/18 1142  BNP 93.8 435.8*    HEMOGLOBIN A1C Lab Results  Component Value Date    HGBA1C 6.0 (H) 04/15/2018   MPG 125.5 04/15/2018    Cardiac Panel (last 3 results) Recent Labs    04/15/18 1135  TROPONINI 0.03*    TSH Recent Labs    11/23/17 1404  TSH 1.943   Radiology: Dg Chest 2 View  Result Date: 04/14/2018 CLINICAL DATA:  Cough and shortness of breath. EXAM: CHEST - 2 VIEW COMPARISON:  12/17/2017 FINDINGS: The heart size and pulmonary vascularity are normal. Aortic atherosclerosis. Pacemaker in place. There are new minimal bilateral pleural effusions. There is increased peribronchial thickening and interstitial accentuation bilaterally without consolidative infiltrates. No acute bone abnormality. IMPRESSION: 1. New minimal bilateral pleural effusions. 2. Increased bronchitic changes. 3.  Aortic Atherosclerosis (ICD10-I70.0). Electronically Signed   By: Lorriane Shire M.D.   On: 04/14/2018 11:40    Scheduled Meds: . amitriptyline  37.5 mg Oral QHS  . aspirin EC  81 mg Oral Daily  . clopidogrel  75 mg Oral Daily  . enoxaparin (LOVENOX) injection  40 mg Subcutaneous Q24H  . finasteride  5 mg Oral Daily  . folic acid  1 mg Oral Daily  . furosemide  20 mg Intravenous Q12H  . insulin aspart  0-15 Units Subcutaneous TID WC  . insulin aspart  0-5 Units Subcutaneous QHS  . insulin glargine  42 Units Subcutaneous BID  . ipratropium-albuterol  3 mL Nebulization Q6H  . lactulose  15 g Oral BID  . lisinopril  2.5 mg Oral Daily  . memantine  20 mg Oral QHS  . pantoprazole  40 mg Oral Daily  . pregabalin  300 mg Oral BID  . simvastatin  10 mg Oral QPM  . sodium chloride flush  3 mL Intravenous Q12H  . tamsulosin  0.4 mg Oral QPC supper   Continuous Infusions: . sodium chloride     PRN Meds:.sodium chloride, acetaminophen, ondansetron (ZOFRAN) IV, sodium chloride flush, traMADol  CARDIAC STUDIES:  EKG 04/15/2018: Sinus rhythm with first-degree AV block, ventricularly paced rhythm.  No further analysis. Echocardiogram 04/15/2018:  - Left ventricle: Very poor  echo window. In spite of contrast  injection, wall motion was difficult to be seen. Grossly appears  to have low normal LVEF to mildly reduced. The cavity size was  mildly dilated. There was mild concentric hypertrophy. Systolic  function was mildly reduced. The estimated ejection fraction was  in the range of 45% to 50%. There was an increased relative  contribution of atrial contraction to ventricular filling.  Doppler parameters are consistent with a  restrictive   pattern, indicative of decreased left ventricular diastolic  compliance and/or increased left atrial pressure (grade 3  diastolic dysfunction). Doppler parameters are consistent with  both elevated ventricular end-diastolic filling pressure and  elevated left atrial filling pressure. - Aortic valve: There was moderate stenosis by mean PG and peak PG.  Consider TEE to better evaluate the AV structure. Mean gradient  (S): 22 mm Hg. Peak gradient (S): 39 mm Hg. Valve area (VTI):  1.24 cm. Valve area (Vmax): 1.14 cm. Valve area (Vmean): 1.08 cm. - Mitral valve: Calcified annulus.  - Left atrium: The atrium was mildly dilated. ASSESSMENT AND PLAN:  1. Acute on chronic diastolic heart failure probably contributed by his dietary habits and aortic stenosis.  No significant coronary artery disease by angiography in 2018. 2.  Moderate aortic stenosis, although mean gradient suggest mild, it is probably at least moderate by 2D, consider TEE. 3.  Second-degree AV block S/P Medtronic Advisa [id number] dual chamber pacemaker with Medtronic 5076 atrial lead and Medtronic 3830 HIS lead, all implanted 06/25/16. 4.  Diabetes mellitus type 2 controlled without hyperglycemia, on insulin without complications. 5.  Morbid obesity and obstructive sleep apnea, not on CPAP.  Recommendation: His blood pressure is very soft, unable to utilize any guideline directed medical therapy including appropriate dosing of ACE inhibitor and beta-blocker.  Continue IV Lasix for  now, Keith should potentially be discharged tomorrow but lifelong lifestyle changes needs to be done, I have discussed with the patient regarding avoidance of high salt diet. Will add low dose Coreg 3.125 mg BID.  His aortic stenosis appears to be much more severe than seen on the transthoracic echocardiogram.  This could also be precipitating his CHF.  Keith also has restrictive physiology on the transthoracic echocardiogram. I will set up TEE and AS may be contributing to his worsening dyspnea and also CHF. DM is controlled and lipids controlled on low dose statins. His TIA was in the remote past and also no known CAD. Keith also has moderate anemia and will discontinue DAPT. No indication for plavix.   Adrian Prows, MD, [facility] 04/15/2018, 1:20 PM [facility]. [place] Pager: [phone] Office: [phone] If no answer Cell [phone]

## 2018-04-15 NOTE — Progress Notes (Signed)
CRITICAL VALUE ALERT  Critical Value:  Troponin:0.03  Date & Time Notied:  04/15/2018  1540  Provider Notified: Tamala Julian MD

## 2018-04-15 NOTE — Progress Notes (Signed)
Echocardiogram 2D Echocardiogram with Definity has been performed.  04/15/2018 9:25 AM Maudry Mayhew, MHA, RVT, RDCS, RDMS

## 2018-04-15 NOTE — Evaluation (Addendum)
Physical Therapy Evaluation & Discharge Patient Details Name: Keith [name] MRN: [id number] DOB: 01-Mar-1941 Today's Date: 04/15/2018   History of Present Illness  Pt is a 78 y.o. male admitted 04/14/17 with acute respiratory distress related to acute on chronic CHF. PMH includes pacemaker, HTN, DM, cirrhosis/hepatic CA, OSA, morbid obesity.    Clinical Impression  Patient evaluated by Physical Therapy with no further acute PT needs identified. PTA, pt mod indep with RW; lives with supportive wife. Today, pt mod indep with RW for mobility. DOE 3/4 with ambulation; SpO2 >95% on RA. Educ on activity program with heart failure and energy conservation strategies. All education has been completed and the patient has no further questions. Acute PT is signing off. Thank you for this referral.    Follow Up Recommendations Home health PT;Supervision - Intermittent    Equipment Recommendations  None recommended by PT    Recommendations for Other Services       Precautions / Restrictions Precautions Precautions: Fall Restrictions Weight Bearing Restrictions: No      Mobility  Bed Mobility Overal bed mobility: Modified Independent             General bed mobility comments: Increased time and effort; HOB elevated  Transfers Overall transfer level: Modified independent Equipment used: Rolling walker (2 wheeled)             General transfer comment: Reliant on momentum to power into standing  Ambulation/Gait Ambulation/Gait assistance: Modified independent (Device/Increase time) Gait Distance (Feet): 150 Feet Assistive device: Rolling walker (2 wheeled) Gait Pattern/deviations: Step-through pattern;Decreased stride length;Wide base of support Gait velocity: Decreased Gait velocity interpretation: 1.31 - 2.62 ft/sec, indicative of limited community ambulator General Gait Details: Slow, steady amb with RW; DOE 3/4. SpO2 97% on RA, HR 107  Stairs             Wheelchair Mobility    Modified Rankin (Stroke Patients Only)       Balance Overall balance assessment: Modified Independent                                           Pertinent Vitals/Pain Pain Assessment: No/denies pain    Home Living Family/patient expects to be discharged to:: Private residence Living Arrangements: Spouse/significant other Available Help at Discharge: Family;Available 24 hours/day Type of Home: House Home Access: Stairs to enter Entrance Stairs-Rails: Right;Left;Can reach both Entrance Stairs-Number of Steps: 2 Home Layout: One level Home Equipment: Grab bars - toilet;Grab bars - tub/shower;Walker - standard;Cane - single point;Other (comment);Walker - 2 wheels;Adaptive equipment      Prior Function Level of Independence: Independent with assistive device(s)         Comments: Uses RW for ambulation. Reports indep with all ADLs. Wife drives     Hand Dominance        Extremity/Trunk Assessment   Upper Extremity Assessment Upper Extremity Assessment: Overall WFL for tasks assessed    Lower Extremity Assessment Lower Extremity Assessment: Generalized weakness       Communication   Communication: HOH  Cognition Arousal/Alertness: Awake/alert Behavior During Therapy: WFL for tasks assessed/performed Overall Cognitive Status: Within Functional Limits for tasks assessed                                        General Comments  Exercises     Assessment/Plan    PT Assessment All further PT needs can be met in the next venue of care  PT Problem List Decreased activity tolerance;Decreased mobility;Cardiopulmonary status limiting activity;Decreased strength       PT Treatment Interventions      PT Goals (Current goals can be found in the Care Plan section)  Acute Rehab PT Goals PT Goal Formulation: All assessment and education complete, DC therapy    Frequency     Barriers to discharge         Co-evaluation               AM-PAC PT "6 Clicks" Mobility  Outcome Measure Help needed turning from your back to your side while in a flat bed without using bedrails?: None Help needed moving from lying on your back to sitting on the side of a flat bed without using bedrails?: None Help needed moving to and from a bed to a chair (including a wheelchair)?: None Help needed standing up from a chair using your arms (e.g., wheelchair or bedside chair)?: None Help needed to walk in hospital room?: None Help needed climbing 3-5 steps with a railing? : A Little 6 Click Score: 23    End of Session   Activity Tolerance: Patient tolerated treatment well Patient left: in bed;with call bell/phone within reach;Other (comment)(with NP present) Nurse Communication: Mobility status PT Visit Diagnosis: Other abnormalities of gait and mobility (R26.89)    Time: 0757-3225 PT Time Calculation (min) (ACUTE ONLY): 24 min   Charges:   PT Evaluation $PT Eval Moderate Complexity: [place], PT, DPT Acute Rehabilitation Services  Pager [phone] Office [place] 04/15/2018, 9:54 AM

## 2018-04-15 NOTE — Plan of Care (Signed)
Pt arrived to the unit, alert and oriented X4. Able to communicate needs. Pt was oriented to the room. Skin warm and dry, call bell within reach. Notified the respiratory therapist about pt's use of CPAP at bedtime. Pt refused to use oxygen while waiting for CPAP. No respiratory distress noted at this time. Will continue monitoring.

## 2018-04-15 NOTE — Progress Notes (Signed)
[facility] PROGRESS NOTE  Keith [name] HYW:[id number] DOB: 1940/09/03 DOA: 04/14/2018 PCP: Haywood Pao, MD  Assessment/Plan:  Acute respiratory distress related to acute on chronic diastolic CHF -little improvement this am inspite of having diuresed 1.2L. however he reports that his respiratory status is close to baseline.  oxygen saturation level greater than 90% on room air. LE remain edematous.  -Patient presened with acute respiratory difficulty, but not hypoxia  -He is morbidly obese and OHS may be contributing --CXR not overly consistent with pulmonary edema -chart review indicates hx heart failure as well as AV block and AS. [facility]  -follow echocardiogram   Acute on chronic diastolic heart failure likely related to recent change in lasix dose.  bnp slightly elevated, chest xray with New minimal bilateral pleural effusions. Increased bronchitic changes.  Aortic Atherosclerosis (ICD10-I70.0). received IV lasix and has diuresed 1.2L. weight down as well. Chart review indicates echo at [facility] in May 2019 EF 55%, moderate LVH concentric, left atrium mildly enlarged -continue lasix -continue Lisinopril 2.5 mg daily (BP is normal but he has reported h/o orthostasis so will start with very low dose) -No beta blocker due to h/o hypotension on BB -follow echo -cardiology consult -monitor intake and output -obtain daily weight -Has pacemaker  Chest pressure/pain. Complains of chest "pressure" after working with PT. Initial troponin neg. Repeat ekg without acute changes.  -cycle troponin -cardiology consult -supportive therapy  HTN -fair control -Off nadolol due to h/o hypotension -Start low-dose ACE, as above  HLD -Continue Zocor -Lipids were checked in 10/19 (TC 126, HDL 45, LDL 69, TG 59) so will not repeat at this time  DM -Last A1c was 6.6 in 8/19 -Continue Lantus 42 units BID -Will cover with moderate-scale SSI for now  Cirrhosis/hepatic  CA -s/p repeated embolization treatments for recurrent HCC -He is supposed to be taking Rifaximin to manage hepatic encephalopathy but this is not clearly listed on his MAR at this time (med rec has not been completed) - will order after med rec if appropriate -On Plavix due to progressive clotting of the portal/mesenteric vasculature  OSA -Continue CPAP -Patient is relatively noncompliant with this at home and this may be contributing to symptoms  Morbid obesity -Likely has a component of OHS that is contributing to symptoms -He appears to be relatively sedentary -PT consult  Thrombocytopenia. Platelets 73. Likely related to hx liver disease. No signs/sx bleeding   Code Status: full Family Communication: none at bedside Disposition Plan: home hopefully tomorrow   Consultants:  [facility]  Procedures:  echo  Antibiotics:    HPI/Subjective: Admitted with respiratory distress related to acute on chronic HF. Primary cardiology at [facility]. Diuresing well with lasix but states respiratory status mostly unchanged. Quite sob with PT.  Reports chest "pressure" after working with PT  Objective: Vitals:   04/15/18 0602 04/15/18 0746  BP: 105/71 106/63  Pulse: 88 89  Resp: 18 18  Temp: 98.5 F (36.9 C) 98.2 F (36.8 C)  SpO2: 97% 97%    Intake/Output Summary (Last 24 hours) at 04/15/2018 0930 Last data filed at 04/14/2018 2200 Gross per 24 hour  Intake -  Output 1200 ml  Net -1200 ml   Filed Weights   04/14/18 1118 04/15/18 0002  Weight: (!) 142.9 kg (!) 138.9 kg    Exam:   General:  Obese, alert in no acute distress  Cardiovascular: rrr +murmur 2+ le edema bilaterally chronic venous stasis changes bilaterally  Respiratory: mild increased work  of breathing with conversation. BS quite distant bilaterally particularly on left. No wheeze or crackle  Abdomen: obese non-distended +BS no guarding or rebounding  Musculoskeletal: joints without  swelling/erythema   Data Reviewed: Basic Metabolic Panel: Recent Labs  Lab 04/14/18 1142 04/15/18 0638  NA 139 141  K 4.1 3.4*  CL 107 105  CO2 24 28  GLUCOSE 146* 123*  BUN 13 13  CREATININE 0.94 1.08  CALCIUM 8.7* 8.3*   Liver Function Tests: No results for input(s): AST, ALT, ALKPHOS, BILITOT, PROT, ALBUMIN in the last 168 hours. No results for input(s): LIPASE, AMYLASE in the last 168 hours. No results for input(s): AMMONIA in the last 168 hours. CBC: Recent Labs  Lab 04/14/18 1142 04/15/18 0638  WBC 3.5* 2.7*  NEUTROABS  --  1.6*  HGB 11.8* 10.8*  HCT 37.8* 32.9*  MCV 100.0 96.2  PLT 75* 73*   Cardiac Enzymes: No results for input(s): CKTOTAL, CKMB, CKMBINDEX, TROPONINI in the last 168 hours. BNP (last 3 results) Recent Labs    12/17/17 1518 04/14/18 1142  BNP 93.8 435.8*    ProBNP (last 3 results) No results for input(s): PROBNP in the last 8760 hours.  CBG: Recent Labs  Lab 04/14/18 1602 04/14/18 2109 04/14/18 2307 04/15/18 0820  GLUCAP 149* 155* 161* 119*    No results found for this or any previous visit (from the past 240 hour(s)).   Studies: Dg Chest 2 View  Result Date: 04/14/2018 CLINICAL DATA:  Cough and shortness of breath. EXAM: CHEST - 2 VIEW COMPARISON:  12/17/2017 FINDINGS: The heart size and pulmonary vascularity are normal. Aortic atherosclerosis. Pacemaker in place. There are new minimal bilateral pleural effusions. There is increased peribronchial thickening and interstitial accentuation bilaterally without consolidative infiltrates. No acute bone abnormality. IMPRESSION: 1. New minimal bilateral pleural effusions. 2. Increased bronchitic changes. 3.  Aortic Atherosclerosis (ICD10-I70.0). Electronically Signed   By: Lorriane Shire M.D.   On: 04/14/2018 11:40    Scheduled Meds: . amitriptyline  37.5 mg Oral QHS  . aspirin EC  81 mg Oral Daily  . clopidogrel  75 mg Oral Daily  . enoxaparin (LOVENOX) injection  40 mg Subcutaneous  Q24H  . finasteride  5 mg Oral Daily  . folic acid  1 mg Oral Daily  . furosemide  20 mg Intravenous Q12H  . insulin aspart  0-15 Units Subcutaneous TID WC  . insulin aspart  0-5 Units Subcutaneous QHS  . insulin glargine  42 Units Subcutaneous BID  . lactulose  15 g Oral BID  . lisinopril  2.5 mg Oral Daily  . memantine  20 mg Oral QHS  . pantoprazole  40 mg Oral Daily  . pregabalin  300 mg Oral BID  . simvastatin  10 mg Oral QPM  . sodium chloride flush  3 mL Intravenous Q12H  . tamsulosin  0.4 mg Oral QPC supper   Continuous Infusions: . sodium chloride      Principal Problem:   Acute respiratory distress Active Problems:   Hypertension   Acute exacerbation of CHF (congestive heart failure) (HCC)   AV block   Chest pressure   Hyperlipidemia   OSA (obstructive sleep apnea)   Pacemaker   Diabetes mellitus type 2 in obese [facility])   Aortic stenosis   Cirrhosis of liver (HCC)   Hepatic cancer (HCC)   Obesity, Class III, BMI 40-49.9 (morbid obesity) ([place])    Time spent: 45 minutes    [facility] M  [facility]  If  7PM-7AM, please contact night-coverage at [email or web address], password [facility] 04/15/2018, 9:30 AM  LOS: 0 days

## 2018-04-15 NOTE — Progress Notes (Signed)
Spoke to RN about CPAP availability. There are currently no available CPAP's. All of them are currently being used by other patients.

## 2018-04-15 NOTE — Care Management Note (Signed)
Case Management Note  Patient Details  Name: Keith [name] MRN: [id number] Date of Birth: 01/22/1941  Subjective/Objective:   CHF                Action/Plan: Patient lives at home with spouse; goes to the [place] for medical care/ he [place] their pharmacy also; back up pharmacy if [facility] [address];PCP: Tisovec, Fransico Him, MD; [place]; his wife takes him to his apts,he no longer drives; DME - walker, cane crutches at home; CM will continue to follow for progression of care.  Expected Discharge Date:   possibly 04/19/2018               Expected Discharge Plan:   Home  Status of Service:   In progress  Sherrilyn Rist [phone] 04/15/2018, 11:02 AM

## 2018-04-16 ENCOUNTER — Inpatient Hospital Stay (HOSPITAL_COMMUNITY)
Admit: 2018-04-16 | Discharge: 2018-04-16 | Disposition: A | Discharge status: Home / Self Care | Payer: No Typology Code available for payment source | Attending: Cardiology | Admitting: Cardiology

## 2018-04-16 ENCOUNTER — Encounter (HOSPITAL_COMMUNITY)
Admission: EM | Disposition: A | Discharge status: Home Health | Payer: Self-pay | Source: Home / Self Care | Attending: Family Medicine

## 2018-04-16 ENCOUNTER — Encounter (HOSPITAL_COMMUNITY): Payer: Self-pay | Admitting: *Deleted

## 2018-04-16 ENCOUNTER — Other Ambulatory Visit (HOSPITAL_COMMUNITY): Payer: Medicare Other

## 2018-04-16 DIAGNOSIS — I350 Nonrheumatic aortic (valve) stenosis: Secondary | ICD-10-CM

## 2018-04-16 DIAGNOSIS — I5043 Acute on chronic combined systolic (congestive) and diastolic (congestive) heart failure: Secondary | ICD-10-CM

## 2018-04-16 HISTORY — PX: TEE WITHOUT CARDIOVERSION: SHX5443

## 2018-04-16 LAB — GLUCOSE, CAPILLARY
GLUCOSE-CAPILLARY: 183 mg/dL — AB (ref 70–99)
Glucose-Capillary: 101 mg/dL — ABNORMAL HIGH (ref 70–99)
Glucose-Capillary: 154 mg/dL — ABNORMAL HIGH (ref 70–99)
Glucose-Capillary: 154 mg/dL — ABNORMAL HIGH (ref 70–99)

## 2018-04-16 LAB — BASIC METABOLIC PANEL
Anion gap: 11 (ref 5–15)
BUN: 16 mg/dL (ref 8–23)
CO2: 25 mmol/L (ref 22–32)
Calcium: 8.4 mg/dL — ABNORMAL LOW (ref 8.9–10.3)
Chloride: 102 mmol/L (ref 98–111)
Creatinine, Ser: 1.28 mg/dL — ABNORMAL HIGH (ref 0.61–1.24)
GFR calc Af Amer: >60 mL/min (ref >60)
GFR calc non Af Amer: 54 mL/min — ABNORMAL LOW (ref >60)
GLUCOSE: 115 mg/dL — AB (ref 70–99)
Potassium: 3.7 mmol/L (ref 3.5–5.1)
Sodium: 138 mmol/L (ref 135–145)

## 2018-04-16 LAB — CBC
HCT: 35.9 % — ABNORMAL LOW (ref 39.0–52.0)
Hemoglobin: 11.5 g/dL — ABNORMAL LOW (ref 13.0–17.0)
MCH: 30.7 pg (ref 26.0–34.0)
MCHC: 32 g/dL (ref 30.0–36.0)
MCV: 96 fL (ref 80.0–100.0)
Platelets: 95 10*3/uL — ABNORMAL LOW (ref 150–400)
RBC: 3.74 MIL/uL — ABNORMAL LOW (ref 4.22–5.81)
RDW: 15.2 % (ref 11.5–15.5)
WBC: 4.2 10*3/uL (ref 4.0–10.5)
nRBC: 0 % (ref 0.0–0.2)

## 2018-04-16 LAB — BRAIN NATRIURETIC PEPTIDE: B Natriuretic Peptide: 96.5 pg/mL (ref 0.0–100.0)

## 2018-04-16 LAB — AMMONIA: Ammonia: 62 umol/L — ABNORMAL HIGH (ref 9–35)

## 2018-04-16 SURGERY — ECHOCARDIOGRAM, TRANSESOPHAGEAL
Anesthesia: Moderate Sedation

## 2018-04-16 MED ORDER — POTASSIUM CHLORIDE CRYS ER 20 MEQ PO TBCR
20.0000 meq | EXTENDED_RELEASE_TABLET | Freq: Every day | ORAL | Status: DC
  Administered 2018-04-18 – 2018-04-20 (×3): 20 meq via ORAL
  Filled 2018-04-16 (×3): qty 1

## 2018-04-16 MED ORDER — FENTANYL CITRATE (PF) 100 MCG/2ML IJ SOLN
INTRAMUSCULAR | Status: AC
  Filled 2018-04-16: qty 2

## 2018-04-16 MED ORDER — DIPHENHYDRAMINE HCL 50 MG/ML IJ SOLN
INTRAMUSCULAR | Status: AC
  Filled 2018-04-16: qty 1

## 2018-04-16 MED ORDER — DIPHENHYDRAMINE HCL 50 MG/ML IJ SOLN
INTRAMUSCULAR | Status: DC | PRN
  Administered 2018-04-16: 25 mg via INTRAVENOUS

## 2018-04-16 MED ORDER — BUTAMBEN-TETRACAINE-BENZOCAINE 2-2-14 % EX AERO
INHALATION_SPRAY | CUTANEOUS | Status: DC | PRN
  Administered 2018-04-16: 2 via TOPICAL

## 2018-04-16 MED ORDER — FUROSEMIDE 10 MG/ML IJ SOLN: 40.0000 mg | Freq: Every day | INTRAMUSCULAR | Status: DC

## 2018-04-16 MED ORDER — SODIUM CHLORIDE 0.9 % IV SOLN
INTRAVENOUS | Status: DC
  Administered 2018-04-17: 03:00:00 via INTRAVENOUS

## 2018-04-16 MED ORDER — MIDAZOLAM HCL (PF) 5 MG/ML IJ SOLN
INTRAMUSCULAR | Status: AC
  Filled 2018-04-16: qty 2

## 2018-04-16 MED ORDER — MIDAZOLAM HCL (PF) 10 MG/2ML IJ SOLN
INTRAMUSCULAR | Status: DC | PRN
  Administered 2018-04-16: 2 mg via INTRAVENOUS

## 2018-04-16 NOTE — Consult Note (Addendum)
[place] VALVE TEAM  Inpatient TAVR Consultation:   Patient ID: Keith [name]; [id number]; 01-Jun-1940   Admit date: 04/14/2018 Date of Consult: 04/16/2018  Primary Care Provider: Haywood Pao, MD Primary Cardiologist: [facility] ( Dr. Wadie Lessen and Dr. Westley Gambles- EP) / Dr. Einar Gip  Patient Profile:   Keith [name] is a 78 y.o. 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  He has no children and lives with his wife, Keith [name], here in [place].  Due to his many health problems and significant orthostasis with neuropathy he has a hard time getting around.  He no longer drives and walks with a walker.  He basically stays within his home most days but does get out to go to doctor's appointments at [facility].  He has almost full dentures and no active dental issues.  He was initially referred to [facility] in 11/2015 for evaluation of a heart murmur and sinus bradycardia. A subsequent transthoracic echocardiogram revealed normal left ventricular function with a mean aortic valve gradient of 11 mmHg, and aortic valve velocity of 2.4 m/s and a dimensionless index of 0.46. A 48 hour Holter monitor revealed heart rates varying between 37 and 93 with a mean heart rate of 73. There was no pauses greater than 2 seconds. They recommended avoidance of any beta-blocker therapy.  During a subsequent hospitalization for treatment of his hepatic cellular carcinoma, he was noticed to have intermittent second and high degree AV block and underwent Medtronic dual chamber pacemaker placement.  Repeat transthoracic echocardiogram 09/2016 revealed a mean aortic valve gradient of 19 mmHg, an area of 1.1 cm and a further decrease in his dimensionless index to 0.19. He underwent right and left heart catheterization 12/2016 by Dr. Sheila Oats which revealed normal cors and paradoxical low flow, low gradient moderate-to-severe AS with a valve area of 1.2 cm2 and a mean gradient of 22 mm Hg. Given that he did not complain of progressive dyspnea at that time and his other co morbidities, watchful waiting was recommended. Per Dr. Wadie Lessen "we would have a relatively high threshold to refer him for valve replacement."  He was admitted to [facility] in 11/2017 with generalized weakness. He was found to have an elevated ammonia and UTI.   He was readmitted in 12/2017 for near syncope w/ postural hypotension and neuropathy. His nadalol and diuretics were adjusted.   He was in his usual state of health until 04/14/2018 when he was seen for a routine visit at the [place] and noted to have worsening shortness of breath and wheezing x1 week. He went to the [facility] emergency department and was transferred to [facility] for further evaluation. He was found to have acute CHF with BNP elevated 435 and CXR with bilateral pleural effusions. Echo showed EF 45-50%, G3DD, mod AS with mean gradient of 22 mm Hg, peak gradient 39 mm Hg and AVA 1.08 cm^2. Dr. Einar Gip with cardiology was  consulted. He recommended a TEE which was completed today (formal report pending). Per Dr. Einar Gip this showed EF ~35% with AVA 0.7 cm^2 by planimetry. He has been diuresed 2.4L and feeling much better.   The patient states that over the past week or so he has noticed worsening dyspnea on exertion and exertional chest pain.  He described the chest pain as a  tightness across his chest when walking. He also noticed wheezing, which was unusual for him.  He spends most of his time in his house due to his peripheral neuropathy and use of a walker.  He was previously okay to walk around his house but over the past week or so noticed worsening dyspnea and chest pain with just walking from the bedroom to the kitchen.  He also noted some worsening mild lower extremity edema.  He denies orthopnea or PND.  He denies recent dizziness or syncope. He also notes worsening fatigue and states that it take a lot of energy to stay awake for prolonged periods of time.    Past Medical History:  Diagnosis Date  . Cirrhosis of liver (HCC)    NASH  . Diabetes mellitus   . Hepatic cancer ([place])   . Prostate hypertrophy   . Stroke ([place])   . Varicose veins   . Weakness of both legs     Past Surgical History:  Procedure Laterality Date  . APPENDECTOMY    . EYE SURGERY     bilateral cataract   . left arm surgery     age 33  . left hand surgery     tendons ripped on left hand  . LIVER RESECTION    . TONSILLECTOMY    . TOTAL KNEE ARTHROPLASTY Left 12/14/2012   Procedure: LEFT TOTAL KNEE ARTHROPLASTY;  Surgeon: Gearlean Alf, MD;  Location: [facility] ORS;  Service: Orthopedics;  Laterality: Left;     Inpatient Medications: Scheduled Meds: . amitriptyline  37.5 mg Oral QHS  . aspirin EC  81 mg Oral Daily  . carvedilol  3.125 mg Oral BID WC  . clopidogrel  75 mg Oral Daily  . enoxaparin (LOVENOX) injection  40 mg Subcutaneous Q24H  . finasteride  5 mg Oral Daily  . folic acid  1 mg Oral Daily  . [START ON 04/17/2018] furosemide  40 mg Intravenous Daily  . insulin aspart  0-15 Units Subcutaneous TID WC  . insulin aspart  0-5 Units Subcutaneous QHS  . insulin glargine  42 Units Subcutaneous BID  . lactulose  15 g Oral BID  . lisinopril  2.5 mg Oral Daily  . memantine  20 mg Oral QHS  . pantoprazole  40 mg Oral Daily  . [START ON 04/17/2018] potassium chloride  20 mEq  Oral Daily  . pregabalin  300 mg Oral BID  . rifaximin  550 mg Oral BID  . simvastatin  10 mg Oral QPM  . sodium chloride flush  3 mL Intravenous Q12H  . tamsulosin  0.4 mg Oral QPC supper   Continuous Infusions: . sodium chloride     PRN Meds: sodium chloride, acetaminophen, ondansetron (ZOFRAN) IV, sodium chloride flush, traMADol  Allergies:    Allergies  Allergen Reactions  . Contrast Media [Iodinated Diagnostic Agents] Rash    Cannot have contrast  . Fentanyl Itching and Rash  . Gadolinium Rash  . Iodine Rash  . Merbromin Rash  . Other Rash    "Opioids"    Social History:   Social History  Socioeconomic History  . Marital status: Married    Spouse name: Not on file  . Number of children: Not on file  . Years of education: Not on file  . Highest education level: Not on file  Occupational History  . Occupation: retired  Scientific laboratory technician  . Financial resource strain: Not on file  . Food insecurity:    Worry: Not on file    Inability: Not on file  . Transportation needs:    Medical: Not on file    Non-medical: Not on file  Tobacco Use  . Smoking status: Former Smoker    Last attempt to quit: 1970    Years since quitting: 50.0  . Smokeless tobacco: Former Systems developer    Quit date: 11/20/1983  Substance and Sexual Activity  . Alcohol use: No    Alcohol/week: 0.0 standard drinks  . Drug use: No  . Sexual activity: Not Currently  Lifestyle  . Physical activity:    Days per week: Not on file    Minutes per session: Not on file  . Stress: Not on file  Relationships  . Social connections:    Talks on phone: Not on file    Gets together: Not on file    Attends religious service: Not on file    Active member of club or organization: Not on file    Attends meetings of clubs or organizations: Not on file    Relationship status: Not on file  . Intimate partner violence:    Fear of current or ex partner: Not on file    Emotionally abused: Not on file    Physically  abused: Not on file    Forced sexual activity: Not on file  Other Topics Concern  . Not on file  Social History Narrative  . Not on file    Family History:   [name] family history includes Heart failure (age of onset: 23) in his brother; Stroke in his father.  ROS:  Please see the history of present illness.  ROS  All other ROS reviewed and negative.     Physical Exam/Data:   Vitals:   04/16/18 0945 04/16/18 0951 04/16/18 1000 04/16/18 1124  BP: 112/77 108/77 114/72 96/67  Pulse: 84 81 84 89  Resp: 17 17 14 20   Temp:  98 F (36.7 C)  (!) 97.5 F (36.4 C)  TempSrc:  Oral  Oral  SpO2: 97% 92% 94% 97%  Weight:      Height:        Intake/Output Summary (Last 24 hours) at 04/16/2018 1128 Last data filed at 04/16/2018 0902 Gross per 24 hour  Intake 483 ml  Output 1750 ml  Net -1267 ml   Filed Weights   04/14/18 1118 04/15/18 0002 04/16/18 0439  Weight: (!) 142.9 kg (!) 138.9 kg (!) 138.1 kg   Body mass index is 43.69 kg/m.  General: Obese, chronically appearing white male HEENT: normal Lymph: no adenopathy Neck: no JVD Endocrine:  No thryomegaly Vascular: No carotid bruits; FA pulses 2+ bilaterally without bruits  Cardiac:  normal S1, S2; RRR; 3/6 harsh SEM heard best at RUSB Lungs:  clear to auscultation bilaterally, no wheezing, rhonchi or rales  Abd: soft, nontender, no hepatomegaly  Ext: 1+ bilateral LE edema  Musculoskeletal:  No deformities, BUE and BLE strength normal and equal Skin: warm and dry  Neuro:  CNs 2-12 intact, no focal abnormalities noted Psych:  Normal affect   EKG:  The EKG was personally reviewed and  demonstrates:  Sinus  Telemetry:  Telemetry was personally reviewed and demonstrates: paced   Relevant CV Studies: Echo 04/15/2018 Study Conclusions - Left ventricle: Very poor echo window. In spite of contrast   injection, wall motion was difficult to be seen. Grossly appears   to have low normal LVEF to mildly reduced. The cavity size  was   mildly dilated. There was mild concentric hypertrophy. Systolic   function was mildly reduced. The estimated ejection fraction was   in the range of 45% to 50%. There was an increased relative   contribution of atrial contraction to ventricular filling.   Doppler parameters are consistent with a reversible restrictive   pattern, indicative of decreased left ventricular diastolic   compliance and/or increased left atrial pressure (grade 3   diastolic dysfunction). Doppler parameters are consistent with   both elevated ventricular end-diastolic filling pressure and   elevated left atrial filling pressure. - Aortic valve: There was moderate stenosis by mean PG and peak PG.   Consider TEE to better evaluate the AV structure. Mean gradient   (S): 22 mm Hg. Peak gradient (S): 39 mm Hg. Valve area (VTI):   1.24 cm^2. Valve area (Vmax): 1.14 cm^2. Valve area (Vmean): 1.08   cm^2. - Mitral valve: Calcified annulus. Valve area by continuity   equation (using LVOT flow): 2.27 cm^2. - Left atrium: The atrium was mildly dilated.  Laboratory Data:  Chemistry Recent Labs  Lab 04/14/18 1142 04/15/18 0638 04/16/18 0423  NA 139 141 138  K 4.1 3.4* 3.7  CL 107 105 102  CO2 24 28 25   GLUCOSE 146* 123* 115*  BUN 13 13 16   CREATININE 0.94 1.08 1.28*  CALCIUM 8.7* 8.3* 8.4*  GFRNONAA >60 >60 54*  GFRAA >60 >60 >60  ANIONGAP 8 8 11     No results for input(s): PROT, ALBUMIN, AST, ALT, ALKPHOS, BILITOT in the last 168 hours. Hematology Recent Labs  Lab 04/14/18 1142 04/15/18 0638 04/16/18 0423  WBC 3.5* 2.7* 4.2  RBC 3.78* 3.42* 3.74*  HGB 11.8* 10.8* 11.5*  HCT 37.8* 32.9* 35.9*  MCV 100.0 96.2 96.0  MCH 31.2 31.6 30.7  MCHC 31.2 32.8 32.0  RDW 15.3 15.3 15.2  PLT 75* 73* 95*   Cardiac Enzymes Recent Labs  Lab 04/15/18 1135  TROPONINI 0.03*    Recent Labs  Lab 04/14/18 1156  TROPIPOC 0.03    BNP Recent Labs  Lab 04/14/18 1142 04/16/18 0423  BNP 435.8* 96.5      DDimer No results for input(s): DDIMER in the last 168 hours.  Radiology/Studies:  Dg Chest 2 View  Result Date: 04/14/2018 CLINICAL DATA:  Cough and shortness of breath. EXAM: CHEST - 2 VIEW COMPARISON:  12/17/2017 FINDINGS: The heart size and pulmonary vascularity are normal. Aortic atherosclerosis. Pacemaker in place. There are new minimal bilateral pleural effusions. There is increased peribronchial thickening and interstitial accentuation bilaterally without consolidative infiltrates. No acute bone abnormality. IMPRESSION: 1. New minimal bilateral pleural effusions. 2. Increased bronchitic changes. 3.  Aortic Atherosclerosis (ICD10-I70.0). Electronically Signed   By: Lorriane Shire M.D.   On: 04/14/2018 11:40     STS Risk Calculator: Procedure: Isolated AVR  Risk of Mortality:  7.968%   Renal Failure:  13.364%   Permanent Stroke:  2.024%   Prolonged Ventilation:  26.487%   DSW Infection:  0.562%   Reoperation:  6.482%   Morbidity or Mortality:  33.196%   Short Length of Stay:  11.652%   Long Length of Stay:  24.402%     Assessment and Plan:   Keith [name] is a 78 y.o. male with symptoms of severe, stage D2 aortic stenosis with NYHA Class III symptoms of dyspnea with minimal exertion, chest pain and fatigue. I have reviewed the patient's recent echocardiogram which is notable for decreased LV systolic function (EF 00-92%) and severe low gradient, low EF aortic stenosis with peak gradient of 39 and mean transvalvular gradient of 22. [name] dimensionless index is 0.33 and calculated aortic valve area is 1.08 cm. TEE today (formal report pending) showed EF ~35% with AVA 0.7 cm^2 by planimetry.    I have reviewed the natural history of aortic stenosis with the patient. We have discussed the limitations of medical therapy and the poor prognosis associated with symptomatic aortic stenosis. We have reviewed potential treatment options, including palliative medical therapy,  conventional surgical aortic valve replacement, and transcatheter aortic valve replacement. We discussed treatment options in the context of this patient's specific comorbid medical conditions.   He his a complex medical history with known aortic stenosis that has been followed at [facility]. He was previously felt to have paradoxical low flow, low gradient moderate-to-severe AS. He now presents with acute CHF and EF down to 35%, possibly from progressive AS.  [name] predicted risk of mortality with conventional aortic valve replacement is 7.698% primarily based on his acute CHF with LV dysfunction, cerebrovascular disease with previous TIA, morbid obesity, diabetes, cirrhosis, anemia with thrombocytopenia, history of HCC, follicular lymphoma, and previous pacemaker placement. Other significant comorbid conditions include physical debilitation due to orthostasis and peripheral neuropathy. TAVR seems like a reasonable treatment option for this patient pending formal cardiac surgical consultation. We discussed typical evaluation which will require a gated cardiac CTA and a CTA of the chest/abdomen/pelvis to evaluate both his cardiac anatomy and peripheral vasculature. Plan for [facility] tomorrow with Dr Einar Gip.   Dr. Burt Knack to follow.    Signed, Angelena Form, PA-C  04/16/2018 11:28 AM   Patient seen, examined. Available data reviewed. Agree with findings, assessment, and plan as outlined by Nell Range, PA-C.  On my examination today, the patient is alert, oriented, in no distress.  He is an obese male, chronically ill-appearing.  He is comfortable at rest.  JVP is normal, lung fields are clear, heart is regular rate and rhythm with a grade 3/6 late peaking harsh systolic murmur with diminished A2.  Abdomen is soft and nontender with no palpable masses.  Extremities have chronic stasis changes bilaterally in the legs and trace bilateral pretibial edema.  I have personally reviewed the patient's cardiac  and medical history which is quite complex.  He has been followed at [facility] for many years for follicular lymphoma.  He has a history of nonalcoholic steatohepatitis and cirrhosis.  He has been appropriately followed by Dr. Wadie Lessen for moderately severe, paradoxical low flow low gradient aortic stenosis.  He has not had clear symptoms of congestive heart failure until his current hospital admission.  He now demonstrates clear evidence of LV systolic dysfunction with LVEF approximately 40%.  I have personally reviewed his TEE, surface echo, and cardiac catheterization images.  He has moderate stenosis at the ostium of the LAD with negative pressure wire analysis.  He otherwise has no obstructive coronary disease.  Peak to peak transaortic gradient is 30 mmHg and mean gradient is 25 mmHg by cath with a calculated aortic valve area of 1.1 cm.  [name] TEE shows moderate LV systolic dysfunction with LVEF approximately  40%.  There is calcification of all 3 aortic valve leaflets with a planimeter valve area of about 0.7 cm.  I think the patient clearly has developed severe low-flow low gradient aortic stenosis now associated with significant LV systolic dysfunction.  He does not have obstructive CAD and is shown to have no hemodynamically significant LAD stenosis despite moderate stenosis by angiography.  I suspect aortic stenosis has caused progressive decline in LV function and led to his current hospitalization with congestive heart failure.  We discussed treatment options at length today.  We reviewed the natural history of aortic stenosis and its association with LV dysfunction and congestive heart failure.  I spoke at length with the patient's wife over the telephone today.  She is a retired Marine scientist.  She has discussed his case with his primary cardiologist at [facility] who is in agreement with our plans to move forward with consideration of TAVR.  The patient has a fairly limited functional capacity as he ambulates  at home with a walker.  He has significant comorbid medical conditions outlined above and clearly would not be a candidate for conventional surgical aortic valve replacement.  We will continue with TAVR evaluation as an outpatient.  He is going to be scheduled for CT angiography studies of the heart as well as the chest, abdomen, and pelvis.  He will then have an outpatient evaluation with cardiac surgery as part of a multidisciplinary approach to his care.  I reviewed procedural aspects of TAVR as well as potential risks and contrasted this with palliative medical therapy.  After our discussion, the patient and his wife wish to proceed with further evaluation for TAVR.  All of their questions are answered today.  Sherren Mocha, M.D. 04/17/2018 4:38 PM

## 2018-04-16 NOTE — CV Procedure (Signed)
TEE: Indication: Aortic stenosis. Under moderate sedation, using 2 mg versed and 25 mg benadryl IV,  TEE was performed without complications: LV: LVH. Global hypokinesis. EF 35-40%.. RV: Normal. Pacemaker lead noted.  LA: Normal. Left atrial appendage: Normal without thrombus. Normal function. Inter atrial septum shows a small PFO with Color Doppler e/o left to right shunt.  RA: Normal, Eusthesian valve noted.   MV: Mild MR, central. TV: Normal Trace TR AV: Severely calcified. Trileaflet. Low gradient severe AS. Peak PG of 37 and mean PG is 21 mm Hg. AVA 1.02 cm2 by continuity. Planimetry valve area 0.7 cm2. PV: Normal. Trace PI.  Thoracic and ascending aorta: Diffuse mild atheresclerotic changes noted.   Conscious sedation protocol was followed, I personally administered conscious sedation and monitored the patient.  Patient tolerated the procedure well and there was no complication from conscious sedation. Time administered was 24  Minutes.  Adrian Prows, MD 04/16/2018, 9:50 AM [facility] [place] Pager: [phone] Office: [phone] If no answer: Cell:  [phone]

## 2018-04-16 NOTE — Progress Notes (Signed)
PROGRESS NOTE    Keith [name]  CVE:[id number] DOB: 08/08/1940 DOA: 04/14/2018 PCP: Haywood Pao, MD   Brief Narrative: Keith [name] is a 78 y.o. male with medical history significant of CVA; hepatic CA; cirrhosis of the liver; and DM.    Assessment & Plan:   Principal Problem:   Acute respiratory distress Active Problems:   Hypertension   Hyperlipidemia   Cirrhosis of liver (HCC)   OSA (obstructive sleep apnea)   Pacemaker   Hepatic cancer (HCC)   Acute exacerbation of CHF (congestive heart failure) (HCC)   Diabetes mellitus type 2 in obese (HCC)   Obesity, Class III, BMI 40-49.9 (morbid obesity) ([place])   Aortic stenosis   AV block   Chest pressure   Acute combined systolic/diastolic heart failure Reason for patient's shortness of breath. No current hypoxia. Cardiology consulted. Patient has been on IV lasix for treatment. Transthoracic Echocardiogram significant for an EF of 45-50% EF and grade 3 diastolic dysfunction -Cardiology recommendations: Transesophageal Echocardiogram today -Continue lisinopril, aspirin -Beta-blocker held secondary to hypotension -Lasix IV daily  Aortic stenosis Moderate. -Cardiology recommendations: Transesophageal Echocardiogram  Second degree AV block Patient with a pacemaker.  Essential hypertension Well controlled -Continue lisinopril  Hyperlipidemia -Continue Zocor  Diabetes mellitus, type 2 Last hemoglobin A1c of 6.6%.  -Continue Lantus and SSI  Cirrhosis Hepatocellular carcinoma ?Patient on Rifaximin as an outpatient. On Plavix as an outpatient secondary to progressive clotting of portal/mesenteric vasculature -Continue Plavix  OSA -Continue CPAP  Morbid obesity Body mass index is 43.69 kg/m.   DVT prophylaxis: Lovenox Code Status:   Code Status: DNR Family Communication: None at bedside Disposition Plan: Home pending cardiology recommendations/management   Consultants:    Cardiology  Procedures:   Transthoracic Echocardiogram (04/15/18) Study Conclusions  - Left ventricle: Very poor echo window. In spite of contrast   injection, wall motion was difficult to be seen. Grossly appears   to have low normal LVEF to mildly reduced. The cavity size was   mildly dilated. There was mild concentric hypertrophy. Systolic   function was mildly reduced. The estimated ejection fraction was   in the range of 45% to 50%. There was an increased relative   contribution of atrial contraction to ventricular filling.   Doppler parameters are consistent with a reversible restrictive   pattern, indicative of decreased left ventricular diastolic   compliance and/or increased left atrial pressure (grade 3   diastolic dysfunction). Doppler parameters are consistent with   both elevated ventricular end-diastolic filling pressure and   elevated left atrial filling pressure. - Aortic valve: There was moderate stenosis by mean PG and peak PG.   Consider TEE to better evaluate the AV structure. Mean gradient   (S): 22 mm Hg. Peak gradient (S): 39 mm Hg. Valve area (VTI):   1.24 cm^2. Valve area (Vmax): 1.14 cm^2. Valve area (Vmean): 1.08   cm^2. - Mitral valve: Calcified annulus. Valve area by continuity   equation (using LVOT flow): 2.27 cm^2. - Left atrium: The atrium was mildly dilated.   Transesophageal Echocardiogram (04/16/2018) **Pending**   Antimicrobials:  None    Subjective: No issues currently. No chest pain or dyspnea.  Objective: Vitals:   04/16/18 0945 04/16/18 0951 04/16/18 1000 04/16/18 1124  BP: 112/77 108/77 114/72 96/67  Pulse: 84 81 84 89  Resp: 17 17 14 20   Temp:  98 F (36.7 C)  (!) 97.5 F (36.4 C)  TempSrc:  Oral  Oral  SpO2: 97% 92% 94% 97%  Weight:      Height:        Intake/Output Summary (Last 24 hours) at 04/16/2018 1310 Last data filed at 04/16/2018 0902 Gross per 24 hour  Intake 243 ml  Output 1450 ml  Net -1207 ml   Filed  Weights   04/14/18 1118 04/15/18 0002 04/16/18 0439  Weight: (!) 142.9 kg (!) 138.9 kg (!) 138.1 kg    Examination:  General exam: Appears calm and comfortable Respiratory system: Clear to auscultation. Respiratory effort normal. Cardiovascular system: S1 & S2 heard, RRR. Gastrointestinal system: Abdomen is nondistended, soft and nontender. No organomegaly or masses felt. Normal bowel sounds heard. Central nervous system: Alert and oriented. No focal neurological deficits. Extremities: No calf tenderness Skin: No cyanosis. No rashes Psychiatry: Judgement and insight appear normal. Mood & affect appropriate.     Data Reviewed: I have personally reviewed following labs and imaging studies  CBC: Recent Labs  Lab 04/14/18 1142 04/15/18 0638 04/16/18 0423  WBC 3.5* 2.7* 4.2  NEUTROABS  --  1.6*  --   HGB 11.8* 10.8* 11.5*  HCT 37.8* 32.9* 35.9*  MCV 100.0 96.2 96.0  PLT 75* 73* 95*   Basic Metabolic Panel: Recent Labs  Lab 04/14/18 1142 04/15/18 0638 04/16/18 0423  NA 139 141 138  K 4.1 3.4* 3.7  CL 107 105 102  CO2 24 28 25   GLUCOSE 146* 123* 115*  BUN 13 13 16   CREATININE 0.94 1.08 1.28*  CALCIUM 8.7* 8.3* 8.4*   GFR: Estimated Creatinine Clearance: 69.8 mL/min (A) (by C-G formula based on SCr of 1.28 mg/dL (H)). Liver Function Tests: No results for input(s): AST, ALT, ALKPHOS, BILITOT, PROT, ALBUMIN in the last 168 hours. No results for input(s): LIPASE, AMYLASE in the last 168 hours. Recent Labs  Lab 04/16/18 0423  AMMONIA 62*   Coagulation Profile: No results for input(s): INR, PROTIME in the last 168 hours. Cardiac Enzymes: Recent Labs  Lab 04/15/18 1135  TROPONINI 0.03*   BNP (last 3 results) No results for input(s): PROBNP in the last 8760 hours. HbA1C: Recent Labs    04/15/18 0638  HGBA1C 6.0*   CBG: Recent Labs  Lab 04/15/18 1344 04/15/18 1842 04/15/18 2122 04/16/18 0733 04/16/18 1120  GLUCAP 211* 181* 142* 101* 154*   Lipid  Profile: No results for input(s): CHOL, HDL, LDLCALC, TRIG, CHOLHDL, LDLDIRECT in the last 72 hours. Thyroid Function Tests: No results for input(s): TSH, T4TOTAL, FREET4, T3FREE, THYROIDAB in the last 72 hours. Anemia Panel: No results for input(s): VITAMINB12, FOLATE, FERRITIN, TIBC, IRON, RETICCTPCT in the last 72 hours. Sepsis Labs: No results for input(s): PROCALCITON, LATICACIDVEN in the last 168 hours.  No results found for this or any previous visit (from the past 240 hour(s)).       Radiology Studies: No results found.      Scheduled Meds: . amitriptyline  37.5 mg Oral QHS  . aspirin EC  81 mg Oral Daily  . carvedilol  3.125 mg Oral BID WC  . clopidogrel  75 mg Oral Daily  . enoxaparin (LOVENOX) injection  40 mg Subcutaneous Q24H  . finasteride  5 mg Oral Daily  . folic acid  1 mg Oral Daily  . [START ON 04/17/2018] furosemide  40 mg Intravenous Daily  . insulin aspart  0-15 Units Subcutaneous TID WC  . insulin aspart  0-5 Units Subcutaneous QHS  . insulin glargine  42 Units Subcutaneous BID  . lactulose  15 g Oral BID  . lisinopril  2.5 mg Oral Daily  . memantine  20 mg Oral QHS  . pantoprazole  40 mg Oral Daily  . [START ON 04/17/2018] potassium chloride  20 mEq Oral Daily  . pregabalin  300 mg Oral BID  . rifaximin  550 mg Oral BID  . simvastatin  10 mg Oral QPM  . sodium chloride flush  3 mL Intravenous Q12H  . tamsulosin  0.4 mg Oral QPC supper   Continuous Infusions: . sodium chloride       LOS: 1 day     Cordelia Poche, MD [facility] 04/16/2018, 1:10 PM  If 7PM-7AM, please contact night-coverage [email or web address]

## 2018-04-16 NOTE — Progress Notes (Signed)
Pt states it's okay for wife to sign consent form for cath.

## 2018-04-16 NOTE — Progress Notes (Signed)
Pt reminded at beginning of shift of the need to keep measuring urine. Pt has had urinal in room. Measuring hat was placed in pt's bathroom toilet. Will continue to monitor.

## 2018-04-16 NOTE — Progress Notes (Signed)
  Echocardiogram Echocardiogram Transesophageal has been performed.  Keith [name] 04/16/2018, 11:00 AM

## 2018-04-16 NOTE — Progress Notes (Signed)
RN rounded on pt. Pt states he does not need anything at this time. 

## 2018-04-16 NOTE — Interval H&P Note (Signed)
History and Physical Interval Note:  04/16/2018 9:14 AM  Keith [name]  has presented today for surgery, with the diagnosis of aortic stenosis  The various methods of treatment have been discussed with the patient and family. After consideration of risks, benefits and other options for treatment, the patient has consented to  Procedure(s): TRANSESOPHAGEAL ECHOCARDIOGRAM (TEE) (N/A) as a surgical intervention .  [name] history has been reviewed, patient examined, no change in status, stable for surgery.  I have reviewed the patient's chart and labs.  Questions were answered to the patient's satisfaction.     Adrian Prows

## 2018-04-16 NOTE — Progress Notes (Signed)
Pt refuses cpap

## 2018-04-16 NOTE — Progress Notes (Addendum)
Patient with new onset cardiomyopathy, he has had coronary angiography in 2018 which did not reveal significant disease, had preserved LVEF, hence the cardiomyopathy is new.  I clearly suspect his symptoms are related to critical illness clinically.  I will request Dr. Sherren Mocha et al to evaluate the patient for TAVR.  For now I have made changes to his diuretics, continue the same.  Would not discharge him until seen by the TAVR team, his blood pressure being soft, he probably may not do well without definite therapy.  Risk of syncope and recurrence of CHF fairly high.   I will continue DAPT until evaluated by TAVR team.  I discussed with him at the bedside and his wife Baker Janus over the telephone and explained to her regarding his situation.  I have discussed with him regarding need for right and left heart catheterization in view of new decreased LVEF.  This will be set up for tomorrow morning.  All his care has been provided for at [facility], I would prefer them to stay here in [place] to have the procedure done.  Scheduled Meds: . amitriptyline  37.5 mg Oral QHS  . aspirin EC  81 mg Oral Daily  . carvedilol  3.125 mg Oral BID WC  . clopidogrel  75 mg Oral Daily  . enoxaparin (LOVENOX) injection  40 mg Subcutaneous Q24H  . finasteride  5 mg Oral Daily  . folic acid  1 mg Oral Daily  . [START ON 04/17/2018] furosemide  40 mg Intravenous Daily  . insulin aspart  0-15 Units Subcutaneous TID WC  . insulin aspart  0-5 Units Subcutaneous QHS  . insulin glargine  42 Units Subcutaneous BID  . lactulose  15 g Oral BID  . lisinopril  2.5 mg Oral Daily  . memantine  20 mg Oral QHS  . pantoprazole  40 mg Oral Daily  . [START ON 04/17/2018] potassium chloride  20 mEq Oral Daily  . pregabalin  300 mg Oral BID  . rifaximin  550 mg Oral BID  . simvastatin  10 mg Oral QPM  . sodium chloride flush  3 mL Intravenous Q12H  . tamsulosin  0.4 mg Oral QPC supper   Continuous Infusions: . sodium  chloride     PRN Meds:.sodium chloride, acetaminophen, ondansetron (ZOFRAN) IV, sodium chloride flush, traMADol   Adrian Prows, MD 04/16/2018, 11:21 AM [facility]. [place] Pager: [phone] Office: [phone] If no answer: Cell:  [phone]

## 2018-04-16 NOTE — H&P (View-Only) (Signed)
Patient with new onset cardiomyopathy, he has had coronary angiography in 2018 which did not reveal significant disease, had preserved LVEF, hence the cardiomyopathy is new.  I clearly suspect his symptoms are related to critical illness clinically.  I will request Dr. Sherren Mocha et al to evaluate the patient for TAVR.  For now I have made changes to his diuretics, continue the same.  Would not discharge him until seen by the TAVR team, his blood pressure being soft, he probably may not do well without definite therapy.  Risk of syncope and recurrence of CHF fairly high.   I will continue DAPT until evaluated by TAVR team.  I discussed with him at the bedside and his wife Baker Janus over the telephone and explained to her regarding his situation.  I have discussed with him regarding need for right and left heart catheterization in view of new decreased LVEF.  This will be set up for tomorrow morning.  All his care has been provided for at [facility], I would prefer them to stay here in [place] to have the procedure done.  Scheduled Meds: . amitriptyline  37.5 mg Oral QHS  . aspirin EC  81 mg Oral Daily  . carvedilol  3.125 mg Oral BID WC  . clopidogrel  75 mg Oral Daily  . enoxaparin (LOVENOX) injection  40 mg Subcutaneous Q24H  . finasteride  5 mg Oral Daily  . folic acid  1 mg Oral Daily  . [START ON 04/17/2018] furosemide  40 mg Intravenous Daily  . insulin aspart  0-15 Units Subcutaneous TID WC  . insulin aspart  0-5 Units Subcutaneous QHS  . insulin glargine  42 Units Subcutaneous BID  . lactulose  15 g Oral BID  . lisinopril  2.5 mg Oral Daily  . memantine  20 mg Oral QHS  . pantoprazole  40 mg Oral Daily  . [START ON 04/17/2018] potassium chloride  20 mEq Oral Daily  . pregabalin  300 mg Oral BID  . rifaximin  550 mg Oral BID  . simvastatin  10 mg Oral QPM  . sodium chloride flush  3 mL Intravenous Q12H  . tamsulosin  0.4 mg Oral QPC supper   Continuous Infusions: . sodium  chloride     PRN Meds:.sodium chloride, acetaminophen, ondansetron (ZOFRAN) IV, sodium chloride flush, traMADol   Adrian Prows, MD 04/16/2018, 11:21 AM [facility]. [place] Pager: [phone] Office: [phone] If no answer: Cell:  [phone]

## 2018-04-17 ENCOUNTER — Encounter (HOSPITAL_COMMUNITY)
Admission: EM | Disposition: A | Discharge status: Home Health | Payer: Self-pay | Source: Home / Self Care | Attending: Family Medicine

## 2018-04-17 ENCOUNTER — Other Ambulatory Visit: Payer: Self-pay | Admitting: Physician Assistant

## 2018-04-17 ENCOUNTER — Encounter (HOSPITAL_COMMUNITY): Payer: Self-pay | Admitting: Cardiology

## 2018-04-17 DIAGNOSIS — J810 Acute pulmonary edema: Secondary | ICD-10-CM

## 2018-04-17 DIAGNOSIS — I350 Nonrheumatic aortic (valve) stenosis: Secondary | ICD-10-CM

## 2018-04-17 HISTORY — PX: CORONARY PRESSURE/FFR STUDY: CATH118243

## 2018-04-17 HISTORY — PX: RIGHT/LEFT HEART CATH AND CORONARY ANGIOGRAPHY: CATH118266

## 2018-04-17 LAB — POCT I-STAT 3, ART BLOOD GAS (G3+)
Acid-Base Excess: 2 mmol/L (ref 0.0–2.0)
Bicarbonate: 26.4 mmol/L (ref 20.0–28.0)
O2 SAT: 95 %
TCO2: 28 mmol/L (ref 22–32)
pCO2 arterial: 41.4 mmHg (ref 32.0–48.0)
pH, Arterial: 7.412 (ref 7.350–7.450)
pO2, Arterial: 76 mmHg — ABNORMAL LOW (ref 83.0–108.0)

## 2018-04-17 LAB — POCT I-STAT 3, VENOUS BLOOD GAS (G3P V)
Acid-Base Excess: 1 mmol/L (ref 0.0–2.0)
Acid-Base Excess: 2 mmol/L (ref 0.0–2.0)
BICARBONATE: 26.9 mmol/L (ref 20.0–28.0)
Bicarbonate: 27.8 mmol/L (ref 20.0–28.0)
O2 Saturation: 61 %
O2 Saturation: 83 %
PCO2 VEN: 49.1 mmHg (ref 44.0–60.0)
TCO2: 28 mmol/L (ref 22–32)
TCO2: 29 mmol/L (ref 22–32)
pCO2, Ven: 45.3 mmHg (ref 44.0–60.0)
pH, Ven: 7.361 (ref 7.250–7.430)
pH, Ven: 7.382 (ref 7.250–7.430)
pO2, Ven: 33 mmHg (ref 32.0–45.0)
pO2, Ven: 48 mmHg — ABNORMAL HIGH (ref 32.0–45.0)

## 2018-04-17 LAB — CBC
HCT: 36.3 % — ABNORMAL LOW (ref 39.0–52.0)
Hemoglobin: 11.8 g/dL — ABNORMAL LOW (ref 13.0–17.0)
MCH: 30.7 pg (ref 26.0–34.0)
MCHC: 32.5 g/dL (ref 30.0–36.0)
MCV: 94.5 fL (ref 80.0–100.0)
Platelets: 74 10*3/uL — ABNORMAL LOW (ref 150–400)
RBC: 3.84 MIL/uL — ABNORMAL LOW (ref 4.22–5.81)
RDW: 14.9 % (ref 11.5–15.5)
WBC: 3.1 10*3/uL — ABNORMAL LOW (ref 4.0–10.5)
nRBC: 0 % (ref 0.0–0.2)

## 2018-04-17 LAB — CREATININE, SERUM
Creatinine, Ser: 1.09 mg/dL (ref 0.61–1.24)
GFR calc non Af Amer: >60 mL/min (ref >60)

## 2018-04-17 LAB — BASIC METABOLIC PANEL
Anion gap: 7 (ref 5–15)
BUN: 15 mg/dL (ref 8–23)
CALCIUM: 8.2 mg/dL — AB (ref 8.9–10.3)
CO2: 27 mmol/L (ref 22–32)
Chloride: 103 mmol/L (ref 98–111)
Creatinine, Ser: 1.08 mg/dL (ref 0.61–1.24)
GFR calc Af Amer: >60 mL/min (ref >60)
GFR calc non Af Amer: >60 mL/min (ref >60)
GLUCOSE: 136 mg/dL — AB (ref 70–99)
Potassium: 3.5 mmol/L (ref 3.5–5.1)
Sodium: 137 mmol/L (ref 135–145)

## 2018-04-17 LAB — GLUCOSE, CAPILLARY
GLUCOSE-CAPILLARY: 166 mg/dL — AB (ref 70–99)
Glucose-Capillary: 110 mg/dL — ABNORMAL HIGH (ref 70–99)
Glucose-Capillary: 258 mg/dL — ABNORMAL HIGH (ref 70–99)
Glucose-Capillary: 287 mg/dL — ABNORMAL HIGH (ref 70–99)

## 2018-04-17 LAB — POCT ACTIVATED CLOTTING TIME: Activated Clotting Time: 219 seconds

## 2018-04-17 SURGERY — RIGHT/LEFT HEART CATH AND CORONARY ANGIOGRAPHY
Anesthesia: LOCAL

## 2018-04-17 MED ORDER — FUROSEMIDE 10 MG/ML IJ SOLN
40.0000 mg | Freq: Two times a day (BID) | INTRAMUSCULAR | Status: AC
  Administered 2018-04-17 – 2018-04-18 (×2): 40 mg via INTRAVENOUS
  Filled 2018-04-17 (×2): qty 4

## 2018-04-17 MED ORDER — ADENOSINE (DIAGNOSTIC) 140MCG/KG/MIN
INTRAVENOUS | Status: DC | PRN
  Administered 2018-04-17: 140 ug/kg/min via INTRAVENOUS

## 2018-04-17 MED ORDER — IOHEXOL 350 MG/ML SOLN
INTRAVENOUS | Status: DC | PRN
  Administered 2018-04-17: 75 mL via INTRA_ARTERIAL

## 2018-04-17 MED ORDER — HYDROCODONE-ACETAMINOPHEN 5-325 MG PO TABS
1.0000 | ORAL_TABLET | Freq: Four times a day (QID) | ORAL | Status: DC | PRN
  Administered 2018-04-17: 2 via ORAL
  Administered 2018-04-18 (×2): 1 via ORAL
  Filled 2018-04-17: qty 2
  Filled 2018-04-17 (×2): qty 1

## 2018-04-17 MED ORDER — VERAPAMIL HCL 2.5 MG/ML IV SOLN
INTRAVENOUS | Status: DC | PRN
  Administered 2018-04-17: 3 mL via INTRA_ARTERIAL

## 2018-04-17 MED ORDER — ASPIRIN 81 MG PO CHEW
81.0000 mg | CHEWABLE_TABLET | ORAL | Status: AC
  Administered 2018-04-17: 81 mg via ORAL
  Filled 2018-04-17: qty 1

## 2018-04-17 MED ORDER — MIDAZOLAM HCL 2 MG/2ML IJ SOLN
INTRAMUSCULAR | Status: AC
  Filled 2018-04-17: qty 2

## 2018-04-17 MED ORDER — METHYLPREDNISOLONE SODIUM SUCC 125 MG IJ SOLR
125.0000 mg | Freq: Once | INTRAMUSCULAR | Status: AC
  Administered 2018-04-17: 125 mg via INTRAVENOUS
  Filled 2018-04-17: qty 2

## 2018-04-17 MED ORDER — HEPARIN SODIUM (PORCINE) 1000 UNIT/ML IJ SOLN
INTRAMUSCULAR | Status: DC | PRN
  Administered 2018-04-17: 2000 [IU] via INTRAVENOUS

## 2018-04-17 MED ORDER — HEPARIN SODIUM (PORCINE) 1000 UNIT/ML IJ SOLN
INTRAMUSCULAR | Status: AC
  Filled 2018-04-17: qty 1

## 2018-04-17 MED ORDER — SODIUM CHLORIDE 0.9% FLUSH
3.0000 mL | Freq: Two times a day (BID) | INTRAVENOUS | Status: DC
  Administered 2018-04-18 – 2018-04-19 (×3): 3 mL via INTRAVENOUS

## 2018-04-17 MED ORDER — HEPARIN (PORCINE) IN NACL 1000-0.9 UT/500ML-% IV SOLN
INTRAVENOUS | Status: DC | PRN
  Administered 2018-04-17: 500 mL

## 2018-04-17 MED ORDER — DIPHENHYDRAMINE HCL 50 MG/ML IJ SOLN
25.0000 mg | Freq: Once | INTRAMUSCULAR | Status: AC
  Administered 2018-04-17: 25 mg via INTRAVENOUS
  Filled 2018-04-17: qty 1

## 2018-04-17 MED ORDER — MIDAZOLAM HCL 2 MG/2ML IJ SOLN
INTRAMUSCULAR | Status: DC | PRN
  Administered 2018-04-17: 1 mg via INTRAVENOUS

## 2018-04-17 MED ORDER — LIDOCAINE HCL (PF) 1 % IJ SOLN
INTRAMUSCULAR | Status: DC | PRN
  Administered 2018-04-17 (×2): 2 mL

## 2018-04-17 MED ORDER — SODIUM CHLORIDE 0.9% FLUSH: 3.0000 mL | INTRAVENOUS | Status: DC | PRN

## 2018-04-17 MED ORDER — ADENOSINE 12 MG/4ML IV SOLN
INTRAVENOUS | Status: AC
  Filled 2018-04-17: qty 16

## 2018-04-17 MED ORDER — VERAPAMIL HCL 2.5 MG/ML IV SOLN
INTRAVENOUS | Status: AC
  Filled 2018-04-17: qty 2

## 2018-04-17 MED ORDER — HEPARIN (PORCINE) IN NACL 1000-0.9 UT/500ML-% IV SOLN
INTRAVENOUS | Status: AC
  Filled 2018-04-17: qty 1000

## 2018-04-17 MED ORDER — SODIUM CHLORIDE 0.9 % IV SOLN: 250.0000 mL | INTRAVENOUS | Status: DC | PRN

## 2018-04-17 MED ORDER — HEPARIN SODIUM (PORCINE) 1000 UNIT/ML IJ SOLN
INTRAMUSCULAR | Status: DC | PRN
  Administered 2018-04-17: 3000 [IU] via INTRAVENOUS
  Administered 2018-04-17: 5000 [IU] via INTRAVENOUS

## 2018-04-17 MED ORDER — CLOPIDOGREL BISULFATE 75 MG PO TABS
75.0000 mg | ORAL_TABLET | ORAL | Status: AC
  Administered 2018-04-17: 75 mg via ORAL
  Filled 2018-04-17: qty 1

## 2018-04-17 MED ORDER — LIDOCAINE HCL (PF) 1 % IJ SOLN
INTRAMUSCULAR | Status: AC
  Filled 2018-04-17: qty 30

## 2018-04-17 SURGICAL SUPPLY — 18 items
CATH BALLN WEDGE 5F 110CM (CATHETERS) ×1 IMPLANT
CATH INFINITI 5FR AL1 (CATHETERS) IMPLANT
CATH LAUNCHER 5F AL1 (CATHETERS) IMPLANT
CATH OPTITORQUE TIG 4.0 5F (CATHETERS) ×1 IMPLANT
CATHETER LAUNCHER 5F AL1 (CATHETERS) ×2
DEVICE RAD COMP TR BAND LRG (VASCULAR PRODUCTS) ×1 IMPLANT
GLIDESHEATH SLEND A-KIT 6F 20G (SHEATH) ×1 IMPLANT
GUIDEWIRE INQWIRE 1.5J.035X260 (WIRE) IMPLANT
GUIDEWIRE PRESSURE COMET II (WIRE) ×1 IMPLANT
HOVERMATT SINGLE USE (MISCELLANEOUS) ×1 IMPLANT
INQWIRE 1.5J .035X260CM (WIRE) ×2
KIT ESSENTIALS PG (KITS) ×1 IMPLANT
KIT HEART LEFT (KITS) ×2 IMPLANT
PACK CARDIAC CATHETERIZATION (CUSTOM PROCEDURE TRAY) ×2 IMPLANT
SHEATH GLIDE SLENDER 4/5FR (SHEATH) ×1 IMPLANT
TRANSDUCER W/STOPCOCK (MISCELLANEOUS) ×2 IMPLANT
TUBING CIL FLEX 10 FLL-RA (TUBING) ×2 IMPLANT
WIRE HI TORQ VERSACORE-J 145CM (WIRE) ×1 IMPLANT

## 2018-04-17 NOTE — Interval H&P Note (Signed)
History and Physical Interval Note:  04/17/2018 9:27 AM  Keith [name]  has presented today for surgery, with the diagnosis of as  The various methods of treatment have been discussed with the patient and family. After consideration of risks, benefits and other options for treatment, the patient has consented to  Procedure(s): RIGHT/LEFT HEART CATH AND CORONARY ANGIOGRAPHY (N/A) as a surgical intervention .  [name] history has been reviewed, patient examined, no change in status, stable for surgery.  I have reviewed the patient's chart and labs.  Questions were answered to the patient's satisfaction.     Adrian Prows

## 2018-04-17 NOTE — Consult Note (Signed)
   [facility] CM Inpatient Consult   04/17/2018  Keith [name] 1940-10-18 [id number]  Patient screened for high risk score for unplanned readmission noted with [facility] plan.  Patient has returned from procedure and 'do not disturb' sign on his door. Chart review reveals patient is admitted for the evaluation of new LV systolic dysfunction with low flow low gradient aortic stenosis.  Patient with a HX of but not limited to chronic diastolic CHF, DMT2 with neuropathy and orthostasis, HTN, HLD, history of TIA (2007), history of hepatocellular carcinoma s/p partial resection (9983), and follicular lymphoma s/p chemo at [facility] per MD charted notes.  Spoke with nurse who states patient just back from cath lab and emotional right now.  States she doesn't anticipate any needs.  Wife is a retired [employer] scientist. If there are any needs for community please refer.      For questions contact:   Natividad Brood, RN BSN [facility] Liaison  [phone] business mobile phone Toll free office [phone]

## 2018-04-17 NOTE — Care Management Important Message (Signed)
Important Message  Patient Details  Name: Keith [name] MRN: [id number] Date of Birth: [birth year]   Medicare Important Message Given:  Yes    Tirth Cothron P Steele 04/17/2018, 11:46 AM

## 2018-04-17 NOTE — Progress Notes (Signed)
PROGRESS NOTE    TEDRIC LEETH  PJS:[id number] DOB: Jan 11, 1941 DOA: 04/14/2018 PCP: Haywood Pao, MD   Brief Narrative: Keith [name] is a 78 y.o. male with medical history significant of CVA; hepatic CA; cirrhosis of the liver; and DM. Patient presented secondary to heart failure. Concern for symptomatic aortic stenosis.   Assessment & Plan:   Principal Problem:   Acute respiratory distress Active Problems:   Hypertension   Hyperlipidemia   Cirrhosis of liver (HCC)   OSA (obstructive sleep apnea)   Pacemaker   Hepatic cancer (HCC)   Acute exacerbation of CHF (congestive heart failure) (HCC)   Diabetes mellitus type 2 in obese (HCC)   Obesity, Class III, BMI 40-49.9 (morbid obesity) ([place])   Aortic stenosis   AV block   Chest pressure   Acute combined systolic/diastolic heart failure Reason for patient's shortness of breath. No current hypoxia. Cardiology consulted. Patient has been on IV lasix for treatment. Transthoracic Echocardiogram significant for an EF of 45-50% EF and grade 3 diastolic dysfunction -Cardiology recommendations: Continue lasix IV today -Continue lisinopril, aspirin -Beta-blocker held secondary to hypotension  Aortic stenosis Moderate. Cardiology following and recommending TAVR.  Second degree AV block Patient with a pacemaker.  Essential hypertension Well controlled -Continue lisinopril  Hyperlipidemia -Continue Zocor  Diabetes mellitus, type 2 Last hemoglobin A1c of 6.6%.  -Continue Lantus and SSI  Cirrhosis Hepatocellular carcinoma ?Patient on Rifaximin as an outpatient. On Plavix as an outpatient secondary to progressive clotting of portal/mesenteric vasculature -Continue Plavix  OSA -Continue CPAP  Morbid obesity Body mass index is 43.83 kg/m.   DVT prophylaxis: Lovenox Code Status:   Code Status: DNR Family Communication: None at bedside Disposition Plan: Home pending cardiology  recommendations/management   Consultants:   Cardiology  Procedures:   Transthoracic Echocardiogram (04/15/18) Study Conclusions  - Left ventricle: Very poor echo window. In spite of contrast   injection, wall motion was difficult to be seen. Grossly appears   to have low normal LVEF to mildly reduced. The cavity size was   mildly dilated. There was mild concentric hypertrophy. Systolic   function was mildly reduced. The estimated ejection fraction was   in the range of 45% to 50%. There was an increased relative   contribution of atrial contraction to ventricular filling.   Doppler parameters are consistent with a reversible restrictive   pattern, indicative of decreased left ventricular diastolic   compliance and/or increased left atrial pressure (grade 3   diastolic dysfunction). Doppler parameters are consistent with   both elevated ventricular end-diastolic filling pressure and   elevated left atrial filling pressure. - Aortic valve: There was moderate stenosis by mean PG and peak PG.   Consider TEE to better evaluate the AV structure. Mean gradient   (S): 22 mm Hg. Peak gradient (S): 39 mm Hg. Valve area (VTI):   1.24 cm^2. Valve area (Vmax): 1.14 cm^2. Valve area (Vmean): 1.08   cm^2. - Mitral valve: Calcified annulus. Valve area by continuity   equation (using LVOT flow): 2.27 cm^2. - Left atrium: The atrium was mildly dilated.   Transesophageal Echocardiogram (04/16/2018) TEE: Indication: Aortic stenosis. Under moderate sedation, using 2 mg versed and 25 mg benadryl IV,  TEE was performed without complications: LV: LVH. Global hypokinesis. EF 35-40%.. RV: Normal. Pacemaker lead noted.  LA: Normal. Left atrial appendage: Normal without thrombus. Normal function. Inter atrial septum shows a small PFO with Color Doppler e/o left to right shunt.  RA: Normal, Eusthesian valve noted.  MV: Mild MR, central. TV: Normal Trace TR AV: Severely calcified. Trileaflet. Low gradient  severe AS. Peak PG of 37 and mean PG is 21 mm Hg. AVA 1.02 cm2 by continuity. Planimetry valve area 0.7 cm2. PV: Normal. Trace PI.  Thoracic and ascending aorta: Diffuse mild atheresclerotic changes noted.   Right and left heart catheterization 04/17/2018: RA 13/12, mean 11 mmHg.;  RV 50/12, EDP 5 mmHg.;  PA 49/13, mean 35 mmHg, PA saturation 61%.;  PW 28/39, mean 28 mmHg.  Aortic saturation was 95%. CO 6.34, CI 2.53.  QP/QS 1.00. Aortic peak to peak gradient 30 mmHg, mean 25.1 mmHg, valve area 1.10 cm.  Left main: Mildly calcified.  No significant disease. LAD: Mild to moderately calcified.  Ostial 50 to 60% stenosis.  DFR 0.95, FFR 0.89, not hemodynamically significant.  Mild diffuse disease throughout the LAD.  Large D1. RI: Mild diffuse disease. CX: Mild calcification, no significant disease. RCA: Very large and ectatic, moderately calcified diffusely.  Slow flow is evident.  No high-grade stenosis is evident.  Large PL and PDA branches.  Impression: No significant coronary artery disease, moderate disease in the ostial LAD.  Moderate aortic stenosis.  Moderate pulmonary hypertension with elevated moderate LVEDP.  Recommendation: Patient will be diuresed today, he will potentially be discharged home in the morning, TAVR team is evaluating the patient for possible TAVR.   Antimicrobials:  None    Subjective: No concerns. Going down for heart cath  Objective: Vitals:   04/17/18 1116 04/17/18 1146 04/17/18 1224 04/17/18 1518  BP: 105/69 104/75 115/82 116/84  Pulse: 80 81 84 86  Resp:      Temp: 97.7 F (36.5 C)   98.1 F (36.7 C)  TempSrc: Oral   Oral  SpO2: 95% 94% 97% 96%  Weight:      Height:        Intake/Output Summary (Last 24 hours) at 04/17/2018 1712 Last data filed at 04/17/2018 1613 Gross per 24 hour  Intake 1464.73 ml  Output 1300 ml  Net 164.73 ml   Filed Weights   04/15/18 0002 04/16/18 0439 04/17/18 0143  Weight: (!) 138.9 kg (!) 138.1 kg (!)  138.6 kg    Examination:  General exam: Appears calm and comfortable    Data Reviewed: I have personally reviewed following labs and imaging studies  CBC: Recent Labs  Lab 04/14/18 1142 04/15/18 0638 04/16/18 0423 04/17/18 1226  WBC 3.5* 2.7* 4.2 3.1*  NEUTROABS  --  1.6*  --   --   HGB 11.8* 10.8* 11.5* 11.8*  HCT 37.8* 32.9* 35.9* 36.3*  MCV 100.0 96.2 96.0 94.5  PLT 75* 73* 95* 74*   Basic Metabolic Panel: Recent Labs  Lab 04/14/18 1142 04/15/18 0638 04/16/18 0423 04/17/18 0531 04/17/18 1226  NA 139 141 138 137  --   K 4.1 3.4* 3.7 3.5  --   CL 107 105 102 103  --   CO2 24 28 25 27   --   GLUCOSE 146* 123* 115* 136*  --   BUN 13 13 16 15   --   CREATININE 0.94 1.08 1.28* 1.08 1.09  CALCIUM 8.7* 8.3* 8.4* 8.2*  --    GFR: Estimated Creatinine Clearance: 82.2 mL/min (by C-G formula based on SCr of 1.09 mg/dL). Liver Function Tests: No results for input(s): AST, ALT, ALKPHOS, BILITOT, PROT, ALBUMIN in the last 168 hours. No results for input(s): LIPASE, AMYLASE in the last 168 hours. Recent Labs  Lab 04/16/18 0423  AMMONIA 62*  Coagulation Profile: No results for input(s): INR, PROTIME in the last 168 hours. Cardiac Enzymes: Recent Labs  Lab 04/15/18 1135  TROPONINI 0.03*   BNP (last 3 results) No results for input(s): PROBNP in the last 8760 hours. HbA1C: Recent Labs    04/15/18 0638  HGBA1C 6.0*   CBG: Recent Labs  Lab 04/16/18 1622 04/16/18 2123 04/17/18 0731 04/17/18 1137 04/17/18 1642  GLUCAP 183* 154* 110* 166* 258*   Lipid Profile: No results for input(s): CHOL, HDL, LDLCALC, TRIG, CHOLHDL, LDLDIRECT in the last 72 hours. Thyroid Function Tests: No results for input(s): TSH, T4TOTAL, FREET4, T3FREE, THYROIDAB in the last 72 hours. Anemia Panel: No results for input(s): VITAMINB12, FOLATE, FERRITIN, TIBC, IRON, RETICCTPCT in the last 72 hours. Sepsis Labs: No results for input(s): PROCALCITON, LATICACIDVEN in the last 168  hours.  No results found for this or any previous visit (from the past 240 hour(s)).       Radiology Studies: No results found.      Scheduled Meds: . amitriptyline  37.5 mg Oral QHS  . aspirin EC  81 mg Oral Daily  . carvedilol  3.125 mg Oral BID WC  . clopidogrel  75 mg Oral Daily  . enoxaparin (LOVENOX) injection  40 mg Subcutaneous Q24H  . finasteride  5 mg Oral Daily  . folic acid  1 mg Oral Daily  . furosemide  40 mg Intravenous Q12H  . insulin aspart  0-15 Units Subcutaneous TID WC  . insulin aspart  0-5 Units Subcutaneous QHS  . insulin glargine  42 Units Subcutaneous BID  . lactulose  15 g Oral BID  . lisinopril  2.5 mg Oral Daily  . memantine  20 mg Oral QHS  . pantoprazole  40 mg Oral Daily  . potassium chloride  20 mEq Oral Daily  . pregabalin  300 mg Oral BID  . rifaximin  550 mg Oral BID  . simvastatin  10 mg Oral QPM  . sodium chloride flush  3 mL Intravenous Q12H  . sodium chloride flush  3 mL Intravenous Q12H  . tamsulosin  0.4 mg Oral QPC supper   Continuous Infusions: . sodium chloride    . sodium chloride       LOS: 2 days     Cordelia Poche, MD [facility] 04/17/2018, 5:12 PM  If 7PM-7AM, please contact night-coverage [email or web address]

## 2018-04-18 ENCOUNTER — Encounter (HOSPITAL_COMMUNITY): Payer: Self-pay | Admitting: Cardiology

## 2018-04-18 LAB — GLUCOSE, CAPILLARY
Glucose-Capillary: 210 mg/dL — ABNORMAL HIGH (ref 70–99)
Glucose-Capillary: 234 mg/dL — ABNORMAL HIGH (ref 70–99)
Glucose-Capillary: 212 mg/dL — ABNORMAL HIGH (ref 70–99)
Glucose-Capillary: 152 mg/dL — ABNORMAL HIGH (ref 70–99)

## 2018-04-18 MED ORDER — ALUM & MAG HYDROXIDE-SIMETH 200-200-20 MG/5ML PO SUSP
30.0000 mL | ORAL | Status: DC | PRN
  Administered 2018-04-18: 30 mL via ORAL
  Filled 2018-04-18: qty 30

## 2018-04-18 MED ORDER — ASPIRIN 81 MG PO TBEC: 81.0000 mg | DELAYED_RELEASE_TABLET | Freq: Every day | ORAL | 0 refills | Status: DC

## 2018-04-18 MED ORDER — POTASSIUM CHLORIDE CRYS ER 10 MEQ PO TBCR: 10.0000 meq | EXTENDED_RELEASE_TABLET | Freq: Every day | ORAL | 0 refills | Status: DC

## 2018-04-18 MED ORDER — LACTULOSE 10 GM/15ML PO SOLN: 15.0000 g | Freq: Two times a day (BID) | ORAL | Status: DC

## 2018-04-18 MED ORDER — FUROSEMIDE 20 MG PO TABS: 20.0000 mg | ORAL_TABLET | Freq: Every day | ORAL | Status: DC

## 2018-04-18 MED ORDER — CARVEDILOL 3.125 MG PO TABS: 3.1250 mg | ORAL_TABLET | Freq: Two times a day (BID) | ORAL | 0 refills | Status: DC

## 2018-04-18 NOTE — Progress Notes (Addendum)
Subjective:  Feels much better.  Dyspnea has improved.  Wife is present at the bedside.  Intake/Output from previous day:  I/O last 3 completed shifts: In: 1464.7 [P.O.:1320; I.V.:144.7] Out: 1950 [[id number]] Total I/O In: 240 [P.O.:240] Out: -   Blood pressure 110/77, pulse 79, temperature (!) 97.5 F (36.4 C), temperature source Oral, resp. rate 18, height 5' 10"  (1.778 m), weight (!) 137 kg, SpO2 93 %. JVD difficult to make out due to short neck  Cardiovascular: Normal rate, regular rhythm and normal heart sounds.  Pulses:      Carotid pulses are 3+ on the right side and 3+ on the left side.      Radial pulses are 3+ on the right side and 3+ on the left side.       Femoral pulses are 2+ on the right side and 2+ on the left side.      Left popliteal pulse not accessible.       Dorsalis pedis pulses are 1+ on the right side and 2+ on the left side.       Posterior tibial pulses are 0 on the right side and 1+ on the left side.  Pulmonary/Chest: Effort normal. He has rales (Bilateral basal crackles heard). He exhibits no tenderness.  Abdominal: Soft. Bowel sounds are normal.  Pannus present, nontender, no obvious organomegaly  Musculoskeletal: Normal range of motion.        General: Edema (1+ bilateral.  Chronic venous stasis dermatitis noted.) present.  Neurological: He is alert and oriented to person, place, and time.  Skin: Skin is warm.  Psychiatric: He has a normal mood and affect.  Lab Results: BMP BNP (last 3 results) Recent Labs    12/17/17 1518 04/14/18 1142 04/16/18 0423  BNP 93.8 435.8* 96.5    ProBNP (last 3 results) No results for input(s): PROBNP in the last 8760 hours. BMP Latest Ref Rng & Units 04/17/2018 04/17/2018 04/16/2018  Glucose 70 - 99 mg/dL - 136(H) 115(H)  BUN 8 - 23 mg/dL - 15 16  Creatinine 0.61 - 1.24 mg/dL 1.09 1.08 1.28(H)  Sodium 135 - 145 mmol/L - 137 138  Potassium 3.5 - 5.1 mmol/L - 3.5 3.7  Chloride 98 - 111 mmol/L - 103 102  CO2 22 -  32 mmol/L - 27 25  Calcium 8.9 - 10.3 mg/dL - 8.2(L) 8.4(L)   Hepatic Function Latest Ref Rng & Units 12/18/2017 12/17/2017 11/26/2017  Total Protein 6.5 - 8.1 g/dL 5.4(L) 5.7(L) 5.9(L)  Albumin 3.5 - 5.0 g/dL 2.6(L) 2.8(L) 2.6(L)  AST 15 - 41 U/L 29 39 27  ALT 0 - 44 U/L 21 24 18   Alk Phosphatase 38 - 126 U/L 77 82 77  Total Bilirubin 0.3 - 1.2 mg/dL 1.1 1.6(H) 1.4(H)  Bilirubin, Direct 0.0 - 0.2 mg/dL 0.3(H) 0.4(H) -   CBC Latest Ref Rng & Units 04/17/2018 04/16/2018 04/15/2018  WBC 4.0 - 10.5 K/uL 3.1(L) 4.2 2.7(L)  Hemoglobin 13.0 - 17.0 g/dL 11.8(L) 11.5(L) 10.8(L)  Hematocrit 39.0 - 52.0 % 36.3(L) 35.9(L) 32.9(L)  Platelets 150 - 400 K/uL 74(L) 95(L) 73(L)   Lipid Panel     Component Value Date/Time   CHOL 149 11/23/2017 1404   TRIG 79 11/23/2017 1404   HDL 37 (L) 11/23/2017 1404   CHOLHDL 4.0 11/23/2017 1404   VLDL 16 11/23/2017 1404   LDLCALC 96 11/23/2017 1404   Cardiac Panel (last 3 results) Recent Labs    04/15/18 1135  TROPONINI 0.03*    HEMOGLOBIN  A1C Lab Results  Component Value Date   HGBA1C 6.0 (H) 04/15/2018   MPG 125.5 04/15/2018   TSH Recent Labs    11/23/17 1404  TSH 1.943   Cardiac Studies:  Right and left heart catheterization 04/17/2018: RA 13/12, mean 11 mmHg.;  RV 50/12, EDP 5 mmHg.;  PA 49/13, mean 35 mmHg, PA saturation 61%.;  PW 28/39, mean 28 mmHg.  Aortic saturation was 95%. CO 6.34, CI 2.53.  QP/QS 1.00. Aortic peak to peak gradient 30 mmHg, mean 25.1 mmHg, valve area 1.10 cm.  Left main: Mildly calcified.  No significant disease. LAD: Mild to moderately calcified.  Ostial 50 to 60% stenosis.  DFR 0.95, FFR 0.89, not hemodynamically significant.  Mild diffuse disease throughout the LAD.  Large D1. RI: Mild diffuse disease. CX: Mild calcification, no significant disease. RCA: Very large and ectatic, moderately calcified diffusely.  Slow flow is evident.  No high-grade stenosis is evident.  Large PL and PDA branches.  Impression: No  significant coronary artery disease, moderate disease in the ostial LAD.  Moderate aortic stenosis.  Moderate pulmonary hypertension with elevated moderate LVEDP.  Recommendation: Patient will be diuresed today, he will potentially be discharged home in the morning, TAVR team is evaluating the patient for possible TAVR.  TEE 04/16/2018: LVEF  35 to 40% % with global hypokinesis.  Trileaflet aortic valve with severe calcific degeneration.  Severe aortic stenosis, calculated aortic valve area 0.99 cm.  Planimetry valve area 0.7 cm.  Mild MR and TR.  Mild to moderate aortic atherosclerosis. Peak PG of 37 and mean PG is 21 mm Hg.  EKG 04/15/2018: Sinus rhythm with first-degree AV block, ventricularly paced rhythm.  No further analysis.  Assessment/Plan:  1.  Acute systolic and diastolic heart failure 2.  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 3.  Second-degree AV block S/P Medtronic Advisa [id number] dual chamber pacemaker with Medtronic 5076 atrial lead and Medtronic 3830 HIS lead, all implanted 06/25/16. 4.  Diabetes mellitus type 2 controlled without hyperglycemia, on insulin without complications. 5.  Morbid obesity and obstructive sleep apnea, not on CPAP. 6.  Acute renal failure, stage III due to aggressive diuresis.  Recommendation: Patient can be discharged home today, I suspect his creatinine will improve, will hold off on getting CT angiogram of the chest in view of increasing serum creatinine for now and will be done in the outpatient elective basis in preparation for TAVR.  I will discontinue lisinopril in view of ARF and also low blood pressure, continue low-dose carvedilol.  Patient's wife is a Therapist, sports, she is aware to hold Lasix if systolic blood pressure is less than 90 mmHg.  He is advised to contact us immediately if he develops worsening dyspnea, worsening leg edema. I will also discontinue spironolactone for now.   Cardiac medications for discharge would be aspirin 81 mg, Plavix 75 mg daily, Coreg 3.125 mg p.o. twice daily, Lasix 20 mg p.o. daily, potassium 10 mEq p.o. daily.  Adrian Prows, M.D. 04/18/2018, 11:14 AM [facility] Pager: [phone] Office: [phone] If no answer: [phone]

## 2018-04-18 NOTE — Discharge Instructions (Addendum)
Keith [name],  You were in the hospital because of a concern for your heart. There is concern that you may need a valve replacement to help with some of your symptoms. This will be worked up as an outpatient by cardiology. Please do not take: spironolactone, lasix, potassium (kdur) as these medications have been discontinued.

## 2018-04-18 NOTE — Progress Notes (Signed)
Pt had refused CPAP- none at bedside.

## 2018-04-18 NOTE — Progress Notes (Signed)
Patient is sleepy with low blood pressure BS 200s, MD aware see flow sheet for updated vitals. Wife has left the bedside and plan to come back if patient is discharged today. Paged MD for update and plan for d/c.

## 2018-04-18 NOTE — Progress Notes (Signed)
PROGRESS NOTE    Keith [name]  WJX:[id number] DOB: [birth year] DOA: 04/14/2018 PCP: Haywood Pao, MD   Brief Narrative: Keith [name] is a 78 y.o. male with medical history significant of CVA; hepatic CA; cirrhosis of the liver; and DM. Patient presented secondary to heart failure. Concern for symptomatic aortic stenosis.   Assessment & Plan:   Principal Problem:   Acute respiratory distress Active Problems:   Hypertension   Hyperlipidemia   Cirrhosis of liver (HCC)   OSA (obstructive sleep apnea)   Pacemaker   Hepatic cancer (HCC)   Acute exacerbation of CHF (congestive heart failure) (HCC)   Diabetes mellitus type 2 in obese (HCC)   Obesity, Class III, BMI 40-49.9 (morbid obesity) ([place])   Aortic stenosis   AV block   Chest pressure   Acute combined systolic/diastolic heart failure Reason for patient's shortness of breath. No current hypoxia. Cardiology consulted. Patient has been on IV lasix for treatment. Transthoracic Echocardiogram significant for an EF of 45-50% EF and grade 3 diastolic dysfunction -Cardiology recommendations: Continue lasix IV today -Continue lisinopril, aspirin -Beta-blocker held secondary to hypotension  Aortic stenosis Moderate. Cardiology following and recommending TAVR.  Second degree AV block Patient with a pacemaker.  Essential hypertension Well controlled -Continue lisinopril  Hyperlipidemia -Continue Zocor  Diabetes mellitus, type 2 Last hemoglobin A1c of 6.6%.  -Continue Lantus and SSI  Cirrhosis Hepatocellular carcinoma ?Patient on Rifaximin as an outpatient. On Plavix as an outpatient secondary to progressive clotting of portal/mesenteric vasculature -Continue Plavix  OSA -Continue CPAP  Morbid obesity Body mass index is 43.35 kg/m.   Lightheadedness In setting of soft blood pressure. Not orthostatic. Fall risk. Associated somnolence. May be in setting of diuresis and antihypertensives. On room  air. -Watch overnight   DVT prophylaxis: Lovenox Code Status:   Code Status: DNR Family Communication: None at bedside Disposition Plan: Home tomorrow once BP improved   Consultants:   Cardiology  Procedures:   Transthoracic Echocardiogram (04/15/18) Study Conclusions  - Left ventricle: Very poor echo window. In spite of contrast   injection, wall motion was difficult to be seen. Grossly appears   to have low normal LVEF to mildly reduced. The cavity size was   mildly dilated. There was mild concentric hypertrophy. Systolic   function was mildly reduced. The estimated ejection fraction was   in the range of 45% to 50%. There was an increased relative   contribution of atrial contraction to ventricular filling.   Doppler parameters are consistent with a reversible restrictive   pattern, indicative of decreased left ventricular diastolic   compliance and/or increased left atrial pressure (grade 3   diastolic dysfunction). Doppler parameters are consistent with   both elevated ventricular end-diastolic filling pressure and   elevated left atrial filling pressure. - Aortic valve: There was moderate stenosis by mean PG and peak PG.   Consider TEE to better evaluate the AV structure. Mean gradient   (S): 22 mm Hg. Peak gradient (S): 39 mm Hg. Valve area (VTI):   1.24 cm^2. Valve area (Vmax): 1.14 cm^2. Valve area (Vmean): 1.08   cm^2. - Mitral valve: Calcified annulus. Valve area by continuity   equation (using LVOT flow): 2.27 cm^2. - Left atrium: The atrium was mildly dilated.   Transesophageal Echocardiogram (04/16/2018) TEE: Indication: Aortic stenosis. Under moderate sedation, using 2 mg versed and 25 mg benadryl IV,  TEE was performed without complications: LV: LVH. Global hypokinesis. EF 35-40%.. RV: Normal. Pacemaker lead noted.  LA: Normal.  Left atrial appendage: Normal without thrombus. Normal function. Inter atrial septum shows a small PFO with Color Doppler e/o left  to right shunt.  RA: Normal, Eusthesian valve noted. MV: Mild MR, central. TV: Normal Trace TR AV: Severely calcified. Trileaflet. Low gradient severe AS. Peak PG of 37 and mean PG is 21 mm Hg. AVA 1.02 cm2 by continuity. Planimetry valve area 0.7 cm2. PV: Normal. Trace PI.  Thoracic and ascending aorta: Diffuse mild atheresclerotic changes noted.   Right and left heart catheterization 04/17/2018: RA 13/12, mean 11 mmHg.;  RV 50/12, EDP 5 mmHg.;  PA 49/13, mean 35 mmHg, PA saturation 61%.;  PW 28/39, mean 28 mmHg.  Aortic saturation was 95%. CO 6.34, CI 2.53.  QP/QS 1.00. Aortic peak to peak gradient 30 mmHg, mean 25.1 mmHg, valve area 1.10 cm.  Left main: Mildly calcified.  No significant disease. LAD: Mild to moderately calcified.  Ostial 50 to 60% stenosis.  DFR 0.95, FFR 0.89, not hemodynamically significant.  Mild diffuse disease throughout the LAD.  Large D1. RI: Mild diffuse disease. CX: Mild calcification, no significant disease. RCA: Very large and ectatic, moderately calcified diffusely.  Slow flow is evident.  No high-grade stenosis is evident.  Large PL and PDA branches.  Impression: No significant coronary artery disease, moderate disease in the ostial LAD.  Moderate aortic stenosis.  Moderate pulmonary hypertension with elevated moderate LVEDP.  Recommendation: Patient will be diuresed today, he will potentially be discharged home in the morning, TAVR team is evaluating the patient for possible TAVR.   Antimicrobials:  None    Subjective: Hoping to go home. No chest pain or dyspnea.  Objective: Vitals:   04/18/18 1156 04/18/18 1431 04/18/18 1433 04/18/18 1436  BP: 101/65 (!) 88/61 100/67 (!) 91/56  Pulse: 77 73 73 72  Resp: 18     Temp: (!) 97.3 F (36.3 C)     TempSrc: Oral     SpO2: 100% 100%    Weight:      Height:        Intake/Output Summary (Last 24 hours) at 04/18/2018 1556 Last data filed at 04/18/2018 0947 Gross per 24 hour  Intake 720 ml    Output 850 ml  Net -130 ml   Filed Weights   04/16/18 0439 04/17/18 0143 04/18/18 0449  Weight: (!) 138.1 kg (!) 138.6 kg (!) 137 kg    Examination:  General exam: Appears calm and comfortable Respiratory system: bilateral rales on auscultation. Respiratory effort normal. Cardiovascular system: S1 & S2 heard, RRR. Gastrointestinal system: Abdomen is nondistended, soft and nontender. No organomegaly or masses felt. Normal bowel sounds heard. Central nervous system: Alert and oriented. No focal neurological deficits. Extremities: No edema. No calf tenderness Skin: No cyanosis. No rashes Psychiatry: Judgement and insight appear normal. Mood & affect appropriate.    Data Reviewed: I have personally reviewed following labs and imaging studies  CBC: Recent Labs  Lab 04/14/18 1142 04/15/18 0638 04/16/18 0423 04/17/18 1226  WBC 3.5* 2.7* 4.2 3.1*  NEUTROABS  --  1.6*  --   --   HGB 11.8* 10.8* 11.5* 11.8*  HCT 37.8* 32.9* 35.9* 36.3*  MCV 100.0 96.2 96.0 94.5  PLT 75* 73* 95* 74*   Basic Metabolic Panel: Recent Labs  Lab 04/14/18 1142 04/15/18 0638 04/16/18 0423 04/17/18 0531 04/17/18 1226  NA 139 141 138 137  --   K 4.1 3.4* 3.7 3.5  --   CL 107 105 102 103  --   CO2  24 28 25 27   --   GLUCOSE 146* 123* 115* 136*  --   BUN 13 13 16 15   --   CREATININE 0.94 1.08 1.28* 1.08 1.09  CALCIUM 8.7* 8.3* 8.4* 8.2*  --    GFR: Estimated Creatinine Clearance: 81.7 mL/min (by C-G formula based on SCr of 1.09 mg/dL). Liver Function Tests: No results for input(s): AST, ALT, ALKPHOS, BILITOT, PROT, ALBUMIN in the last 168 hours. No results for input(s): LIPASE, AMYLASE in the last 168 hours. Recent Labs  Lab 04/16/18 0423  AMMONIA 62*   Coagulation Profile: No results for input(s): INR, PROTIME in the last 168 hours. Cardiac Enzymes: Recent Labs  Lab 04/15/18 1135  TROPONINI 0.03*   BNP (last 3 results) No results for input(s): PROBNP in the last 8760  hours. HbA1C: No results for input(s): HGBA1C in the last 72 hours. CBG: Recent Labs  Lab 04/17/18 1137 04/17/18 1642 04/17/18 2137 04/18/18 0741 04/18/18 1127  GLUCAP 166* 258* 287* 210* 234*   Lipid Profile: No results for input(s): CHOL, HDL, LDLCALC, TRIG, CHOLHDL, LDLDIRECT in the last 72 hours. Thyroid Function Tests: No results for input(s): TSH, T4TOTAL, FREET4, T3FREE, THYROIDAB in the last 72 hours. Anemia Panel: No results for input(s): VITAMINB12, FOLATE, FERRITIN, TIBC, IRON, RETICCTPCT in the last 72 hours. Sepsis Labs: No results for input(s): PROCALCITON, LATICACIDVEN in the last 168 hours.  No results found for this or any previous visit (from the past 240 hour(s)).       Radiology Studies: No results found.      Scheduled Meds: . amitriptyline  37.5 mg Oral QHS  . aspirin EC  81 mg Oral Daily  . carvedilol  3.125 mg Oral BID WC  . clopidogrel  75 mg Oral Daily  . enoxaparin (LOVENOX) injection  40 mg Subcutaneous Q24H  . finasteride  5 mg Oral Daily  . folic acid  1 mg Oral Daily  . insulin aspart  0-15 Units Subcutaneous TID WC  . insulin aspart  0-5 Units Subcutaneous QHS  . insulin glargine  42 Units Subcutaneous BID  . lactulose  15 g Oral BID  . memantine  20 mg Oral QHS  . pantoprazole  40 mg Oral Daily  . potassium chloride  20 mEq Oral Daily  . pregabalin  300 mg Oral BID  . rifaximin  550 mg Oral BID  . simvastatin  10 mg Oral QPM  . sodium chloride flush  3 mL Intravenous Q12H  . sodium chloride flush  3 mL Intravenous Q12H  . tamsulosin  0.4 mg Oral QPC supper   Continuous Infusions: . sodium chloride    . sodium chloride       LOS: 3 days     Cordelia Poche, MD [facility] 04/18/2018, 3:56 PM  If 7PM-7AM, please contact night-coverage [email or web address]

## 2018-04-18 NOTE — Progress Notes (Signed)
1st of 2 IV Lasix doses given at [facility] yesterday after pt had heart cath. 2nd dose to be given this AM. Will continue to monitor.

## 2018-04-18 NOTE — Discharge Summary (Signed)
Physician Discharge Summary  Keith [name] TGG:[id number] DOB: 03-05-1941 DOA: 04/14/2018  PCP: Haywood Pao, MD  Admit date: 04/14/2018 Discharge date: 04/20/2018  Admitted From: Home Disposition: Home  Recommendations for Outpatient Follow-up:  1. Follow up with PCP in 1 week 2. Follow up with Dr. Einar Gip and [facility] for follow-up of heart failure management and workup for TAVR 3. Please obtain BMP/CBC in one week 4. Please follow up on the following pending results: None  Discharge Condition: Stable CODE STATUS: DNR Diet recommendation: Heart healthy   Brief/Interim Summary:  Admission HPI written by Karmen Bongo, MD   Chief Complaint: SOB  HPI: Keith [name] is a 78 y.o. male with medical history significant of CVA; hepatic CA; cirrhosis of the liver; and DM presenting with SOB.  He went to the [place] today for a routine appointment.  Yesterday, he had SOB and was wheezing - this was new for him.  The wheezing worsened through the day and was present throughout the night.  They went to the [place] and to the ER there.  They sent him to [facility] because they weren't able to care for him with the problem he had.  No chest pain.  He has chronic edema, no recent change.  No weight change.  No orthopnea.  No PND, including last night.  His Lasix was changed to 1/2 strength (20 mg) and now every other day instead of daily, changed some months ago because "I was collapsing and they finally got me in the hospital here and found out that I had a urinary tract infection".    ED Course:  Fluid overload, ?CHF.  No h/o CHF but on Lasix QOD (decreased from daily due to orthostasis).  Elevated BNP.  Minimal ambulation.  Obs, echo recommended.    Hospital course:  Acute combined systolic/diastolic heart failure Reason for patient's shortness of breath. No current hypoxia. Cardiology consulted. Patient has been on IV lasix for treatment. Transthoracic Echocardiogram significant  for an EF of 45-50% EF and grade 3 diastolic dysfunction. Patient initially managed on lisinopril, spironolactone and Coreg but had subsequent hypotension.  Lisinopril, Coreg, spironolactone discontinued on discharge.  Aortic stenosis Moderate. Cardiology following and recommending TAVR.  Second degree AV block Patient with a pacemaker.  Essential hypertension Some hypotension secondary to Coreg and Lisinopril which has resolved after discontinuing antihypertensives.  Hypotension As mentioned above. Resolved prior to discharge. Patient asymptomatic with ambulation. Orthostatic vitals normal.  Hyperlipidemia Continued Zocor  Diabetes mellitus, type 2 Last hemoglobin A1c of 6.6%. Continued Lantus and SSI  Cirrhosis Hepatocellular carcinoma Patient on Rifaximin as an outpatient. On Plavix as an outpatient secondary to progressive clotting of portal/mesenteric vasculature. Continued Plavix, Rifaximin and Lactulose, although patient is refusing lactulose  OSA Continue CPAP  Morbid obesity Body mass index is 43.83 kg/m  Discharge Diagnoses:  Principal Problem:   Acute respiratory distress Active Problems:   Hypertension   Hyperlipidemia   Cirrhosis of liver (HCC)   OSA (obstructive sleep apnea)   Pacemaker   Hepatic cancer (HCC)   Acute exacerbation of CHF (congestive heart failure) (HCC)   Diabetes mellitus type 2 in obese (HCC)   Obesity, Class III, BMI 40-49.9 (morbid obesity) ([place])   Aortic stenosis   AV block   Chest pressure    Discharge Instructions  Discharge Instructions    Call MD for:  difficulty breathing, headache or visual disturbances   Complete by:  As directed    Diet - low  sodium heart healthy   Complete by:  As directed      Allergies as of 04/20/2018      Reactions   Contrast Media [iodinated Diagnostic Agents] Rash   Cannot have contrast   Fentanyl Itching, Rash   Gadolinium Rash   Iodine Rash   Merbromin Rash   Other Rash    "Opioids"      Medication List    STOP taking these medications   furosemide 20 MG tablet Commonly known as:  LASIX   spironolactone 25 MG tablet Commonly known as:  ALDACTONE     TAKE these medications   albuterol 108 (90 Base) MCG/ACT inhaler Commonly known as:  PROVENTIL HFA;VENTOLIN HFA Inhale 2 puffs into the lungs every 4 (four) hours as needed for wheezing or shortness of breath.   amitriptyline 25 MG tablet Commonly known as:  ELAVIL Take 25 mg by mouth at bedtime.   aspirin 81 MG EC tablet Take 1 tablet (81 mg total) by mouth daily.   clopidogrel 75 MG tablet Commonly known as:  PLAVIX Take 75 mg by mouth daily.   cyanocobalamin 1000 MCG/ML injection Commonly known as:  (VITAMIN B-12) Inject 1,000 mcg into the muscle every 30 (thirty) days.   finasteride 5 MG tablet Commonly known as:  PROSCAR Take 5 mg by mouth every evening.   folic acid 1 MG tablet Commonly known as:  FOLVITE Take 1 mg by mouth daily.   hydrocerin Crea Apply 1 application topically daily as needed (dry skin).   insulin aspart 100 UNIT/ML injection Commonly known as:  novoLOG Inject 5-30 Units into the skin See admin instructions. Inject 5-30 units into the skin three times a day before meals, per sliding scale   insulin glargine 100 UNIT/ML injection Commonly known as:  LANTUS Inject 40 Units into the skin See admin instructions. Inject 40 units into the skin 2 times a day- 10 AM and bedtime   lactulose 10 GM/15ML solution Commonly known as:  CHRONULAC Take 22.5 mLs (15 g total) by mouth 2 (two) times daily. TITRATE TO 3 BMs/day   memantine 10 MG tablet Commonly known as:  NAMENDA Take 20 mg by mouth at bedtime.   pantoprazole 40 MG tablet Commonly known as:  PROTONIX Take 40 mg by mouth daily.   polyvinyl alcohol 1.4 % ophthalmic solution Commonly known as:  LIQUIFILM TEARS Place 1 drop into both eyes 2 (two) times daily as needed (dry eyes).   predniSONE 50 MG  tablet Commonly known as:  DELTASONE Take 1 tablet (50 mg total) by mouth as directed.   pregabalin 300 MG capsule Commonly known as:  LYRICA Take 300 mg by mouth 2 (two) times daily.   rifaximin 550 MG Tabs tablet Commonly known as:  XIFAXAN Take 550 mg by mouth 2 (two) times daily.   simvastatin 20 MG tablet Commonly known as:  ZOCOR Take 10 mg by mouth every evening.   tamsulosin 0.4 MG Caps capsule Commonly known as:  FLOMAX Take 0.4 mg by mouth every evening.   traMADol 50 MG tablet Commonly known as:  ULTRAM Take 1 tablet (50 mg total) by mouth every 12 (twelve) hours as needed for moderate pain. What changed:  when to take this   Vitamin D3 50 MCG (2000 UT) Tabs Take 2,000 Units by mouth daily.      Follow-up Information    Tisovec, Fransico Him, MD.   Specialty:  Internal Medicine Contact information: [address] [place] [address]  [phone]        Call Adrian Prows, MD.   Specialty:  Cardiology Why:  To be seen in 10 days post discharge Contact information: [place] [address] [phone]        Sherren Mocha, MD Follow up.   Specialty:  Cardiology Why:  Please refer to the printed instructions. Please call [phone] with any questions or concerns. Contact information: [address] [place] [address] [phone]          Allergies  Allergen Reactions  . Contrast Media [Iodinated Diagnostic Agents] Rash    Cannot have contrast  . Fentanyl Itching and Rash  . Gadolinium Rash  . Iodine Rash  . Merbromin Rash  . Other Rash    "Opioids"    Consultations:  Cardiology   Procedures/Studies: Dg Chest 2 View  Result Date: 04/14/2018 CLINICAL DATA:  Cough and shortness of breath. EXAM: CHEST - 2 VIEW COMPARISON:  12/17/2017 FINDINGS: The heart size and pulmonary vascularity are normal. Aortic atherosclerosis. Pacemaker in place. There are new minimal bilateral pleural effusions. There  is increased peribronchial thickening and interstitial accentuation bilaterally without consolidative infiltrates. No acute bone abnormality. IMPRESSION: 1. New minimal bilateral pleural effusions. 2. Increased bronchitic changes. 3.  Aortic Atherosclerosis (ICD10-I70.0). Electronically Signed   By: Lorriane Shire M.D.   On: 04/14/2018 11:40     Subjective: No chest pain or dyspnea.  Discharge Exam: Vitals:   04/20/18 0854 04/20/18 1201  BP: 114/71 101/67  Pulse: 75 80  Resp:    Temp:    SpO2:  96%   Vitals:   04/20/18 0519 04/20/18 0521 04/20/18 0854 04/20/18 1201  BP:  108/73 114/71 101/67  Pulse:  82 75 80  Resp:  18    Temp:  97.9 F (36.6 C)    TempSrc:  Oral    SpO2:  99%  96%  Weight: (!) 138.3 kg     Height:        General: Pt is alert, awake, not in acute distress Cardiovascular: RRR, S1/S2 +, no rubs, no gallops Respiratory: Bilateral rales Abdominal: Soft, NT, ND, bowel sounds +, obese Extremities: pitting edema, no cyanosis, chronic venous stasis changes    The results of significant diagnostics from this hospitalization (including imaging, microbiology, ancillary and laboratory) are listed below for reference.     Microbiology: No results found for this or any previous visit (from the past 240 hour(s)).   Labs: BNP (last 3 results) Recent Labs    12/17/17 1518 04/14/18 1142 04/16/18 0423  BNP 93.8 435.8* 85.2   Basic Metabolic Panel: Recent Labs  Lab 04/14/18 1142 04/15/18 0638 04/16/18 0423 04/17/18 0531 04/17/18 1226  NA 139 141 138 137  --   K 4.1 3.4* 3.7 3.5  --   CL 107 105 102 103  --   CO2 24 28 25 27   --   GLUCOSE 146* 123* 115* 136*  --   BUN 13 13 16 15   --   CREATININE 0.94 1.08 1.28* 1.08 1.09  CALCIUM 8.7* 8.3* 8.4* 8.2*  --    Liver Function Tests: No results for input(s): AST, ALT, ALKPHOS, BILITOT, PROT, ALBUMIN in the last 168 hours. No results for input(s): LIPASE, AMYLASE in the last 168 hours. Recent Labs  Lab  04/16/18 0423  AMMONIA 62*   CBC: Recent Labs  Lab 04/14/18 1142 04/15/18 0638 04/16/18 0423 04/17/18 1226  WBC 3.5* 2.7* 4.2 3.1*  NEUTROABS  --  1.6*  --   --   HGB 11.8* 10.8* 11.5* 11.8*  HCT 37.8* 32.9* 35.9* 36.3*  MCV 100.0 96.2 96.0 94.5  PLT 75* 73* 95* 74*   Cardiac Enzymes: Recent Labs  Lab 04/15/18 1135  TROPONINI 0.03*   BNP: Invalid input(s): POCBNP CBG: Recent Labs  Lab 04/19/18 1625 04/19/18 2132 04/20/18 0738 04/20/18 0806 04/20/18 1105  GLUCAP 110* 147* 59* 69* 115*   Urinalysis    Component Value Date/Time   COLORURINE YELLOW 12/17/2017 [place] 12/17/2017 1654   LABSPEC 1.005 12/17/2017 1654   PHURINE 6.0 12/17/2017 1654   GLUCOSEU NEGATIVE 12/17/2017 1654   HGBUR NEGATIVE 12/17/2017 1654   BILIRUBINUR NEGATIVE 12/17/2017 1654   KETONESUR NEGATIVE 12/17/2017 1654   PROTEINUR NEGATIVE 12/17/2017 1654   UROBILINOGEN 0.2 12/03/2012 1338   NITRITE NEGATIVE 12/17/2017 1654   LEUKOCYTESUR NEGATIVE 12/17/2017 1654     SIGNED:   Cordelia Poche, MD [facility] 04/20/2018, 12:19 PM

## 2018-04-19 LAB — GLUCOSE, CAPILLARY
GLUCOSE-CAPILLARY: 88 mg/dL (ref 70–99)
Glucose-Capillary: 95 mg/dL (ref 70–99)
Glucose-Capillary: 110 mg/dL — ABNORMAL HIGH (ref 70–99)
Glucose-Capillary: 147 mg/dL — ABNORMAL HIGH (ref 70–99)

## 2018-04-19 MED ORDER — SODIUM CHLORIDE 0.9 % IV BOLUS
300.0000 mL | Freq: Once | INTRAVENOUS | Status: AC
  Administered 2018-04-19: 13:00:00 via INTRAVENOUS

## 2018-04-19 NOTE — Progress Notes (Signed)
Informed MD Netty of current blood pressure

## 2018-04-19 NOTE — Progress Notes (Signed)
SATURATION QUALIFICATIONS: (This note is used to comply with regulatory documentation for home oxygen)  Patient Saturations on Room Air at Rest = 98%  Patient Saturations on Room Air while Ambulating = 92%   Pt walked 150 feet, tolerated well with walker  Pt felt dizzy upon standing and then stated it subsided after one minute  Pt stated his legs felt weak during the walk  Pt in bed resting now   Paged MD to inform  Wife at bedside asking about discharge

## 2018-04-19 NOTE — Progress Notes (Signed)
Pt refused CPAP

## 2018-04-19 NOTE — Progress Notes (Signed)
PROGRESS NOTE    Keith [name]  CXK:[id number] DOB: 02-13-1941 DOA: 04/14/2018 PCP: Haywood Pao, MD   Brief Narrative: Keith [name] is a 78 y.o. male with medical history significant of CVA; hepatic CA; cirrhosis of the liver; and DM. Patient presented secondary to heart failure. Concern for symptomatic aortic stenosis.   Assessment & Plan:   Principal Problem:   Acute respiratory distress Active Problems:   Hypertension   Hyperlipidemia   Cirrhosis of liver (HCC)   OSA (obstructive sleep apnea)   Pacemaker   Hepatic cancer (HCC)   Acute exacerbation of CHF (congestive heart failure) (HCC)   Diabetes mellitus type 2 in obese (HCC)   Obesity, Class III, BMI 40-49.9 (morbid obesity) ([place])   Aortic stenosis   AV block   Chest pressure   Acute combined systolic/diastolic heart failure Reason for patient's shortness of breath. No current hypoxia. Cardiology consulted. Patient has been on IV lasix for treatment. Transthoracic Echocardiogram significant for an EF of 45-50% EF and grade 3 diastolic dysfunction -Cardiology recommendations: Signed off -Discontinued lasix and Coreg in setting of hypotension  Hypotension Secondary to lisinopril, Coreg, Lasix in setting of aortic stenosis -Hold all antihypertensives and watch overnight -Orthostatic vitals  Aortic stenosis Moderate. Cardiology following and recommending TAVR.  Second degree AV block Patient with a pacemaker.  Essential hypertension Well controlled -Continue lisinopril  Hyperlipidemia -Continue Zocor  Diabetes mellitus, type 2 Last hemoglobin A1c of 6.6%.  -Continue Lantus and SSI  Cirrhosis Hepatocellular carcinoma ?Patient on Rifaximin as an outpatient. On Plavix as an outpatient secondary to progressive clotting of portal/mesenteric vasculature -Continue Plavix  OSA -Continue CPAP  Morbid obesity Body mass index is 43.78 kg/m.   Lightheadedness Secondary to hypotension as  mentioned above   DVT prophylaxis: Lovenox Code Status:   Code Status: DNR Family Communication: None at bedside. Wife on telephone Disposition Plan: Home tomorrow once BP improved   Consultants:   Cardiology  Procedures:   Transthoracic Echocardiogram (04/15/18) Study Conclusions  - Left ventricle: Very poor echo window. In spite of contrast   injection, wall motion was difficult to be seen. Grossly appears   to have low normal LVEF to mildly reduced. The cavity size was   mildly dilated. There was mild concentric hypertrophy. Systolic   function was mildly reduced. The estimated ejection fraction was   in the range of 45% to 50%. There was an increased relative   contribution of atrial contraction to ventricular filling.   Doppler parameters are consistent with a reversible restrictive   pattern, indicative of decreased left ventricular diastolic   compliance and/or increased left atrial pressure (grade 3   diastolic dysfunction). Doppler parameters are consistent with   both elevated ventricular end-diastolic filling pressure and   elevated left atrial filling pressure. - Aortic valve: There was moderate stenosis by mean PG and peak PG.   Consider TEE to better evaluate the AV structure. Mean gradient   (S): 22 mm Hg. Peak gradient (S): 39 mm Hg. Valve area (VTI):   1.24 cm^2. Valve area (Vmax): 1.14 cm^2. Valve area (Vmean): 1.08   cm^2. - Mitral valve: Calcified annulus. Valve area by continuity   equation (using LVOT flow): 2.27 cm^2. - Left atrium: The atrium was mildly dilated.   Transesophageal Echocardiogram (04/16/2018) TEE: Indication: Aortic stenosis. Under moderate sedation, using 2 mg versed and 25 mg benadryl IV,  TEE was performed without complications: LV: LVH. Global hypokinesis. EF 35-40%.. RV: Normal. Pacemaker lead noted.  LA: Normal. Left atrial appendage: Normal without thrombus. Normal function. Inter atrial septum shows a small PFO with Color  Doppler e/o left to right shunt.  RA: Normal, Eusthesian valve noted. MV: Mild MR, central. TV: Normal Trace TR AV: Severely calcified. Trileaflet. Low gradient severe AS. Peak PG of 37 and mean PG is 21 mm Hg. AVA 1.02 cm2 by continuity. Planimetry valve area 0.7 cm2. PV: Normal. Trace PI.  Thoracic and ascending aorta: Diffuse mild atheresclerotic changes noted.   Right and left heart catheterization 04/17/2018: RA 13/12, mean 11 mmHg.;  RV 50/12, EDP 5 mmHg.;  PA 49/13, mean 35 mmHg, PA saturation 61%.;  PW 28/39, mean 28 mmHg.  Aortic saturation was 95%. CO 6.34, CI 2.53.  QP/QS 1.00. Aortic peak to peak gradient 30 mmHg, mean 25.1 mmHg, valve area 1.10 cm.  Left main: Mildly calcified.  No significant disease. LAD: Mild to moderately calcified.  Ostial 50 to 60% stenosis.  DFR 0.95, FFR 0.89, not hemodynamically significant.  Mild diffuse disease throughout the LAD.  Large D1. RI: Mild diffuse disease. CX: Mild calcification, no significant disease. RCA: Very large and ectatic, moderately calcified diffusely.  Slow flow is evident.  No high-grade stenosis is evident.  Large PL and PDA branches.  Impression: No significant coronary artery disease, moderate disease in the ostial LAD.  Moderate aortic stenosis.  Moderate pulmonary hypertension with elevated moderate LVEDP.  Recommendation: Patient will be diuresed today, he will potentially be discharged home in the morning, TAVR team is evaluating the patient for possible TAVR.   Antimicrobials:  None    Subjective: No concerns overnight.  Objective: Vitals:   04/18/18 1958 04/19/18 0622 04/19/18 0820 04/19/18 1126  BP: 101/71 112/80 97/66 (!) 83/59  Pulse: 74 79 70 67  Resp: 18 18  20   Temp: 97.6 F (36.4 C) 97.7 F (36.5 C)  98.1 F (36.7 C)  TempSrc:  Oral  Oral  SpO2: 98% 95%  98%  Weight:  (!) 138.4 kg    Height:        Intake/Output Summary (Last 24 hours) at 04/19/2018 1230 Last data filed at  04/19/2018 0740 Gross per 24 hour  Intake 480 ml  Output -  Net 480 ml   Filed Weights   04/17/18 0143 04/18/18 0449 04/19/18 0622  Weight: (!) 138.6 kg (!) 137 kg (!) 138.4 kg    Examination:  General exam: Appears calm and comfortable Respiratory system: Clear to auscultation. Respiratory effort normal. Cardiovascular system: S1 & S2 heard, RRR. No murmurs, rubs, gallops or clicks. Gastrointestinal system: Abdomen is nondistended, soft and nontender. No organomegaly or masses felt. Normal bowel sounds heard. Central nervous system: Alert and oriented. No focal neurological deficits. Extremities: No calf tenderness Skin: No cyanosis. No rashes Psychiatry: Judgement and insight appear normal. Mood & affect appropriate.     Data Reviewed: I have personally reviewed following labs and imaging studies  CBC: Recent Labs  Lab 04/14/18 1142 04/15/18 0638 04/16/18 0423 04/17/18 1226  WBC 3.5* 2.7* 4.2 3.1*  NEUTROABS  --  1.6*  --   --   HGB 11.8* 10.8* 11.5* 11.8*  HCT 37.8* 32.9* 35.9* 36.3*  MCV 100.0 96.2 96.0 94.5  PLT 75* 73* 95* 74*   Basic Metabolic Panel: Recent Labs  Lab 04/14/18 1142 04/15/18 0638 04/16/18 0423 04/17/18 0531 04/17/18 1226  NA 139 141 138 137  --   K 4.1 3.4* 3.7 3.5  --   CL 107 105 102 103  --  CO2 24 28 25 27   --   GLUCOSE 146* 123* 115* 136*  --   BUN 13 13 16 15   --   CREATININE 0.94 1.08 1.28* 1.08 1.09  CALCIUM 8.7* 8.3* 8.4* 8.2*  --    GFR: Estimated Creatinine Clearance: 82.2 mL/min (by C-G formula based on SCr of 1.09 mg/dL). Liver Function Tests: No results for input(s): AST, ALT, ALKPHOS, BILITOT, PROT, ALBUMIN in the last 168 hours. No results for input(s): LIPASE, AMYLASE in the last 168 hours. Recent Labs  Lab 04/16/18 0423  AMMONIA 62*   Coagulation Profile: No results for input(s): INR, PROTIME in the last 168 hours. Cardiac Enzymes: Recent Labs  Lab 04/15/18 1135  TROPONINI 0.03*   BNP (last 3  results) No results for input(s): PROBNP in the last 8760 hours. HbA1C: No results for input(s): HGBA1C in the last 72 hours. CBG: Recent Labs  Lab 04/18/18 1127 04/18/18 1623 04/18/18 2109 04/19/18 0718 04/19/18 1128  GLUCAP 234* 212* 152* 95 88   Lipid Profile: No results for input(s): CHOL, HDL, LDLCALC, TRIG, CHOLHDL, LDLDIRECT in the last 72 hours. Thyroid Function Tests: No results for input(s): TSH, T4TOTAL, FREET4, T3FREE, THYROIDAB in the last 72 hours. Anemia Panel: No results for input(s): VITAMINB12, FOLATE, FERRITIN, TIBC, IRON, RETICCTPCT in the last 72 hours. Sepsis Labs: No results for input(s): PROCALCITON, LATICACIDVEN in the last 168 hours.  No results found for this or any previous visit (from the past 240 hour(s)).       Radiology Studies: No results found.      Scheduled Meds: . amitriptyline  37.5 mg Oral QHS  . aspirin EC  81 mg Oral Daily  . clopidogrel  75 mg Oral Daily  . enoxaparin (LOVENOX) injection  40 mg Subcutaneous Q24H  . finasteride  5 mg Oral Daily  . folic acid  1 mg Oral Daily  . insulin aspart  0-15 Units Subcutaneous TID WC  . insulin aspart  0-5 Units Subcutaneous QHS  . insulin glargine  42 Units Subcutaneous BID  . lactulose  15 g Oral BID  . memantine  20 mg Oral QHS  . pantoprazole  40 mg Oral Daily  . potassium chloride  20 mEq Oral Daily  . pregabalin  300 mg Oral BID  . rifaximin  550 mg Oral BID  . simvastatin  10 mg Oral QPM  . sodium chloride flush  3 mL Intravenous Q12H  . sodium chloride flush  3 mL Intravenous Q12H  . tamsulosin  0.4 mg Oral QPC supper   Continuous Infusions: . sodium chloride    . sodium chloride    . sodium chloride       LOS: 4 days     Cordelia Poche, MD [facility] 04/19/2018, 12:30 PM  If 7PM-7AM, please contact night-coverage [email or web address]

## 2018-04-19 NOTE — Progress Notes (Signed)
MD Netty aware of pt walking saturations and orthostatic vital signs  No new orders at this time  Asked MD to call into the room, wife asking for update  MD stated he will speak to cardiology regarding discharge planning and provide family update   Pt wife upset regarding incorrect and missing tray  Wife states this has happened every day  RN ordered pt lunch tray  Pt resting comfortably in bed, provided pudding and apple juice for snack  Will continue to monitor

## 2018-04-19 NOTE — Progress Notes (Signed)
MD aware of pt CBG, stated okay to give lantus  Educated pt on hypoglycemia signs and symptoms  Will continue to monitor

## 2018-04-20 ENCOUNTER — Other Ambulatory Visit: Payer: Self-pay | Admitting: Physician Assistant

## 2018-04-20 ENCOUNTER — Encounter: Payer: Self-pay | Admitting: Physician Assistant

## 2018-04-20 ENCOUNTER — Encounter (HOSPITAL_COMMUNITY): Payer: Self-pay | Admitting: Emergency Medicine

## 2018-04-20 LAB — GLUCOSE, CAPILLARY
Glucose-Capillary: 59 mg/dL — ABNORMAL LOW (ref 70–99)
Glucose-Capillary: 69 mg/dL — ABNORMAL LOW (ref 70–99)
Glucose-Capillary: 115 mg/dL — ABNORMAL HIGH (ref 70–99)

## 2018-04-20 MED ORDER — PREDNISONE 50 MG PO TABS: 50.0000 mg | ORAL_TABLET | ORAL | 0 refills | Status: DC

## 2018-04-20 MED ORDER — INSULIN GLARGINE 100 UNIT/ML ~~LOC~~ SOLN: 35.0000 [IU] | Freq: Two times a day (BID) | SUBCUTANEOUS | Status: DC

## 2018-04-20 NOTE — Progress Notes (Signed)
  [facility] VALVE TEAM  Patient given the following instructions   You are scheduled for pre TAVR testing on 04/22/2018 at [facility] (Main Entrance A, Valet Parking). Please arrive at 7:15 am in Admitting for check in.   . Your CT scans (of chest/abdomen/pelvis/heart) will begin at 7:45am.  . You are scheduled for an ultrasound of your neck (carotid dopplers) at 9:00 am. . You are scheduled for pulmonary function tests (PFTs) at 10:00 am.  Pre procedure instructions for CT scan on 04/22/18. Please follow these instructions carefully (unless otherwise directed):  Hold all erectile dysfunction medications at least 48 hours prior to test.  On the Day/Night Before the Test: . Drink plenty of water. . Do not consume any caffeinated/decaffeinated beverages or chocolate 12 hours prior to your test. . Do not take any antihistamines 12 hours prior to your test. . If you take Metformin do not take 24 hours prior to test. . You have a contrast dye allergy. Prednisone will be called into your pharmacy with the following instructions: 1. Prednisone 50 mg - take 13 hours prior to test (6:45pm on 04/21/18) 2. Take another Prednisone 50 mg 7 hours prior to test (12:45am on 04/22/18) 3. Take another Prednisone 50 mg 1 hour prior to test (6:45am on 04/22/18) 4. Take Benadryl 50 mg 1 hour prior to test (6:45am on 04/22/18). Marland Kitchen You will need a ride after test due to Benadryl.  On the Day of the Test: . Drink plenty of water. Do not drink any water within one hour of the test. . Do not eat any food 4 hours prior to the test (do not eat after midnight the night before) . You may take your regular medications prior to the test. . HOLD Furosemide and spironolactone (fluid pills) on the morning of the test.  After the Test: . Drink plenty of water. . After receiving IV contrast, you may experience a mild flushed feeling. This is normal. . On occasion, you may  experience a mild rash up to 24 hours after the test. This is not dangerous. If this occurs, you can take Benadryl 25 mg and increase your fluid intake. . If you experience trouble breathing, this can be serious. If it is severe call 911 IMMEDIATELY. If it is mild, please call our office. . If you take any of these medications: Glipizide/Metformin, Avandament, Glucavance, please do not take 48 hours after completing test.  Follow-Up:  You have been referred to Dr Roxy Manns at [facility] for further TAVR evaluation. [place], [place], [place], [place] [address], [phone]  You are scheduled for first surgical evaluation with Dr Roxy Manns on 04/24/18 @1pm    . Please call the office on the day of your appointment to make sure the surgeon will be there and on time as many times there are delays or rescheduling due to emergency surgery.   Your physician has requested Outpatient Physical Therapy evaluation.  This will be done at [facility] at [address], [place] [address].   This is scheduled on 04/24/18 @ 11:45 am.   If you have any questions or concerns, please do not hesitate to call.  Sincerely,  Angelena Form PA-C [phone]

## 2018-04-20 NOTE — Progress Notes (Signed)
CM talked to patient with his spouse on speaker phone; patient is refusing all [facility] services at this time; spouse stated that he had it before and knows what to do; CM informed them that after discharge his PCP can make the arrangements from his office if he changed his mind; Aneta Mins [phone]

## 2018-04-21 ENCOUNTER — Telehealth (HOSPITAL_COMMUNITY): Payer: Self-pay | Admitting: Emergency Medicine

## 2018-04-21 NOTE — Telephone Encounter (Signed)
Reaching out to patient to offer assistance regarding upcoming cardiac imaging study; pt verbalizes understanding of appt date/time, parking situation and where to check in, pre-test NPO status and medications ordered, and verified current allergies; name and call back number provided for further questions should they arise Krimson Massmann RN Navigator Cardiac Imaging [phone] 

## 2018-04-22 ENCOUNTER — Ambulatory Visit (HOSPITAL_COMMUNITY)
Admission: RE | Admit: 2018-04-22 | Discharge: 2018-04-22 | Disposition: A | Discharge status: Home / Self Care | Payer: No Typology Code available for payment source | Source: Ambulatory Visit | Attending: Physician Assistant | Admitting: Physician Assistant

## 2018-04-22 ENCOUNTER — Ambulatory Visit (HOSPITAL_COMMUNITY)
Admit: 2018-04-22 | Discharge: 2018-04-22 | Disposition: A | Discharge status: Home / Self Care | Payer: No Typology Code available for payment source | Attending: Physician Assistant | Admitting: Physician Assistant

## 2018-04-22 ENCOUNTER — Ambulatory Visit (HOSPITAL_COMMUNITY): Admit: 2018-04-22 | Payer: No Typology Code available for payment source

## 2018-04-22 DIAGNOSIS — I350 Nonrheumatic aortic (valve) stenosis: Secondary | ICD-10-CM | POA: Insufficient documentation

## 2018-04-22 DIAGNOSIS — Z952 Presence of prosthetic heart valve: Secondary | ICD-10-CM | POA: Diagnosis not present

## 2018-04-22 LAB — PULMONARY FUNCTION TEST
DL/VA % pred: 115 %
DL/VA: 5 ml/min/mmHg/L
DLCO UNC % PRED: 60 %
DLCO cor % pred: 66 %
DLCO cor: 17.98 ml/min/mmHg
DLCO unc: 16.38 ml/min/mmHg
FEF 25-75 POST: 1.87 L/s
FEF 25-75 Pre: 1.93 L/sec
FEF2575-%Change-Post: -3 %
FEF2575-%Pred-Post: 100 %
FEF2575-%Pred-Pre: 103 %
FEV1-%CHANGE-POST: 2 %
FEV1-%Pred-Post: 69 %
FEV1-%Pred-Pre: 68 %
FEV1-Post: 1.8 L
FEV1-Pre: 1.76 L
FEV1FVC-%Change-Post: 1 %
FEV1FVC-%Pred-Pre: 111 %
FEV6-%CHANGE-POST: 1 %
FEV6-%PRED-POST: 65 %
FEV6-%Pred-Pre: 64 %
FEV6-Post: 2.18 L
FEV6-Pre: 2.15 L
FEV6FVC-%Change-Post: 0 %
FEV6FVC-%Pred-Post: 107 %
FEV6FVC-%Pred-Pre: 106 %
FVC-%Change-Post: 0 %
FVC-%Pred-Post: 60 %
FVC-%Pred-Pre: 60 %
FVC-Post: 2.18 L
FVC-Pre: 2.17 L
Post FEV1/FVC ratio: 82 %
Post FEV6/FVC ratio: 100 %
Pre FEV1/FVC ratio: 81 %
Pre FEV6/FVC Ratio: 99 %
RV % pred: 79 %
RV: 1.86 L
TLC % pred: 61 %
TLC: 3.86 L

## 2018-04-22 MED ORDER — METOPROLOL TARTRATE 5 MG/5ML IV SOLN
5.0000 mg | INTRAVENOUS | Status: DC | PRN
  Administered 2018-04-22 (×3): 5 mg via INTRAVENOUS
  Filled 2018-04-22 (×3): qty 5

## 2018-04-22 MED ORDER — ALBUTEROL SULFATE (2.5 MG/3ML) 0.083% IN NEBU
2.5000 mg | INHALATION_SOLUTION | Freq: Once | RESPIRATORY_TRACT | Status: AC
  Administered 2018-04-22: 2.5 mg via RESPIRATORY_TRACT

## 2018-04-22 MED ORDER — IOPAMIDOL (ISOVUE-370) INJECTION 76%
80.0000 mL | Freq: Once | INTRAVENOUS | Status: AC | PRN
  Administered 2018-04-22: 70 mL via INTRAVENOUS

## 2018-04-22 MED ORDER — METOPROLOL TARTRATE 5 MG/5ML IV SOLN
INTRAVENOUS | Status: AC
  Filled 2018-04-22: qty 15

## 2018-04-22 MED ORDER — IOPAMIDOL (ISOVUE-370) INJECTION 76%
50.0000 mL | Freq: Once | INTRAVENOUS | Status: AC | PRN
  Administered 2018-04-22: 50 mL via INTRAVENOUS

## 2018-04-22 NOTE — Progress Notes (Signed)
Patient informed Probation officer he took Benadryl prior to coming for exam. CT informed.

## 2018-04-22 NOTE — Progress Notes (Signed)
CT complete. Patient denies any complaints. Patient taken to respiratory for PFT test. Wife with patient.

## 2018-04-24 ENCOUNTER — Encounter: Payer: Medicare Other | Admitting: Thoracic Surgery (Cardiothoracic Vascular Surgery)

## 2018-04-24 ENCOUNTER — Telehealth: Payer: Self-pay | Admitting: Physician Assistant

## 2018-04-24 ENCOUNTER — Ambulatory Visit: Payer: Medicare Other | Admitting: Physical Therapy

## 2018-04-24 ENCOUNTER — Ambulatory Visit (HOSPITAL_COMMUNITY): Admission: RE | Admit: 2018-04-24 | Payer: Non-veteran care | Source: Ambulatory Visit

## 2018-04-24 NOTE — Telephone Encounter (Signed)
  [facility]   MULTIDISCIPLINARY HEART VALVE TEAM  Patient undergoing work up for TAVR. Pre TAVR CT came back with advanced cirrhosis with a 3.8 x 2.6 cm liver mass with "abdominal lymphadenopathy, omental caking and small volume of ascites which is concerning for peritoneal spread of malignancy."   He has known [place] treated with several failed embolization treatments at [facility]. There are extensive notes in Care Everywhere from GI and oncology. Looks like it previously measured 3.2 x 2.2 cm on MRI in October; however, I didn't see any mention about possible peritoneal spread.   I discussed case with Dr. Roxy Manns and Dr. Burt Knack. We decided to hold off TAVR evaluation until he is seen back by [facility].  Mr and Mrs. Reichart are aware of the plan. They have made [facility] aware of the findings through [place] and they are working on obtaining our CT images so that Dr. Gerald Dexter ([facility]) can review it personally. They will call us back once Dr. Gerald Dexter has reviewed scan.   Angelena Form PA-C  [facility]

## 2018-04-27 ENCOUNTER — Other Ambulatory Visit (HOSPITAL_COMMUNITY): Payer: Medicare Other

## 2018-05-11 ENCOUNTER — Ambulatory Visit (INDEPENDENT_AMBULATORY_CARE_PROVIDER_SITE_OTHER): Payer: Medicare Other | Admitting: Cardiology

## 2018-05-11 ENCOUNTER — Encounter: Payer: Self-pay | Admitting: Cardiology

## 2018-05-11 VITALS — BP 119/72 | HR 77 | Ht 70.0 in | Wt 320.6 lb

## 2018-05-11 DIAGNOSIS — I5042 Chronic combined systolic (congestive) and diastolic (congestive) heart failure: Secondary | ICD-10-CM | POA: Diagnosis not present

## 2018-05-11 DIAGNOSIS — Z7689 Persons encountering health services in other specified circumstances: Secondary | ICD-10-CM | POA: Diagnosis not present

## 2018-05-11 DIAGNOSIS — Z950 Presence of cardiac pacemaker: Secondary | ICD-10-CM

## 2018-05-11 MED ORDER — FUROSEMIDE 20 MG PO TABS: 20.0000 mg | ORAL_TABLET | ORAL | 3 refills | Status: DC

## 2018-05-11 MED ORDER — SPIRONOLACTONE 25 MG PO TABS: 25.0000 mg | ORAL_TABLET | ORAL | 3 refills | Status: DC

## 2018-05-11 NOTE — Progress Notes (Signed)
Subjective:    Patient ID: Keith [name], male    DOB: 1940/07/20, 78 y.o.   MRN: [id number]  HPI   Patient [age] a [age] Caucasian male with history of hepatocellular carcinoma and remote partial hepatectomy, cirrhosis of liver esophageal varices, recent diagnosis of severe aortic stenosis, low gradient low EF admitted to [facility]  on 04/14/2018 and discharged on 04/20/2018.  He was being evaluated for TAVR but then recent CT scan of the chest and abdomen revealed possible metastatic disease.  He is now referred back to [facility] weight he has been following for a long time.  Past medical history is also significant for morbid obesity, cirrhosis of liver due to [facility], obstructive sleep apnea, not on CPAP (non compliant), history of moderate aortic stenosis, high degree AV block S/P Medtronic dual-chamber pacemaker implantation on 06/25/2016 and pacemaker dependent, chronic diastolic heart failure with no significant coronary artery disease by catheterization in 2018, diabetes mellitus, hypertension, hyperlipidemia, history of TIA in 2007, history of Hepatocellular carcinoma S/P  Partial resection some time in 2007 follows [facility] (Dr Gordy Levan),  follicular lymphoma S/P chemotherapy 2009 and sees [facility] once every 6 months (Dr. Wadie Lessen), normally follows [facility], now admitted to the hospital with acute decompensated heart failure.  He is presently feeling well, states that his dyspnea is back to baseline, no dizziness or syncope.  His discontinued spironolactone and also furosemide since hospital discharge as he had developed renal insufficiency and is wondering whether he can start the medications back.   Review of Systems  Constitutional: Negative for unexpected weight change.  HENT: Negative for congestion.   Eyes: Negative for visual disturbance.  Respiratory: Positive for shortness of breath (chronic and stable).   Gastrointestinal: Negative  for abdominal pain, nausea and vomiting.  Endocrine: Negative for cold intolerance.  Genitourinary: Negative for dysuria.  Musculoskeletal: Positive for arthralgias (bilateral knee paij). Negative for myalgias.  Skin: Negative for rash.  Allergic/Immunologic: Negative for immunocompromised state.  Neurological: Negative for dizziness.  Hematological: Does not bruise/bleed easily.  Psychiatric/Behavioral: The patient is not nervous/anxious.   All other systems reviewed and are negative.      Objective:   Physical Exam Vitals signs and nursing note reviewed.  Constitutional:      General: He is not in acute distress.    Appearance: Normal appearance. He is obese. He is not toxic-appearing.  HENT:     Head: Normocephalic and atraumatic.     Nose: No congestion.     Mouth/Throat:     Mouth: Mucous membranes are dry.  Eyes:     Pupils: Pupils are equal, round, and reactive to light.  Neck:     Musculoskeletal: Neck supple. No muscular tenderness.     Vascular: Carotid bruit present.  Cardiovascular:     Pulses: Normal pulses.     Heart sounds: Distant heart sounds: S1 is normal, S2 is muffled. Murmur (3/6 crescendo decrescendo murmur in the aortic area.) present.  Abdominal:     General: Abdomen is flat and protuberant. Bowel sounds are normal. There is no distension.     Palpations: Abdomen is soft.     Tenderness: There is no abdominal tenderness.  Musculoskeletal:        General: No tenderness.     Right lower leg: 2+ Pitting Edema present.     Left lower leg: 2+ Pitting Edema present.  Lymphadenopathy:     Cervical: No cervical adenopathy.  Skin:  General: Skin is warm and dry.  Neurological:     General: No focal deficit present.     Mental Status: He is alert. He is disoriented.  Psychiatric:        Mood and Affect: Mood normal.   Blood pressure 119/72, pulse 77, height 5' 10"  (1.778 m), weight (!) 320 lb 9.6 oz (145.4 kg), SpO2 94 %.   Cardiac  Studies:  Right and left heart catheterization 04/17/2018: RA 13/12, mean 11 mmHg.; RV 50/12, EDP 5 mmHg.; PA 49/13, mean 35 mmHg, PA saturation 61%.; PW 28/39, mean 28 mmHg. Aortic saturation was 95%. CO 6.34, CI 2.53. QP/QS 1.00. Aortic peak to peak gradient 30 mmHg, mean 25.1 mmHg, valve area 1.10 cm.  Left main: Mildly calcified. No significant disease. LAD: Mild to moderately calcified. Ostial 50 to 60% stenosis. DFR 0.95, FFR 0.89, not hemodynamically significant. Mild diffuse disease throughout the LAD. Large D1. RI: Mild diffuse disease. CX: Mild calcification, no significant disease. RCA: Very large and ectatic, moderately calcified diffusely. Slow flow is evident. No high-grade stenosis is evident. Large PL and PDA branches.  Impression: No significant coronary artery disease, moderate disease in the ostial LAD. Moderate aortic stenosis. Moderate pulmonary hypertension with elevated moderate LVEDP.  TEE 04/16/2018: LVEF  35 to 40% % with global hypokinesis.  Trileaflet aortic valve with severe calcific degeneration.  Severe aortic stenosis, calculated aortic valve area 0.99 cm.  Planimetry valve area 0.7 cm.  Mild MR and TR.  Mild to moderate aortic atherosclerosis. Peak PG of 37 and mean PG is 21 mm Hg.  CTA chest and abdomen 04/22/18: 1. Vascular findings and measurements pertinent to potential TAVR procedure, as detailed above. 2. Severe thickening calcification of the aortic valve, compatible with the reported clinical history of severe aortic stenosis. 3. Morphologic changes in the liver indicative of advanced cirrhosis. Importantly, there is an indeterminate hypervascular lesion in the central aspect of the liver measuring approximately 3.8 x 2.6 cm. Further characterization of this lesion with nonemergent MRI of the abdomen with and without IV gadolinium is strongly recommended in the near future to exclude the possibility of hepatocellular  carcinoma. 4. In addition, there is upper abdominal lymphadenopathy, omental caking and small volume of ascites which is concerning for peritoneal spread of malignancy. 5. Small right pleural effusion lying dependently. 6. Aortic atherosclerosis, in addition to left main and 3 vesselcor onary artery disease. Assessment for potential risk factor modification, dietary therapy or pharmacologic therapy may be warranted, if clinically indicated. 7. Additional incidental findings, as above.  EKG 05/11/2018: AV paced rhythm, no further analysis.  BMP Latest Ref Rng & Units 04/17/2018 04/17/2018 04/16/2018  Glucose 70 - 99 mg/dL - 136(H) 115(H)  BUN 8 - 23 mg/dL - 15 16  Creatinine 0.61 - 1.24 mg/dL 1.09 1.08 1.28(H)  Sodium 135 - 145 mmol/L - 137 138  Potassium 3.5 - 5.1 mmol/L - 3.5 3.7  Chloride 98 - 111 mmol/L - 103 102  CO2 22 - 32 mmol/L - 27 25  Calcium 8.9 - 10.3 mg/dL - 8.2(L) 8.4(L)      Assessment & Plan:  1.  chronic systolic and diastolic heart failure 2.  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 3.  Second-degree AV block S/P Medtronic Advisa [id number] dual chamber pacemaker with Medtronic 5076 atrial lead and Medtronic 3830 HIS lead, all implanted 06/25/16. 4. Diabetes mellitus type 2 controlled without hyperglycemia, on insulin without complications. 5. Morbid  obesity and obstructive sleep apnea, not on CPAP.  Recommendation: Patient has severe aortic stenosis however he does have cirrhosis of liver and also prior hypertensive the carcinoma, not a candidate for transportation.  He is been evaluated by GI at [facility], I see a letter NP at [facility]. Gaynell Face stating that from cardiac standpoint we should proceed with procedure if deemed necessary and she'll continue to address hepatic issues.  There is increased risk of hepatic decompensation in the peri-operative time.  Today he is not in  any acute decompensated heart failure, he does have chronic diastolic heart failure and systolic heart failure.  Previously he could not tolerate carvedilol due to severe hypotension and they have tried this many times due to presence of varicose veins.  He was on furosemide and spironolactone 20 mg and 25 mg on every other day basis, advised him to restart the same but alternate spironolactone and furosemide.

## 2018-05-20 ENCOUNTER — Encounter: Payer: Self-pay | Admitting: Cardiology

## 2018-05-20 ENCOUNTER — Ambulatory Visit (INDEPENDENT_AMBULATORY_CARE_PROVIDER_SITE_OTHER): Payer: Medicare Other | Admitting: Cardiology

## 2018-05-20 VITALS — BP 119/72 | HR 85

## 2018-05-20 DIAGNOSIS — I5042 Chronic combined systolic (congestive) and diastolic (congestive) heart failure: Secondary | ICD-10-CM | POA: Diagnosis not present

## 2018-05-20 DIAGNOSIS — K7469 Other cirrhosis of liver: Secondary | ICD-10-CM

## 2018-05-20 DIAGNOSIS — I350 Nonrheumatic aortic (valve) stenosis: Secondary | ICD-10-CM

## 2018-05-20 MED ORDER — FUROSEMIDE 20 MG PO TABS: 40.0000 mg | ORAL_TABLET | ORAL | 3 refills | Status: DC

## 2018-05-20 NOTE — Progress Notes (Signed)
Subjective:  Primary Physician:  Haywood Pao, MD  Patient ID: Keith [name], male    DOB: 1940-06-20, 78 y.o.   MRN: [id number]  Chief Complaint  Patient presents with  . Breathing Problem  . Shortness of Breath    HPI: Keith [name]  is a 77 y.o. male  withextensive medical history including morbid obesity, cirrhosis of liver due to [facility] and also hepatocellular carcinoma S/O partial hepatectomy in 2007,  follows [facility] (Dr Gordy Levan),  follicular lymphoma S/P chemotherapy 2009 and sees [facility] once every 6 months, obstructive sleep apnea, not on CPAP (non compliant), history of high degree AV block S/P Medtronic dual-chamber pacemaker implantation on 06/25/2016 and pacemaker dependent, diabetes mellitus, hypertension, hyperlipidemia, history of TIA in 2007 and on chronic DAPT since, presents for acute visit for dyspnea.  He has moderate to severe AS and admitted to [facility] on 04/14/18 with acute decompensated CHF and found to have new non ischemic cardiomyopathy with EF 30% and felt to be due to AS. He was being evaluated for TAVR, CT revealed new lesion in the abdomen and with the suspicion for malignancy, the TAVR is postponed and awaiting [facility] evaluation.  His wife called our office to be seen as she noticed was the past 3 days he was having worsening dyspnea and also wheezing.  He was evaluated by the hospital in [place] and furosemide was increased from 20 mg every other day to 40 mg every day for 3 days and to follow-up with me.  He has noticed improvement in his wheezing but still has dyspnea.  No chest pain, no dizziness or syncope.  Past Medical History:  Diagnosis Date  . Cirrhosis of liver (HCC)    NASH  . Diabetes mellitus   . Hepatic cancer ([place])   . Prostate hypertrophy   . Shortness of breath   . Stroke ([place])   . Varicose veins   . Weakness of both legs     Past Surgical History:  Procedure Laterality Date  . APPENDECTOMY    . EYE  SURGERY     bilateral cataract   . INTRAVASCULAR PRESSURE WIRE/FFR STUDY N/A 04/17/2018   Procedure: INTRAVASCULAR PRESSURE WIRE/FFR STUDY;  Surgeon: Adrian Prows, MD;  Location: [facility];  Service: Cardiovascular;  Laterality: N/A;  . left arm surgery     age 58  . left hand surgery     tendons ripped on left hand  . LIVER RESECTION    . RIGHT/LEFT HEART CATH AND CORONARY ANGIOGRAPHY N/A 04/17/2018   Procedure: RIGHT/LEFT HEART CATH AND CORONARY ANGIOGRAPHY;  Surgeon: Adrian Prows, MD;  Location: [facility];  Service: Cardiovascular;  Laterality: N/A;  . TEE WITHOUT CARDIOVERSION N/A 04/16/2018   Procedure: TRANSESOPHAGEAL ECHOCARDIOGRAM (TEE);  Surgeon: Adrian Prows, MD;  Location: [facility];  Service: Cardiovascular;  Laterality: N/A;  . TONSILLECTOMY    . TOTAL KNEE ARTHROPLASTY Left 12/14/2012   Procedure: LEFT TOTAL KNEE ARTHROPLASTY;  Surgeon: Gearlean Alf, MD;  Location: [facility];  Service: Orthopedics;  Laterality: Left;    Social History   Socioeconomic History  . Marital status: Married    Spouse name: Not on file  . Number of children: Not on file  . Years of education: Not on file  . Highest education level: Not on file  Occupational History  . Occupation: retired  Scientific laboratory technician  . Financial resource strain: Not on file  . Food insecurity:  Worry: Not on file    Inability: Not on file  . Transportation needs:    Medical: Not on file    Non-medical: Not on file  Tobacco Use  . Smoking status: Former Smoker    Packs/day: 3.00    Years: 15.00    Pack years: 45.00    Types: Cigarettes    Last attempt to quit: 1970    Years since quitting: 50.1  . Smokeless tobacco: Never Used  Substance and Sexual Activity  . Alcohol use: No    Alcohol/week: 0.0 standard drinks  . Drug use: No  . Sexual activity: Not Currently  Lifestyle  . Physical activity:    Days per week: Not on file    Minutes per session: Not on file  . Stress: Not on file    Relationships  . Social connections:    Talks on phone: Not on file    Gets together: Not on file    Attends religious service: Not on file    Active member of club or organization: Not on file    Attends meetings of clubs or organizations: Not on file    Relationship status: Not on file  . Intimate partner violence:    Fear of current or ex partner: Not on file    Emotionally abused: Not on file    Physically abused: Not on file    Forced sexual activity: Not on file  Other Topics Concern  . Not on file  Social History Narrative  . Not on file    Current Outpatient Medications on File Prior to Visit  Medication Sig Dispense Refill  . albuterol (PROVENTIL HFA;VENTOLIN HFA) 108 (90 BASE) MCG/ACT inhaler Inhale 2 puffs into the lungs every 4 (four) hours as needed for wheezing or shortness of breath.     Marland Kitchen amitriptyline (ELAVIL) 25 MG tablet Take 25 mg by mouth at bedtime.     Marland Kitchen aspirin EC 81 MG EC tablet Take 1 tablet (81 mg total) by mouth daily. 30 tablet 0  . Cholecalciferol (VITAMIN D3) 50 MCG (2000 UT) TABS Take 2,000 Units by mouth daily.    . clopidogrel (PLAVIX) 75 MG tablet Take 75 mg by mouth daily.    . cyanocobalamin (,VITAMIN B-12,) 1000 MCG/ML injection Inject 1,000 mcg into the muscle every 30 (thirty) days.    . finasteride (PROSCAR) 5 MG tablet Take 5 mg by mouth every evening.     . folic acid (FOLVITE) 1 MG tablet Take 1 mg by mouth daily.    . hydrocerin (EUCERIN) CREA Apply 1 application topically daily as needed (dry skin).     . insulin aspart (NOVOLOG) 100 UNIT/ML injection Inject 5-30 Units into the skin See admin instructions. Inject 5-30 units into the skin three times a day before meals, per sliding scale    . insulin glargine (LANTUS) 100 UNIT/ML injection Inject 42 Units into the skin 2 (two) times daily. Inject 40 units into the skin 2 times a day- 10 AM and bedtime    . memantine (NAMENDA) 10 MG tablet Take 20 mg by mouth at bedtime.    . pantoprazole  (PROTONIX) 40 MG tablet Take 40 mg by mouth daily.    . polyvinyl alcohol (LIQUIFILM TEARS) 1.4 % ophthalmic solution Place 1 drop into both eyes 2 (two) times daily as needed (dry eyes).    . pregabalin (LYRICA) 300 MG capsule Take 300 mg by mouth 2 (two) times daily.    . rifaximin Doreene Nest)  550 MG TABS tablet Take 550 mg by mouth 2 (two) times daily.    . simvastatin (ZOCOR) 20 MG tablet Take 10 mg by mouth every evening.    Marland Kitchen spironolactone (ALDACTONE) 25 MG tablet Take 1 tablet (25 mg total) by mouth every other day. 90 tablet 3  . tamsulosin (FLOMAX) 0.4 MG CAPS Take 0.4 mg by mouth every evening.     . traMADol (ULTRAM) 50 MG tablet Take 1 tablet (50 mg total) by mouth every 12 (twelve) hours as needed for moderate pain. (Patient taking differently: Take 50 mg by mouth daily as needed for moderate pain. ) 30 tablet    No current facility-administered medications on file prior to visit.     Review of Systems  Constitutional: Positive for malaise/fatigue. Negative for weight loss.  Respiratory: Positive for shortness of breath. Negative for cough, hemoptysis and wheezing.   Cardiovascular: Positive for leg swelling. Negative for chest pain, palpitations and claudication.  Gastrointestinal: Negative for abdominal pain, blood in stool, constipation, heartburn and vomiting.  Genitourinary: Negative for dysuria.  Musculoskeletal: Positive for back pain and joint pain. Negative for myalgias.  Neurological: Positive for weakness (patient wheel chair bound). Negative for dizziness, focal weakness and headaches.  Endo/Heme/Allergies: Does not bruise/bleed easily.  Psychiatric/Behavioral: Negative for depression. The patient is not nervous/anxious.   All other systems reviewed and are negative.      Objective:  Blood pressure 119/72, pulse 85, SpO2 97 %.  Physical Exam  Constitutional: He appears well-developed. No distress.  Morbidly obese  HENT:  Head: Atraumatic.  Eyes: Conjunctivae  are normal.  Neck: No thyromegaly present.  Short neck and difficult to make out JVD  Cardiovascular: Normal rate, regular rhythm and intact distal pulses. Exam reveals distant heart sounds. Exam reveals no gallop.  Murmur heard.  Harsh midsystolic murmur is present with a grade of 3/6 at the upper right sternal border. Femoral and popliteal pulse difficult to feel due to patient's bodily habitus. Pulses:      Carotid pulses are 2+ on the right side and 2+ on the left side.      Radial pulses are 2+ on the right side and 2+ on the left side.       Dorsalis pedis pulses are 0 on the right side and 0 on the left side.       Posterior tibial pulses are 1+ on the right side and 1+ on the left side.  Pulmonary/Chest: Effort normal and breath sounds normal.  Abdominal: Soft. Bowel sounds are normal.  Large pannus and obese  Musculoskeletal: Normal range of motion.        General: Edema (2 plus pitting. Chronic dermitis change noted) present.  Neurological: He is alert.  Skin: Skin is warm and dry.  Psychiatric: He has a normal mood and affect.   CARDIAC STUDIES:  Right and left heart catheterization 04/17/2018: RA 13/12, mean 11 mmHg.; RV 50/12, EDP 5 mmHg.; PA 49/13, mean 35 mmHg, PA saturation 61%.; PW 28/39, mean 28 mmHg. Aortic saturation was 95%. CO 6.34, CI 2.53. QP/QS 1.00. Aortic peak to peak gradient 30 mmHg, mean 25.1 mmHg, valve area 1.10 cm.  Left main: Mildly calcified. No significant disease. LAD: Mild to moderately calcified. Ostial 50 to 60% stenosis. DFR 0.95, FFR 0.89, not hemodynamically significant. Mild diffuse disease throughout the LAD. Large D1. RI: Mild diffuse disease. CX: Mild calcification, no significant disease. RCA: Very large and ectatic, moderately calcified diffusely. Slow flow is evident. No high-grade  stenosis is evident. Large PL and PDA branches.  Impression: No significant coronary artery disease, moderate disease in the ostial LAD.  Moderate aortic stenosis. Moderate pulmonary hypertension with elevated moderate LVEDP.  Recommendation: Patient will be diuresed today, he will potentially be discharged home in the morning, TAVR team is evaluating the patient for possible TAVR.  TEE 04/16/2018: LVEF  35 to 40% % with global hypokinesis.  Trileaflet aortic valve with severe calcific degeneration.  Severe aortic stenosis, calculated aortic valve area 0.99 cm.  Planimetry valve area 0.7 cm.  Mild MR and TR.  Mild to moderate aortic atherosclerosis. Peak PG of 37 and mean PG is 21 mm Hg.  EKG 04/15/2018: Sinus rhythm with first-degree AV block, ventricularly paced rhythm. No further analysis.  BMP Latest Ref Rng & Units 04/17/2018 04/17/2018 04/16/2018  Glucose 70 - 99 mg/dL - 136(H) 115(H)  BUN 8 - 23 mg/dL - 15 16  Creatinine 0.61 - 1.24 mg/dL 1.09 1.08 1.28(H)  Sodium 135 - 145 mmol/L - 137 138  Potassium 3.5 - 5.1 mmol/L - 3.5 3.7  Chloride 98 - 111 mmol/L - 103 102  CO2 22 - 32 mmol/L - 27 25  Calcium 8.9 - 10.3 mg/dL - 8.2(L) 8.4(L)     Assessment & Recommendations:   1. Chronic combined systolic and diastolic CHF, NYHA class 1 (HCC) - furosemide (LASIX) 20 MG tablet; Take 2 tablets (40 mg total) by mouth every other day. Take 80 mg total for two days then back to 20 mg every other day and aldactone 25 mg every other day.  Dispense: 90 tablet; Refill: 3 Low BP and worsening renal function are a major issue to have guideline directed therapy.   2. Other cirrhosis of liver ([place]) And abnormal CT abdomen suspicious for malignancy and awaiting [facility] evaluation, has appointment to see them in 10 days.  3. Severe aortic stenosis Moderate to severe symptomatic AS and will probably be helped by TAVR to improve symptoms of dyspnea and CHF.  Patient has difficulty in taking beta blockers as this causes severe hypotension and unable to use guideline directed therapy due to low BP. Although hypertension mentioned in  history, his BP is very low and may be contributed by severe AS as well.   4. Obesity, Class III, BMI 40-49.9 (morbid obesity) ([place]) I have again extensively discussed with the patient regarding recurrence of CHF even with minimal dietary noncompliance.    He will keep his appointment with me in 2-3 weeks. He will call us if symptoms are getting worse and not improved over next few days.  Adrian Prows, MD, [facility] 05/20/2018, 10:05 PM [facility] [place] Pager: [phone] Office: [phone] If no answer Cell [phone]

## 2018-05-21 ENCOUNTER — Ambulatory Visit: Payer: Self-pay | Admitting: Cardiology

## 2018-05-22 ENCOUNTER — Telehealth: Payer: Self-pay

## 2018-05-22 NOTE — Telephone Encounter (Signed)
Pt's wife called and said that he had a good response to the Lasix, he did urinate a lot yesterday and his SOB/wheezinf is getting better. She did mention that his BP was low this morning at 100/70, she said you told her to call and you would let her know how to proceed.

## 2018-05-22 NOTE — Telephone Encounter (Signed)
Continue lasix as planned and do not change course

## 2018-05-26 ENCOUNTER — Encounter (HOSPITAL_COMMUNITY): Payer: Medicare Other

## 2018-05-27 DIAGNOSIS — K7581 Nonalcoholic steatohepatitis (NASH): Secondary | ICD-10-CM | POA: Diagnosis not present

## 2018-05-27 DIAGNOSIS — D649 Anemia, unspecified: Secondary | ICD-10-CM | POA: Diagnosis not present

## 2018-05-27 DIAGNOSIS — K766 Portal hypertension: Secondary | ICD-10-CM | POA: Diagnosis not present

## 2018-05-27 DIAGNOSIS — I8500 Esophageal varices without bleeding: Secondary | ICD-10-CM | POA: Diagnosis not present

## 2018-05-27 DIAGNOSIS — K7689 Other specified diseases of liver: Secondary | ICD-10-CM | POA: Diagnosis not present

## 2018-05-27 DIAGNOSIS — K7460 Unspecified cirrhosis of liver: Secondary | ICD-10-CM | POA: Diagnosis not present

## 2018-05-27 DIAGNOSIS — C220 Liver cell carcinoma: Secondary | ICD-10-CM | POA: Diagnosis not present

## 2018-05-27 DIAGNOSIS — I350 Nonrheumatic aortic (valve) stenosis: Secondary | ICD-10-CM | POA: Diagnosis not present

## 2018-05-27 DIAGNOSIS — C229 Malignant neoplasm of liver, not specified as primary or secondary: Secondary | ICD-10-CM | POA: Diagnosis not present

## 2018-05-27 DIAGNOSIS — D509 Iron deficiency anemia, unspecified: Secondary | ICD-10-CM | POA: Diagnosis not present

## 2018-05-27 DIAGNOSIS — J90 Pleural effusion, not elsewhere classified: Secondary | ICD-10-CM | POA: Diagnosis not present

## 2018-05-29 NOTE — Telephone Encounter (Signed)
Error

## 2018-06-01 ENCOUNTER — Other Ambulatory Visit: Payer: Self-pay | Admitting: Physician Assistant

## 2018-06-01 DIAGNOSIS — I350 Nonrheumatic aortic (valve) stenosis: Secondary | ICD-10-CM

## 2018-06-05 ENCOUNTER — Encounter: Payer: Self-pay | Admitting: Cardiology

## 2018-06-05 ENCOUNTER — Ambulatory Visit (INDEPENDENT_AMBULATORY_CARE_PROVIDER_SITE_OTHER): Payer: Medicare Other | Admitting: Cardiology

## 2018-06-05 ENCOUNTER — Ambulatory Visit: Payer: Medicare Other | Admitting: Cardiology

## 2018-06-05 VITALS — BP 103/65 | HR 95 | Ht 70.0 in | Wt 305.0 lb

## 2018-06-05 DIAGNOSIS — D696 Thrombocytopenia, unspecified: Secondary | ICD-10-CM

## 2018-06-05 DIAGNOSIS — I5043 Acute on chronic combined systolic (congestive) and diastolic (congestive) heart failure: Secondary | ICD-10-CM

## 2018-06-05 DIAGNOSIS — I8510 Secondary esophageal varices without bleeding: Secondary | ICD-10-CM | POA: Diagnosis not present

## 2018-06-05 DIAGNOSIS — R188 Other ascites: Secondary | ICD-10-CM

## 2018-06-05 DIAGNOSIS — I350 Nonrheumatic aortic (valve) stenosis: Secondary | ICD-10-CM

## 2018-06-05 DIAGNOSIS — I5042 Chronic combined systolic (congestive) and diastolic (congestive) heart failure: Secondary | ICD-10-CM

## 2018-06-05 DIAGNOSIS — K7460 Unspecified cirrhosis of liver: Secondary | ICD-10-CM | POA: Diagnosis not present

## 2018-06-05 MED ORDER — SPIRONOLACTONE 25 MG PO TABS: 50.0000 mg | ORAL_TABLET | Freq: Every day | ORAL | Status: DC

## 2018-06-05 NOTE — Progress Notes (Signed)
Subjective:  Primary Physician:  Haywood Pao, MD  Patient ID: Keith [name], male    DOB: 11-Feb-1941, 78 y.o.   MRN: [id number]  Chief Complaint  Patient presents with  . Congestive Heart Failure  . Aortic Stenosis    HPI: Keith [name]  is a 78 y.o. male  withextensive medical history including morbid obesity, cirrhosis of liver due to [facility] and also hepatocellular carcinoma S/O partial hepatectomy in 2007,  Esophageal varices, chronic thrombocytopenia follows [facility] (Dr Gordy Levan),  follicular lymphoma S/P chemotherapy 2009 and sees [facility] once every 6 months, obstructive sleep apnea, not on CPAP (non compliant), history of high degree AV block S/P Medtronic dual-chamber pacemaker implantation on 06/25/2016 and pacemaker dependent, diabetes mellitus, hypertension, hyperlipidemia, history of TIA in 2007 and on chronic DAPT since, presents for f/u of CHF.   He has moderate to severe AS and admitted to [facility] on 04/14/18 with acute decompensated CHF and found to have new non ischemic cardiomyopathy with EF 30% and felt to be due to AS.   He has noticed improvement in his wheezing but still has dyspnea.  No chest pain, no dizziness or syncope. He has lost about 20 pounds in weight since last office visit.  Leg edema is also improved.  He has had a visit with [facility] for evaluation of abnormal MRI suggestive of worsening or metastatic liver disease.  He has been cleared to proceed with TAVR.  He has made lifestyle changes with regard to his eating habits.  Past Medical History:  Diagnosis Date  . Cirrhosis of liver (HCC)    NASH  . Diabetes mellitus   . Hepatic cancer ([place])   . Prostate hypertrophy   . Shortness of breath   . Stroke ([place])   . Varicose veins   . Weakness of both legs     Past Surgical History:  Procedure Laterality Date  . APPENDECTOMY    . EYE SURGERY     bilateral cataract   . INTRAVASCULAR PRESSURE WIRE/FFR STUDY N/A 04/17/2018     Procedure: INTRAVASCULAR PRESSURE WIRE/FFR STUDY;  Surgeon: Adrian Prows, MD;  Location: [place] [facility];  Service: Cardiovascular;  Laterality: N/A;  . left arm surgery     age 89  . left hand surgery     tendons ripped on left hand  . LIVER RESECTION    . RIGHT/LEFT HEART CATH AND CORONARY ANGIOGRAPHY N/A 04/17/2018   Procedure: RIGHT/LEFT HEART CATH AND CORONARY ANGIOGRAPHY;  Surgeon: Adrian Prows, MD;  Location: [place] [facility];  Service: Cardiovascular;  Laterality: N/A;  . TEE WITHOUT CARDIOVERSION N/A 04/16/2018   Procedure: TRANSESOPHAGEAL ECHOCARDIOGRAM (TEE);  Surgeon: Adrian Prows, MD;  Location: [facility];  Service: Cardiovascular;  Laterality: N/A;  . TONSILLECTOMY    . TOTAL KNEE ARTHROPLASTY Left 12/14/2012   Procedure: LEFT TOTAL KNEE ARTHROPLASTY;  Surgeon: Gearlean Alf, MD;  Location[facility];  Service: Orthopedics;  Laterality: Left;    Social History   Socioeconomic History  . Marital status: Married    Spouse name: Not on file  . Number of children: Not on file  . Years of education: Not on file  . Highest education level: Not on file  Occupational History  . Occupation: retired  Scientific laboratory technician  . Financial resource strain: Not on file  . Food insecurity:    Worry: Not on file    Inability: Not on file  . Transportation needs:  Medical: Not on file    Non-medical: Not on file  Tobacco Use  . Smoking status: Former Smoker    Packs/day: 3.00    Years: 15.00    Pack years: 45.00    Types: Cigarettes    Last attempt to quit: 1970    Years since quitting: 50.1  . Smokeless tobacco: Never Used  Substance and Sexual Activity  . Alcohol use: No    Alcohol/week: 0.0 standard drinks  . Drug use: No  . Sexual activity: Not Currently  Lifestyle  . Physical activity:    Days per week: Not on file    Minutes per session: Not on file  . Stress: Not on file  Relationships  . Social connections:    Talks on phone: Not on file    Gets together: Not on file     Attends religious service: Not on file    Active member of club or organization: Not on file    Attends meetings of clubs or organizations: Not on file    Relationship status: Not on file  . Intimate partner violence:    Fear of current or ex partner: Not on file    Emotionally abused: Not on file    Physically abused: Not on file    Forced sexual activity: Not on file  Other Topics Concern  . Not on file  Social History Narrative  . Not on file    Current Outpatient Medications on File Prior to Visit  Medication Sig Dispense Refill  . albuterol (PROVENTIL HFA;VENTOLIN HFA) 108 (90 BASE) MCG/ACT inhaler Inhale 2 puffs into the lungs every 4 (four) hours as needed for wheezing or shortness of breath.     Marland Kitchen amitriptyline (ELAVIL) 25 MG tablet Take 25 mg by mouth at bedtime.     Marland Kitchen aspirin EC 81 MG EC tablet Take 1 tablet (81 mg total) by mouth daily. 30 tablet 0  . Cholecalciferol (VITAMIN D3) 50 MCG (2000 UT) TABS Take 2,000 Units by mouth daily.    . finasteride (PROSCAR) 5 MG tablet Take 5 mg by mouth every evening.     . folic acid (FOLVITE) 1 MG tablet Take 1 mg by mouth daily.    . furosemide (LASIX) 20 MG tablet Take 2 tablets (40 mg total) by mouth every other day. Take 80 mg total for two days then back to 20 mg every other day and aldactone 25 mg every other day. (Patient taking differently: Take 20 mg by mouth daily. Take 80 mg total for two days then back to 20 mg every other day and aldactone 25 mg every other day.) 90 tablet 3  . hydrocerin (EUCERIN) CREA Apply 1 application topically daily as needed (dry skin).     . insulin aspart (NOVOLOG) 100 UNIT/ML injection Inject 5-30 Units into the skin See admin instructions. Inject 5-30 units into the skin three times a day before meals, per sliding scale    . insulin glargine (LANTUS) 100 UNIT/ML injection Inject 42 Units into the skin 2 (two) times daily. Inject 40 units into the skin 2 times a day- 10 AM and bedtime    .  memantine (NAMENDA) 10 MG tablet Take 20 mg by mouth at bedtime.    . pantoprazole (PROTONIX) 40 MG tablet Take 40 mg by mouth daily.    . polyvinyl alcohol (LIQUIFILM TEARS) 1.4 % ophthalmic solution Place 1 drop into both eyes 2 (two) times daily as needed (dry eyes).    Marland Kitchen  pregabalin (LYRICA) 300 MG capsule Take 300 mg by mouth 2 (two) times daily.    . rifaximin (XIFAXAN) 550 MG TABS tablet Take 550 mg by mouth 2 (two) times daily.    . simvastatin (ZOCOR) 20 MG tablet Take 10 mg by mouth every evening.    Marland Kitchen spironolactone (ALDACTONE) 25 MG tablet Take 1 tablet (25 mg total) by mouth every other day. (Patient taking differently: Take 50 mg by mouth daily. ) 90 tablet 3  . tamsulosin (FLOMAX) 0.4 MG CAPS Take 0.4 mg by mouth every evening.     . traMADol (ULTRAM) 50 MG tablet Take 1 tablet (50 mg total) by mouth every 12 (twelve) hours as needed for moderate pain. (Patient taking differently: Take 50 mg by mouth daily as needed for moderate pain. ) 30 tablet   . cyanocobalamin (,VITAMIN B-12,) 1000 MCG/ML injection Inject 1,000 mcg into the muscle every 30 (thirty) days.     No current facility-administered medications on file prior to visit.     Review of Systems  Constitutional: Positive for malaise/fatigue. Negative for weight loss.  Respiratory: Positive for shortness of breath. Negative for cough, hemoptysis and wheezing.   Cardiovascular: Positive for leg swelling. Negative for chest pain, palpitations and claudication.  Gastrointestinal: Negative for abdominal pain, blood in stool, constipation, heartburn and vomiting.  Genitourinary: Negative for dysuria.  Musculoskeletal: Positive for back pain and joint pain. Negative for myalgias.  Neurological: Positive for weakness (patient wheel chair bound). Negative for dizziness, focal weakness and headaches.  Endo/Heme/Allergies: Does not bruise/bleed easily.  Psychiatric/Behavioral: Negative for depression. The patient is not  nervous/anxious.   All other systems reviewed and are negative.     Objective:  Blood pressure 103/65, pulse 95, height 5' 10"  (1.778 m), weight (!) 305 lb (138.3 kg), SpO2 94 %.  Physical Exam  Constitutional: He appears well-developed. No distress.  Morbidly obese  HENT:  Head: Atraumatic.  Eyes: Conjunctivae are normal.  Neck: No thyromegaly present.  Short neck and difficult to make out JVD  Cardiovascular: Normal rate, regular rhythm and intact distal pulses. Exam reveals distant heart sounds. Exam reveals no gallop.  Murmur heard.  Harsh midsystolic murmur is present with a grade of 3/6 at the upper right sternal border. Femoral and popliteal pulse difficult to feel due to patient's bodily habitus. Pulses:      Carotid pulses are 2+ on the right side and 2+ on the left side.      Radial pulses are 2+ on the right side and 2+ on the left side.       Dorsalis pedis pulses are 0 on the right side and 0 on the left side.       Posterior tibial pulses are 1+ on the right side and 1+ on the left side.  Pulmonary/Chest: Effort normal and breath sounds normal.  Abdominal: Soft. Bowel sounds are normal.  Large pannus and obese  Musculoskeletal: Normal range of motion.        General: Edema (2 plus pitting. Chronic dermitis change noted) present.  Neurological: He is alert.  Skin: Skin is warm and dry.  Psychiatric: He has a normal mood and affect.   CARDIAC STUDIES:  Right and left heart catheterization 04/17/2018: RA 13/12, mean 11 mmHg.; RV 50/12, EDP 5 mmHg.; PA 49/13, mean 35 mmHg, PA saturation 61%.; PW 28/39, mean 28 mmHg. Aortic saturation was 95%. CO 6.34, CI 2.53. QP/QS 1.00. Aortic peak to peak gradient 30 mmHg, mean 25.1 mmHg, valve  area 1.10 cm.  Left main: Mildly calcified. No significant disease. LAD: Mild to moderately calcified. Ostial 50 to 60% stenosis. DFR 0.95, FFR 0.89, not hemodynamically significant. Mild diffuse disease throughout the LAD. Large  D1. RI: Mild diffuse disease. CX: Mild calcification, no significant disease. RCA: Very large and ectatic, moderately calcified diffusely. Slow flow is evident. No high-grade stenosis is evident. Large PL and PDA branches.  Impression: No significant coronary artery disease, moderate disease in the ostial LAD. Moderate aortic stenosis. Moderate pulmonary hypertension with elevated moderate LVEDP.  Recommendation: Patient will be diuresed today, he will potentially be discharged home in the morning, TAVR team is evaluating the patient for possible TAVR.  TEE 04/16/2018: LVEF  35 to 40% % with global hypokinesis.  Trileaflet aortic valve with severe calcific degeneration.  Severe aortic stenosis, calculated aortic valve area 0.99 cm.  Planimetry valve area 0.7 cm.  Mild MR and TR.  Mild to moderate aortic atherosclerosis. Peak PG of 37 and mean PG is 21 mm Hg.  EKG 04/15/2018: Sinus rhythm with first-degree AV block, ventricularly paced rhythm. No further analysis.  BMP Latest Ref Rng & Units 04/17/2018 04/17/2018 04/16/2018  Glucose 70 - 99 mg/dL - 136(H) 115(H)  BUN 8 - 23 mg/dL - 15 16  Creatinine 0.61 - 1.24 mg/dL 1.09 1.08 1.28(H)  Sodium 135 - 145 mmol/L - 137 138  Potassium 3.5 - 5.1 mmol/L - 3.5 3.7  Chloride 98 - 111 mmol/L - 103 102  CO2 22 - 32 mmol/L - 27 25  Calcium 8.9 - 10.3 mg/dL - 8.2(L) 8.4(L)     Assessment & Recommendations:   1. Acute on Chronic combined systolic and diastolic CHF, NYHA class III  2. Other cirrhosis of liver (HCC) with ascites and varices, non bleeding And abnormal CT abdomen suspicious for malignancy and awaiting [facility] evaluation, has appointment to see them in 10 days.  3. Severe aortic stenosis Moderate to severe symptomatic AS and will probably be helped by TAVR to improve symptoms of dyspnea and CHF.   4. Obesity, Class III, BMI 40-49.9 (morbid obesity) ([place])  5. Chronic thrombus at opinion related to hepatic  failure.  Recommendations:  I reviewed the results of his labs from the [facility], also reviewed the the notes from [place].  Fortunately his acute decompensated heart failure has improved.  He has lost about 20 pounds in weight with diuresis.  His Lasix is very effective in diuresis, he is only on minimal doses due to chronic low blood pressure.  Also spironolactone dose was increased by the GI due to presence of ascites by MRI and also bilateral pleural effusion related to heart failure. Low BP and worsening renal function are a major issue to have guideline directed therapy.   Although he has been cleared by [facility] for proceeding with TAVR, he is also been recommended upper GI endoscopy to evaluate for analysis.  Patient is presently off of Plavix, only on aspirin.  Advised him to hold off on Plavix for now.  I would strongly recommend proceeding with GI procedure 1st as during TAVR, in view of chronic thrombus.,  He would have high risk of bleeding with anticoagulation and dual antiplatelet therapy.  Hopefully if significant esophageal varices are present, he can be taken care of this prior to proceeding with TAVR.  I'll come indicated this with TAVR team.  He also has severe iron deficiency anemia.  In view of heart failure, iron absorption by  oral iron may be inadequate.  We can consider IV iron infusion. Overall I'm very pleased that he has been compliant with making lifestyle changes specifically with regard to his diet and has lost about 20 pounds in weight.  I'll see him back in one month or sooner if problems. Patient's wife will contact [facility] Team to discuss GI options.   Adrian Prows, MD, [facility] 06/05/2018, 11:16 AM [facility]. [place] Pager: [phone] Office: [phone] If no answer Cell [phone]

## 2018-06-10 ENCOUNTER — Other Ambulatory Visit: Payer: Self-pay

## 2018-06-10 ENCOUNTER — Institutional Professional Consult (permissible substitution) (INDEPENDENT_AMBULATORY_CARE_PROVIDER_SITE_OTHER): Payer: No Typology Code available for payment source | Admitting: Thoracic Surgery (Cardiothoracic Vascular Surgery)

## 2018-06-10 ENCOUNTER — Encounter: Payer: Self-pay | Admitting: Thoracic Surgery (Cardiothoracic Vascular Surgery)

## 2018-06-10 ENCOUNTER — Ambulatory Visit (HOSPITAL_COMMUNITY)
Admission: RE | Admit: 2018-06-10 | Discharge: 2018-06-10 | Disposition: A | Discharge status: Home / Self Care | Payer: No Typology Code available for payment source | Source: Ambulatory Visit | Attending: Physician Assistant | Admitting: Physician Assistant

## 2018-06-10 ENCOUNTER — Ambulatory Visit: Payer: Medicare Other | Attending: Physician Assistant | Admitting: Physical Therapy

## 2018-06-10 VITALS — BP 100/59 | HR 61 | Resp 20 | Ht 70.0 in | Wt 305.0 lb

## 2018-06-10 DIAGNOSIS — I350 Nonrheumatic aortic (valve) stenosis: Secondary | ICD-10-CM | POA: Insufficient documentation

## 2018-06-10 DIAGNOSIS — I2510 Atherosclerotic heart disease of native coronary artery without angina pectoris: Secondary | ICD-10-CM | POA: Diagnosis not present

## 2018-06-10 DIAGNOSIS — R2689 Other abnormalities of gait and mobility: Secondary | ICD-10-CM | POA: Diagnosis not present

## 2018-06-10 NOTE — Therapy (Addendum)
[place], [place], [address] Phone: [phone]   Fax:  [phone]  Physical Therapy Evaluation   Patient Details  Name: Keith [name] MRN: [id number] Date of Birth: [birth year] Referring Provider (PT): Eileen Stanford, [place]   Encounter Date: 06/10/2018  PT End of Session - 06/10/18 1428    Visit Number  1    Number of Visits  1    Date for PT Re-Evaluation  06/10/18    PT Start Time  1331    PT Stop Time  1416    PT Time Calculation (min)  45 min    Activity Tolerance  Patient tolerated treatment well    Behavior During Therapy  [facility] for tasks assessed/performed       Past Medical History:  Diagnosis Date  . Cirrhosis of liver (HCC)    NASH  . Diabetes mellitus   . Hepatic cancer ([place])   . Prostate hypertrophy   . Shortness of breath   . Stroke ([place])   . Varicose veins   . Weakness of both legs     Past Surgical History:  Procedure Laterality Date  . APPENDECTOMY    . EYE SURGERY     bilateral cataract   . INTRAVASCULAR PRESSURE WIRE/FFR STUDY N/A 04/17/2018   Procedure: INTRAVASCULAR PRESSURE WIRE/FFR STUDY;  Surgeon: Adrian Prows, MD;  Location: [place] [facility];  Service: Cardiovascular;  Laterality: N/A;  . left arm surgery     age 49  . left hand surgery     tendons ripped on left hand  . LIVER RESECTION    . RIGHT/LEFT HEART CATH AND CORONARY ANGIOGRAPHY N/A 04/17/2018   Procedure: RIGHT/LEFT HEART CATH AND CORONARY ANGIOGRAPHY;  Surgeon: Adrian Prows, MD;  Location: [place] [facility];  Service: Cardiovascular;  Laterality: N/A;  . TEE WITHOUT CARDIOVERSION N/A 04/16/2018   Procedure: TRANSESOPHAGEAL ECHOCARDIOGRAM (TEE);  Surgeon: Adrian Prows, MD;  Location: [facility];  Service: Cardiovascular;  Laterality: N/A;  . TONSILLECTOMY    . TOTAL KNEE ARTHROPLASTY Left 12/14/2012   Procedure: LEFT TOTAL KNEE ARTHROPLASTY;  Surgeon: Gearlean Alf, MD;  Location: [facility];  Service:  Orthopedics;  Laterality: Left;    There were no vitals filed for this visit.   Subjective Assessment - 06/10/18 1337    Subjective  pt reports CC of dizziness and SOB with activity that has been going on for a few months and has progressively worsening. pt reports having ankle pain that is about a 6/10 that has been going on for a long time    Patient Stated Goals  to get heart better    Currently in Pain?  Yes    Pain Score  6     Pain Location  Ankle    Pain Orientation  Right    Pain Descriptors / Indicators  Aching;Sore    Pain Type  Chronic pain    Aggravating Factors   standing/ walking    Pain Relieving Factors  medciation          [facility] PT Assessment - 06/10/18 1339      Assessment   Medical Diagnosis  Severe Aortic Stenosis    Referring Provider (PT)  Eileen Stanford, PA-C    Onset Date/Surgical Date  --   few months ago   Hand Dominance  Right      Precautions   Precautions  ICD/Pacemaker      Restrictions   Weight Bearing Restrictions  No      Balance Screen   Has the patient fallen in the past 6 months  No    Has the patient had a decrease in activity level because of a fear of falling?   No    Is the patient reluctant to leave their home because of a fear of falling?   No      Home Film/video editor residence    Living Arrangements  Spouse/significant other    Available Help at Discharge  Family    Type of [place] to enter    Entrance Stairs-Number of Steps  1    Entrance Stairs-Rails  Can reach both    [place]  One level    [place] - 2 wheels;Cane - single point;Tub bench;Shower seat;Bedside commode;Wheelchair - manual      Prior Function   Level of Independence  Requires assistive device for independence   Rw and W/C at home   Vocation  Retired      [employer]   Overall Cognitive Status  Within Functional Limits for tasks assessed      AROM   Overall AROM   Within  functional limits for tasks performed      Strength   Overall Strength Comments  mild weakness in the R ankle 4-/5     Right Hand Grip (lbs)  63    Left Hand Grip (lbs)  54      Ambulation/Gait   Ambulation/Gait  Yes    Assistive device  Rolling walker    Gait Pattern  Step-to pattern;Decreased stride length    Gait Comments  pt used WC to get into the clinic and utilized clinic RW for testing       [facility] Pre-Surgical Assessment - 06/10/18 0001    5 Meter Walk Test- trial 1  13 sec    5 Meter Walk Test- trial 2  13 sec.     5 Meter Walk Test- trial 3  11 sec.   heart rate went to 181 requiring rest before 6 min walk   5 meter walk test average  12.33 sec    4 Stage Balance Test tolerated for:   2 sec.    4 Stage Balance Test Position  2    Sit To Stand Test- trial 1  --   unable to perform   ADL/IADL Independent with:  Bathing;Dressing    ADL/IADL Needs Assistance with:  Meal prep;Finances;Yard work    ADL/IADL Therapist, sports Index  Vulnerable    6 Minute Walk- Baseline  yes    BP (mmHg)  108/72    HR (bpm)  82    02 Sat (%RA)  95 %    Modified Borg Scale for Dyspnea  4- somewhat severe    Perceived Rate of Exertion (Borg)  13- Somewhat hard    6 Minute Walk Post Test  yes    BP (mmHg)  (!) 135/94    HR (bpm)  158    02 Sat (%RA)  88 %    Modified Borg Scale for Dyspnea  5- Strong or hard breathing    Perceived Rate of Exertion (Borg)  13- Somewhat hard    Aerobic Endurance Distance Walked  345   stopped due to pt's heart rate reaching max @ 5:24    Endurance additional comments  pt demonstrates 80.05% limitation compared to age related norm  required use of W/C to get into clinic and RW for testing             Objective measurements completed on examination: See above findings.              PT Education - 06/10/18 1427    Education Details  proper form getting out of chair using hands to push up from the chair.     Person(s) Educated  Patient     Methods  Explanation;Verbal cues    Comprehension  Verbalized understanding;Verbal cues required       PT Short Term Goals - 07/09/17 1150      PT SHORT TERM GOAL #1   Title  patient to be independent with initial HEP     Status  Achieved        PT Long Term Goals - 11/13/17 1254      PT LONG TERM GOAL #2   Title  Patient to report ability to walk 350 feet without rest or increase in low back pain     Status  On-going             Plan - 06/10/18 1429    Clinical Impression Statement  see assessment in note    Clinical Decision Making  Low    PT Frequency  One time visit    PT Next Visit Plan  Pre-TAVR evaluation    Consulted and Agree with Plan of Care  Patient;Family member/caregiver    Family Member Consulted  wife      Clinical Impression Statement: Pt is a 78 yo M presenting to OP PT for evaluation prior to possible TAVR surgery due to severe aortic stenosis. Pt reports onset of SOB and dizziness approximately 2-3 months ago. Symptoms are limiting endurance, standing/ walking. Pt presents with functional ROM and strength except for mild weakness noted in the R ankle, poor balance and is assess as at high at high fall risk 4 stage balance test, limited walking speed and limited aerobic endurance per 6 minute walk test. Pt ambulated 345 feet in 5:24 before mandatory rest break due to heart rate reaching max at 158. At end of test patient's HR was 158 bpm and O2 was 88 on room air. Pt reported 5/10 shortness of breath on modified scale for dyspnea.  Pt ambulated a total of 345 feet in 6 minute walk. SOB, Heart rate and dizziniess increased significantly with 6 minute walk test. Based on the Short Physical Performance Battery, patient has a frailty rating of 3/12 with </= 5/12 considered frail.    Patient demonstrated the following deficits and impairments:     Visit Diagnosis: Other abnormalities of gait and mobility     Problem List Patient Active Problem List    Diagnosis Date Noted  . Chronic combined systolic and diastolic CHF, NYHA class 1 ([place]) 05/11/2018  . Severe aortic stenosis 04/15/2018  . AV block 04/15/2018  . Acute respiratory distress 04/15/2018  . Chest pressure 04/15/2018  . Acute combined systolic and diastolic (congestive) hrt fail ([place]) 04/14/2018  . Obesity, Class III, BMI 40-49.9 (morbid obesity) ([place]) 04/14/2018  . Diabetes mellitus type 2 in obese ([place])   . Orthostatic hypotension   . Syncope 12/17/2017  . Postural dizziness with near syncope 12/17/2017  . Near syncope 12/17/2017  . Increased ammonia level 11/30/2017  . Hepatic cancer ([place]) 11/30/2017  . Dementia ([place]) 11/30/2017  . BPH (benign prostatic hyperplasia) 11/30/2017  . Coronary artery disease 11/30/2017  .  Weakness 11/23/2017  . Cirrhosis of liver ([place]) 11/23/2017  . OSA (obstructive sleep apnea) 11/23/2017  . Varicose veins of left lower extremity with complications 41/75/3010  . Knee pain, acute 01/26/2013  . Neuropathy 12/22/2012  . Prostate hypertrophy 12/22/2012  . Hyperlipidemia 12/22/2012  . GERD (gastroesophageal reflux disease) 12/17/2012  . Hypertension 12/17/2012  . Thrombocytopenia, unspecified ([place]) 12/17/2012  . Hyponatremia 12/15/2012  . OA (osteoarthritis) of knee 12/14/2012    [place] 06/10/2018, 2:30 PM  [facility] [address] [place], [place], [address] Phone: [phone]   Fax:  [phone]  Name: Keith [name] MRN: [id number] Date of Birth: [birth year]

## 2018-06-10 NOTE — Progress Notes (Signed)
HEART AND [place] SURGERY CONSULTATION REPORT  Referring Provider is Adrian Prows, MD  Primary Gastroenterologist is Doristine Devoid, MD [facility]) PCP is Tisovec, Fransico Him, MD  Chief Complaint  Patient presents with  . Aortic Stenosis    Surgical eval for TAVR, review all studies    HPI:  Patient is a 78 year old morbidly obese male with history of paradoxical low flow, low gradient aortic stenosis, chronic combined systolic and diastolic congestive heart failure with recent acute exacerbation, heart failure status post permanent pacemaker placement in 2018, NASH cirrhosis complicated by portal hypertension, splenomegaly, history of upper abdominal and esophageal varices, hepatic encephalopathy, fluid retention, and hepatocellular carcinoma, type 2 diabetes mellitus with complications, severe degenerative arthritis with very limited physical mobility, obstructive sleep apnea, and remote history of follicular lymphoma who has been referred for surgical consultation to discuss treatment options for management of severe symptomatic aortic stenosis.  Patient states that he was told he has had a heart murmur since childhood and he reportedly was hospitalized during childhood for what may have been rheumatic fever.  In 2017 he was evaluated at [facility] for presence of a heart murmur and sinus bradycardia.  At that time he was found to have normal left ventricular systolic function with mild aortic stenosis.  He later developed intermittent second-degree AV block and underwent Medtronic dual-chamber pacemaker placement in 2018.  Follow-up echocardiogram in June 2018 revealed some progression of the patient's aortic valve disease with mean transvalvular gradient estimated 19 mmHg and aortic valve area calculated 1.1 cm.  Diagnostic cardiac catheterization performed September 2018 revealed normal coronary arteries and  paradoxical low flow, low gradient moderate to severe aortic stenosis with aortic valve area calculated 1.2 cm and mean transvalvular gradient measured 22 mmHg.  Watchful waiting was recommended.  Patient states that 4 to 6 months ago he began to develop worsening shortness of breath.  He was hospitalized at [facility] early January of this year with resting shortness of breath and volume overload with bilateral pleural effusions.  Transthoracic echocardiogram revealed moderate left ventricular systolic dysfunction with ejection fraction estimated 45 to 50%. Peak velocity across the aortic valve measured 3.1 m/s corresponding to mean transvalvular gradient estimated 22 mmHg.  The DVI was reported 0.3 with aortic valve area calculated 1.2 cm.  He subsequently underwent transesophageal echocardiogram.  The aortic valve was trileaflet and felt to be severely calcified.  There was felt to be low gradient severe aortic stenosis with valve area by continuity equation estimated 1.02 cm and planimetry 0.7 cm.  The patient was referred to the multidisciplinary heart valve team and evaluated by Dr. Burt Knack.  Diagnostic cardiac catheterization was subsequently performed by Dr. Einar Gip.  At catheterization the mean transvalvular gradient across the aortic valve was measured 25.1 mmHg corresponding to aortic valve area calculated 1.1 cm.  Pulmonary artery pressures were mild to moderately elevated.  There was felt to be "no significant coronary artery disease, moderate disease in the ostial LAD" which was confirmed using flow wire analysis.  CT angiography was performed.  Incidental findings were notable for some slight interval increase size of the mass in the patient's liver consistent with hepatocellular carcinoma.  Since hospital discharge the patient has been seen in follow-up by his gastroenterologist at [facility] who felt there are no contraindications to proceeding with  transcatheter aortic valve replacement.  However, upper GI endoscopy has been scheduled to rule out  significant esophageal varices.  Cardiothoracic surgical consultation was requested.  Patient is married and lives locally in [place] with his wife.  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  He is severely limited physically because of degenerative arthritis and peripheral neuropathy.  He spends most of his time in his wheelchair.  He can walk very short distances using a walker but he is very unsteady on his feet.  He has had multiple amputations of toes on both feet.  He has had problems with hepatic encephalopathy in the past although not within the past 6 months.  He describes chronic severe exertional shortness of breath with low-level activity.  He occasionally gets short of breath at rest.  He has chronic lower extremity edema which has been improved recently.  He has never had any chest pain or chest tightness either with activity or at rest.  He has had some dizzy spells.  He had 1 syncopal episode more than 6 months ago that was attributed to hepatic encephalopathy.  Past Medical History:  Diagnosis Date  . Cirrhosis of liver (HCC)    NASH  . Diabetes mellitus   . Hepatic cancer ([place])   . Prostate hypertrophy   . Shortness of breath   . Stroke ([place])   . Varicose veins   . Weakness of both legs     Past Surgical History:  Procedure Laterality Date  . APPENDECTOMY    . EYE SURGERY     bilateral cataract   . INTRAVASCULAR PRESSURE WIRE/FFR STUDY N/A 04/17/2018   Procedure: INTRAVASCULAR PRESSURE WIRE/FFR STUDY;  Surgeon: Adrian Prows, MD;  Location: [place] [facility];  Service: Cardiovascular;  Laterality: N/A;  . left arm surgery     age 57  . left hand surgery     tendons ripped on left hand  . LIVER RESECTION    . RIGHT/LEFT HEART CATH AND CORONARY ANGIOGRAPHY N/A 04/17/2018   Procedure: RIGHT/LEFT  HEART CATH AND CORONARY ANGIOGRAPHY;  Surgeon: Adrian Prows, MD;  Location: [place] [facility];  Service: Cardiovascular;  Laterality: N/A;  . TEE WITHOUT CARDIOVERSION N/A 04/16/2018   Procedure: TRANSESOPHAGEAL ECHOCARDIOGRAM (TEE);  Surgeon: Adrian Prows, MD;  Location: [place];  Service: Cardiovascular;  Laterality: N/A;  . TONSILLECTOMY    . TOTAL KNEE ARTHROPLASTY Left 12/14/2012   Procedure: LEFT TOTAL KNEE ARTHROPLASTY;  Surgeon: Gearlean Alf, MD;  Location: [facility];  Service: Orthopedics;  Laterality: Left;    Family History  Problem Relation Age of Onset  . Stroke Father   . Heart failure Brother 56       Identical twin  . Aortic stenosis Mother     Social History   Socioeconomic History  . Marital status: Married    Spouse name: Not on file  . Number of children: Not on file  . Years of education: Not on file  . Highest education level: Not on file  Occupational History  . Occupation: retired  Scientific laboratory technician  . Financial resource strain: Not on file  . Food insecurity:    Worry: Not on file    Inability: Not on file  . Transportation needs:    Medical: Not on file    Non-medical: Not on file  Tobacco Use  . Smoking status: Former Smoker    Packs/day: 3.00    Years: 15.00    Pack years: 45.00    Types: Cigarettes  Last attempt to quit: 1970    Years since quitting: 50.2  . Smokeless tobacco: Never Used  Substance and Sexual Activity  . Alcohol use: No    Alcohol/week: 0.0 standard drinks  . Drug use: No  . Sexual activity: Not Currently  Lifestyle  . Physical activity:    Days per week: Not on file    Minutes per session: Not on file  . Stress: Not on file  Relationships  . Social connections:    Talks on phone: Not on file    Gets together: Not on file    Attends religious service: Not on file    Active member of club or organization: Not on file    Attends meetings of clubs or organizations: Not on file    Relationship status: Not on file  .  Intimate partner violence:    Fear of current or ex partner: Not on file    Emotionally abused: Not on file    Physically abused: Not on file    Forced sexual activity: Not on file  Other Topics Concern  . Not on file  Social History Narrative  . Not on file    Current Outpatient Medications  Medication Sig Dispense Refill  . albuterol (PROVENTIL HFA;VENTOLIN HFA) 108 (90 BASE) MCG/ACT inhaler Inhale 2 puffs into the lungs every 4 (four) hours as needed for wheezing or shortness of breath.     Marland Kitchen amitriptyline (ELAVIL) 25 MG tablet Take 25 mg by mouth at bedtime.     Marland Kitchen aspirin EC 81 MG EC tablet Take 1 tablet (81 mg total) by mouth daily. 30 tablet 0  . Cholecalciferol (VITAMIN D3) 50 MCG (2000 UT) TABS Take 2,000 Units by mouth daily.    . cyanocobalamin (,VITAMIN B-12,) 1000 MCG/ML injection Inject 1,000 mcg into the muscle every 30 (thirty) days.    . finasteride (PROSCAR) 5 MG tablet Take 5 mg by mouth every evening.     . folic acid (FOLVITE) 1 MG tablet Take 1 mg by mouth daily.    . furosemide (LASIX) 20 MG tablet Take 2 tablets (40 mg total) by mouth every other day. Take 80 mg total for two days then back to 20 mg every other day and aldactone 25 mg every other day. (Patient taking differently: Take 20 mg by mouth daily. Marland Kitchen) 90 tablet 3  . hydrocerin (EUCERIN) CREA Apply 1 application topically daily as needed (dry skin).     . insulin aspart (NOVOLOG) 100 UNIT/ML injection Inject 5-30 Units into the skin See admin instructions. Inject 5-30 units into the skin three times a day before meals, per sliding scale    . insulin glargine (LANTUS) 100 UNIT/ML injection Inject 42 Units into the skin 2 (two) times daily. Inject 40 units into the skin 2 times a day- 10 AM and bedtime    . memantine (NAMENDA) 10 MG tablet Take 20 mg by mouth at bedtime.    . pantoprazole (PROTONIX) 40 MG tablet Take 40 mg by mouth daily.    . polyvinyl alcohol (LIQUIFILM TEARS) 1.4 % ophthalmic solution Place 1  drop into both eyes 2 (two) times daily as needed (dry eyes).    . pregabalin (LYRICA) 300 MG capsule Take 300 mg by mouth 2 (two) times daily.    . rifaximin (XIFAXAN) 550 MG TABS tablet Take 550 mg by mouth 2 (two) times daily.    . simvastatin (ZOCOR) 20 MG tablet Take 10 mg by mouth every evening.    Marland Kitchen  spironolactone (ALDACTONE) 25 MG tablet Take 2 tablets (50 mg total) by mouth daily.    . tamsulosin (FLOMAX) 0.4 MG CAPS Take 0.4 mg by mouth every evening.     . traMADol (ULTRAM) 50 MG tablet Take 1 tablet (50 mg total) by mouth every 12 (twelve) hours as needed for moderate pain. (Patient taking differently: Take 50 mg by mouth daily as needed for moderate pain. ) 30 tablet    No current facility-administered medications for this visit.     Allergies  Allergen Reactions  . Contrast Media [Iodinated Diagnostic Agents] Rash    Cannot have contrast  . Fentanyl Itching and Rash  . Gadolinium Rash  . Iodine Rash  . Merbromin Rash  . Other Rash    "Opioids"      Review of Systems:   General:  decreased appetite, decreased energy, no weight gain, no weight loss, no fever  Cardiac:  no chest pain with exertion, no chest pain at rest, + SOB with exertion, + intermittent resting SOB, no PND, no orthopnea, no palpitations, no arrhythmia, no atrial fibrillation, + LE edema, + dizzy spells, + syncope  Respiratory:  + shortness of breath, no home oxygen, no productive cough, no dry cough, no bronchitis, + wheezing, no hemoptysis, no asthma, no pain with inspiration or cough, + sleep apnea, + CPAP at night  GI:   no difficulty swallowing, no reflux, no frequent heartburn, no hiatal hernia, no abdominal pain, no constipation, no diarrhea, no hematochezia, no hematemesis, no melena  GU:   no dysuria,  no frequency, no urinary tract infection, no hematuria, no enlarged prostate, no kidney stones, no kidney disease  Vascular:  no pain suggestive of claudication, no pain in feet, no leg cramps, no  varicose veins, no DVT, no non-healing foot ulcer  Neuro:   no stroke, + TIA's, no seizures, no headaches, no temporary blindness one eye,  no slurred speech, + peripheral neuropathy, no chronic pain, + instability of gait, + memory/cognitive dysfunction  Musculoskeletal: + arthritis, + joint swelling, no myalgias, + difficulty walking, very limited mobility   Skin:   no rash, no itching, no skin infections, no pressure sores or ulcerations  Psych:   no anxiety, no depression, no nervousness, no unusual recent stress  Eyes:   no blurry vision, no floaters, no recent vision changes, + wears glasses or contacts  ENT:   no hearing loss, no loose or painful teeth, edentulous with dentures  Hematologic:  no easy bruising, no abnormal bleeding, no clotting disorder, no frequent epistaxis  Endocrine:  + diabetes, does check CBG's at home           Physical Exam:   BP (!) 100/59   Pulse 61   Resp 20   Ht 5' 10"  (1.778 m)   Wt (!) 305 lb (138.3 kg)   SpO2 93% Comment: RA  BMI 43.76 kg/m   General:  Morbidly obese, chronically ill-appearing but NAD  HEENT:  Unremarkable   Neck:   no JVD, no bruits, no adenopathy   Chest:   clear to auscultation, symmetrical breath sounds, no wheezes, no rhonchi   CV:   RRR, grade II-III/VI crescendo/decrescendo murmur heard best at RSB,  no diastolic murmur  Abdomen:  soft, non-tender, no masses, + distended  Extremities:  warm, well-perfused, pulses not palpable, mild LE edema  Rectal/GU  Deferred  Neuro:   Grossly non-focal and symmetrical throughout  Skin:   Clean and dry, no rashes, no breakdown  Diagnostic Tests:  Transthoracic Echocardiography  Patient:    Keith, [name] MR #:       [id number] Study Date: 04/15/2018 Gender:     M Age:        89 Height:     177.8 cm Weight:     138.9 kg BSA:        2.69 m^2 Pt. Status: Room:       3E01C   PERFORMING   Adrian Prows, MD  ADMITTING    Karmen Bongo  Jerry Caras, Lezlie Octave  REFERRING    Radene Gunning  SONOGRAPHER  Maudry Mayhew, RVT, RDCS, RDMS  ATTENDING    Fuller Plan A  cc:  ------------------------------------------------------------------- LV EF: 45% -   50%  ------------------------------------------------------------------- Indications:      CHF - 428.0.  ------------------------------------------------------------------- History:   Risk factors:  Diabetes mellitus.  ------------------------------------------------------------------- Study Conclusions  - Left ventricle: Very poor echo window. In spite of contrast   injection, wall motion was difficult to be seen. Grossly appears   to have low normal LVEF to mildly reduced. The cavity size was   mildly dilated. There was mild concentric hypertrophy. Systolic   function was mildly reduced. The estimated ejection fraction was   in the range of 45% to 50%. There was an increased relative   contribution of atrial contraction to ventricular filling.   Doppler parameters are consistent with a reversible restrictive   pattern, indicative of decreased left ventricular diastolic   compliance and/or increased left atrial pressure (grade 3   diastolic dysfunction). Doppler parameters are consistent with   both elevated ventricular end-diastolic filling pressure and   elevated left atrial filling pressure. - Aortic valve: There was moderate stenosis by mean PG and peak PG.   Consider TEE to better evaluate the AV structure. Mean gradient   (S): 22 mm Hg. Peak gradient (S): 39 mm Hg. Valve area (VTI):   1.24 cm^2. Valve area (Vmax): 1.14 cm^2. Valve area (Vmean): 1.08   cm^2. - Mitral valve: Calcified annulus. Valve area by continuity   equation (using LVOT flow): 2.27 cm^2. - Left atrium: The atrium was mildly dilated.  ------------------------------------------------------------------- Study data:  No prior study was available for comparison.   Study status:  Routine.  Procedure:  Transthoracic echocardiography. Image quality was adequate. The study was technically difficult, as a result of poor acoustic windows, poor sound wave transmission, restricted patient mobility, and body habitus. Intravenous contrast (Definity) was administered.  Study completion:  There were no complications.          Transthoracic echocardiography.  M-mode, complete 2D, spectral Doppler, and color Doppler.  Birthdate: Patient birthdate: 1940/06/13.  Age:  Patient is 78 yr old.  Sex: Gender: male.    BMI: 43.9 kg/m^2.  Blood pressure:     106/63 Patient status:  Inpatient.  Study date:  Study date: 04/15/2018. Study time: 08:44 AM.  Location:  Echo laboratory.  -------------------------------------------------------------------  ------------------------------------------------------------------- Left ventricle:  Very poor echo window. In spite of contrast injection, wall motion was difficult to be seen. Grossly appears to have low normal LVEF to mildly reduced. The cavity size was mildly dilated. There was mild concentric hypertrophy. Systolic function was mildly reduced. The estimated ejection fraction was in the range of 45% to 50%. Images were inadequate for LV wall motion assessment. There was an increased relative contribution of atrial contraction to ventricular filling. Doppler parameters are  consistent with a reversible restrictive pattern, indicative of decreased left ventricular diastolic compliance and/or increased left atrial pressure (grade 3 diastolic dysfunction). Doppler parameters are consistent with both elevated ventricular end-diastolic filling pressure and elevated left atrial filling pressure.  ------------------------------------------------------------------- Aortic valve:  Poorly visualized.  Normal-sized annulus. Severely calcified leaflets.  Doppler:  There was moderate stenosis by mean PG and peak PG. Consider TEE  to better evaluate the AV structure.  VTI ratio of LVOT to aortic valve: 0.33. Valve area (VTI): 1.24 cm^2. Indexed valve area (VTI): 0.46 cm^2/m^2. Peak velocity ratio of LVOT to aortic valve: 0.3. Valve area (Vmax): 1.14 cm^2. Indexed valve area (Vmax): 0.42 cm^2/m^2. Mean velocity ratio of LVOT to aortic valve: 0.28. Valve area (Vmean): 1.08 cm^2. Indexed valve area (Vmean): 0.4 cm^2/m^2.    Mean gradient (S): 22 mm Hg. Peak gradient (S): 39 mm Hg.  ------------------------------------------------------------------- Aorta:  The aorta was normal, not dilated, and non-diseased. Aortic root: The aortic root was normal in size at 3.8 cm, probably normal for his BSA. Ascending aorta: The ascending aorta was normal in size.  ------------------------------------------------------------------- Mitral valve:   Calcified annulus.  Doppler:  There was trivial regurgitation.    Valve area by pressure half-time: 3.38 cm^2. Indexed valve area by pressure half-time: 1.26 cm^2/m^2. Valve area by continuity equation (using LVOT flow): 2.27 cm^2. Indexed valve area by continuity equation (using LVOT flow): 0.84 cm^2/m^2. Mean gradient (D): 4 mm Hg. Peak gradient (D): 9 mm Hg.  ------------------------------------------------------------------- Left atrium:  The atrium was mildly dilated.  ------------------------------------------------------------------- Atrial septum:  Poorly visualized.  ------------------------------------------------------------------- Right ventricle:  The cavity size was normal. Wall thickness was normal. Pacer wire or catheter noted in right ventricle. Systolic function was normal.  ------------------------------------------------------------------- Pulmonic valve:    Structurally normal valve. The valve appears to be grossly normal.   Cusp separation was normal.  Doppler: Transvalvular velocity was within the normal range. There was no significant  regurgitation.  ------------------------------------------------------------------- Tricuspid valve:   Structurally normal valve.   Leaflet separation was normal.  Doppler:  Transvalvular velocity was within the normal range. There was trivial regurgitation.  ------------------------------------------------------------------- Pulmonary artery:   Poorly visualized.  ------------------------------------------------------------------- Right atrium:  The atrium was normal in size.  ------------------------------------------------------------------- Pericardium:  The pericardium was normal in appearance.  ------------------------------------------------------------------- Systemic veins: Inferior vena cava: IVC was not well visualized.  ------------------------------------------------------------------- Post procedure conclusions Ascending Aorta:  - The aorta was normal, not dilated, and non-diseased.  ------------------------------------------------------------------- Measurements   Left ventricle                           Value          Reference  LV ID, ED, PLAX chordal          (H)     57.8  mm       43 - 52  LV ID, ES, PLAX chordal          (H)     52.9  mm       23 - 38  LV fx shortening, PLAX chordal   (L)     8     %        >=29  LV PW thickness, ED                      8.04  mm       ----------  IVS/LV PW ratio, ED  1.21           <=1.3  Stroke volume, 2D                        84    ml       ----------  Stroke volume/bsa, 2D                    31    ml/m^2   ----------  LV e&', lateral                           9.81  cm/s     ----------  LV E/e&', lateral                         15.29          ----------  LV e&', medial                            8.24  cm/s     ----------  LV E/e&', medial                          18.2           ----------  LV e&', average                           9.03  cm/s     ----------  LV E/e&', average                          16.62          ----------    Ventricular septum                       Value          Reference  IVS thickness, ED                        9.7   mm       ----------    LVOT                                     Value          Reference  LVOT ID, S                               22    mm       ----------  LVOT area                                3.8   cm^2     ----------  LVOT peak velocity, S                    93.3  cm/s     ----------  LVOT mean velocity, S                    62.8  cm/s     ----------  LVOT VTI,  S                              22.2  cm       ----------    Aortic valve                             Value          Reference  Aortic valve peak velocity, S            311   cm/s     ----------  Aortic valve mean velocity, S            221   cm/s     ----------  Aortic valve VTI, S                      67.8  cm       ----------  Aortic mean gradient, S                  22    mm Hg    ----------  Aortic peak gradient, S                  39    mm Hg    ----------  VTI ratio, LVOT/AV                       0.33           ----------  Aortic valve area, VTI                   1.24  cm^2     ----------  Aortic valve area/bsa, VTI               0.46  cm^2/m^2 ----------  Velocity ratio, peak, LVOT/AV            0.3            ----------  Aortic valve area, peak velocity         1.14  cm^2     ----------  Aortic valve area/bsa, peak              0.42  cm^2/m^2 ----------  velocity  Velocity ratio, mean, LVOT/AV            0.28           ----------  Aortic valve area, mean velocity         1.08  cm^2     ----------  Aortic valve area/bsa, mean              0.4   cm^2/m^2 ----------  velocity    Aorta                                    Value          Reference  Aortic root ID, ED                       38    mm       ----------    Left atrium  Value          Reference  LA ID, A-P, ES                           46    mm       ----------  LA ID/bsa, A-P                            1.71  cm/m^2   <=2.2  LA volume, S                             102   ml       ----------  LA volume/bsa, S                         38    ml/m^2   ----------  LA volume, ES, 1-p A4C                   102   ml       ----------  LA volume/bsa, ES, 1-p A4C               38    ml/m^2   ----------  LA volume, ES, 1-p A2C                   97    ml       ----------  LA volume/bsa, ES, 1-p A2C               36.1  ml/m^2   ----------    Mitral valve                             Value          Reference  Mitral E-wave peak velocity              150   cm/s     ----------  Mitral A-wave peak velocity              46.3  cm/s     ----------  Mitral mean velocity, D                  90.9  cm/s     ----------  Mitral deceleration time                 158   ms       150 - 230  Mitral pressure half-time                65    ms       ----------  Mitral mean gradient, D                  4     mm Hg    ----------  Mitral peak gradient, D                  9     mm Hg    ----------  Mitral E/A ratio, peak                   3.2            ----------  Mitral valve area, PHT, DP  3.38  cm^2     ----------  Mitral valve area/bsa, PHT, DP           1.26  cm^2/m^2 ----------  Mitral valve area, LVOT                  2.27  cm^2     ----------  continuity  Mitral valve area/bsa, LVOT              0.84  cm^2/m^2 ----------  continuity  Mitral annulus VTI, D                    37.2  cm       ----------    Tricuspid valve                          Value          Reference  Tricuspid regurg peak velocity           216   cm/s     ----------  Tricuspid peak RV-RA gradient            19    mm Hg    ----------    Right atrium                             Value          Reference  RA ID, S-I, ES, A4C              (H)     72.3  mm       34 - 49  RA area, ES, A4C                 (H)     31.1  cm^2     8.3 - 19.5  RA volume, ES, A/L                       108   ml       ----------  RA  volume/bsa, ES, A/L                   40.2  ml/m^2   ----------    Right ventricle                          Value          Reference  TAPSE                                    22.6  mm       ----------  Legend: (L)  and  (H)  mark values outside specified reference range.  ------------------------------------------------------------------- Prepared and Electronically Authenticated by  Adrian Prows, MD 2020-01-08T15:07:53   TEE: Indication: Aortic stenosis. Under moderate sedation, using 2 mg versed and 25 mg benadryl IV,  TEE was performed without complications: LV: LVH. Global hypokinesis. EF 35-40%.. RV: Normal. Pacemaker lead noted.  LA: Normal. Left atrial appendage: Normal without thrombus. Normal function. Inter atrial septum shows a small PFO with Color Doppler e/o left to right shunt.  RA: Normal, Eusthesian valve noted.   MV: Mild MR, central. TV: Normal Trace TR AV: Severely calcified. Trileaflet. Low gradient severe AS. Peak PG of 37 and mean PG is  21 mm Hg. AVA 1.02 cm2 by continuity. Planimetry valve area 0.7 cm2. PV: Normal. Trace PI.  Thoracic and ascending aorta: Diffuse mild atheresclerotic changes noted.   Conscious sedation protocol was followed, I personally administered conscious sedation and monitored the patient.  Patient tolerated the procedure well and there was no complication from conscious sedation. Time administered was 24  Minutes.  Adrian Prows, MD 04/16/2018, 9:50 AM [facility] [place] Pager: [phone] Office: [phone] If no answer: Cell:  [phone]    INTRAVASCULAR PRESSURE WIRE/FFR STUDY  RIGHT/LEFT HEART CATH AND CORONARY ANGIOGRAPHY  Conclusion   Right and left heart catheterization 04/17/2018: RA 13/12, mean 11 mmHg.;  RV 50/12, EDP 5 mmHg.;  PA 49/13, mean 35 mmHg, PA saturation 61%.;  PW 28/39, mean 28 mmHg.  Aortic saturation was 95%. CO 6.34, CI 2.53.  QP/QS 1.00. Aortic peak to peak gradient 30 mmHg, mean 25.1  mmHg, valve area 1.10 cm.  Left main: Mildly calcified.  No significant disease. LAD: Mild to moderately calcified.  Ostial 50 to 60% stenosis.  DFR 0.95, FFR 0.89, not hemodynamically significant.  Mild diffuse disease throughout the LAD.  Large D1. RI: Mild diffuse disease. CX: Mild calcification, no significant disease. RCA: Very large and ectatic, moderately calcified diffusely.  Slow flow is evident.  No high-grade stenosis is evident.  Large PL and PDA branches.  Impression: No significant coronary artery disease, moderate disease in the ostial LAD.  Moderate aortic stenosis.  Moderate pulmonary hypertension with elevated moderate LVEDP.  Recommendation: Patient will be diuresed today, he will potentially be discharged home in the morning, TAVR team is evaluating the patient for possible TAVR.  Surgeon Notes     04/16/2018 9:50 AM CV Procedure signed by Adrian Prows, MD  Indications   Nonrheumatic aortic valve stenosis [I35.0 (ICD-10-CM)]  Acute combined systolic and diastolic heart failure ([place]) [I50.41 (ICD-10-CM)]  Procedural Details   Technical Details Procedure performed: Ultrasound-guided access of right antecubital vein and also right radial arterial access. Diagnostic catheterization, left and right coronary arteriogram, left ventricular hemodynamic calculation by crossing the aortic valve. Right heart catheterization and calculation of cardiac output by Fick.  Indication: Patient with acute decompensated systolic and diastolic heart failure, suspicion for moderate to severe aortic valve stenosis, patient is being evaluated for TAVR.  Technique: Under sterile precautions, ultrasound-guided right antecubital vein access obtained and also 5 French sheath placed. Right heart catheterization was performed. This was followed by accessing the right radial artery, 6 French sheath introduced. Tiger for diagnostic catheter was utilized to perform diagnostic left and right coronary  arteriogram. This was followed by exchanging to a 5 [place] AL-1 guide catheter, I was able to cross the aortic valve with mild to moderate amount of difficulty. Left ventricular hemodynamics and pressure gradient across the aortic valve was calculated. This was followed by engaging the left main coronary artery, administration of intravenous heparin and [place] FFR guidewire was utilized to perform FFR to the proximal LAD lesion. The guidewire was withdrawn angiography. Guide catheter disengaged and pulled out of the body. Hemostasis obtained by applying TR band. 90 mL contrast utilized. No immediate complications.  Estimated blood loss <50 mL.   During this procedure medications were administered to achieve and maintain moderate conscious sedation while the patient's heart rate, blood pressure, and oxygen saturation were continuously monitored and I was present face-to-face 100% of this time.  Medications  (Filter: Administrations occurring from 04/17/18 0911 to 04/17/18 1054)  Medication Rate/Dose/Volume Action  Date Time  Heparin (Porcine) in NaCl 1000-0.9 UT/500ML-% SOLN (mL) 500 mL Given 04/17/18 0933   Total dose as of 06/10/18 1626        500 mL        Heparin (Porcine) in NaCl 1000-0.9 UT/500ML-% SOLN (mL) 500 mL Given 04/17/18 0933   Total dose as of 06/10/18 1626        500 mL        lidocaine (PF) (XYLOCAINE) 1 % injection (mL) 2 mL Given 04/17/18 0953   Total dose as of 06/10/18 1626 2 mL Given 1001   4 mL        midazolam (VERSED) injection (mg) 1 mg Given 04/17/18 0959   Total dose as of 06/10/18 1626        1 mg        Radial Cocktail/Verapamil only (mL) 3 mL Given 04/17/18 1002   Total dose as of 06/10/18 1626        3 mL        heparin injection (Units) 5,000 Units Given 04/17/18 1010   Total dose as of 06/10/18 1626 3,000 Units Given 1014   8,000 Units        adenosine (diagnostic) 140 mcg/kg/min (mcg/kg/min) 140 mcg/kg/min - 388.1 mL/hr New Bag/Given 04/17/18 1031    Dosing weight:  138.6 kg  Stopped 1035   Total dose as of 06/10/18 1626        68.88 mg        iohexol (OMNIPAQUE) 350 MG/ML injection (mL) 75 mL Given 04/17/18 1039   Total dose as of 06/10/18 1626        75 mL        heparin injection (Units) 2,000 Units Given 04/17/18 1022   Total dose as of 06/10/18 1626        2,000 Units        0.9 % sodium chloride infusion (mL) *Not included in total [facility] Hold 04/17/18 0911   Dosing weight:  142.9 kg        Total dose as of 06/10/18 1626        Cannot be calculated        acetaminophen (TYLENOL) tablet 650 mg (mg) *Not included in total [facility] Hold 04/17/18 0911   Dosing weight:  142.9 kg        Total dose as of 06/10/18 1626        Cannot be calculated        amitriptyline (ELAVIL) tablet 37.5 mg (mg) *Not included in total [facility] Hold 04/17/18 0911   Dosing weight:  142.9 kg        Total dose as of 06/10/18 1626        Cannot be calculated        aspirin EC tablet 81 mg (mg) *Not included in total [facility] Hold 04/17/18 0911   Dosing weight:  142.9 kg *Not included in total Automatically Held 1000   Total dose as of 06/10/18 1626        Cannot be calculated        carvedilol (COREG) tablet 3.125 mg (mg) *Not included in total [facility] Hold 04/17/18 0911   Dosing weight:  138.9 kg        Total dose as of 06/10/18 1626        Cannot be calculated        clopidogrel (PLAVIX) tablet 75 mg (mg) *Not included in total [facility] Hold 04/17/18 0911   Total dose as of 06/10/18  1626 *Not included in total Automatically Held 1000   Cannot be calculated        enoxaparin (LOVENOX) injection 40 mg (mg) *Not included in total [facility] Hold 04/17/18 0911   Dosing weight:  142.9 kg        Total dose as of 06/10/18 1626        Cannot be calculated        finasteride (PROSCAR) tablet 5 mg (mg) *Not included in total [facility] Hold 04/17/18 0911   Total dose as of 06/10/18 1626 *Not included in total Automatically Held 1000   Cannot be calculated        folic acid (FOLVITE) tablet 1  mg (mg) *Not included in total [facility] Hold 04/17/18 0911   Total dose as of 06/10/18 1626 *Not included in total Automatically Held 1000   Cannot be calculated        furosemide (LASIX) injection 40 mg (mg) *Not included in total [facility] Hold 04/17/18 0911   Dosing weight:  138.1 kg *Not included in total Automatically Held 1000   Total dose as of 06/10/18 1626        Cannot be calculated        insulin aspart (novoLOG) injection 0-15 Units (Units) *Not included in total [facility] Hold 04/17/18 0911   Dosing weight:  142.9 kg        Total dose as of 06/10/18 1626        Cannot be calculated        insulin aspart (novoLOG) injection 0-5 Units (Units) *Not included in total [facility] Hold 04/17/18 0911   Dosing weight:  142.9 kg        Total dose as of 06/10/18 1626        Cannot be calculated        insulin glargine (LANTUS) injection 42 Units (Units) *Not included in total [facility] Hold 04/17/18 0911   Total dose as of 06/10/18 1626 *Not included in total Automatically Held 1000   Cannot be calculated        lactulose (CHRONULAC) 10 GM/15ML solution 15 g (g) *Not included in total [facility] Hold 04/17/18 0911   Dosing weight:  142.9 kg *Not included in total Automatically Held 1000   Total dose as of 06/10/18 1626        Cannot be calculated        lisinopril (PRINIVIL,ZESTRIL) tablet 2.5 mg (mg) *Not included in total [facility] Hold 04/17/18 0911   Dosing weight:  142.9 kg *Not included in total Automatically Held 1000   Total dose as of 06/10/18 1626        Cannot be calculated        memantine (NAMENDA) tablet 20 mg (mg) *Not included in total [facility] Hold 04/17/18 0911   Dosing weight:  142.9 kg        Total dose as of 06/10/18 1626        Cannot be calculated        ondansetron (ZOFRAN) injection 4 mg (mg) *Not included in total [facility] Hold 04/17/18 0911   Dosing weight:  142.9 kg        Total dose as of 06/10/18 1626        Cannot be calculated        pantoprazole (PROTONIX) EC tablet 40 mg (mg) *Not included in total  [facility] Hold 04/17/18 0911   Total dose as of 06/10/18 1626 *Not included in total Automatically Held 1000   Cannot be calculated  potassium chloride SA (K-DUR,KLOR-CON) CR tablet 20 mEq (mEq) *Not included in total [facility] Hold 04/17/18 0911   Dosing weight:  138.1 kg *Not included in total Automatically Held 1000   Total dose as of 06/10/18 1626        Cannot be calculated        pregabalin (LYRICA) capsule 300 mg (mg) *Not included in total [facility] Hold 04/17/18 0911   Dosing weight:  142.9 kg *Not included in total Automatically Held 1000   Total dose as of 06/10/18 1626        Cannot be calculated        rifaximin (XIFAXAN) tablet 550 mg (mg) *Not included in total [facility] Hold 04/17/18 0911   Dosing weight:  138.9 kg *Not included in total Automatically Held 1000   Total dose as of 06/10/18 1626        Cannot be calculated        simvastatin (ZOCOR) tablet 10 mg (mg) *Not included in total [facility] Hold 04/17/18 0911   Total dose as of 06/10/18 1626        Cannot be calculated        sodium chloride flush (NS) 0.9 % injection 3 mL (mL) *Not included in total [facility] Hold 04/17/18 0911   Dosing weight:  142.9 kg *Not included in total Automatically Held 1000   Total dose as of 06/10/18 1626        Cannot be calculated        sodium chloride flush (NS) 0.9 % injection 3 mL (mL) *Not included in total [facility] Hold 04/17/18 0911   Dosing weight:  142.9 kg        Total dose as of 06/10/18 1626        Cannot be calculated        tamsulosin (FLOMAX) capsule 0.4 mg (mg) *Not included in total [facility] Hold 04/17/18 0911   Dosing weight:  142.9 kg        Total dose as of 06/10/18 1626        Cannot be calculated        traMADol (ULTRAM) tablet 50 mg (mg) *Not included in total [facility] Hold 04/17/18 0911   Dosing weight:  142.9 kg        Total dose as of 06/10/18 1626        Cannot be calculated        Sedation Time   Sedation Time Physician-1: 38 minutes 36 seconds  Coronary Findings   Diagnostic  Dominance:  Right  Left Anterior Descending  There is mild diffuse disease throughout the vessel.  Ost LAD to Prox LAD lesion 60% stenosed  Ost LAD to Prox LAD lesion is 60% stenosed. The lesion is calcified. The stenosis was measured by a visual reading. Pressure wire/FFR was performed on the lesion. FFR: 0.89. DFR 0.95 and FFR 0.89 proximal/ostial LAD  Left Circumflex  There is mild diffuse disease throughout the vessel.  Right Coronary Artery  There is mild diffuse disease throughout the vessel.  Right Posterior Descending Artery  The vessel exhibits minimal luminal irregularities.  Intervention   No interventions have been documented.  Right Heart   Right Heart Pressures Hemodynamic findings consistent with moderate pulmonary hypertension. Elevated LV EDP consistent with volume overload.  Left Heart   Left Ventricle LV end diastolic pressure is moderately elevated.  Aortic Valve There is moderate aortic valve stenosis.  Aorta Aortic Root: The aortic root is normal.  Coronary Diagrams  Diagnostic  Dominance: Right    Intervention   Implants    No implant documentation for this case.  Syngo Images   Show images for CARDIAC CATHETERIZATION  MERGE Images   Show images for CARDIAC CATHETERIZATION   Link to Procedure Log   Procedure Log    Hemo Data    Most Recent Value  Fick Cardiac Output 6.34 L/min  Fick Cardiac Output Index 2.53 (L/min)/BSA  Aortic Mean Gradient 25.1 mmHg  Aortic Peak Gradient 30 mmHg  Aortic Valve Area 1.10  Aortic Value Area Index 0.44 cm2/BSA  RA A Wave 13 mmHg  RA V Wave 12 mmHg  RA Mean 11 mmHg  RV Systolic Pressure 50 mmHg  RV Diastolic Pressure 12 mmHg  RV EDP 5 mmHg  PA Systolic Pressure 49 mmHg  PA Diastolic Pressure 13 mmHg  PA Mean 35 mmHg  PW A Wave 28 mmHg  PW V Wave 39 mmHg  PW Mean 28 mmHg  AO Systolic Pressure 712 mmHg  AO Diastolic Pressure 59 mmHg  AO Mean 79 mmHg  LV Systolic Pressure 458 mmHg  LV Diastolic Pressure 9  mmHg  LV EDP 22 mmHg  AOp Systolic Pressure 099 mmHg  AOp Diastolic Pressure 64 mmHg  AOp Mean Pressure 81 mmHg  LVp Systolic Pressure 833 mmHg  LVp Diastolic Pressure 13 mmHg  LVp EDP Pressure 25 mmHg  QP/QS 1  TPVR Index 13.81 HRUI  TSVR Index 31.18 HRUI  PVR SVR Ratio 0.1  TPVR/TSVR Ratio 0.44      TECHNIQUE: The patient was scanned on a [employer]. A 120 kV retrospective scan was triggered in the descending thoracic aorta at 111 HU's. Gantry rotation speed was 250 msecs and collimation was .6 mm. No beta blockade or nitro were given. The 3D data set was reconstructed in 5% intervals of the R-R cycle. Systolic and diastolic phases were analyzed on a dedicated work station using MPR, MIP and VRT modes. The patient received 80 cc of contrast.  FINDINGS: Study quality affected by motion.  Aortic Valve: Trileaflet with moderately thickened and calcified leaflets and no calcifications extending into the LVOT.  Aorta: Normal size with minimal atherosclerotic plaque and calcifications. No dissection.  Sinotubular Junction: 35 x 35 mm  Ascending Thoracic Aorta: 37 x 35 mm  Aortic Arch:  Descending Thoracic Aorta: 27 x 27 mm  Sinus of Valsalva Measurements:  Non-coronary: 37 mm  Right -coronary: 39 mm  Left -coronary: 40 mm  Coronary Artery Height above Annulus:  Left Main: 10 mm  Right Coronary: 15 mm  Virtual Basal Annulus Measurements:  Maximum/Minimum Diameter: 31.8 x 25.1  Mean Diameter: 27.7 mm  Perimeter: 89.5 mm  Area: 601 mm2  Optimum Fluoroscopic Angle for Delivery: LAO 0 CAU 0  IMPRESSION: 1. Trileaflet aortic valve with moderately thickened and calcified leaflets and no calcifications extending into the LVOT. Annular measurements suitable for delivery of a 29 mm Edwards-SAPIEN 3 valve.  2. Sufficient RCA to annulus distance, borderline LM to annulus distance (10 mm).  3. Optimum Fluoroscopic Angle  for Delivery:  LAO 0 CAU 0.  4. No thrombus in the left atrial appendage.   Electronically Signed   By: Ena Dawley   On: 04/22/2018 17:40   CT ANGIOGRAPHY CHEST, ABDOMEN AND PELVIS  TECHNIQUE: Multidetector CT imaging through the chest, abdomen and pelvis was performed using the standard protocol during bolus administration of intravenous contrast. Multiplanar reconstructed images and MIPs were obtained and reviewed to evaluate the  vascular anatomy.  CONTRAST:  87m ISOVUE-370 IOPAMIDOL (ISOVUE-370) INJECTION 76%, 758mISOVUE-370 IOPAMIDOL (ISOVUE-370) INJECTION 76%  COMPARISON:  Chest CT 07/12/2014. CT the abdomen and pelvis 11/03/2010.  FINDINGS: CTA CHEST FINDINGS  Cardiovascular: Heart size is mildly enlarged. There is no significant pericardial fluid, thickening or pericardial calcification. There is aortic atherosclerosis, as well as atherosclerosis of the great vessels of the mediastinum and the coronary arteries, including calcified atherosclerotic plaque in the left main, left anterior descending, left circumflex and right coronary arteries. Severe thickening calcification of the aortic valve. Left-sided pacemaker in place with lead tips terminating in the right atrium and in the proximal right ventricle immediately adjacent to the tricuspid valve.  Mediastinum/Lymph Nodes: No pathologically enlarged mediastinal or hilar lymph nodes. Esophagus is unremarkable in appearance. No axillary lymphadenopathy.  Lungs/Pleura: 5 mm right upper lobe pulmonary nodule (axial image 34 of series 16), stable in retrospect compared to prior study from 07/12/2014, considered definitively benign. No other suspicious appearing pulmonary nodules or masses are noted. No acute consolidative airspace disease. Small right pleural effusion lying dependently. No left pleural effusion.  Musculoskeletal/Soft Tissues: Old healed fractures of the anterolateral aspects of  the right sixth and seventh ribs. There are no aggressive appearing lytic or blastic lesions noted in the visualized portions of the skeleton.  CTA ABDOMEN AND PELVIS FINDINGS  Hepatobiliary: Liver has a shrunken appearance and very nodular contour, indicative of cirrhosis. In the central aspect of the liver between segments 4A, 4B, 8 and 5 there is a 3.8 x 2.6 cm ill-defined hypervascular lesion (axial image 70 of series 15) which is incompletely characterized. No intra or extrahepatic biliary ductal dilatation. Status post cholecystectomy.  Pancreas: No pancreatic mass. No pancreatic ductal dilatation. No pancreatic or peripancreatic fluid or inflammatory changes.  Spleen: Spleen is enlarged measuring 16.3 x 8.4 x 15.9 cm (estimated splenic volume of 1,089 mL) .  Adrenals/Urinary Tract: 3.2 x 2.0 cm right adrenal mass, slightly smaller than prior study from 07/12/2014, previously characterized as an adenoma. Left adrenal gland is normal in appearance. Bilateral kidneys are normal in appearance. No hydroureteronephrosis. Urinary bladder is normal in appearance.  Stomach/Bowel: Normal appearance of the stomach. No pathologic dilatation of small bowel or colon. The appendix is not confidently identified may be surgically absent.  Vascular/Lymphatic: Aortic atherosclerosis, with vascular findings and measurements pertinent to potential TAVR procedure, as detailed below. No aneurysm or dissection noted in the abdominal or pelvic vasculature. Mildly enlarged celiac axis lymph node measuring 13 mm in short axis. Enlarged portacaval lymph node measuring 17 mm in short axis.  Reproductive: Prostate gland and seminal vesicles are unremarkable in appearance.  Other: Soft tissue stranding and multiple small soft tissue attenuation nodules scattered throughout the omentum, highly concerning for malignant omental caking. Small volume of ascites.  No pneumoperitoneum.  Musculoskeletal: There are no aggressive appearing lytic or blastic lesions noted in the visualized portions of the skeleton.  VASCULAR MEASUREMENTS PERTINENT TO TAVR:  AORTA:  Minimal Aortic Diameter-20 x 17 mm  Severity of Aortic Calcification-moderate  RIGHT PELVIS:  Right Common Iliac Artery -  Minimal Diameter-11.9 x 12.4 mm  Tortuosity-mild  Calcification-moderate  Right External Iliac Artery -  Minimal Diameter-9.8 x 8.0 mm  Tortuosity-moderate to severe  Calcification-mild  Right Common Femoral Artery -  Minimal Diameter-9.7 x 8.6 mm  Tortuosity-mild  Calcification-mild  LEFT PELVIS:  Left Common Iliac Artery -  Minimal Diameter-11.7 x 11.1 mm  Tortuosity-mild  Calcification-moderate  Left External Iliac Artery -  Minimal Diameter-9.9 x 8.9 mm  Tortuosity-moderate to severe  Calcification-mild  Left Common Femoral Artery -  Minimal Diameter-12.6 x 12.0 mm  Tortuosity-mild  Calcification-mild  Review of the MIP images confirms the above findings.  IMPRESSION: 1. Vascular findings and measurements pertinent to potential TAVR procedure, as detailed above. 2. Severe thickening calcification of the aortic valve, compatible with the reported clinical history of severe aortic stenosis. 3. Morphologic changes in the liver indicative of advanced cirrhosis. Importantly, there is an indeterminate hypervascular lesion in the central aspect of the liver measuring approximately 3.8 x 2.6 cm. Further characterization of this lesion with nonemergent MRI of the abdomen with and without IV gadolinium is strongly recommended in the near future to exclude the possibility of hepatocellular carcinoma. 4. In addition, there is upper abdominal lymphadenopathy, omental caking and small volume of ascites which is concerning for peritoneal spread of malignancy. 5. Small right pleural effusion lying  dependently. 6. Aortic atherosclerosis, in addition to left main and 3 vessel coronary artery disease. Assessment for potential risk factor modification, dietary therapy or pharmacologic therapy may be warranted, if clinically indicated. 7. Additional incidental findings, as above.  These results will be called to the ordering clinician or representative by the Radiologist Assistant, and communication documented in the PACS or zVision Dashboard.   Electronically Signed   By: Vinnie Langton M.D.   On: 04/22/2018 13:28   Impression:  Patient has stage D low ejection fraction, low gradient, severe symptomatic aortic stenosis.  He was recently hospitalized with acute exacerbation of chronic shortness of breath and volume overload that is further exacerbated by his numerous chronic medical problems including morbid obesity and cirrhosis.  He currently describes stable symptoms of shortness of breath with low-level activity and occasionally at rest.  He has chronic lower extremity edema but this is been improved recently.  He has never had any chest pain or chest tightness.  I have personally reviewed the patient's recent transthoracic and transesophageal echocardiograms, diagnostic cardiac catheterization, and CT angiograms.  Echocardiograms suggest the possibility that the patient may have rheumatic aortic valve disease.  Aortic valve is trileaflet.  There is moderate thickening with restricted leaflet mobility involving all 3 leaflets of the aortic valve.  Peak velocity across aortic valve measures greater than 3 m/s corresponding to mean transvalvular gradient in the mid 20s.  Diagnostic cardiac catheterization confirmed similar hemodynamic findings.  On transesophageal echocardiogram valve area planimetry measured only 0.7 cm.  Diagnostic cardiac catheterization revealed what was termed "moderate" disease of the ostial and proximal left anterior descending coronary artery.  Although flow  wire analysis suggested this lesion may not be hemodynamically significant, angiographically it appears quite severe.  I am very concerned that this lesion may be significant, and the distal anterior wall and apex of the left ventricle does appear somewhat hypokinetic.  I agree the patient would likely benefit from aortic valve replacement.  He may need revascularization of his left anterior descending coronary artery.  Unfortunately, I would not consider this patient a candidate for conventional surgical intervention because of his extensive comorbid medical conditions and extremely limited functional status.  Cardiac-gated CTA of the heart reveals anatomical characteristics consistent with aortic stenosis suitable for treatment by transcatheter aortic valve replacement without any significant complicating features and CTA of the aorta and iliac vessels demonstrate what appears to be adequate pelvic vascular access to facilitate a transfemoral approach.    Plan:  The patient and his wife were counseled at length regarding treatment  alternatives for management of severe symptomatic aortic stenosis. Alternative approaches such as conventional aortic valve replacement with coronary artery bypass grafting, transcatheter aortic valve replacement with or without PCI and stenting of the left anterior descending coronary artery, and continued medical therapy without intervention were compared and contrasted at length.  The risks associated with conventional surgical aortic valve replacement were discussed in detail, as were expectations for post-operative convalescence, and why I would be reluctant to consider this patient a candidate for conventional surgery.  Issues specific to transcatheter aortic valve replacement were discussed including questions about long term valve durability, the potential for paravalvular leak, possible increased risk of need for permanent pacemaker placement, and other technical  complications related to the procedure itself.  Long-term prognosis with medical therapy was discussed. This discussion was placed in the context of the patient's own specific clinical presentation and past medical history.  All of their questions have been addressed.    The patient is scheduled to undergo routine follow-up upper GI endoscopy by his gastroenterologist to make sure he does not have significant esophageal varices.  His multiple diagnostic test will be reviewed by our multidisciplinary team of specialists with particular reference to the patient's diagnostic cardiac catheterization and proximal left anterior descending coronary artery stenosis.  We tentatively plan to proceed with transcatheter aortic valve replacement on July 07, 2018.  Following the decision to proceed with transcatheter aortic valve replacement, a discussion has been held regarding what types of management strategies would be attempted intraoperatively in the event of life-threatening complications, including whether or not the patient would be considered a candidate for the use of cardiopulmonary bypass and/or conversion to open sternotomy for attempted surgical intervention.  The patient has been advised of a variety of complications that might develop including but not limited to risks of death, stroke, paravalvular leak, aortic dissection or other major vascular complications, aortic annulus rupture, device embolization, cardiac rupture or perforation, mitral regurgitation, acute myocardial infarction, arrhythmia, heart block or bradycardia requiring permanent pacemaker placement, congestive heart failure, respiratory failure, renal failure, pneumonia, infection, other late complications related to structural valve deterioration or migration, or other complications that might ultimately cause a temporary or permanent loss of functional independence or other long term morbidity.  The patient provides full informed consent  for the procedure as described and all questions were answered.    I spent in excess of 90 minutes during the conduct of this office consultation and >50% of this time involved direct face-to-face encounter with the patient for counseling and/or coordination of their care.    Valentina Gu. Roxy Manns, MD 06/10/2018 3:23 PM

## 2018-06-10 NOTE — Patient Instructions (Signed)
Continue all previous medications without any changes at this time  

## 2018-06-23 DIAGNOSIS — I350 Nonrheumatic aortic (valve) stenosis: Secondary | ICD-10-CM | POA: Diagnosis not present

## 2018-06-23 DIAGNOSIS — I4439 Other atrioventricular block: Secondary | ICD-10-CM | POA: Diagnosis not present

## 2018-06-23 DIAGNOSIS — K219 Gastro-esophageal reflux disease without esophagitis: Secondary | ICD-10-CM | POA: Diagnosis not present

## 2018-06-23 DIAGNOSIS — I442 Atrioventricular block, complete: Secondary | ICD-10-CM | POA: Diagnosis not present

## 2018-06-23 DIAGNOSIS — G459 Transient cerebral ischemic attack, unspecified: Secondary | ICD-10-CM | POA: Diagnosis not present

## 2018-06-23 DIAGNOSIS — I8500 Esophageal varices without bleeding: Secondary | ICD-10-CM | POA: Diagnosis not present

## 2018-06-23 DIAGNOSIS — C220 Liver cell carcinoma: Secondary | ICD-10-CM | POA: Diagnosis not present

## 2018-06-23 DIAGNOSIS — K766 Portal hypertension: Secondary | ICD-10-CM | POA: Diagnosis not present

## 2018-06-23 DIAGNOSIS — I5032 Chronic diastolic (congestive) heart failure: Secondary | ICD-10-CM | POA: Diagnosis not present

## 2018-06-23 DIAGNOSIS — G4733 Obstructive sleep apnea (adult) (pediatric): Secondary | ICD-10-CM | POA: Diagnosis not present

## 2018-06-24 ENCOUNTER — Telehealth: Payer: Self-pay

## 2018-06-24 NOTE — Telephone Encounter (Signed)
Pt's spouse called wanting to stating that bp has running low-98/70. Wants to know if he should decrease his Lasix which he is currently taking 37m daily and spironolactone 554mqd. Please review and advise.//ah

## 2018-06-24 NOTE — Telephone Encounter (Signed)
No he needs it to contain fluid. Unless severe dizziness

## 2018-06-25 NOTE — Telephone Encounter (Signed)
Spouse-[name] aware.//ah

## 2018-06-30 DIAGNOSIS — Z8572 Personal history of non-Hodgkin lymphomas: Secondary | ICD-10-CM | POA: Diagnosis not present

## 2018-06-30 DIAGNOSIS — I8510 Secondary esophageal varices without bleeding: Secondary | ICD-10-CM | POA: Diagnosis not present

## 2018-06-30 DIAGNOSIS — K766 Portal hypertension: Secondary | ICD-10-CM | POA: Diagnosis not present

## 2018-06-30 DIAGNOSIS — Z6841 Body Mass Index (BMI) 40.0 and over, adult: Secondary | ICD-10-CM | POA: Diagnosis not present

## 2018-06-30 DIAGNOSIS — K3189 Other diseases of stomach and duodenum: Secondary | ICD-10-CM | POA: Diagnosis not present

## 2018-06-30 DIAGNOSIS — I350 Nonrheumatic aortic (valve) stenosis: Secondary | ICD-10-CM | POA: Diagnosis not present

## 2018-06-30 DIAGNOSIS — I110 Hypertensive heart disease with heart failure: Secondary | ICD-10-CM | POA: Diagnosis not present

## 2018-06-30 DIAGNOSIS — G8929 Other chronic pain: Secondary | ICD-10-CM | POA: Diagnosis not present

## 2018-06-30 DIAGNOSIS — Z794 Long term (current) use of insulin: Secondary | ICD-10-CM | POA: Diagnosis not present

## 2018-06-30 DIAGNOSIS — K219 Gastro-esophageal reflux disease without esophagitis: Secondary | ICD-10-CM | POA: Diagnosis not present

## 2018-06-30 DIAGNOSIS — G4733 Obstructive sleep apnea (adult) (pediatric): Secondary | ICD-10-CM | POA: Diagnosis not present

## 2018-06-30 DIAGNOSIS — Z950 Presence of cardiac pacemaker: Secondary | ICD-10-CM | POA: Diagnosis not present

## 2018-06-30 DIAGNOSIS — I442 Atrioventricular block, complete: Secondary | ICD-10-CM | POA: Diagnosis not present

## 2018-06-30 DIAGNOSIS — I8500 Esophageal varices without bleeding: Secondary | ICD-10-CM | POA: Diagnosis not present

## 2018-06-30 DIAGNOSIS — E785 Hyperlipidemia, unspecified: Secondary | ICD-10-CM | POA: Diagnosis not present

## 2018-06-30 DIAGNOSIS — E1140 Type 2 diabetes mellitus with diabetic neuropathy, unspecified: Secondary | ICD-10-CM | POA: Diagnosis not present

## 2018-06-30 DIAGNOSIS — I890 Lymphedema, not elsewhere classified: Secondary | ICD-10-CM | POA: Diagnosis not present

## 2018-07-01 ENCOUNTER — Telehealth: Payer: Self-pay

## 2018-07-01 NOTE — Progress Notes (Signed)
Thanks for the update. He sure does have a lot of significant comorbid issues.

## 2018-07-01 NOTE — Telephone Encounter (Signed)
  [facility]   MULTIDISCIPLINARY HEART VALVE TEAM  This pt is pending TAVR and we were waiting to see what 3/24 EGD at [facility] showed in regards to esophageal varices.  Report pending in Care Everywhere at this time so I reached out to the pt's wife for an update.  Per pt's wife the pt has multiple large esophageal varices that will require banding and multiple procedures.  The pt was tentatively going to be scheduled for TAVR 3/31 and she is aware that this is on hold due to GI issues and Covid-19 restrictions. The pt has a pending apt with Dr Einar Gip 4/1 and I advised that the pt keep this apt unless otherwise instructed by Dr Irven Shelling office. Per Care Everywhere it appears that the pt's first banding procedure is scheduled 3/31.  I will continue to follow this pt.

## 2018-07-08 ENCOUNTER — Encounter: Payer: Self-pay | Admitting: Cardiology

## 2018-07-08 ENCOUNTER — Other Ambulatory Visit: Payer: Self-pay

## 2018-07-08 ENCOUNTER — Ambulatory Visit (INDEPENDENT_AMBULATORY_CARE_PROVIDER_SITE_OTHER): Payer: Medicare Other | Admitting: Cardiology

## 2018-07-08 VITALS — BP 112/70 | HR 82 | Ht 71.0 in | Wt 292.0 lb

## 2018-07-08 DIAGNOSIS — I951 Orthostatic hypotension: Secondary | ICD-10-CM

## 2018-07-08 DIAGNOSIS — D696 Thrombocytopenia, unspecified: Secondary | ICD-10-CM

## 2018-07-08 DIAGNOSIS — I5042 Chronic combined systolic (congestive) and diastolic (congestive) heart failure: Secondary | ICD-10-CM | POA: Diagnosis not present

## 2018-07-08 DIAGNOSIS — I350 Nonrheumatic aortic (valve) stenosis: Secondary | ICD-10-CM

## 2018-07-08 DIAGNOSIS — K7469 Other cirrhosis of liver: Secondary | ICD-10-CM

## 2018-07-08 NOTE — Progress Notes (Signed)
Virtual Visit via Video Note: This visit type was conducted due to national recommendations for restrictions regarding the COVID-19 Pandemic (e.g. social distancing).  This format is felt to be most appropriate for this patient at this time.  All issues noted in this document were discussed and addressed.  No physical exam was performed (except for noted visual exam findings with Telehealth visits).  The patient has consented to conduct a Telehealth visit and understands insurance will be billed.   I connected with@, on 07/08/18 at  by a video enabled telemedicine application and verified that I am speaking with the correct person using two identifiers.   I discussed the limitations of evaluation and management by telemedicine and the availability of in person appointments. The patient expressed understanding and agreed to proceed.   I have discussed with patient regarding the safety during COVID Pandemic and steps and precautions to be taken including social distancing, frequent hand wash and use of detergent soap, gels with the patient. I asked the patient to avoid touching mouth, nose, eyes, ears with her hands. I encouraged regular walking around the neighborhood and exercise and regular diet, as long as social distancing can be maintained.   Subjective:  Primary Physician:  Haywood Pao, MD  Patient ID: Keith [name], male    DOB: [birth year], 78 y.o.   MRN: [id number]  Chief Complaint  Patient presents with  . Aortic Stenosis  . Shortness of Breath    HPI: Keith [name]  is a 77 y.o. male  With extensive medical history including morbid obesity, cirrhosis of liver due to [facility] and also hepatocellular carcinoma S/O partial hepatectomy in 2007,  Esophageal varices, chronic thrombocytopenia follows [facility] (Dr Gordy Levan),  follicular lymphoma S/P chemotherapy 2009 and sees [facility] once every 6 months, obstructive sleep apnea, not on CPAP (non compliant), history of  high degree AV block S/P [employer] dual-chamber pacemaker implantation on 06/25/2016 and pacemaker dependent, diabetes mellitus, hypertension, hyperlipidemia, history of TIA in 2007, was on plavix and ASA and I have discontinued plavix due to bleeding risk since Dec 2019 due to hepatic insufficiency and cirrhosis and also due to chronic thrombocytopenia.   He has moderate to severe AS and admitted to [facility] on 04/14/18 with acute decompensated CHF and found to have new non ischemic cardiomyopathy with EF 30% and felt to be due to AS.    He is been scheduled for TAVR, however this is been put off until GI evaluation is complete.  He did go to [facility] on 07/03/2018, had incomplete EGD and was found to have very large glasses and has been recommended banding of analysis in a stepwise fashion due to large viruses and multiple medicines.  Fortunately he has not had any GI bleeding, states that his dyspnea is stable and improved, leg edema has essentially resolved.  He complains of occasional dizziness, was sitting on the sofa last night felt dizzy for about 2 seconds. He is also increase his physical activity lately and has been able to walk in the house and also outside.  No PND or orthopnea.  No chest pain.  No syncope.   Past Medical History:  Diagnosis Date  . Cirrhosis of liver (HCC)    NASH  . Diabetes mellitus   . Hepatic cancer ([place])   . Prostate hypertrophy   . Shortness of breath   . Stroke ([place])   . Varicose veins   . Weakness of both legs  Past Surgical History:  Procedure Laterality Date  . APPENDECTOMY    . EYE SURGERY     bilateral cataract   . INTRAVASCULAR PRESSURE WIRE/FFR STUDY N/A 04/17/2018   Procedure: INTRAVASCULAR PRESSURE WIRE/FFR STUDY;  Surgeon: Adrian Prows, MD;  Location: [place] [facility];  Service: Cardiovascular;  Laterality: N/A;  . left arm surgery     age 35  . left hand surgery     tendons ripped on left hand  . LIVER RESECTION    .  RIGHT/LEFT HEART CATH AND CORONARY ANGIOGRAPHY N/A 04/17/2018   Procedure: RIGHT/LEFT HEART CATH AND CORONARY ANGIOGRAPHY;  Surgeon: Adrian Prows, MD;  Location: [place] [facility];  Service: Cardiovascular;  Laterality: N/A;  . TEE WITHOUT CARDIOVERSION N/A 04/16/2018   Procedure: TRANSESOPHAGEAL ECHOCARDIOGRAM (TEE);  Surgeon: Adrian Prows, MD;  Location: [place];  Service: Cardiovascular;  Laterality: N/A;  . TONSILLECTOMY    . TOTAL KNEE ARTHROPLASTY Left 12/14/2012   Procedure: LEFT TOTAL KNEE ARTHROPLASTY;  Surgeon: Gearlean Alf, MD;  Location: [facility];  Service: Orthopedics;  Laterality: Left;    Social History   Socioeconomic History  . Marital status: Married    Spouse name: Not on file  . Number of children: Not on file  . Years of education: Not on file  . Highest education level: Not on file  Occupational History  . Occupation: retired  Scientific laboratory technician  . Financial resource strain: Not on file  . Food insecurity:    Worry: Not on file    Inability: Not on file  . Transportation needs:    Medical: Not on file    Non-medical: Not on file  Tobacco Use  . Smoking status: Former Smoker    Packs/day: 3.00    Years: 15.00    Pack years: 45.00    Types: Cigarettes    Last attempt to quit: 1970    Years since quitting: 50.2  . Smokeless tobacco: Never Used  Substance and Sexual Activity  . Alcohol use: No    Alcohol/week: 0.0 standard drinks  . Drug use: No  . Sexual activity: Not Currently  Lifestyle  . Physical activity:    Days per week: Not on file    Minutes per session: Not on file  . Stress: Not on file  Relationships  . Social connections:    Talks on phone: Not on file    Gets together: Not on file    Attends religious service: Not on file    Active member of club or organization: Not on file    Attends meetings of clubs or organizations: Not on file    Relationship status: Not on file  . Intimate partner violence:    Fear of current or ex partner: Not on  file    Emotionally abused: Not on file    Physically abused: Not on file    Forced sexual activity: Not on file  Other Topics Concern  . Not on file  Social History Narrative  . Not on file    Current Outpatient Medications on File Prior to Visit  Medication Sig Dispense Refill  . albuterol (PROVENTIL HFA;VENTOLIN HFA) 108 (90 BASE) MCG/ACT inhaler Inhale 2 puffs into the lungs every 4 (four) hours as needed for wheezing or shortness of breath.     Marland Kitchen amitriptyline (ELAVIL) 25 MG tablet Take 25 mg by mouth at bedtime.     Marland Kitchen aspirin EC 81 MG EC tablet Take 1 tablet (81 mg total) by  mouth daily. 30 tablet 0  . Cholecalciferol (VITAMIN D3) 50 MCG (2000 UT) TABS Take 2,000 Units by mouth daily.    . cyanocobalamin (,VITAMIN B-12,) 1000 MCG/ML injection Inject 1,000 mcg into the muscle every 30 (thirty) days.    . finasteride (PROSCAR) 5 MG tablet Take 5 mg by mouth every evening.     . folic acid (FOLVITE) 1 MG tablet Take 1 mg by mouth daily.    . furosemide (LASIX) 20 MG tablet Take 2 tablets (40 mg total) by mouth every other day. Take 80 mg total for two days then back to 20 mg every other day and aldactone 25 mg every other day. (Patient taking differently: Take 20 mg by mouth daily. Marland Kitchen) 90 tablet 3  . hydrocerin (EUCERIN) CREA Apply 1 application topically daily as needed (dry skin).     . insulin aspart (NOVOLOG) 100 UNIT/ML injection Inject 5-30 Units into the skin See admin instructions. Inject 5-30 units into the skin three times a day before meals, per sliding scale    . insulin glargine (LANTUS) 100 UNIT/ML injection Inject 42 Units into the skin 2 (two) times daily. Inject 40 units into the skin 2 times a day- 10 AM and bedtime    . memantine (NAMENDA) 10 MG tablet Take 20 mg by mouth at bedtime.    . pantoprazole (PROTONIX) 40 MG tablet Take 40 mg by mouth daily.    . polyvinyl alcohol (LIQUIFILM TEARS) 1.4 % ophthalmic solution Place 1 drop into both eyes 2 (two) times daily as  needed (dry eyes).    . pregabalin (LYRICA) 300 MG capsule Take 300 mg by mouth 2 (two) times daily.    . rifaximin (XIFAXAN) 550 MG TABS tablet Take 550 mg by mouth 2 (two) times daily.    . simvastatin (ZOCOR) 20 MG tablet Take 10 mg by mouth every evening.    Marland Kitchen spironolactone (ALDACTONE) 25 MG tablet Take 2 tablets (50 mg total) by mouth daily.    . tamsulosin (FLOMAX) 0.4 MG CAPS Take 0.4 mg by mouth every evening.     . traMADol (ULTRAM) 50 MG tablet Take 1 tablet (50 mg total) by mouth every 12 (twelve) hours as needed for moderate pain. (Patient taking differently: Take 50 mg by mouth daily as needed for moderate pain. ) 30 tablet    No current facility-administered medications on file prior to visit.     Review of Systems  Constitutional: Positive for malaise/fatigue. Negative for weight loss.  Respiratory: Positive for shortness of breath. Negative for cough, hemoptysis and wheezing.   Cardiovascular: Positive for leg swelling (much improved and minimal). Negative for chest pain, palpitations and claudication.  Gastrointestinal: Negative for abdominal pain, blood in stool, constipation, heartburn and vomiting.  Genitourinary: Negative for dysuria.  Musculoskeletal: Positive for back pain and joint pain. Negative for myalgias.  Neurological: Positive for dizziness and weakness (much improved and is now walking). Negative for focal weakness and headaches.  Endo/Heme/Allergies: Does not bruise/bleed easily.  Psychiatric/Behavioral: Negative for depression. The patient is not nervous/anxious.   All other systems reviewed and are negative.     Objective:  Blood pressure 112/70, pulse 82, height 5' 11"  (1.803 m), weight 292 lb (132.5 kg).  Physical Exam  Constitutional: He is oriented to person, place, and time. He appears well-developed.  obese  Eyes: Conjunctivae are normal.  Neck: Neck supple.  Short neck  Pulmonary/Chest: Effort normal. No respiratory distress.  Neurological:  He is alert and oriented  to person, place, and time.   CARDIAC STUDIES:  Right and left heart catheterization 04/17/2018: RA 13/12, mean 11 mmHg.; RV 50/12, EDP 5 mmHg.; PA 49/13, mean 35 mmHg, PA saturation 61%.; PW 28/39, mean 28 mmHg. Aortic saturation was 95%. CO 6.34, CI 2.53. QP/QS 1.00. Aortic peak to peak gradient 30 mmHg, mean 25.1 mmHg, valve area 1.10 cm.  Left main: Mildly calcified. No significant disease. LAD: Mild to moderately calcified. Ostial 50 to 60% stenosis. DFR 0.95, FFR 0.89, not hemodynamically significant. Mild diffuse disease throughout the LAD. Large D1. RI: Mild diffuse disease. CX: Mild calcification, no significant disease. RCA: Very large and ectatic, moderately calcified diffusely. Slow flow is evident. No high-grade stenosis is evident. Large PL and PDA branches.  Impression: No significant coronary artery disease, moderate disease in the ostial LAD. Moderate aortic stenosis. Moderate pulmonary hypertension with elevated moderate LVEDP.  Recommendation: Patient will be diuresed today, he will potentially be discharged home in the morning, TAVR team is evaluating the patient for possible TAVR.  TEE 04/16/2018: LVEF  35 to 40% % with global hypokinesis.  Trileaflet aortic valve with severe calcific degeneration.  Severe aortic stenosis, calculated aortic valve area 0.99 cm.  Planimetry valve area 0.7 cm.  Mild MR and TR.  Mild to moderate aortic atherosclerosis. Peak PG of 37 and mean PG is 21 mm Hg.  EKG 04/15/2018: Sinus rhythm with first-degree AV block, ventricularly paced rhythm. No further analysis.  BMP Latest Ref Rng & Units 04/17/2018 04/17/2018 04/16/2018  Glucose 70 - 99 mg/dL - 136(H) 115(H)  BUN 8 - 23 mg/dL - 15 16  Creatinine 0.61 - 1.24 mg/dL 1.09 1.08 1.28(H)  Sodium 135 - 145 mmol/L - 137 138  Potassium 3.5 - 5.1 mmol/L - 3.5 3.7  Chloride 98 - 111 mmol/L - 103 102  CO2 22 - 32 mmol/L - 27 25  Calcium 8.9 - 10.3 mg/dL -  8.2(L) 8.4(L)     Assessment & Recommendations:   Chronic combined systolic and diastolic CHF, NYHA class 1 (HCC)  Orthostatic hypotension  Other cirrhosis of liver (HCC)  Severe aortic stenosis  Thrombocytopenia (HCC)  Recommendations: Patient needs further GI workup including banding of very large and multiple analysis, his procedure has been put off due to Covid issues.  Fortunately he is remained stable without acute decompensated heart failure.  I have again stressed to him regarding importance of avoiding salt and excessive calories and try to get some more weight off.  He has been constantly losing weight since he has established with [place], since last month has lost additional 10 pounds.  His leg edema has completely resolved.  He is only taking furosemide on a p.r.n. basis.  He is on spironolactone for both esophageal varices and ascites and also for CHF.  He has not been able to tolerate carvedilol which leads to severe hypotension.  His dizziness is probably unrelated to cardiac issues that occurred yesterday while he was sitting, he has a pacemaker in place and it was not exertional dizziness to suggest critical aortic stenosis leading to near syncope.  Hence I simply reassured them and advised him to keep watchful eye on events.  No changes in the medications were done today.  Patient and his wife are very comfortable in waiting for TAVR to be performed, I will wait on doing this as he is extremely high risk for GI bleed as he will need dual antiplatelet therapy, large doses of heparin during procedure and he has  underlying hepatic cirrhosis, bleeding diathesis, large esophageal varices and multiple viruses, chronic thrombocytopenia.  In view of this we'll continue medical therapy.  I'll continue were choking visit again in 3 weeks from now.  I reviewed the results of his labs from the [facility], also reviewed the the notes from [place].   Adrian Prows, MD, [facility]  07/08/2018, 12:47 PM [facility]. [place] Pager: [phone] Office: [phone] If no answer Cell [phone]

## 2018-07-13 ENCOUNTER — Telehealth: Payer: Self-pay

## 2018-07-13 NOTE — Telephone Encounter (Signed)
Pt wife called wanting to know if it safe for him to go to his upper GI scheduled for 07/21/2018 or should he wait until after the covid19

## 2018-07-14 NOTE — Telephone Encounter (Signed)
Take it by the day, if the numbers are coming down, in [place], then okay to proceed as [facility] will take all precautions

## 2018-07-16 DIAGNOSIS — Z1159 Encounter for screening for other viral diseases: Secondary | ICD-10-CM | POA: Diagnosis not present

## 2018-07-16 DIAGNOSIS — Z01818 Encounter for other preprocedural examination: Secondary | ICD-10-CM | POA: Diagnosis not present

## 2018-07-21 DIAGNOSIS — K3189 Other diseases of stomach and duodenum: Secondary | ICD-10-CM | POA: Diagnosis not present

## 2018-07-21 DIAGNOSIS — K7460 Unspecified cirrhosis of liver: Secondary | ICD-10-CM | POA: Diagnosis not present

## 2018-07-21 DIAGNOSIS — C8290 Follicular lymphoma, unspecified, unspecified site: Secondary | ICD-10-CM | POA: Diagnosis not present

## 2018-07-21 DIAGNOSIS — I8510 Secondary esophageal varices without bleeding: Secondary | ICD-10-CM | POA: Diagnosis not present

## 2018-07-21 DIAGNOSIS — E785 Hyperlipidemia, unspecified: Secondary | ICD-10-CM | POA: Diagnosis not present

## 2018-07-21 DIAGNOSIS — E1140 Type 2 diabetes mellitus with diabetic neuropathy, unspecified: Secondary | ICD-10-CM | POA: Diagnosis not present

## 2018-07-21 DIAGNOSIS — Z79899 Other long term (current) drug therapy: Secondary | ICD-10-CM | POA: Diagnosis not present

## 2018-07-21 DIAGNOSIS — K7581 Nonalcoholic steatohepatitis (NASH): Secondary | ICD-10-CM | POA: Diagnosis not present

## 2018-07-21 DIAGNOSIS — Z794 Long term (current) use of insulin: Secondary | ICD-10-CM | POA: Diagnosis not present

## 2018-07-21 DIAGNOSIS — I5032 Chronic diastolic (congestive) heart failure: Secondary | ICD-10-CM | POA: Diagnosis not present

## 2018-07-21 DIAGNOSIS — K766 Portal hypertension: Secondary | ICD-10-CM | POA: Diagnosis not present

## 2018-07-21 DIAGNOSIS — Z950 Presence of cardiac pacemaker: Secondary | ICD-10-CM | POA: Diagnosis not present

## 2018-07-21 DIAGNOSIS — Z7982 Long term (current) use of aspirin: Secondary | ICD-10-CM | POA: Diagnosis not present

## 2018-07-21 DIAGNOSIS — G4733 Obstructive sleep apnea (adult) (pediatric): Secondary | ICD-10-CM | POA: Diagnosis not present

## 2018-07-21 DIAGNOSIS — C220 Liver cell carcinoma: Secondary | ICD-10-CM | POA: Diagnosis not present

## 2018-07-21 DIAGNOSIS — Z6841 Body Mass Index (BMI) 40.0 and over, adult: Secondary | ICD-10-CM | POA: Diagnosis not present

## 2018-08-05 NOTE — Progress Notes (Signed)
Virtual Visit via Video Note: This visit type was conducted due to national recommendations for restrictions regarding the COVID-19 Pandemic (e.g. social distancing).  This format is felt to be most appropriate for this patient at this time.  All issues noted in this document were discussed and addressed.  No physical exam was performed (except for noted visual exam findings with Telehealth visits).  The patient has consented to conduct a Telehealth visit and understands insurance will be billed.   I connected with@, on 08/06/18 at  by a video enabled telemedicine application and verified that I am speaking with the correct person using two identifiers.   I discussed the limitations of evaluation and management by telemedicine and the availability of in person appointments. The patient expressed understanding and agreed to proceed.   I have discussed with patient regarding the safety during COVID Pandemic and steps and precautions to be taken including social distancing, frequent hand wash and use of detergent soap, gels with the patient. I asked the patient to avoid touching mouth, nose, eyes, ears with the hands. I encouraged regular walking around the neighborhood and exercise and regular diet, as long as social distancing can be maintained.  Primary Physician/Referring:  Haywood Pao, MD  Patient ID: Keith [name], male    DOB: Jan 06, 1941, 78 y.o.   MRN: [id number]  Chief Complaint  Patient presents with   Congestive Heart Failure   Leg Swelling    HPI: Keith [name]  is a 78 y.o. male  with extensive medical history including morbid obesity, cirrhosis of liver due to [facility] and also hepatocellular carcinoma S/O partial hepatectomy in 2007,  Esophageal varices, chronic thrombocytopenia follows [facility] (Dr Gordy Levan), follicular lymphoma S/P chemotherapy 2009 and sees [facility] once every 6 months, obstructive sleep apnea, not on CPAP (non compliant), history of high  degree AV block S/P Medtronic dual-chamber pacemaker implantation on 06/25/2016 and pacemaker dependent, diabetes mellitus, hypertension, hyperlipidemia,history of TIA in 2007, was on plavix and ASA and I have discontinued plavix due to bleeding risk since Dec 2019 due to hepatic insufficiency and cirrhosis and also due to chronic thrombocytopenia.   He has moderate to severe AS and admitted to [facility] on 04/14/18 with acute decompensated CHF and found to have new non ischemic cardiomyopathy with EF 30% and felt to be due to AS.    He has been scheduled for TAVR, however this is been put off until GI evaluation is complete.  He did go to [facility] on 07/03/2018, had incomplete EGD and was found to have very large varices and has been recommended banding of varices in a stepwise fashion due to large varices and multiple procedures. He underwent sclerotherapy on 07/21/18 and did well.   He is presently doing well and dyspnea has improved and has had complete resolution of leg edema. No further hypotensive episodes. No chest pain.   He is also increase his physical activity lately and has been able to walk in the house and also outside of his house.    Past Medical History:  Diagnosis Date   Cirrhosis of liver ([place])    NASH   Diabetes mellitus    Hepatic cancer ([place])    Prostate hypertrophy    Shortness of breath    Stroke ([place])    Varicose veins    Weakness of both legs     Past Surgical History:  Procedure Laterality Date   APPENDECTOMY     EYE SURGERY  bilateral cataract    INTRAVASCULAR PRESSURE WIRE/FFR STUDY N/A 04/17/2018   Procedure: INTRAVASCULAR PRESSURE WIRE/FFR STUDY;  Surgeon: Adrian Prows, MD;  Location: [place] [facility];  Service: Cardiovascular;  Laterality: N/A;   left arm surgery     age 31   left hand surgery     tendons ripped on left hand   LIVER RESECTION     RIGHT/LEFT HEART CATH AND CORONARY ANGIOGRAPHY N/A 04/17/2018    Procedure: RIGHT/LEFT HEART CATH AND CORONARY ANGIOGRAPHY;  Surgeon: Adrian Prows, MD;  Location: [place] [facility];  Service: Cardiovascular;  Laterality: N/A;   TEE WITHOUT CARDIOVERSION N/A 04/16/2018   Procedure: TRANSESOPHAGEAL ECHOCARDIOGRAM (TEE);  Surgeon: Adrian Prows, MD;  Location: [place];  Service: Cardiovascular;  Laterality: N/A;   TONSILLECTOMY     TOTAL KNEE ARTHROPLASTY Left 12/14/2012   Procedure: LEFT TOTAL KNEE ARTHROPLASTY;  Surgeon: Gearlean Alf, MD;  Location: [facility];  Service: Orthopedics;  Laterality: Left;    Social History   Socioeconomic History   Marital status: Married    Spouse name: Not on file   Number of children: 0   Years of education: Not on file   Highest education level: Not on file  Occupational History   Occupation: retired  Scientist, product/process development strain: Not on file   Food insecurity:    Worry: Not on file    Inability: Not on file   Transportation needs:    Medical: Not on file    Non-medical: Not on file  Tobacco Use   Smoking status: Former Smoker    Packs/day: 3.00    Years: 15.00    Pack years: 45.00    Types: Cigarettes    Last attempt to quit: 1970    Years since quitting: 50.3   Smokeless tobacco: Never Used  Substance and Sexual Activity   Alcohol use: No    Alcohol/week: 0.0 standard drinks   Drug use: No   Sexual activity: Not Currently  Lifestyle   Physical activity:    Days per week: Not on file    Minutes per session: Not on file   Stress: Not on file  Relationships   Social connections:    Talks on phone: Not on file    Gets together: Not on file    Attends religious service: Not on file    Active member of club or organization: Not on file    Attends meetings of clubs or organizations: Not on file    Relationship status: Not on file   Intimate partner violence:    Fear of current or ex partner: Not on file    Emotionally abused: Not on file    Physically abused: Not on  file    Forced sexual activity: Not on file  Other Topics Concern   Not on file  Social History Narrative   Not on file    Current Outpatient Medications on File Prior to Visit  Medication Sig Dispense Refill   albuterol (PROVENTIL HFA;VENTOLIN HFA) 108 (90 BASE) MCG/ACT inhaler Inhale 2 puffs into the lungs every 4 (four) hours as needed for wheezing or shortness of breath.      amitriptyline (ELAVIL) 25 MG tablet Take 25 mg by mouth at bedtime.      aspirin EC 81 MG EC tablet Take 1 tablet (81 mg total) by mouth daily. 30 tablet 0   Cholecalciferol (VITAMIN D3) 50 MCG (2000 UT) TABS Take 2,000 Units by mouth daily.  cyanocobalamin (,VITAMIN B-12,) 1000 MCG/ML injection Inject 1,000 mcg into the muscle every 30 (thirty) days.     finasteride (PROSCAR) 5 MG tablet Take 5 mg by mouth every evening.      folic acid (FOLVITE) 1 MG tablet Take 1 mg by mouth daily.     furosemide (LASIX) 20 MG tablet Take 20 mg by mouth daily.     hydrocerin (EUCERIN) CREA Apply 1 application topically daily as needed (dry skin).      insulin aspart (NOVOLOG) 100 UNIT/ML injection Inject 5-30 Units into the skin See admin instructions. Inject 5-30 units into the skin three times a day before meals, per sliding scale     insulin glargine (LANTUS) 100 UNIT/ML injection Inject 42 Units into the skin 2 (two) times daily. Inject 40 units into the skin 2 times a day- 10 AM and bedtime     memantine (NAMENDA) 10 MG tablet Take 20 mg by mouth at bedtime.     pantoprazole (PROTONIX) 40 MG tablet Take 40 mg by mouth daily.     polyvinyl alcohol (LIQUIFILM TEARS) 1.4 % ophthalmic solution Place 1 drop into both eyes 2 (two) times daily as needed (dry eyes).     pregabalin (LYRICA) 300 MG capsule Take 300 mg by mouth 2 (two) times daily.     rifaximin (XIFAXAN) 550 MG TABS tablet Take 550 mg by mouth 2 (two) times daily.     simvastatin (ZOCOR) 20 MG tablet Take 10 mg by mouth every evening.      spironolactone (ALDACTONE) 25 MG tablet Take 2 tablets (50 mg total) by mouth daily.     tamsulosin (FLOMAX) 0.4 MG CAPS Take 0.4 mg by mouth every evening.      traMADol (ULTRAM) 50 MG tablet Take 1 tablet (50 mg total) by mouth every 12 (twelve) hours as needed for moderate pain. (Patient taking differently: Take 50 mg by mouth daily as needed for moderate pain. ) 30 tablet    No current facility-administered medications on file prior to visit.     Review of Systems  Constitution: Positive for malaise/fatigue (improving). Negative for decreased appetite, weight gain and weight loss.  Eyes: Negative for visual disturbance.  Cardiovascular: Positive for leg swelling (improved). Negative for chest pain, claudication, dyspnea on exertion, orthopnea, palpitations and syncope.  Respiratory: Positive for shortness of breath. Negative for hemoptysis and wheezing.   Endocrine: Negative for cold intolerance and heat intolerance.  Hematologic/Lymphatic: Negative for bleeding problem. Does not bruise/bleed easily.  Skin: Negative for nail changes.  Musculoskeletal: Positive for back pain, joint pain and muscle weakness (improved). Negative for myalgias.  Gastrointestinal: Negative for abdominal pain, change in bowel habit, nausea and vomiting.  Neurological: Positive for dizziness. Negative for difficulty with concentration, focal weakness and headaches.  Psychiatric/Behavioral: Negative for altered mental status and suicidal ideas.  All other systems reviewed and are negative.     Objective  Blood pressure (!) 111/59, pulse 88, height 5' 10"  (1.778 m), weight 298 lb (135.2 kg). Body mass index is 42.76 kg/m.   Physical exam not performed or limited due to virtual visit. Limited exam revealed neck veins to be flat, respiration was regular and not labored, leg edema has completely resolved, he does have chronic dermatitis changes from long-standing leg edema and venostasis.  Please see exam  details from prior visit is as below.     Physical Exam  Constitutional: He appears well-developed. No distress.  Morbidly obese  HENT:  Head: Atraumatic.  Eyes: Conjunctivae are normal.  Neck: No thyromegaly present.  Short neck and difficult to make out JVD  Cardiovascular: Normal rate, regular rhythm and intact distal pulses. Exam reveals distant heart sounds. Exam reveals no gallop.  Murmur heard.  Harsh midsystolic murmur is present with a grade of 3/6 at the upper right sternal border. Femoral and popliteal pulse difficult to feel due to patient's bodily habitus. Pulses:      Carotid pulses are 2+ on the right side and 2+ on the left side.      Radial pulses are 2+ on the right side and 2+ on the left side.       Dorsalis pedis pulses are 0 on the right side and 0 on the left side.       Posterior tibial pulses are 1+ on the right side and 1+ on the left side.  Pulmonary/Chest: Effort normal and breath sounds normal.  Abdominal: Soft. Bowel sounds are normal.  Large pannus and obese  Musculoskeletal: Normal range of motion.        General: Edema (2 plus pitting. Chronic dermitis change noted) present.  Neurological: He is alert.  Skin: Skin is warm and dry.  Psychiatric: He has a normal mood and affect.   Radiology: No results found.  Laboratory examination:    CMP Latest Ref Rng & Units 04/17/2018 04/17/2018 04/16/2018  Glucose 70 - 99 mg/dL - 136(H) 115(H)  BUN 8 - 23 mg/dL - 15 16  Creatinine 0.61 - 1.24 mg/dL 1.09 1.08 1.28(H)  Sodium 135 - 145 mmol/L - 137 138  Potassium 3.5 - 5.1 mmol/L - 3.5 3.7  Chloride 98 - 111 mmol/L - 103 102  CO2 22 - 32 mmol/L - 27 25  Calcium 8.9 - 10.3 mg/dL - 8.2(L) 8.4(L)  Total Protein 6.5 - 8.1 g/dL - - -  Total Bilirubin 0.3 - 1.2 mg/dL - - -  Alkaline Phos 38 - 126 U/L - - -  AST 15 - 41 U/L - - -  ALT 0 - 44 U/L - - -   CBC Latest Ref Rng & Units 04/17/2018 04/16/2018 04/15/2018  WBC 4.0 - 10.5 K/uL 3.1(L) 4.2 2.7(L)  Hemoglobin  13.0 - 17.0 g/dL 11.8(L) 11.5(L) 10.8(L)  Hematocrit 39.0 - 52.0 % 36.3(L) 35.9(L) 32.9(L)  Platelets 150 - 400 K/uL 74(L) 95(L) 73(L)   Lipid Panel     Component Value Date/Time   CHOL 149 11/23/2017 1404   TRIG 79 11/23/2017 1404   HDL 37 (L) 11/23/2017 1404   CHOLHDL 4.0 11/23/2017 1404   VLDL 16 11/23/2017 1404   LDLCALC 96 11/23/2017 1404   HEMOGLOBIN A1C Lab Results  Component Value Date   HGBA1C 6.0 (H) 04/15/2018   MPG 125.5 04/15/2018   TSH Recent Labs    11/23/17 1404  TSH 1.943    Cardiac Studies:   Right and left heart catheterization 04/17/2018: RA 13/12, mean 11 mmHg.; RV 50/12, EDP 5 mmHg.; PA 49/13, mean 35 mmHg, PA saturation 61%.; PW 28/39, mean 28 mmHg. Aortic saturation was 95%. CO 6.34, CI 2.53. QP/QS 1.00. Aortic peak to peak gradient 30 mmHg, mean 25.1 mmHg, valve area 1.10 cm.  Left main: Mildly calcified. No significant disease. LAD: Mild to moderately calcified. Ostial 50 to 60% stenosis. DFR 0.95, FFR 0.89, not hemodynamically significant. Mild diffuse disease throughout the LAD. Large D1. RI: Mild diffuse disease. CX: Mild calcification, no significant disease. RCA: Very large and ectatic, moderately calcified diffusely. Slow flow is evident.  No high-grade stenosis is evident. Large PL and PDA branches.  Impression: No significant coronary artery disease, moderate disease in the ostial LAD. Moderate aortic stenosis. Moderate pulmonary hypertension with elevated moderate LVEDP.  Recommendation: Patient will be diuresed today, he will potentially be discharged home in the morning, TAVR team is evaluating the patient for possible TAVR.  TEE 04/16/2018: LVEF 35 to 40% % with global hypokinesis. Trileaflet aortic valve with severe calcific degeneration. Severe aortic stenosis, calculated aortic valve area 0.99 cm. Planimetry valve area 0.7 cm. Mild MR and TR. Mild to moderate aortic atherosclerosis. Peak PG of 37 and mean  PG is 21 mm Hg.  EKG 04/15/2018: Sinus rhythm with first-degree AV block, ventricularly paced rhythm. No further analysis.  Assessment   Severe aortic stenosis  Chronic combined systolic and diastolic heart failure (HCC)  Secondary esophageal varices without bleeding (HCC)  Cirrhosis of liver with ascites, unspecified hepatic cirrhosis type ([place])  Bilateral leg edema  EKG 04/15/2018: Sinus rhythm with first-degree AV block, ventricularly paced rhythm. No further analysis.  Recommendations:   Patient needs further GI workup including banding of very large and multiple varices, Underwent successful  sclerotherapy and banding on 07/21/2018 and has been scheduled for repeat procedure at [facility].  His leg edema is essentially resolved, breathing is improved and he has started to ambulate in the house.  He looks well and does not appear to be in acute decompensated heart failure.  He has not had any syncope or dizziness.  Hence in view of his underlying multiple medical comorbidity, I have recommended that we should complete GI evaluation prior to proceeding with TAVR as it will increase the risk of GI bleeding if he is on dual antiplatelet therapy or if he were to receive intravenous heparin.  Weight loss was again encouraged.  His weight has remained stable.  I would like to see him back in the office in 2 months.  In view of Covid 19, this is a virtual visit.  Wife present in all questions answered.  She would like [place] to take over the care of the pacemaker, will request due to obesity to release the pacemaker.  Adrian Prows, MD, [facility] 08/06/2018, 10:49 AM [facility]. [place] Pager: [phone] Office: [phone] If no answer Cell [phone]

## 2018-08-06 ENCOUNTER — Other Ambulatory Visit: Payer: Self-pay

## 2018-08-06 ENCOUNTER — Ambulatory Visit (INDEPENDENT_AMBULATORY_CARE_PROVIDER_SITE_OTHER): Payer: Medicare Other | Admitting: Cardiology

## 2018-08-06 ENCOUNTER — Encounter: Payer: Self-pay | Admitting: Cardiology

## 2018-08-06 VITALS — BP 111/59 | HR 88 | Ht 70.0 in | Wt 298.0 lb

## 2018-08-06 DIAGNOSIS — I8510 Secondary esophageal varices without bleeding: Secondary | ICD-10-CM

## 2018-08-06 DIAGNOSIS — I350 Nonrheumatic aortic (valve) stenosis: Secondary | ICD-10-CM | POA: Diagnosis not present

## 2018-08-06 DIAGNOSIS — I5042 Chronic combined systolic (congestive) and diastolic (congestive) heart failure: Secondary | ICD-10-CM | POA: Diagnosis not present

## 2018-08-06 DIAGNOSIS — K7460 Unspecified cirrhosis of liver: Secondary | ICD-10-CM

## 2018-08-06 DIAGNOSIS — R600 Localized edema: Secondary | ICD-10-CM

## 2018-08-06 DIAGNOSIS — R188 Other ascites: Secondary | ICD-10-CM

## 2018-08-15 DIAGNOSIS — Z1159 Encounter for screening for other viral diseases: Secondary | ICD-10-CM | POA: Diagnosis not present

## 2018-08-15 DIAGNOSIS — K7460 Unspecified cirrhosis of liver: Secondary | ICD-10-CM | POA: Diagnosis not present

## 2018-08-15 DIAGNOSIS — K7581 Nonalcoholic steatohepatitis (NASH): Secondary | ICD-10-CM | POA: Diagnosis not present

## 2018-08-15 DIAGNOSIS — Z01818 Encounter for other preprocedural examination: Secondary | ICD-10-CM | POA: Diagnosis not present

## 2018-08-18 DIAGNOSIS — K219 Gastro-esophageal reflux disease without esophagitis: Secondary | ICD-10-CM | POA: Diagnosis not present

## 2018-08-18 DIAGNOSIS — Z8673 Personal history of transient ischemic attack (TIA), and cerebral infarction without residual deficits: Secondary | ICD-10-CM | POA: Diagnosis not present

## 2018-08-18 DIAGNOSIS — K3189 Other diseases of stomach and duodenum: Secondary | ICD-10-CM | POA: Diagnosis not present

## 2018-08-18 DIAGNOSIS — I8510 Secondary esophageal varices without bleeding: Secondary | ICD-10-CM | POA: Diagnosis not present

## 2018-08-18 DIAGNOSIS — E1140 Type 2 diabetes mellitus with diabetic neuropathy, unspecified: Secondary | ICD-10-CM | POA: Diagnosis not present

## 2018-08-18 DIAGNOSIS — Z8505 Personal history of malignant neoplasm of liver: Secondary | ICD-10-CM | POA: Diagnosis not present

## 2018-08-18 DIAGNOSIS — Z794 Long term (current) use of insulin: Secondary | ICD-10-CM | POA: Diagnosis not present

## 2018-08-18 DIAGNOSIS — K7581 Nonalcoholic steatohepatitis (NASH): Secondary | ICD-10-CM | POA: Diagnosis not present

## 2018-08-18 DIAGNOSIS — E785 Hyperlipidemia, unspecified: Secondary | ICD-10-CM | POA: Diagnosis not present

## 2018-08-18 DIAGNOSIS — I350 Nonrheumatic aortic (valve) stenosis: Secondary | ICD-10-CM | POA: Diagnosis not present

## 2018-08-18 DIAGNOSIS — C8290 Follicular lymphoma, unspecified, unspecified site: Secondary | ICD-10-CM | POA: Diagnosis not present

## 2018-08-18 DIAGNOSIS — I5032 Chronic diastolic (congestive) heart failure: Secondary | ICD-10-CM | POA: Diagnosis not present

## 2018-08-18 DIAGNOSIS — K766 Portal hypertension: Secondary | ICD-10-CM | POA: Diagnosis not present

## 2018-08-18 DIAGNOSIS — G4733 Obstructive sleep apnea (adult) (pediatric): Secondary | ICD-10-CM | POA: Diagnosis not present

## 2018-08-18 DIAGNOSIS — Z6841 Body Mass Index (BMI) 40.0 and over, adult: Secondary | ICD-10-CM | POA: Diagnosis not present

## 2018-08-18 DIAGNOSIS — Z950 Presence of cardiac pacemaker: Secondary | ICD-10-CM | POA: Diagnosis not present

## 2018-08-18 DIAGNOSIS — I8500 Esophageal varices without bleeding: Secondary | ICD-10-CM | POA: Diagnosis not present

## 2018-08-25 NOTE — Telephone Encounter (Signed)
From pt

## 2018-08-26 NOTE — Telephone Encounter (Signed)
Working on it.

## 2018-08-31 ENCOUNTER — Emergency Department (HOSPITAL_COMMUNITY): Payer: Medicare Other

## 2018-08-31 ENCOUNTER — Emergency Department (HOSPITAL_COMMUNITY)
Admission: EM | Admit: 2018-08-31 | Discharge: 2018-08-31 | Disposition: A | Discharge status: Home / Self Care | Payer: Medicare Other | Attending: Emergency Medicine | Admitting: Emergency Medicine

## 2018-08-31 ENCOUNTER — Encounter (HOSPITAL_COMMUNITY): Payer: Self-pay

## 2018-08-31 ENCOUNTER — Other Ambulatory Visit: Payer: Self-pay

## 2018-08-31 DIAGNOSIS — W01190A Fall on same level from slipping, tripping and stumbling with subsequent striking against furniture, initial encounter: Secondary | ICD-10-CM | POA: Diagnosis not present

## 2018-08-31 DIAGNOSIS — Y999 Unspecified external cause status: Secondary | ICD-10-CM | POA: Diagnosis not present

## 2018-08-31 DIAGNOSIS — Z8505 Personal history of malignant neoplasm of liver: Secondary | ICD-10-CM | POA: Diagnosis not present

## 2018-08-31 DIAGNOSIS — I2510 Atherosclerotic heart disease of native coronary artery without angina pectoris: Secondary | ICD-10-CM | POA: Diagnosis not present

## 2018-08-31 DIAGNOSIS — Z8673 Personal history of transient ischemic attack (TIA), and cerebral infarction without residual deficits: Secondary | ICD-10-CM | POA: Insufficient documentation

## 2018-08-31 DIAGNOSIS — I10 Essential (primary) hypertension: Secondary | ICD-10-CM | POA: Insufficient documentation

## 2018-08-31 DIAGNOSIS — Z1159 Encounter for screening for other viral diseases: Secondary | ICD-10-CM | POA: Diagnosis not present

## 2018-08-31 DIAGNOSIS — Z794 Long term (current) use of insulin: Secondary | ICD-10-CM | POA: Diagnosis not present

## 2018-08-31 DIAGNOSIS — Z79899 Other long term (current) drug therapy: Secondary | ICD-10-CM | POA: Insufficient documentation

## 2018-08-31 DIAGNOSIS — S8001XA Contusion of right knee, initial encounter: Secondary | ICD-10-CM | POA: Diagnosis not present

## 2018-08-31 DIAGNOSIS — Z20828 Contact with and (suspected) exposure to other viral communicable diseases: Secondary | ICD-10-CM | POA: Diagnosis not present

## 2018-08-31 DIAGNOSIS — Z87891 Personal history of nicotine dependence: Secondary | ICD-10-CM | POA: Insufficient documentation

## 2018-08-31 DIAGNOSIS — M6281 Muscle weakness (generalized): Secondary | ICD-10-CM | POA: Insufficient documentation

## 2018-08-31 DIAGNOSIS — W19XXXA Unspecified fall, initial encounter: Secondary | ICD-10-CM

## 2018-08-31 DIAGNOSIS — Z7982 Long term (current) use of aspirin: Secondary | ICD-10-CM | POA: Insufficient documentation

## 2018-08-31 DIAGNOSIS — Y92003 Bedroom of unspecified non-institutional (private) residence as the place of occurrence of the external cause: Secondary | ICD-10-CM | POA: Diagnosis not present

## 2018-08-31 DIAGNOSIS — S0990XA Unspecified injury of head, initial encounter: Secondary | ICD-10-CM | POA: Insufficient documentation

## 2018-08-31 DIAGNOSIS — Y939 Activity, unspecified: Secondary | ICD-10-CM | POA: Diagnosis not present

## 2018-08-31 DIAGNOSIS — E119 Type 2 diabetes mellitus without complications: Secondary | ICD-10-CM | POA: Diagnosis not present

## 2018-08-31 DIAGNOSIS — R531 Weakness: Secondary | ICD-10-CM | POA: Diagnosis not present

## 2018-08-31 LAB — COMPREHENSIVE METABOLIC PANEL
ALT: 16 U/L (ref 0–44)
AST: 36 U/L (ref 15–41)
Albumin: 2.8 g/dL — ABNORMAL LOW (ref 3.5–5.0)
Alkaline Phosphatase: 90 U/L (ref 38–126)
Anion gap: 11 (ref 5–15)
BUN: 15 mg/dL (ref 8–23)
CO2: 25 mmol/L (ref 22–32)
Calcium: 8.3 mg/dL — ABNORMAL LOW (ref 8.9–10.3)
Chloride: 106 mmol/L (ref 98–111)
Creatinine, Ser: 0.96 mg/dL (ref 0.61–1.24)
GFR calc Af Amer: >60 mL/min (ref >60)
GFR calc non Af Amer: >60 mL/min (ref >60)
Glucose, Bld: 86 mg/dL (ref 70–99)
Potassium: 3.9 mmol/L (ref 3.5–5.1)
Sodium: 142 mmol/L (ref 135–145)
Total Bilirubin: 1.4 mg/dL — ABNORMAL HIGH (ref 0.3–1.2)
Total Protein: 6.4 g/dL — ABNORMAL LOW (ref 6.5–8.1)

## 2018-08-31 LAB — CBC WITH DIFFERENTIAL/PLATELET
Abs Immature Granulocytes: 0.01 10*3/uL (ref 0.00–0.07)
Basophils Absolute: 0 10*3/uL (ref 0.0–0.1)
Basophils Relative: 0 %
Eosinophils Absolute: 0 10*3/uL (ref 0.0–0.5)
Eosinophils Relative: 1 %
HCT: 34.3 % — ABNORMAL LOW (ref 39.0–52.0)
Hemoglobin: 10.6 g/dL — ABNORMAL LOW (ref 13.0–17.0)
Immature Granulocytes: 0 %
Lymphocytes Relative: 14 %
Lymphs Abs: 0.5 10*3/uL — ABNORMAL LOW (ref 0.7–4.0)
MCH: 31.5 pg (ref 26.0–34.0)
MCHC: 30.9 g/dL (ref 30.0–36.0)
MCV: 102.1 fL — ABNORMAL HIGH (ref 80.0–100.0)
Monocytes Absolute: 0.3 10*3/uL (ref 0.1–1.0)
Monocytes Relative: 9 %
Neutro Abs: 2.5 10*3/uL (ref 1.7–7.7)
Neutrophils Relative %: 76 %
Platelets: 66 10*3/uL — ABNORMAL LOW (ref 150–400)
RBC: 3.36 MIL/uL — ABNORMAL LOW (ref 4.22–5.81)
RDW: 16 % — ABNORMAL HIGH (ref 11.5–15.5)
WBC: 3.3 10*3/uL — ABNORMAL LOW (ref 4.0–10.5)
nRBC: 0 % (ref 0.0–0.2)

## 2018-08-31 LAB — TROPONIN I: Troponin I: <0.03 ng/mL (ref <0.03)

## 2018-08-31 LAB — URINALYSIS, ROUTINE W REFLEX MICROSCOPIC
Bilirubin Urine: NEGATIVE
Glucose, UA: NEGATIVE mg/dL
Hgb urine dipstick: NEGATIVE
Ketones, ur: NEGATIVE mg/dL
Leukocytes,Ua: NEGATIVE
Nitrite: NEGATIVE
Protein, ur: NEGATIVE mg/dL
Specific Gravity, Urine: 1.014 (ref 1.005–1.030)
pH: 6 (ref 5.0–8.0)

## 2018-08-31 LAB — BRAIN NATRIURETIC PEPTIDE: B Natriuretic Peptide: 313.3 pg/mL — ABNORMAL HIGH (ref 0.0–100.0)

## 2018-08-31 LAB — AMMONIA: Ammonia: 82 umol/L — ABNORMAL HIGH (ref 9–35)

## 2018-08-31 LAB — LIPASE, BLOOD: Lipase: 21 U/L (ref 11–51)

## 2018-08-31 LAB — LACTIC ACID, PLASMA
Lactic Acid, Venous: 2.1 mmol/L (ref 0.5–1.9)
Lactic Acid, Venous: 1 mmol/L (ref 0.5–1.9)

## 2018-08-31 LAB — CBG MONITORING, ED: Glucose-Capillary: 68 mg/dL — ABNORMAL LOW (ref 70–99)

## 2018-08-31 LAB — PROTIME-INR
INR: 1.3 — ABNORMAL HIGH (ref 0.8–1.2)
Prothrombin Time: 15.8 seconds — ABNORMAL HIGH (ref 11.4–15.2)

## 2018-08-31 LAB — SARS CORONAVIRUS 2 BY RT PCR (HOSPITAL ORDER, PERFORMED IN ~~LOC~~ HOSPITAL LAB): SARS Coronavirus 2: NEGATIVE

## 2018-08-31 MED ORDER — SODIUM CHLORIDE 0.9 % IV BOLUS: 500.0000 mL | Freq: Once | INTRAVENOUS | Status: DC

## 2018-08-31 NOTE — Discharge Instructions (Addendum)
You were seen in the emergency department for evaluation of injuries from a fall earlier today.  You had a CAT scan of your head and cervical spine along with x-rays of your chest and right knee along with blood work EKG and Covid testing.  You were able to ambulate here with minimal help.  We discussed staying in the hospital for further evaluation but you felt you wanted to be discharged now.  It will be important for you to follow-up with your doctor and please return if any worsening symptoms.

## 2018-08-31 NOTE — ED Notes (Signed)
Nurse Navigator spoke with wife and ED Doctor regarding plan of care. Discussed with patient will be discharged and patient does not want to talk to wife because she called EMS and he is in the hospital. States he will take a taxi home. Wife will come to hospital and speak with patient in person.

## 2018-08-31 NOTE — ED Notes (Signed)
Critical Lactic Acid: 2.1, EDP aware.

## 2018-08-31 NOTE — ED Triage Notes (Signed)
Pt fell, witnessed by wife, no LOC did hit his head on his dresser. C/o weak last few days, decreased appetite. Upon arrival c-collar in place, A/Ox4.

## 2018-08-31 NOTE — ED Notes (Addendum)
[place]    [phone]

## 2018-08-31 NOTE — ED Notes (Signed)
Gave patient [place] sandwich meal, apple sauce, cheese, and crackers with sprite zero.

## 2018-08-31 NOTE — ED Provider Notes (Signed)
[facility] EMERGENCY DEPARTMENT Provider Note   CSN: [id number] Arrival date & time: 08/31/18  1205    History   Chief Complaint Chief Complaint  Patient presents with  . Fall    HPI Keith [name] is a 78 y.o. male.  He has multiple medical problems including Karlene Lineman, diabetes, stroke, aortic stenosis.  EMS was called out to his house after a witnessed fall.  Apparently his wife saw him fall in the bedroom hitting his head on the dresser.  The patient denies any LOC.  He said he has been weak and sleeping a lot for the last few weeks.  Decreased appetite.  He denies any specific complaints including headache double vision blurry vision neck pain numbness weakness chest pain abdominal pain vomiting or diarrhea.  No numbness or weakness focally.  Denies any fever.  States his been taking his medications regularly.  EMS states that the wife informed that he has been very noncompliant with his medications.     The history is provided by the patient and the EMS personnel.  Fall  This is a new problem. The current episode started 1 to 2 hours ago. The problem has not changed since onset.Pertinent negatives include no chest pain, no abdominal pain, no headaches and no shortness of breath. Nothing aggravates the symptoms. Nothing relieves the symptoms. He has tried nothing for the symptoms. The treatment provided no relief.    Past Medical History:  Diagnosis Date  . Cirrhosis of liver (HCC)    NASH  . Diabetes mellitus   . Hepatic cancer ([place])   . Prostate hypertrophy   . Shortness of breath   . Stroke ([place])   . Varicose veins   . Weakness of both legs     Patient Active Problem List   Diagnosis Date Noted  . Chronic combined systolic and diastolic CHF, NYHA class 1 ([place]) 05/11/2018  . Severe aortic stenosis 04/15/2018  . AV block 04/15/2018  . Acute respiratory distress 04/15/2018  . Chest pressure 04/15/2018  . Acute combined systolic and diastolic  (congestive) hrt fail ([place]) 04/14/2018  . Obesity, Class III, BMI 40-49.9 (morbid obesity) ([place]) 04/14/2018  . Diabetes mellitus type 2 in obese ([place])   . Orthostatic hypotension   . Syncope 12/17/2017  . Postural dizziness with near syncope 12/17/2017  . Near syncope 12/17/2017  . Increased ammonia level 11/30/2017  . Hepatic cancer ([place]) 11/30/2017  . Dementia ([place]) 11/30/2017  . BPH (benign prostatic hyperplasia) 11/30/2017  . Coronary artery disease 11/30/2017  . Weakness 11/23/2017  . Cirrhosis of liver ([place]) 11/23/2017  . OSA (obstructive sleep apnea) 11/23/2017  . Varicose veins of left lower extremity with complications 66/44/0347  . Knee pain, acute 01/26/2013  . Neuropathy 12/22/2012  . Prostate hypertrophy 12/22/2012  . Hyperlipidemia 12/22/2012  . GERD (gastroesophageal reflux disease) 12/17/2012  . Hypertension 12/17/2012  . Thrombocytopenia, unspecified ([place]) 12/17/2012  . Hyponatremia 12/15/2012  . OA (osteoarthritis) of knee 12/14/2012    Past Surgical History:  Procedure Laterality Date  . APPENDECTOMY    . EYE SURGERY     bilateral cataract   . INTRAVASCULAR PRESSURE WIRE/FFR STUDY N/A 04/17/2018   Procedure: INTRAVASCULAR PRESSURE WIRE/FFR STUDY;  Surgeon: Adrian Prows, MD;  Location: [facility];  Service: Cardiovascular;  Laterality: N/A;  . left arm surgery     age 13  . left hand surgery     tendons ripped on left hand  . LIVER RESECTION    .  RIGHT/LEFT HEART CATH AND CORONARY ANGIOGRAPHY N/A 04/17/2018   Procedure: RIGHT/LEFT HEART CATH AND CORONARY ANGIOGRAPHY;  Surgeon: Adrian Prows, MD;  Location: [facility];  Service: Cardiovascular;  Laterality: N/A;  . TEE WITHOUT CARDIOVERSION N/A 04/16/2018   Procedure: TRANSESOPHAGEAL ECHOCARDIOGRAM (TEE);  Surgeon: Adrian Prows, MD;  Location: [facility];  Service: Cardiovascular;  Laterality: N/A;  . TONSILLECTOMY    . TOTAL KNEE ARTHROPLASTY Left 12/14/2012   Procedure: LEFT TOTAL KNEE ARTHROPLASTY;   Surgeon: Gearlean Alf, MD;  Location: [facility];  Service: Orthopedics;  Laterality: Left;        Home Medications    Prior to Admission medications   Medication Sig Start Date End Date Taking? Authorizing Provider  albuterol (PROVENTIL HFA;VENTOLIN HFA) 108 (90 BASE) MCG/ACT inhaler Inhale 2 puffs into the lungs every 4 (four) hours as needed for wheezing or shortness of breath.     [provider]  amitriptyline (ELAVIL) 25 MG tablet Take 25 mg by mouth at bedtime.     [provider]  aspirin EC 81 MG EC tablet Take 1 tablet (81 mg total) by mouth daily. 04/19/18   Mariel Aloe, MD  Cholecalciferol (VITAMIN D3) 50 MCG (2000 UT) TABS Take 2,000 Units by mouth daily.    [provider]  cyanocobalamin (,VITAMIN B-12,) 1000 MCG/ML injection Inject 1,000 mcg into the muscle every 30 (thirty) days.    [provider]  finasteride (PROSCAR) 5 MG tablet Take 5 mg by mouth every evening.     [provider]  folic acid (FOLVITE) 1 MG tablet Take 1 mg by mouth daily. 02/13/09   [provider]  furosemide (LASIX) 20 MG tablet Take 20 mg by mouth daily.    [provider]  hydrocerin (EUCERIN) CREA Apply 1 application topically daily as needed (dry skin).     [provider]  insulin aspart (NOVOLOG) 100 UNIT/ML injection Inject 5-30 Units into the skin See admin instructions. Inject 5-30 units into the skin three times a day before meals, per sliding scale    [provider]  insulin glargine (LANTUS) 100 UNIT/ML injection Inject 42 Units into the skin 2 (two) times daily. Inject 40 units into the skin 2 times a day- 10 AM and bedtime    [provider]  memantine (NAMENDA) 10 MG tablet Take 20 mg by mouth at bedtime.    [provider]  pantoprazole (PROTONIX) 40 MG tablet Take 40 mg by mouth daily.    [provider]  polyvinyl alcohol (LIQUIFILM TEARS) 1.4 % ophthalmic solution Place 1  drop into both eyes 2 (two) times daily as needed (dry eyes).    [provider]  pregabalin (LYRICA) 300 MG capsule Take 300 mg by mouth 2 (two) times daily.    [provider]  rifaximin (XIFAXAN) 550 MG TABS tablet Take 550 mg by mouth 2 (two) times daily.    [provider]  simvastatin (ZOCOR) 20 MG tablet Take 10 mg by mouth every evening.    [provider]  spironolactone (ALDACTONE) 25 MG tablet Take 2 tablets (50 mg total) by mouth daily. 06/05/18 09/03/18  Adrian Prows, MD  tamsulosin (FLOMAX) 0.4 MG CAPS Take 0.4 mg by mouth every evening.     [provider]  traMADol (ULTRAM) 50 MG tablet Take 1 tablet (50 mg total) by mouth every 12 (twelve) hours as needed for moderate pain. Patient taking differently: Take 50 mg by mouth daily  as needed for moderate pain.  12/19/17   Geradine Girt, DO    Family History Family History  Problem Relation Age of Onset  . Stroke Father   . Heart failure Brother 42       Identical twin  . Aortic stenosis Mother     Social History Social History   Tobacco Use  . Smoking status: Former Smoker    Packs/day: 3.00    Years: 15.00    Pack years: 45.00    Types: Cigarettes    Last attempt to quit: 1970    Years since quitting: 50.4  . Smokeless tobacco: Never Used  Substance Use Topics  . Alcohol use: No    Alcohol/week: 0.0 standard drinks  . Drug use: No     Allergies   Contrast media [iodinated diagnostic agents]; Fentanyl; Gadolinium; Iodine; Merbromin; and Other   Review of Systems Review of Systems  Constitutional: Positive for activity change, appetite change and fatigue. Negative for fever.  HENT: Negative for sore throat.   Eyes: Negative for visual disturbance.  Respiratory: Negative for shortness of breath.   Cardiovascular: Negative for chest pain.  Gastrointestinal: Negative for abdominal pain, nausea and vomiting.  Genitourinary: Negative for dysuria.  Musculoskeletal:  Negative for neck pain.  Skin: Negative for rash.  Neurological: Negative for headaches.     Physical Exam Updated Vital Signs BP 112/79   Resp 19   Ht 5' 9.5" (1.765 m)   Wt 136.1 kg   BMI 43.67 kg/m   Physical Exam Vitals signs and nursing note reviewed.  Constitutional:      Appearance: He is well-developed. He is obese.  HENT:     Head: Normocephalic and atraumatic.  Eyes:     Conjunctiva/sclera: Conjunctivae normal.  Neck:     Musculoskeletal: Neck supple.  Cardiovascular:     Rate and Rhythm: Normal rate and regular rhythm.     Heart sounds: Murmur present.  Pulmonary:     Effort: Pulmonary effort is normal. No respiratory distress.     Breath sounds: Normal breath sounds.  Abdominal:     General: There is distension.     Palpations: Abdomen is soft.     Tenderness: There is no abdominal tenderness. There is no guarding or rebound.  Musculoskeletal: Normal range of motion.        General: Tenderness (right knee, small abrasion anterior) and signs of injury present.     Right lower leg: Edema present.     Left lower leg: Edema present.  Skin:    General: Skin is warm and dry.     Capillary Refill: Capillary refill takes less than 2 seconds.  Neurological:     General: No focal deficit present.     Mental Status: He is alert and oriented to person, place, and time.      ED Treatments / Results  Labs (all labs ordered are listed, but only abnormal results are displayed) Labs Reviewed  COMPREHENSIVE METABOLIC PANEL - Abnormal; Notable for the following components:      Result Value   Calcium 8.3 (*)    Total Protein 6.4 (*)    Albumin 2.8 (*)    Total Bilirubin 1.4 (*)    All other components within normal limits  BRAIN NATRIURETIC PEPTIDE - Abnormal; Notable for the following components:   B Natriuretic Peptide 313.3 (*)    All other components within normal limits  CBC WITH DIFFERENTIAL/PLATELET - Abnormal; Notable for the following components:  WBC 3.3 (*)    RBC 3.36 (*)    Hemoglobin 10.6 (*)    HCT 34.3 (*)    MCV 102.1 (*)    RDW 16.0 (*)    Platelets 66 (*)    Lymphs Abs 0.5 (*)    All other components within normal limits  PROTIME-INR - Abnormal; Notable for the following components:   Prothrombin Time 15.8 (*)    INR 1.3 (*)    All other components within normal limits  LACTIC ACID, PLASMA - Abnormal; Notable for the following components:   Lactic Acid, Venous 2.1 (*)    All other components within normal limits  AMMONIA - Abnormal; Notable for the following components:   Ammonia 82 (*)    All other components within normal limits  CBG MONITORING, ED - Abnormal; Notable for the following components:   Glucose-Capillary 68 (*)    All other components within normal limits  SARS CORONAVIRUS 2 (HOSPITAL ORDER, [facility])  TROPONIN I  LIPASE, BLOOD  URINALYSIS, ROUTINE W REFLEX MICROSCOPIC  LACTIC ACID, PLASMA    EKG EKG Interpretation  Date/Time:  Monday Aug 31 2018 12:16:23 EDT Ventricular Rate:  94 PR Interval:    QRS Duration: 130 QT Interval:  402 QTC Calculation: 503 R Axis:   81 Text Interpretation:  Sinus rhythm Short PR interval Nonspecific intraventricular conduction delay Anteroseptal infarct, old Borderline repolarization abnormality new t wave inversions compared with prior 1/20 Confirmed by Aletta Edouard [phone]) on 08/31/2018 12:59:51 PM   Radiology Dg Chest 1 View  Result Date: 08/31/2018 CLINICAL DATA:  Fall. Weakness. EXAM: CHEST  1 VIEW COMPARISON:  Chest radiographs 04/14/2018 and CTA 04/22/2018 FINDINGS: A dual lead pacemaker remains in place. The cardiac silhouette remains enlarged. Aortic atherosclerosis is noted. There is increased pulmonary vascular congestion with increased interstitial markings diffusely. Asymmetric opacity is noted in the right perihilar and infrahilar regions. No sizable pleural effusion or pneumothorax is identified. No acute osseous  abnormality is seen. IMPRESSION: 1. Cardiomegaly with increased pulmonary vascular congestion and interstitial densities compatible with edema. 2. Right perihilar/infrahilar opacity which may reflect asymmetric edema or pneumonia. Electronically Signed   By: Logan Bores M.D.   On: 08/31/2018 13:02   Ct Head Wo Contrast  Result Date: 08/31/2018 CLINICAL DATA:  78 year old male with a history of occipital headache EXAM: CT HEAD WITHOUT CONTRAST TECHNIQUE: Contiguous axial images were obtained from the base of the skull through the vertex without intravenous contrast. COMPARISON:  None. FINDINGS: Brain: No acute intracranial hemorrhage. No midline shift or mass effect. Gray-white differentiation maintained. Senescent volume loss. Patchy hypodensity in the periventricular white matter. Unremarkable appearance of the ventricular system. Vascular: Intracranial atherosclerosis Skull: No acute fracture.  No aggressive bone lesion identified. Sinuses/Orbits: Unremarkable appearance of the orbits. Mastoid air cells clear. No middle ear effusion. No significant sinus disease. Other: Nasal septal defect. IMPRESSION: Negative for acute intracranial abnormality. Senescent volume loss and periventricular white matter disease with associated intracranial atherosclerosis. Septal nasal defect, can be seen with the setting of longstanding/chronic use of vaso constrictive substances. Electronically Signed   By: Corrie Mckusick D.O.   On: 08/31/2018 14:04   Ct Cervical Spine Wo Contrast  Result Date: 08/31/2018 CLINICAL DATA:  Midline occipital headache. Fall without loss of consciousness. Recent weakness. EXAM: CT CERVICAL SPINE WITHOUT CONTRAST TECHNIQUE: Multidetector CT imaging of the cervical spine was performed without intravenous contrast. Multiplanar CT image reconstructions were also generated. COMPARISON:  Cervical spine CT  07/09/2012. FINDINGS: Alignment: Straightening without focal angulation or listhesis. Skull base  and vertebrae: No evidence of acute cervical spine fracture or traumatic subluxation. Soft tissues and spinal canal: No prevertebral fluid or swelling. No visible canal hematoma. Disc levels: There is no large disc herniation or significant spinal stenosis. The disc spaces are largely preserved. There is minimal uncinate spurring and facet hypertrophy. Upper chest: Small bilateral pleural effusions are noted. Patient has a pacemaker and emphysematous changes within the visualized lungs. Other: Bilateral carotid atherosclerosis. IMPRESSION: 1. No evidence of acute cervical spine fracture, traumatic subluxation or static signs of instability. 2. Carotid atherosclerosis, emphysema and bilateral pleural effusions noted. Electronically Signed   By: Richardean Sale M.D.   On: 08/31/2018 14:03   Dg Knee Complete 4 Views Right  Result Date: 08/31/2018 CLINICAL DATA:  Fall, knee pain. EXAM: RIGHT KNEE - COMPLETE 4+ VIEW COMPARISON:  None. FINDINGS: Osseous alignment is normal. No fracture line or displaced fracture fragment seen. No significant degenerative change. Questionable small joint effusion within the suprapatellar bursa. Prominent atherosclerosis of the femoral, popliteal and calf arteries. IMPRESSION: 1. No osseous fracture or dislocation. 2. Questionable small joint effusion. 3. Atherosclerosis. Electronically Signed   By: Franki Cabot M.D.   On: 08/31/2018 12:58    Procedures Procedures (including critical care time)  Medications Ordered in ED Medications - No data to display   Initial Impression / Assessment and Plan / ED Course  I have reviewed the triage vital signs and the nursing notes.  Pertinent labs & imaging results that were available during my care of the patient were reviewed by me and considered in my medical decision making (see chart for details).  Clinical Course as of Sep 01 1135  Mon Aug 31, 2538  7528 78 year old male not on anticoagulation here after mechanical fall  striking his head.  Frontal diagnosis includes intercerebral bleed, cervical fracture, metabolic derangement, liver disease, sepsis, Covid, dehydration   [MB]  1446 [name] wife said he has been progressively weaker and is fallen both a few days ago and again today.  He has not been eating much and she is been having to deal with his low blood sugars.  Normally he independently can get out of bed and uses walker to get around the house but she has been having to do all the transfers now.  She feels she is not able to handle him   [MB]  1552 Patient's repeat lactate is cleared.  Attempted to ambulate him to see how well he is doing make a decision whether he needs to be admitted to the hospital or not.  His ammonia is slightly higher than his baseline at 82 from his baseline in the 60s.   [MB]  1623 Patient was able to ambulate with some minor assistance to the bathroom and back into bed.  I reviewed with him his wife's concern that he is not doing well at home and having difficulty getting around.  He is adamant that he wishes to return back to his house.  I asked if he would call her and he refused.  He said he would take a taxi home.  We have updated the wife of this and she is driving in here to meet with [place].   [MB]    Clinical Course User Index [MB] Hayden Rasmussen, MD   Keith [name] was evaluated in Emergency Department on 08/31/2018 for the symptoms described in the history of present illness. He was evaluated  in the context of the global COVID-19 pandemic, which necessitated consideration that the patient might be at risk for infection with the SARS-CoV-2 virus that causes COVID-19. Institutional protocols and algorithms that pertain to the evaluation of patients at risk for COVID-19 are in a state of rapid change based on information released by regulatory bodies including the CDC and federal and state organizations. These policies and algorithms were followed during the patient's  care in the ED.      Final Clinical Impressions(s) / ED Diagnoses   Final diagnoses:  Fall, initial encounter  Injury of head, initial encounter  Contusion of right knee, initial encounter  Weakness generalized    ED Discharge Orders    None       Hayden Rasmussen, MD 09/01/18 [phone]

## 2018-08-31 NOTE — ED Notes (Signed)
Will check blood sugar prior to wife arrival approximately 1700.

## 2018-08-31 NOTE — ED Notes (Signed)
Nurse navigator spoke with patient and wife regarding plan of care. Will call back after ambulating patient.

## 2018-08-31 NOTE — ED Notes (Signed)
Nurse navigator spoke with wife at bedside. Patient compliant and ready to be discharged with wife. Patient ate drank soda without incident.

## 2018-09-08 DIAGNOSIS — I350 Nonrheumatic aortic (valve) stenosis: Secondary | ICD-10-CM | POA: Diagnosis not present

## 2018-09-08 DIAGNOSIS — I5042 Chronic combined systolic (congestive) and diastolic (congestive) heart failure: Secondary | ICD-10-CM | POA: Diagnosis not present

## 2018-09-08 DIAGNOSIS — K7460 Unspecified cirrhosis of liver: Secondary | ICD-10-CM | POA: Diagnosis not present

## 2018-09-08 DIAGNOSIS — I8510 Secondary esophageal varices without bleeding: Secondary | ICD-10-CM | POA: Diagnosis not present

## 2018-09-08 NOTE — Telephone Encounter (Signed)
Forwarded message

## 2018-09-09 DIAGNOSIS — M6281 Muscle weakness (generalized): Secondary | ICD-10-CM | POA: Diagnosis not present

## 2018-09-09 DIAGNOSIS — R262 Difficulty in walking, not elsewhere classified: Secondary | ICD-10-CM | POA: Diagnosis not present

## 2018-09-09 DIAGNOSIS — K7581 Nonalcoholic steatohepatitis (NASH): Secondary | ICD-10-CM | POA: Diagnosis not present

## 2018-09-09 DIAGNOSIS — R2689 Other abnormalities of gait and mobility: Secondary | ICD-10-CM | POA: Diagnosis not present

## 2018-09-09 DIAGNOSIS — K7460 Unspecified cirrhosis of liver: Secondary | ICD-10-CM | POA: Diagnosis not present

## 2018-09-10 DIAGNOSIS — R4182 Altered mental status, unspecified: Secondary | ICD-10-CM | POA: Diagnosis not present

## 2018-09-10 DIAGNOSIS — K766 Portal hypertension: Secondary | ICD-10-CM | POA: Diagnosis not present

## 2018-09-11 ENCOUNTER — Other Ambulatory Visit: Payer: Self-pay

## 2018-09-11 ENCOUNTER — Encounter (HOSPITAL_COMMUNITY): Payer: Self-pay

## 2018-09-11 ENCOUNTER — Observation Stay (HOSPITAL_COMMUNITY)
Admission: EM | Admit: 2018-09-11 | Discharge: 2018-09-12 | Disposition: A | Discharge status: Home / Self Care | Payer: No Typology Code available for payment source | Attending: Internal Medicine | Admitting: Internal Medicine

## 2018-09-11 ENCOUNTER — Emergency Department (HOSPITAL_COMMUNITY): Payer: No Typology Code available for payment source

## 2018-09-11 DIAGNOSIS — Z03818 Encounter for observation for suspected exposure to other biological agents ruled out: Secondary | ICD-10-CM | POA: Diagnosis not present

## 2018-09-11 DIAGNOSIS — R4182 Altered mental status, unspecified: Secondary | ICD-10-CM

## 2018-09-11 DIAGNOSIS — E785 Hyperlipidemia, unspecified: Secondary | ICD-10-CM | POA: Diagnosis not present

## 2018-09-11 DIAGNOSIS — I10 Essential (primary) hypertension: Secondary | ICD-10-CM | POA: Diagnosis present

## 2018-09-11 DIAGNOSIS — K7460 Unspecified cirrhosis of liver: Secondary | ICD-10-CM | POA: Diagnosis present

## 2018-09-11 DIAGNOSIS — Z87891 Personal history of nicotine dependence: Secondary | ICD-10-CM | POA: Diagnosis not present

## 2018-09-11 DIAGNOSIS — I110 Hypertensive heart disease with heart failure: Secondary | ICD-10-CM | POA: Diagnosis not present

## 2018-09-11 DIAGNOSIS — Z8505 Personal history of malignant neoplasm of liver: Secondary | ICD-10-CM | POA: Diagnosis not present

## 2018-09-11 DIAGNOSIS — Z79899 Other long term (current) drug therapy: Secondary | ICD-10-CM | POA: Insufficient documentation

## 2018-09-11 DIAGNOSIS — Z96652 Presence of left artificial knee joint: Secondary | ICD-10-CM | POA: Diagnosis not present

## 2018-09-11 DIAGNOSIS — K7469 Other cirrhosis of liver: Secondary | ICD-10-CM | POA: Diagnosis not present

## 2018-09-11 DIAGNOSIS — K7290 Hepatic failure, unspecified without coma: Secondary | ICD-10-CM | POA: Diagnosis present

## 2018-09-11 DIAGNOSIS — Z20828 Contact with and (suspected) exposure to other viral communicable diseases: Secondary | ICD-10-CM | POA: Insufficient documentation

## 2018-09-11 DIAGNOSIS — E1165 Type 2 diabetes mellitus with hyperglycemia: Secondary | ICD-10-CM | POA: Diagnosis not present

## 2018-09-11 DIAGNOSIS — Z7982 Long term (current) use of aspirin: Secondary | ICD-10-CM | POA: Insufficient documentation

## 2018-09-11 DIAGNOSIS — R443 Hallucinations, unspecified: Secondary | ICD-10-CM

## 2018-09-11 DIAGNOSIS — G9341 Metabolic encephalopathy: Secondary | ICD-10-CM | POA: Diagnosis not present

## 2018-09-11 DIAGNOSIS — K7682 Hepatic encephalopathy: Secondary | ICD-10-CM | POA: Diagnosis present

## 2018-09-11 DIAGNOSIS — I5041 Acute combined systolic (congestive) and diastolic (congestive) heart failure: Secondary | ICD-10-CM | POA: Diagnosis not present

## 2018-09-11 DIAGNOSIS — E669 Obesity, unspecified: Secondary | ICD-10-CM | POA: Diagnosis not present

## 2018-09-11 DIAGNOSIS — E1169 Type 2 diabetes mellitus with other specified complication: Secondary | ICD-10-CM | POA: Diagnosis not present

## 2018-09-11 DIAGNOSIS — F0390 Unspecified dementia without behavioral disturbance: Secondary | ICD-10-CM

## 2018-09-11 DIAGNOSIS — D61811 Other drug-induced pancytopenia: Secondary | ICD-10-CM | POA: Insufficient documentation

## 2018-09-11 DIAGNOSIS — D696 Thrombocytopenia, unspecified: Secondary | ICD-10-CM

## 2018-09-11 DIAGNOSIS — R410 Disorientation, unspecified: Secondary | ICD-10-CM | POA: Diagnosis not present

## 2018-09-11 DIAGNOSIS — I5043 Acute on chronic combined systolic (congestive) and diastolic (congestive) heart failure: Secondary | ICD-10-CM

## 2018-09-11 LAB — COMPREHENSIVE METABOLIC PANEL
ALT: 15 U/L (ref 0–44)
AST: 29 U/L (ref 15–41)
Albumin: 2.9 g/dL — ABNORMAL LOW (ref 3.5–5.0)
Alkaline Phosphatase: 96 U/L (ref 38–126)
Anion gap: 10 (ref 5–15)
BUN: 11 mg/dL (ref 8–23)
CO2: 25 mmol/L (ref 22–32)
Calcium: 8.8 mg/dL — ABNORMAL LOW (ref 8.9–10.3)
Chloride: 106 mmol/L (ref 98–111)
Creatinine, Ser: 0.93 mg/dL (ref 0.61–1.24)
GFR calc Af Amer: >60 mL/min (ref >60)
GFR calc non Af Amer: >60 mL/min (ref >60)
Glucose, Bld: 127 mg/dL — ABNORMAL HIGH (ref 70–99)
Potassium: 4 mmol/L (ref 3.5–5.1)
Sodium: 141 mmol/L (ref 135–145)
Total Bilirubin: 1.8 mg/dL — ABNORMAL HIGH (ref 0.3–1.2)
Total Protein: 6.3 g/dL — ABNORMAL LOW (ref 6.5–8.1)

## 2018-09-11 LAB — AMMONIA: Ammonia: 45 umol/L — ABNORMAL HIGH (ref 9–35)

## 2018-09-11 LAB — CBC WITH DIFFERENTIAL/PLATELET
Abs Immature Granulocytes: 0.01 10*3/uL (ref 0.00–0.07)
Basophils Absolute: 0 10*3/uL (ref 0.0–0.1)
Basophils Relative: 0 %
Eosinophils Absolute: 0 10*3/uL (ref 0.0–0.5)
Eosinophils Relative: 2 %
HCT: 33.7 % — ABNORMAL LOW (ref 39.0–52.0)
Hemoglobin: 10.6 g/dL — ABNORMAL LOW (ref 13.0–17.0)
Immature Granulocytes: 0 %
Lymphocytes Relative: 21 %
Lymphs Abs: 0.5 10*3/uL — ABNORMAL LOW (ref 0.7–4.0)
MCH: 31.3 pg (ref 26.0–34.0)
MCHC: 31.5 g/dL (ref 30.0–36.0)
MCV: 99.4 fL (ref 80.0–100.0)
Monocytes Absolute: 0.3 10*3/uL (ref 0.1–1.0)
Monocytes Relative: 12 %
Neutro Abs: 1.6 10*3/uL — ABNORMAL LOW (ref 1.7–7.7)
Neutrophils Relative %: 65 %
Platelets: 53 10*3/uL — ABNORMAL LOW (ref 150–400)
RBC: 3.39 MIL/uL — ABNORMAL LOW (ref 4.22–5.81)
RDW: 16 % — ABNORMAL HIGH (ref 11.5–15.5)
WBC: 2.4 10*3/uL — ABNORMAL LOW (ref 4.0–10.5)
nRBC: 0 % (ref 0.0–0.2)

## 2018-09-11 LAB — URINALYSIS, ROUTINE W REFLEX MICROSCOPIC
Bilirubin Urine: NEGATIVE
Glucose, UA: NEGATIVE mg/dL
Hgb urine dipstick: NEGATIVE
Ketones, ur: NEGATIVE mg/dL
Leukocytes,Ua: NEGATIVE
Nitrite: NEGATIVE
Protein, ur: NEGATIVE mg/dL
Specific Gravity, Urine: 1.005 (ref 1.005–1.030)
pH: 7 (ref 5.0–8.0)

## 2018-09-11 LAB — BRAIN NATRIURETIC PEPTIDE: B Natriuretic Peptide: 486.9 pg/mL — ABNORMAL HIGH (ref 0.0–100.0)

## 2018-09-11 LAB — SARS CORONAVIRUS 2 BY RT PCR (HOSPITAL ORDER, PERFORMED IN ~~LOC~~ HOSPITAL LAB): SARS Coronavirus 2: NEGATIVE

## 2018-09-11 LAB — LACTIC ACID, PLASMA: Lactic Acid, Venous: 1.4 mmol/L (ref 0.5–1.9)

## 2018-09-11 LAB — GLUCOSE, CAPILLARY: Glucose-Capillary: 118 mg/dL — ABNORMAL HIGH (ref 70–99)

## 2018-09-11 LAB — CBG MONITORING, ED: Glucose-Capillary: 115 mg/dL — ABNORMAL HIGH (ref 70–99)

## 2018-09-11 MED ORDER — SPIRONOLACTONE 25 MG PO TABS
50.0000 mg | ORAL_TABLET | Freq: Every day | ORAL | Status: DC
  Administered 2018-09-12: 50 mg via ORAL
  Filled 2018-09-11: qty 2

## 2018-09-11 MED ORDER — INSULIN ASPART 100 UNIT/ML ~~LOC~~ SOLN: 0.0000 [IU] | Freq: Three times a day (TID) | SUBCUTANEOUS | Status: DC

## 2018-09-11 MED ORDER — LACTULOSE 10 GM/15ML PO SOLN
30.0000 g | Freq: Once | ORAL | Status: DC
  Filled 2018-09-11: qty 45

## 2018-09-11 MED ORDER — ONDANSETRON HCL 4 MG/2ML IJ SOLN: 4.0000 mg | Freq: Four times a day (QID) | INTRAMUSCULAR | Status: DC | PRN

## 2018-09-11 MED ORDER — TAMSULOSIN HCL 0.4 MG PO CAPS
0.4000 mg | ORAL_CAPSULE | Freq: Every evening | ORAL | Status: DC
  Administered 2018-09-11: 0.4 mg via ORAL
  Filled 2018-09-11: qty 1

## 2018-09-11 MED ORDER — FUROSEMIDE 10 MG/ML IJ SOLN
40.0000 mg | Freq: Once | INTRAMUSCULAR | Status: AC
  Administered 2018-09-11: 40 mg via INTRAVENOUS
  Filled 2018-09-11: qty 4

## 2018-09-11 MED ORDER — ALBUTEROL SULFATE (2.5 MG/3ML) 0.083% IN NEBU: 2.5000 mg | INHALATION_SOLUTION | RESPIRATORY_TRACT | Status: DC | PRN

## 2018-09-11 MED ORDER — HALOPERIDOL LACTATE 5 MG/ML IJ SOLN: 2.0000 mg | Freq: Four times a day (QID) | INTRAMUSCULAR | Status: DC | PRN

## 2018-09-11 MED ORDER — POLYVINYL ALCOHOL 1.4 % OP SOLN: 1.0000 [drp] | Freq: Two times a day (BID) | OPHTHALMIC | Status: DC | PRN

## 2018-09-11 MED ORDER — ASPIRIN EC 81 MG PO TBEC
81.0000 mg | DELAYED_RELEASE_TABLET | Freq: Every day | ORAL | Status: DC
  Administered 2018-09-12: 81 mg via ORAL
  Filled 2018-09-11: qty 1

## 2018-09-11 MED ORDER — ONDANSETRON HCL 4 MG PO TABS: 4.0000 mg | ORAL_TABLET | Freq: Four times a day (QID) | ORAL | Status: DC | PRN

## 2018-09-11 MED ORDER — INSULIN GLARGINE 100 UNIT/ML ~~LOC~~ SOLN
30.0000 [IU] | Freq: Two times a day (BID) | SUBCUTANEOUS | Status: DC
  Administered 2018-09-11 – 2018-09-12 (×2): 30 [IU] via SUBCUTANEOUS
  Filled 2018-09-11 (×3): qty 0.3

## 2018-09-11 MED ORDER — SIMVASTATIN 20 MG PO TABS
10.0000 mg | ORAL_TABLET | Freq: Every evening | ORAL | Status: DC
  Administered 2018-09-11: 10 mg via ORAL
  Filled 2018-09-11: qty 1

## 2018-09-11 MED ORDER — AMITRIPTYLINE HCL 25 MG PO TABS
25.0000 mg | ORAL_TABLET | Freq: Every day | ORAL | Status: DC
  Administered 2018-09-11: 25 mg via ORAL
  Filled 2018-09-11: qty 1

## 2018-09-11 MED ORDER — LACTULOSE 10 GM/15ML PO SOLN
30.0000 g | Freq: Three times a day (TID) | ORAL | Status: DC
  Filled 2018-09-11: qty 60

## 2018-09-11 MED ORDER — ACETAMINOPHEN 650 MG RE SUPP: 650.0000 mg | Freq: Four times a day (QID) | RECTAL | Status: DC | PRN

## 2018-09-11 MED ORDER — ACETAMINOPHEN 325 MG PO TABS: 650.0000 mg | ORAL_TABLET | Freq: Four times a day (QID) | ORAL | Status: DC | PRN

## 2018-09-11 MED ORDER — PANTOPRAZOLE SODIUM 40 MG PO TBEC
40.0000 mg | DELAYED_RELEASE_TABLET | Freq: Every day | ORAL | Status: DC
  Administered 2018-09-12: 40 mg via ORAL
  Filled 2018-09-11: qty 1

## 2018-09-11 MED ORDER — FUROSEMIDE 10 MG/ML IJ SOLN
40.0000 mg | Freq: Two times a day (BID) | INTRAMUSCULAR | Status: DC
  Administered 2018-09-12: 40 mg via INTRAVENOUS
  Filled 2018-09-11: qty 4

## 2018-09-11 MED ORDER — FINASTERIDE 5 MG PO TABS
5.0000 mg | ORAL_TABLET | Freq: Every evening | ORAL | Status: DC
  Administered 2018-09-11: 5 mg via ORAL
  Filled 2018-09-11: qty 1

## 2018-09-11 MED ORDER — INSULIN ASPART 100 UNIT/ML ~~LOC~~ SOLN: 0.0000 [IU] | Freq: Every day | SUBCUTANEOUS | Status: DC

## 2018-09-11 MED ORDER — FOLIC ACID 1 MG PO TABS
1.0000 mg | ORAL_TABLET | Freq: Every day | ORAL | Status: DC
  Administered 2018-09-12: 1 mg via ORAL
  Filled 2018-09-11: qty 1

## 2018-09-11 MED ORDER — RIFAXIMIN 550 MG PO TABS
550.0000 mg | ORAL_TABLET | Freq: Two times a day (BID) | ORAL | Status: DC
  Administered 2018-09-11 – 2018-09-12 (×2): 550 mg via ORAL
  Filled 2018-09-11 (×2): qty 1

## 2018-09-11 NOTE — ED Notes (Addendum)
ED TO INPATIENT HANDOFF REPORT  ED Nurse Name and Phone #: Garnette Czech [phone]  S Name/Age/Gender Keith [name] 78 y.o. male Room/Bed: 024C/024C  Code Status   Code Status: DNR  Home/SNF/Other Home Patient oriented to: self, place, time and situation Is this baseline? Yes   Triage Complete: Triage complete  Chief Complaint hallucinations  Triage Note Pt arrived via EMS after wife reports ongoing intermittent audio and visual hallucinations x 2 days.    Allergies Allergies  Allergen Reactions  . Contrast Media [Iodinated Diagnostic Agents] Rash    Cannot have contrast  . Fentanyl Itching and Rash  . Gadolinium Rash  . Iodine Rash  . Merbromin Rash  . Other Rash    "Opioids"    Level of Care/Admitting Diagnosis ED Disposition    ED Disposition Condition [facility] [place] [[id number]]  Level of Care: Med-Surg [16]  I expect the patient will be discharged within 24 hours: No (not a candidate for 5C-Observation unit)  Covid Evaluation: N/A  Diagnosis: Hallucinations [780.1.ICD-9-CM]  Admitting Physician: Aline August [[id number]]  Attending Physician: Aline August [[id number]]  Bed request comments: 5W  PT Class (Do Not Modify): Observation [104]  PT Acc Code (Do Not Modify): Observation [10022]       B Medical/Surgery History Past Medical History:  Diagnosis Date  . Cirrhosis of liver (HCC)    NASH  . Diabetes mellitus   . Hepatic cancer ([place])   . Prostate hypertrophy   . Shortness of breath   . Stroke ([place])   . Varicose veins   . Weakness of both legs    Past Surgical History:  Procedure Laterality Date  . APPENDECTOMY    . EYE SURGERY     bilateral cataract   . INTRAVASCULAR PRESSURE WIRE/FFR STUDY N/A 04/17/2018   Procedure: INTRAVASCULAR PRESSURE WIRE/FFR STUDY;  Surgeon: Adrian Prows, MD;  Location: [facility];  Service: Cardiovascular;  Laterality: N/A;  . left arm surgery     age 82   . left hand surgery     tendons ripped on left hand  . LIVER RESECTION    . RIGHT/LEFT HEART CATH AND CORONARY ANGIOGRAPHY N/A 04/17/2018   Procedure: RIGHT/LEFT HEART CATH AND CORONARY ANGIOGRAPHY;  Surgeon: Adrian Prows, MD;  Location: [facility];  Service: Cardiovascular;  Laterality: N/A;  . TEE WITHOUT CARDIOVERSION N/A 04/16/2018   Procedure: TRANSESOPHAGEAL ECHOCARDIOGRAM (TEE);  Surgeon: Adrian Prows, MD;  Location: [facility];  Service: Cardiovascular;  Laterality: N/A;  . TONSILLECTOMY    . TOTAL KNEE ARTHROPLASTY Left 12/14/2012   Procedure: LEFT TOTAL KNEE ARTHROPLASTY;  Surgeon: Gearlean Alf, MD;  Location: [facility];  Service: Orthopedics;  Laterality: Left;     A IV Location/Drains/Wounds Patient Lines/Drains/Airways Status   Active Line/Drains/Airways    Name:   Placement date:   Placement time:   Site:   Days:   Peripheral IV 09/11/18 Left Hand   09/11/18    1429    Hand   less than 1          Intake/Output Last 24 hours No intake or output data in the 24 hours ending 09/11/18 1847  Labs/Imaging Results for orders placed or performed during the hospital encounter of 09/11/18 (from the past 48 hour(s))  Ammonia     Status: Abnormal   Collection Time: 09/11/18  2:30 PM  Result Value Ref Range   Ammonia 45 (H) 9 - 35 umol/L  Comment: Performed at [facility], [place] [address] [place], [place] [address]  Comprehensive metabolic panel     Status: Abnormal   Collection Time: 09/11/18  2:30 PM  Result Value Ref Range   Sodium 141 135 - 145 mmol/L   Potassium 4.0 3.5 - 5.1 mmol/L   Chloride 106 98 - 111 mmol/L   CO2 25 22 - 32 mmol/L   Glucose, Bld 127 (H) 70 - 99 mg/dL   BUN 11 8 - 23 mg/dL   Creatinine, Ser 0.93 0.61 - 1.24 mg/dL   Calcium 8.8 (L) 8.9 - 10.3 mg/dL   Total Protein 6.3 (L) 6.5 - 8.1 g/dL   Albumin 2.9 (L) 3.5 - 5.0 g/dL   AST 29 15 - 41 U/L   ALT 15 0 - 44 U/L   Alkaline Phosphatase 96 38 - 126 U/L   Total Bilirubin 1.8 (H) 0.3 -  1.2 mg/dL   GFR calc non Af Amer >60 >60 mL/min   GFR calc Af Amer >60 >60 mL/min   Anion gap 10 5 - 15    Comment: Performed at [facility] [address], [place], [place] [address]  CBC with Differential     Status: Abnormal   Collection Time: 09/11/18  2:30 PM  Result Value Ref Range   WBC 2.4 (L) 4.0 - 10.5 K/uL   RBC 3.39 (L) 4.22 - 5.81 MIL/uL   Hemoglobin 10.6 (L) 13.0 - 17.0 g/dL   HCT 33.7 (L) 39.0 - 52.0 %   MCV 99.4 80.0 - 100.0 fL   MCH 31.3 26.0 - 34.0 pg   MCHC 31.5 30.0 - 36.0 g/dL   RDW 16.0 (H) 11.5 - 15.5 %   Platelets 53 (L) 150 - 400 K/uL    Comment: REPEATED TO VERIFY SPECIMEN CHECKED FOR CLOTS Immature Platelet Fraction may be clinically indicated, consider ordering this additional test [id number] CONSISTENT WITH PREVIOUS RESULT    nRBC 0.0 0.0 - 0.2 %   Neutrophils Relative % 65 %   Neutro Abs 1.6 (L) 1.7 - 7.7 K/uL   Lymphocytes Relative 21 %   Lymphs Abs 0.5 (L) 0.7 - 4.0 K/uL   Monocytes Relative 12 %   Monocytes Absolute 0.3 0.1 - 1.0 K/uL   Eosinophils Relative 2 %   Eosinophils Absolute 0.0 0.0 - 0.5 K/uL   Basophils Relative 0 %   Basophils Absolute 0.0 0.0 - 0.1 K/uL   Immature Granulocytes 0 %   Abs Immature Granulocytes 0.01 0.00 - 0.07 K/uL    Comment: Performed at [facility], [place] [address] [place], [place] [address]  Lactic acid, plasma     Status: None   Collection Time: 09/11/18  2:30 PM  Result Value Ref Range   Lactic Acid, Venous 1.4 0.5 - 1.9 mmol/L    Comment: Performed at [facility] [address] [place], [place] [address]  POC CBG, ED     Status: Abnormal   Collection Time: 09/11/18  2:31 PM  Result Value Ref Range   Glucose-Capillary 115 (H) 70 - 99 mg/dL  Brain natriuretic peptide     Status: Abnormal   Collection Time: 09/11/18  3:01 PM  Result Value Ref Range   B Natriuretic Peptide 486.9 (H) 0.0 - 100.0 pg/mL    Comment: Performed at [facility] [address] [place], [place]  [address]  SARS Coronavirus 2 (CEPHEID - Performed in [place] lab), [facility]  Order     Status: None   Collection Time: 09/11/18  3:39 PM  Result Value Ref Range   SARS Coronavirus 2 NEGATIVE NEGATIVE    Comment: (NOTE) If result is NEGATIVE SARS-CoV-2 target nucleic acids are NOT DETECTED. The SARS-CoV-2 RNA is generally detectable in upper and lower  respiratory specimens during the acute phase of infection. The lowest  concentration of SARS-CoV-2 viral copies this assay can detect is 250  copies / mL. A negative result does not preclude SARS-CoV-2 infection  and should not be used as the sole basis for treatment or other  patient management decisions.  A negative result may occur with  improper specimen collection / handling, submission of specimen other  than nasopharyngeal swab, presence of viral mutation(s) within the  areas targeted by this assay, and inadequate number of viral copies  (<250 copies / mL). A negative result must be combined with clinical  observations, patient history, and epidemiological information. If result is POSITIVE SARS-CoV-2 target nucleic acids are DETECTED. The SARS-CoV-2 RNA is generally detectable in upper and lower  respiratory specimens dur ing the acute phase of infection.  Positive  results are indicative of active infection with SARS-CoV-2.  Clinical  correlation with patient history and other diagnostic information is  necessary to determine patient infection status.  Positive results do  not rule out bacterial infection or co-infection with other viruses. If result is PRESUMPTIVE POSTIVE SARS-CoV-2 nucleic acids MAY BE PRESENT.   A presumptive positive result was obtained on the submitted specimen  and confirmed on repeat testing.  While 2019 novel coronavirus  (SARS-CoV-2) nucleic acids may be present in the submitted sample  additional confirmatory testing may be necessary for epidemiological  and / or clinical management purposes  to  differentiate between  SARS-CoV-2 and other Sarbecovirus currently known to infect humans.  If clinically indicated additional testing with an alternate test  methodology [phone]) is advised. The SARS-CoV-2 RNA is generally  detectable in upper and lower respiratory sp ecimens during the acute  phase of infection. The expected result is Negative. Fact Sheet for Patients:  StrictlyIdeas.no Fact Sheet for Healthcare Providers: BankingDealers.co.za This test is not yet approved or cleared by the [place] FDA and has been authorized for detection and/or diagnosis of SARS-CoV-2 by FDA under an Emergency Use Authorization (EUA).  This EUA will remain in effect (meaning this test can be used) for the duration of the COVID-19 declaration under Section 564(b)(1) of the Act, 21 U.S.C. section 360bbb-3(b)(1), unless the authorization is terminated or revoked sooner. Performed at [facility], [place] [address] [place], [place] [address]   Urinalysis, Routine w reflex microscopic     Status: Abnormal   Collection Time: 09/11/18  5:32 PM  Result Value Ref Range   Color, Urine STRAW (A) YELLOW   APPearance CLEAR CLEAR   Specific Gravity, Urine 1.005 1.005 - 1.030   pH 7.0 5.0 - 8.0   Glucose, UA NEGATIVE NEGATIVE mg/dL   Hgb urine dipstick NEGATIVE NEGATIVE   Bilirubin Urine NEGATIVE NEGATIVE   Ketones, ur NEGATIVE NEGATIVE mg/dL   Protein, ur NEGATIVE NEGATIVE mg/dL   Nitrite NEGATIVE NEGATIVE   Leukocytes,Ua NEGATIVE NEGATIVE    Comment: Performed at [facility] [address], [place], [place] [address]   Ct Head Wo Contrast  Result Date: 09/11/2018 CLINICAL DATA:  Altered mental status with lucent lesions for the past 2 days. EXAM: CT HEAD WITHOUT CONTRAST TECHNIQUE: Contiguous axial images were  obtained from the base of the skull through the vertex without intravenous contrast. COMPARISON:  CT head dated Aug 31, 2018.  FINDINGS: Brain: No evidence of acute infarction, hemorrhage, hydrocephalus, extra-axial collection or mass lesion/mass effect. Stable atrophy and chronic microvascular ischemic changes. Old tiny lacunar infarct in the left thalamus again noted. Old small right occipital lobe infarct again noted. Vascular: Calcified atherosclerosis at the skullbase. No hyperdense vessel. Skull: Normal. Negative for fracture or focal lesion. Sinuses/Orbits: No acute finding. Other: None. IMPRESSION: 1.  No acute intracranial abnormality. Electronically Signed   By: Titus Dubin M.D.   On: 09/11/2018 15:34   Dg Chest Port 1 View  Result Date: 09/11/2018 CLINICAL DATA:  Altered mental status EXAM: PORTABLE CHEST 1 VIEW COMPARISON:  Aug 31, 2018. FINDINGS: There is cardiomegaly with pulmonary venous hypertension. Pacemaker leads are attached to the right atrium and right ventricle. There is interstitial edema with pleural effusions bilaterally. There is no appreciable airspace consolidation. There is aortic atherosclerosis. No adenopathy. No bone lesions. IMPRESSION: Pulmonary vascular congestion with interstitial edema and bilateral pleural effusions. The appearance is felt to be indicative of a degree of congestive heart failure. Pacemaker leads attached to right atrium and right ventricle. Aortic Atherosclerosis (ICD10-I70.0). Electronically Signed   By: Lowella Grip III M.D.   On: 09/11/2018 14:55    Pending Labs Unresulted Labs (From admission, onward)    Start     Ordered   09/12/18 0500  CBC  Tomorrow morning,   R     09/11/18 1713   09/12/18 0500  Comprehensive metabolic panel  Tomorrow morning,   R     09/11/18 1713   09/12/18 0500  TSH  Tomorrow morning,   R     09/11/18 1713   09/12/18 0500  Folate  Tomorrow morning,   R     09/11/18 1713   09/12/18 0500  Vitamin B12  Tomorrow morning,   R     09/11/18 1713   09/12/18 0500  Ammonia  Tomorrow morning,   R     09/11/18 1713   09/12/18 0500   Hemoglobin A1c  Tomorrow morning,   R    Comments:  To assess prior glycemic control    09/11/18 1713   09/11/18 1414  Urine culture  ONCE - STAT,   STAT     09/11/18 1418          Vitals/Pain Today's Vitals   09/11/18 1746 09/11/18 1800 09/11/18 1815 09/11/18 1830  BP: 107/75 116/75 115/73 110/69  Pulse: 97   88  Resp: (!) 27 19 18 17   Temp:      TempSrc:      SpO2: 93%   96%  Weight:      Height:      PainSc:        Isolation Precautions No active isolations  Medications Medications  lactulose (CHRONULAC) 10 GM/15ML solution 30 g (30 g Oral Refused 09/11/18 1719)  aspirin EC tablet 81 mg (has no administration in time range)  rifaximin (XIFAXAN) tablet 550 mg (has no administration in time range)  simvastatin (ZOCOR) tablet 10 mg (has no administration in time range)  spironolactone (ALDACTONE) tablet 50 mg (has no administration in time range)  amitriptyline (ELAVIL) tablet 25 mg (has no administration in time range)  insulin glargine (LANTUS) injection 30 Units (has no administration in time range)  pantoprazole (PROTONIX) EC tablet 40 mg (has no administration in time range)  finasteride (PROSCAR) tablet 5 mg (  has no administration in time range)  tamsulosin (FLOMAX) capsule 0.4 mg (has no administration in time range)  folic acid (FOLVITE) tablet 1 mg (has no administration in time range)  albuterol (PROVENTIL) (2.5 MG/3ML) 0.083% nebulizer solution 2.5 mg (has no administration in time range)  polyvinyl alcohol (LIQUIFILM TEARS) 1.4 % ophthalmic solution 1 drop (has no administration in time range)  acetaminophen (TYLENOL) tablet 650 mg (has no administration in time range)    Or  acetaminophen (TYLENOL) suppository 650 mg (has no administration in time range)  lactulose (CHRONULAC) 10 GM/15ML solution 30 g (30 g Oral Refused 09/11/18 1723)  ondansetron (ZOFRAN) tablet 4 mg (has no administration in time range)    Or  ondansetron (ZOFRAN) injection 4 mg (has no  administration in time range)  insulin aspart (novoLOG) injection 0-15 Units (has no administration in time range)  insulin aspart (novoLOG) injection 0-5 Units (has no administration in time range)  furosemide (LASIX) injection 40 mg (has no administration in time range)  haloperidol lactate (HALDOL) injection 2 mg (has no administration in time range)  furosemide (LASIX) injection 40 mg (40 mg Intravenous Given 09/11/18 1719)    Mobility walks with device High fall risk   Focused Assessments Neuro Assessment Handoff:  Swallow screen pass? Yes          Neuro Assessment: Within Defined Limits Neuro Checks:      Last Documented NIHSS Modified Score:   Has TPA been given? No If patient is a Neuro Trauma and patient is going to OR before floor call report to [place] nurse: [phone] or [phone]     R Recommendations: See Admitting Provider Note  Report given to:   Additional Notes:   Probable hepatic encephalopathy  Hallucinations/acute metabolic encephalopathy History of dementia -Patient presents with intermittent worsening hallucinations for the last few days. -Unclear if this is secondary to worsening dementia versus hepatic encephalopathy.  Ammonia is 45 which is actually better than recent ammonia level of 82 on 08/31/2018.  Patient does not take any lactulose at home as per the wife because it gives him diarrhea. -We will put the patient on lactulose 3 times a day for now.  Continue rifaximin. -Fall precautions.  Monitor mental status.  If mental status does not improve and if patient continues to have hallucinations, might have to consider MRI of the brain and psychiatry evaluation. -We will use Haldol as needed extreme agitation -PT evaluation. -Patient's overall condition is progressively getting worse as per the wife including worsening of mental status.  He has various comorbidities including moderate to severe aortic stenosis for which she requires a TAVR  procedure but because of his GI problems, he probably is a poor candidate.  As per wife's request, we will request palliative care consultation. -Hold Namenda and Lyrica  Cirrhosis of liver secondary to NASH with history of esophageal varices status post recent banding few weeks ago at [facility] Chronic pancytopenia -Outpatient follow-up with [facility] gastroenterology.  Continue rifaximin and lactulose as above.  Continue spironolactone and will use Lasix intravenously for now  Acute on chronic combined systolic and diastolic heart failure Moderate to severe aortic stenosis -TEE on 04/16/2018 had shown EF of 35 to 40% with global hypokinesis -Patient is slightly volume overloaded and chest x-ray shows pulmonary congestion with bilateral pleural effusions.  Will treat with intravenous Lasix 40 mg twice a day.  Strict input and output.  Daily weights. -Patient follows up with Dr. Einar Gip.  Will notify him of patient's  admission -Plan for TAVR as above  Diabetes mellitus type 2 uncontrolled with hyperglycemia -Continue Lantus along with CBGs with SSI.  Hypertension -Monitor blood pressure.  Continue Lasix and spironolactone.  Dyslipidemia -Continue statin  Morbid obesity -Outpatient follow-up

## 2018-09-11 NOTE — H&P (Signed)
History and Physical    BRAHM BARBEAU RFF:[id number] DOB: 1941/01/27 DOA: 09/11/2018  PCP: [facility]   Patient coming from: Home  I have personally briefly reviewed patient's old medical records in [place]  Chief Complaint: Hallucinations  HPI: Keith [name] is a 78 y.o. male with medical history significant of TIA, hepatocellular cancer status post partial hepatectomy, cirrhosis of liver due to NASH followed by gastroenterology at [facility], esophageal varices status post recent banding few weeks ago at [facility], chronic thrombocytopenia, hypertension, hyperlipidemia, diabetes mellitus, chronic combined systolic and diastolic heart failure, moderate to severe aortic stenosis, secondary AV block status post pacemaker, morbid obesity, obstructive sleep apnea, dementia presented with hallucinations for the last few days.  Patient is a poor historian and does not provide appropriate history.  I spoke to [name]/wife on phone and as per her, patient has been having worsening intermittent hallucinations, mostly visual over the last few days happening mostly at nights.  Patient has been stating that he is being seeing things that are not there.  As per her, he is getting increasingly weak and sleeping up to almost 20 hours a day.  Apparently, he has.'s when he is lucid and his typical self and other times he has hallucinations.  He has been become increasingly weak since his last esophageal variceal banding at [facility] few weeks ago.  After her last recent esophageal banding at [facility], patient/family were told that they should have a palliative care discussion as well.  No fever, vomiting, increasing abdominal size, dysuria, hematuria, worsening shortness of breath reported.  No history of seizures reported.  Patient also has history of Alzheimer's dementia and wife was also concerned if this is worsening of the dementia.  She contacted patient's GI doctors at [facility] who recommended that  patient be evaluated to rule out hepatic encephalopathy.  Of note, patient does not take lactulose at home because it gives him diarrhea.  There is no recent history of falls.  ED Course: Blood work revealed ammonia 45 with total bilirubin of 1.8, platelets of 53.  CT of the brain was negative for any acute abnormality. Chest x-ray showed pulmonary vascular congestion with bilateral pleural effusions.  Hospitalist service was called to evaluate the patient.  I asked the ED provider to give lactulose and Lasix to the patient.  Review of Systems: Could not be appropriately obtained because of patient's mental status.  Most of the history was obtained from the wife on phone.  Past Medical History:  Diagnosis Date   Cirrhosis of liver ([place])    NASH   Diabetes mellitus    Hepatic cancer ([place])    Prostate hypertrophy    Shortness of breath    Stroke ([place])    Varicose veins    Weakness of both legs     Past Surgical History:  Procedure Laterality Date   APPENDECTOMY     EYE SURGERY     bilateral cataract    INTRAVASCULAR PRESSURE WIRE/FFR STUDY N/A 04/17/2018   Procedure: INTRAVASCULAR PRESSURE WIRE/FFR STUDY;  Surgeon: Adrian Prows, MD;  Location: [place] [facility];  Service: Cardiovascular;  Laterality: N/A;   left arm surgery     age 25   left hand surgery     tendons ripped on left hand   LIVER RESECTION     RIGHT/LEFT HEART CATH AND CORONARY ANGIOGRAPHY N/A 04/17/2018   Procedure: RIGHT/LEFT HEART CATH AND CORONARY ANGIOGRAPHY;  Surgeon: Adrian Prows, MD;  Location: [place] [facility];  Service: Cardiovascular;  Laterality: N/A;   TEE WITHOUT CARDIOVERSION N/A 04/16/2018   Procedure: TRANSESOPHAGEAL ECHOCARDIOGRAM (TEE);  Surgeon: Adrian Prows, MD;  Location: [place];  Service: Cardiovascular;  Laterality: N/A;   TONSILLECTOMY     TOTAL KNEE ARTHROPLASTY Left 12/14/2012   Procedure: LEFT TOTAL KNEE ARTHROPLASTY;  Surgeon: Gearlean Alf, MD;  Location: [facility];  Service:  Orthopedics;  Laterality: Left;   Social history  reports that he quit smoking about 50 years ago. His smoking use included cigarettes. He has a 45.00 pack-year smoking history. He has never used smokeless tobacco. He reports that he does not drink alcohol or use drugs.  Allergies  Allergen Reactions   Contrast Media [Iodinated Diagnostic Agents] Rash    Cannot have contrast   Fentanyl Itching and Rash   Gadolinium Rash   Iodine Rash   Merbromin Rash   Other Rash    "Opioids"    Family History  Problem Relation Age of Onset   Stroke Father    Heart failure Brother 42       Identical twin   Aortic stenosis Mother     Prior to Admission medications   Medication Sig Start Date End Date Taking? Authorizing Provider  acetaminophen (TYLENOL) 500 MG tablet Take 500-1,000 mg by mouth every 6 (six) hours as needed for moderate pain.   Yes [provider]  amitriptyline (ELAVIL) 25 MG tablet Take 25 mg by mouth at bedtime.    Yes [provider]  aspirin EC 81 MG EC tablet Take 1 tablet (81 mg total) by mouth daily. 04/19/18  Yes Mariel Aloe, MD  Cholecalciferol (VITAMIN D3) 50 MCG (2000 UT) TABS Take 2,000 Units by mouth daily.   Yes [provider]  cyanocobalamin (,VITAMIN B-12,) 1000 MCG/ML injection Inject 1,000 mcg into the muscle every 30 (thirty) days.   Yes [provider]  finasteride (PROSCAR) 5 MG tablet Take 5 mg by mouth every evening.    Yes [provider]  folic acid (FOLVITE) 1 MG tablet Take 1 mg by mouth daily. 02/13/09  Yes [provider]  furosemide (LASIX) 20 MG tablet Take 20 mg by mouth daily.   Yes [provider]  hydrocerin (EUCERIN) CREA Apply 1 application topically daily as needed (dry skin).    Yes [provider]  insulin aspart (NOVOLOG) 100 UNIT/ML injection Inject 5-30 Units into the skin See admin instructions. Inject 5-30 units into the skin three times a day before  meals, per sliding scale   Yes [provider]  insulin glargine (LANTUS) 100 UNIT/ML injection Inject 30 Units into the skin 2 (two) times daily. Inject 30 units into the skin 2 times a day- 10 AM and bedtime   Yes [provider]  memantine (NAMENDA) 10 MG tablet Take 20 mg by mouth at bedtime.   Yes [provider]  pantoprazole (PROTONIX) 40 MG tablet Take 40 mg by mouth daily.   Yes [provider]  polyvinyl alcohol (LIQUIFILM TEARS) 1.4 % ophthalmic solution Place 1 drop into both eyes 2 (two) times daily as needed (dry eyes).   Yes [provider]  pregabalin (LYRICA) 300 MG capsule Take 300 mg by mouth 2 (two) times daily.   Yes [provider]  rifaximin (XIFAXAN) 550 MG TABS tablet Take 550 mg by mouth 2 (two) times daily.   Yes [provider]  simvastatin (ZOCOR) 20 MG tablet Take 10 mg by mouth every evening.  Yes [provider]  spironolactone (ALDACTONE) 25 MG tablet Take 2 tablets (50 mg total) by mouth daily. 06/05/18 09/11/18 Yes Adrian Prows, MD  tamsulosin (FLOMAX) 0.4 MG CAPS Take 0.4 mg by mouth every evening.    Yes [provider]  traMADol (ULTRAM) 50 MG tablet Take 1 tablet (50 mg total) by mouth every 12 (twelve) hours as needed for moderate pain. Patient taking differently: Take 50 mg by mouth every 6 (six) hours as needed (for pain).  12/19/17  Yes Vann, Jessica U, DO  albuterol (PROVENTIL HFA;VENTOLIN HFA) 108 (90 BASE) MCG/ACT inhaler Inhale 2 puffs into the lungs every 4 (four) hours as needed for wheezing or shortness of breath.     [provider]    Physical Exam: Vitals:   09/11/18 1445 09/11/18 1500 09/11/18 1530 09/11/18 1545  BP: 116/77 119/78 112/82 127/89  Pulse: 86 91 90 86  Resp: 16 18 (!) 21 17  Temp:      TempSrc:      SpO2: 93% 93% 93% 95%  Weight:      Height:        Constitutional: Elderly male.  Lying in bed.  NAD, calm, comfortable.  Looks chronically  ill. Vitals:   09/11/18 1445 09/11/18 1500 09/11/18 1530 09/11/18 1545  BP: 116/77 119/78 112/82 127/89  Pulse: 86 91 90 86  Resp: 16 18 (!) 21 17  Temp:      TempSrc:      SpO2: 93% 93% 93% 95%  Weight:      Height:       Eyes: PERRL, lids and conjunctivae normal ENMT: Mucous membranes are moist. Posterior pharynx clear of any exudate or lesions. Neck: normal, supple, no masses, no thyromegaly.  No JVD Respiratory: bilateral decreased breath sounds at bases, no wheezing; scattered bibasilar crackles.  Normal respiratory effort. No accessory muscle use.  Cardiovascular: S1 S2 positive, rate controlled.  1+ lower extremity edema.  2+ pedal pulses.  Abdomen: no tenderness, no masses palpated.  Slightly distended.  No hepatosplenomegaly. Bowel sounds positive.  Musculoskeletal: no clubbing / cyanosis. No joint deformity upper and lower extremities.  Skin: no rashes, lesions, ulcers. No induration.  Chronic skin changes in bilateral lower extremities. Neurologic: Awake but slightly confused.  No active hallucinations currently.  CN 2-12 grossly intact. Moving extremities. No focal neurologic deficits.  No asterixis. Psychiatric: Could not be assessed because of mental status..   Labs on Admission: I have personally reviewed following labs and imaging studies  CBC: Recent Labs  Lab 09/11/18 1430  WBC 2.4*  NEUTROABS 1.6*  HGB 10.6*  HCT 33.7*  MCV 99.4  PLT 53*   Basic Metabolic Panel: Recent Labs  Lab 09/11/18 1430  NA 141  K 4.0  CL 106  CO2 25  GLUCOSE 127*  BUN 11  CREATININE 0.93  CALCIUM 8.8*   GFR: Estimated Creatinine Clearance: 92.5 mL/min (by C-G formula based on SCr of 0.93 mg/dL). Liver Function Tests: Recent Labs  Lab 09/11/18 1430  AST 29  ALT 15  ALKPHOS 96  BILITOT 1.8*  PROT 6.3*  ALBUMIN 2.9*   No results for input(s): LIPASE, AMYLASE in the last 168 hours. Recent Labs  Lab 09/11/18 1430  AMMONIA 45*   Coagulation Profile: No results  for input(s): INR, PROTIME in the last 168 hours. Cardiac Enzymes: No results for input(s): CKTOTAL, CKMB, CKMBINDEX, TROPONINI in the last 168 hours. BNP (last 3 results) No results for input(s): PROBNP in the last  8760 hours. HbA1C: No results for input(s): HGBA1C in the last 72 hours. CBG: Recent Labs  Lab 09/11/18 1431  GLUCAP 115*   Lipid Profile: No results for input(s): CHOL, HDL, LDLCALC, TRIG, CHOLHDL, LDLDIRECT in the last 72 hours. Thyroid Function Tests: No results for input(s): TSH, T4TOTAL, FREET4, T3FREE, THYROIDAB in the last 72 hours. Anemia Panel: No results for input(s): VITAMINB12, FOLATE, FERRITIN, TIBC, IRON, RETICCTPCT in the last 72 hours. Urine analysis:    Component Value Date/Time   COLORURINE YELLOW 08/31/2018 1310   APPEARANCEUR CLEAR 08/31/2018 1310   LABSPEC 1.014 08/31/2018 1310   PHURINE 6.0 08/31/2018 1310   GLUCOSEU NEGATIVE 08/31/2018 1310   HGBUR NEGATIVE 08/31/2018 1310   BILIRUBINUR NEGATIVE 08/31/2018 1310   KETONESUR NEGATIVE 08/31/2018 1310   PROTEINUR NEGATIVE 08/31/2018 1310   UROBILINOGEN 0.2 12/03/2012 1338   NITRITE NEGATIVE 08/31/2018 1310   LEUKOCYTESUR NEGATIVE 08/31/2018 1310    Radiological Exams on Admission: Ct Head Wo Contrast  Result Date: 09/11/2018 CLINICAL DATA:  Altered mental status with lucent lesions for the past 2 days. EXAM: CT HEAD WITHOUT CONTRAST TECHNIQUE: Contiguous axial images were obtained from the base of the skull through the vertex without intravenous contrast. COMPARISON:  CT head dated Aug 31, 2018. FINDINGS: Brain: No evidence of acute infarction, hemorrhage, hydrocephalus, extra-axial collection or mass lesion/mass effect. Stable atrophy and chronic microvascular ischemic changes. Old tiny lacunar infarct in the left thalamus again noted. Old small right occipital lobe infarct again noted. Vascular: Calcified atherosclerosis at the skullbase. No hyperdense vessel. Skull: Normal. Negative for fracture  or focal lesion. Sinuses/Orbits: No acute finding. Other: None. IMPRESSION: 1.  No acute intracranial abnormality. Electronically Signed   By: Titus Dubin M.D.   On: 09/11/2018 15:34   Dg Chest Port 1 View  Result Date: 09/11/2018 CLINICAL DATA:  Altered mental status EXAM: PORTABLE CHEST 1 VIEW COMPARISON:  Aug 31, 2018. FINDINGS: There is cardiomegaly with pulmonary venous hypertension. Pacemaker leads are attached to the right atrium and right ventricle. There is interstitial edema with pleural effusions bilaterally. There is no appreciable airspace consolidation. There is aortic atherosclerosis. No adenopathy. No bone lesions. IMPRESSION: Pulmonary vascular congestion with interstitial edema and bilateral pleural effusions. The appearance is felt to be indicative of a degree of congestive heart failure. Pacemaker leads attached to right atrium and right ventricle. Aortic Atherosclerosis (ICD10-I70.0). Electronically Signed   By: Lowella Grip III M.D.   On: 09/11/2018 14:55    Assessment/Plan Active Problems:   Hypertension   Thrombocytopenia, unspecified (HCC)   Hyperlipidemia   Cirrhosis of liver (HCC)   Diabetes mellitus type 2 in obese ([place])   Hallucination   Hepatic encephalopathy (HCC)   Hallucinations  Probable hepatic encephalopathy  Hallucinations/acute metabolic encephalopathy History of dementia -Patient presents with intermittent worsening hallucinations for the last few days. -Unclear if this is secondary to worsening dementia versus hepatic encephalopathy.  Ammonia is 45 which is actually better than recent ammonia level of 82 on 08/31/2018.  Patient does not take any lactulose at home as per the wife because it gives him diarrhea. -We will put the patient on lactulose 3 times a day for now.  Continue rifaximin. -Fall precautions.  Monitor mental status.  If mental status does not improve and if patient continues to have hallucinations, might have to consider MRI of  the brain and psychiatry evaluation. -We will use Haldol as needed extreme agitation -PT evaluation. -Patient's overall condition is progressively getting worse as per the  wife including worsening of mental status.  He has various comorbidities including moderate to severe aortic stenosis for which she requires a TAVR procedure but because of his GI problems, he probably is a poor candidate.  As per wife's request, we will request palliative care consultation. -Hold Namenda and Lyrica  Cirrhosis of liver secondary to NASH with history of esophageal varices status post recent banding few weeks ago at [facility] Chronic pancytopenia -Outpatient follow-up with [facility] gastroenterology.  Continue rifaximin and lactulose as above.  Continue spironolactone and will use Lasix intravenously for now  Acute on chronic combined systolic and diastolic heart failure Moderate to severe aortic stenosis -TEE on 04/16/2018 had shown EF of 35 to 40% with global hypokinesis -Patient is slightly volume overloaded and chest x-ray shows pulmonary congestion with bilateral pleural effusions.  Will treat with intravenous Lasix 40 mg twice a day.  Strict input and output.  Daily weights. -Patient follows up with Dr. Einar Gip.  Will notify him of patient's admission -Plan for TAVR as above  Diabetes mellitus type 2 uncontrolled with hyperglycemia -Continue Lantus along with CBGs with SSI.  Hypertension -Monitor blood pressure.  Continue Lasix and spironolactone.  Dyslipidemia -Continue statin  Morbid obesity -Outpatient follow-up   DVT prophylaxis: Lovenox Code Status: DNR.  Discussed with the wife who confirms this Family Communication: Spoke to wife Edd Fabian on phone Disposition Plan: Home in 1 to 2 days if clinically improved Consults called: None Admission status: Observation/MedSurg  Severity of Illness: The appropriate patient status for this patient is OBSERVATION. Observation status is judged to be reasonable and  necessary in order to provide the required intensity of service to ensure the patient's safety. [name] presenting symptoms, physical exam findings, and initial radiographic and laboratory data in the context of their medical condition is felt to place them at decreased risk for further clinical deterioration. Furthermore, it is anticipated that the patient will be medically stable for discharge from the hospital within 2 midnights of admission. The following factors support the patient status of observation.   " [name] presenting symptoms include hallucinations. " The physical exam findings include confusion/peripheral edema. " The initial radiographic and laboratory data are pancytopenia, mildly elevated ammonia, congestion on chest x-ray.      Aline August MD [facility]  09/11/2018, 5:13 PM

## 2018-09-11 NOTE — ED Provider Notes (Signed)
  Physical Exam  BP 112/82   Pulse 90   Temp 97.7 F (36.5 C) (Oral)   Resp (!) 21   Ht 5' 9"  (1.753 m)   Wt 136 kg   SpO2 93%   BMI 44.28 kg/m   Physical Exam  ED Course/Procedures     Procedures  MDM  Assumed care from outgoing provider at 1600.  Please see their note for full details. Patient is a 78 year old male who presents with Altered mental status, worsening AVH, primary historian is Wife # [phone] Significant liver history, will need admission, waiting on some labs  Patient will be admitted for further evaluation and care. Patient admitted. The above care was discussed with and agreed upon by my attending physician.        Lonzo Candy, MD 09/11/18 Yevette Edwards    Lajean Saver, MD 09/11/18 2230

## 2018-09-11 NOTE — ED Notes (Signed)
Pt stated he did not have you use RR at this time

## 2018-09-11 NOTE — ED Provider Notes (Signed)
[facility] EMERGENCY DEPARTMENT Provider Note   CSN: [id number] Arrival date & time: 09/11/18  1349    History   Chief Complaint Chief Complaint  Patient presents with   Hallucinations    HPI Keith [name] is a 78 y.o. male.     Keith [name] is a 78 y.o. male with history of Karlene Lineman cirrhosis, hepatocellular carcinoma, diabetes, stroke, obesity and dementia, who presents to the emergency department for evaluation of altered mental status and hallucinations.  Per the patient's wife over the past 2 to 3 days he has been having persistent and worsening hallucinations and confusion, he has been generally more weak than usual and sleeping for almost 20 hours a day, she reports this is very atypical for him.  She reports he goes through short periods where he is lucid and his typical self, but then will go through periods where he is very clearly having visual and auditory hallucinations.  He has become increasingly weak since his last esophageal variceal banding, he is followed by [facility].  He reports he has to use either a wheelchair or a walker now to get around.  He is had falls at home over the past few weeks, and was seen in the ED on 5/26 for a fall.  She is contacted patient's GI doctors and they are concerned for possible hepatic encephalopathy and worsening liver function as cause for patient's confusion and altered mental status.  He has had elevated ammonia levels in the past.  Patient had at one point been given a possible diagnosis of Alzheimer's from [place] neurologist but GI doctor is concerned that it may be more so related to his liver function especially given acute worsening in the past few days.  GI doctor recommended patient present to the hospital and felt that patient would need admission for further evaluation of AMS.  History obtained from patient, medical records and patient's wife, Keith [name] ([phone])     Past Medical History:    Diagnosis Date   Cirrhosis of liver ([place])    NASH   Diabetes mellitus    Hepatic cancer ([place])    Prostate hypertrophy    Shortness of breath    Stroke [facility])    Varicose veins    Weakness of both legs     Patient Active Problem List   Diagnosis Date Noted   Chronic combined systolic and diastolic CHF, NYHA class 1 ([place]) 05/11/2018   Severe aortic stenosis 04/15/2018   AV block 04/15/2018   Acute respiratory distress 04/15/2018   Chest pressure 04/15/2018   Acute combined systolic and diastolic (congestive) hrt fail ([place]) 04/14/2018   Obesity, Class III, BMI 40-49.9 (morbid obesity) ([place]) 04/14/2018   Diabetes mellitus type 2 in obese ([facility])    Orthostatic hypotension    Syncope 12/17/2017   Postural dizziness with near syncope 12/17/2017   Near syncope 12/17/2017   Increased ammonia level 11/30/2017   Hepatic cancer ([place]) 11/30/2017   Dementia ([place]) 11/30/2017   BPH (benign prostatic hyperplasia) 11/30/2017   Coronary artery disease 11/30/2017   Weakness 11/23/2017   Cirrhosis of liver ([place]) 11/23/2017   OSA (obstructive sleep apnea) 11/23/2017   Varicose veins of left lower extremity with complications 18/29/9371   Knee pain, acute 01/26/2013   Neuropathy 12/22/2012   Prostate hypertrophy 12/22/2012   Hyperlipidemia 12/22/2012   GERD (gastroesophageal reflux disease) 12/17/2012   Hypertension 12/17/2012   Thrombocytopenia, unspecified ([place]) 12/17/2012   Hyponatremia 12/15/2012   OA (  osteoarthritis) of knee 12/14/2012    Past Surgical History:  Procedure Laterality Date   APPENDECTOMY     EYE SURGERY     bilateral cataract    INTRAVASCULAR PRESSURE WIRE/FFR STUDY N/A 04/17/2018   Procedure: INTRAVASCULAR PRESSURE WIRE/FFR STUDY;  Surgeon: Adrian Prows, MD;  Location: [facility];  Service: Cardiovascular;  Laterality: N/A;   left arm surgery     age 69   left hand surgery     tendons ripped on left hand   LIVER  RESECTION     RIGHT/LEFT HEART CATH AND CORONARY ANGIOGRAPHY N/A 04/17/2018   Procedure: RIGHT/LEFT HEART CATH AND CORONARY ANGIOGRAPHY;  Surgeon: Adrian Prows, MD;  Location: [facility];  Service: Cardiovascular;  Laterality: N/A;   TEE WITHOUT CARDIOVERSION N/A 04/16/2018   Procedure: TRANSESOPHAGEAL ECHOCARDIOGRAM (TEE);  Surgeon: Adrian Prows, MD;  Location: [facility];  Service: Cardiovascular;  Laterality: N/A;   TONSILLECTOMY     TOTAL KNEE ARTHROPLASTY Left 12/14/2012   Procedure: LEFT TOTAL KNEE ARTHROPLASTY;  Surgeon: Gearlean Alf, MD;  Location: [facility];  Service: Orthopedics;  Laterality: Left;        Home Medications    Prior to Admission medications   Medication Sig Start Date End Date Taking? Authorizing Provider  acetaminophen (TYLENOL) 500 MG tablet Take 500-1,000 mg by mouth every 6 (six) hours as needed for moderate pain.    [provider]  albuterol (PROVENTIL HFA;VENTOLIN HFA) 108 (90 BASE) MCG/ACT inhaler Inhale 2 puffs into the lungs every 4 (four) hours as needed for wheezing or shortness of breath.     [provider]  amitriptyline (ELAVIL) 25 MG tablet Take 25 mg by mouth at bedtime.     [provider]  aspirin EC 81 MG EC tablet Take 1 tablet (81 mg total) by mouth daily. 04/19/18   Mariel Aloe, MD  Cholecalciferol (VITAMIN D3) 50 MCG (2000 UT) TABS Take 2,000 Units by mouth daily.    [provider]  cyanocobalamin (,VITAMIN B-12,) 1000 MCG/ML injection Inject 1,000 mcg into the muscle every 30 (thirty) days.    [provider]  finasteride (PROSCAR) 5 MG tablet Take 5 mg by mouth every evening.     [provider]  folic acid (FOLVITE) 1 MG tablet Take 1 mg by mouth daily. 02/13/09   [provider]  furosemide (LASIX) 20 MG tablet Take 20 mg by mouth daily.    [provider]  hydrocerin (EUCERIN) CREA Apply 1 application topically daily as needed (dry skin).     [provider]  insulin aspart (NOVOLOG) 100 UNIT/ML injection Inject 5-30 Units into the skin See admin instructions. Inject 5-30 units into the skin three times a day before meals, per sliding scale    [provider]  insulin glargine (LANTUS) 100 UNIT/ML injection Inject 40 Units into the skin 2 (two) times daily. Inject 40 units into the skin 2 times a day- 10 AM and bedtime    [provider]  memantine (NAMENDA) 10 MG tablet Take 20 mg by mouth at bedtime.    [provider]  pantoprazole (PROTONIX) 40 MG tablet Take 40 mg by mouth daily.    [provider]  polyvinyl alcohol (LIQUIFILM TEARS) 1.4 % ophthalmic solution Place 1 drop into both eyes 2 (two) times daily as needed (dry eyes).    [provider]  pregabalin (LYRICA) 300 MG capsule Take 300 mg by mouth 2 (two) times  daily.    [provider]  rifaximin (XIFAXAN) 550 MG TABS tablet Take 550 mg by mouth 2 (two) times daily.    [provider]  simvastatin (ZOCOR) 20 MG tablet Take 10 mg by mouth every evening.    [provider]  spironolactone (ALDACTONE) 25 MG tablet Take 2 tablets (50 mg total) by mouth daily. 06/05/18 09/03/18  Adrian Prows, MD  tamsulosin (FLOMAX) 0.4 MG CAPS Take 0.4 mg by mouth every evening.     [provider]  traMADol (ULTRAM) 50 MG tablet Take 1 tablet (50 mg total) by mouth every 12 (twelve) hours as needed for moderate pain. Patient taking differently: Take 50 mg by mouth daily as needed for moderate pain.  12/19/17   Geradine Girt, DO    Family History Family History  Problem Relation Age of Onset   Stroke Father    Heart failure Brother 41       Identical twin   Aortic stenosis Mother     Social History Social History   Tobacco Use   Smoking status: Former Smoker    Packs/day: 3.00    Years: 15.00    Pack years: 45.00    Types: Cigarettes    Last attempt to quit: 1970    Years since quitting: 50.4    Smokeless tobacco: Never Used  Substance Use Topics   Alcohol use: No    Alcohol/week: 0.0 standard drinks   Drug use: No     Allergies   Contrast media [iodinated diagnostic agents]; Fentanyl; Gadolinium; Iodine; Merbromin; and Other   Review of Systems Review of Systems  Constitutional: Negative for chills and fever.  HENT: Negative.   Eyes: Negative for visual disturbance.  Respiratory: Negative for cough and shortness of breath.   Cardiovascular: Negative for chest pain.  Gastrointestinal: Negative for abdominal pain, nausea and vomiting.  Musculoskeletal: Negative for arthralgias and myalgias.  Skin: Negative for color change and rash.  Neurological: Negative for dizziness and headaches.  Psychiatric/Behavioral: Positive for confusion and hallucinations.     Physical Exam Updated Vital Signs BP 105/69 (BP Location: Right Arm)    Pulse 91    Temp 97.7 F (36.5 C) (Oral)    Resp 13    Ht 5' 9"  (1.753 m)    Wt 136 kg    SpO2 100%    BMI 44.28 kg/m   Physical Exam Vitals signs and nursing note reviewed.  Constitutional:      General: He is not in acute distress.    Appearance: He is well-developed. He is obese. He is not diaphoretic.     Comments: Slight jaundice, alert, responsive and following commands  HENT:     Head: Normocephalic and atraumatic.     Mouth/Throat:     Mouth: Mucous membranes are moist.     Pharynx: Oropharynx is clear.  Eyes:     General:        Right eye: No discharge.        Left eye: No discharge.     Pupils: Pupils are equal, round, and reactive to light.  Neck:     Musculoskeletal: Neck supple.  Cardiovascular:     Rate and Rhythm: Normal rate and regular rhythm.     Heart sounds: Normal heart sounds. No murmur. No friction rub. No gallop.   Pulmonary:     Effort: Pulmonary effort is normal. No respiratory distress.     Breath sounds: Normal breath sounds. No wheezing or rales.  Comments: Respirations equal and unlabored,  patient able to speak in full sentences, lungs clear to auscultation bilaterally Abdominal:     General: Bowel sounds are normal. There is no distension.     Palpations: Abdomen is soft. There is no mass.     Tenderness: There is no abdominal tenderness. There is no guarding.     Comments: Abdomen soft, nondistended, nontender to palpation in all quadrants without guarding or peritoneal signs  Musculoskeletal:        General: No deformity.     Comments: 1+ pitting edema to bilateral lower extremities up to the level of the shin, some darkening of the skin noted but 2+ DP and TP pulses  Skin:    General: Skin is warm and dry.     Capillary Refill: Capillary refill takes less than 2 seconds.  Neurological:     Mental Status: He is alert.     Coordination: Coordination normal.     Comments: Speech is clear, able to follow commands CN III-XII intact Normal strength in upper and lower extremities bilaterally including dorsiflexion and plantar flexion, strong and equal grip strength Sensation normal to light and sharp touch Moves extremities without ataxia, coordination intact   Psychiatric:        Mood and Affect: Mood normal.        Behavior: Behavior normal.      ED Treatments / Results  Labs (all labs ordered are listed, but only abnormal results are displayed) Labs Reviewed  AMMONIA - Abnormal; Notable for the following components:      Result Value   Ammonia 45 (*)    All other components within normal limits  COMPREHENSIVE METABOLIC PANEL - Abnormal; Notable for the following components:   Glucose, Bld 127 (*)    Calcium 8.8 (*)    Total Protein 6.3 (*)    Albumin 2.9 (*)    Total Bilirubin 1.8 (*)    All other components within normal limits  CBC WITH DIFFERENTIAL/PLATELET - Abnormal; Notable for the following components:   WBC 2.4 (*)    RBC 3.39 (*)    Hemoglobin 10.6 (*)    HCT 33.7 (*)    RDW 16.0 (*)    Platelets 53 (*)    Neutro Abs 1.6 (*)    Lymphs Abs 0.5  (*)    All other components within normal limits  CBG MONITORING, ED - Abnormal; Notable for the following components:   Glucose-Capillary 115 (*)    All other components within normal limits  URINE CULTURE  SARS CORONAVIRUS 2 (HOSPITAL ORDER, [facility])  LACTIC ACID, PLASMA  URINALYSIS, ROUTINE W REFLEX MICROSCOPIC  LACTIC ACID, PLASMA  BRAIN NATRIURETIC PEPTIDE    EKG None  Radiology Ct Head Wo Contrast  Result Date: 09/11/2018 CLINICAL DATA:  Altered mental status with lucent lesions for the past 2 days. EXAM: CT HEAD WITHOUT CONTRAST TECHNIQUE: Contiguous axial images were obtained from the base of the skull through the vertex without intravenous contrast. COMPARISON:  CT head dated Aug 31, 2018. FINDINGS: Brain: No evidence of acute infarction, hemorrhage, hydrocephalus, extra-axial collection or mass lesion/mass effect. Stable atrophy and chronic microvascular ischemic changes. Old tiny lacunar infarct in the left thalamus again noted. Old small right occipital lobe infarct again noted. Vascular: Calcified atherosclerosis at the skullbase. No hyperdense vessel. Skull: Normal. Negative for fracture or focal lesion. Sinuses/Orbits: No acute finding. Other: None. IMPRESSION: 1.  No acute intracranial abnormality. Electronically Signed  By: Titus Dubin M.D.   On: 09/11/2018 15:34   Dg Chest Port 1 View  Result Date: 09/11/2018 CLINICAL DATA:  Altered mental status EXAM: PORTABLE CHEST 1 VIEW COMPARISON:  Aug 31, 2018. FINDINGS: There is cardiomegaly with pulmonary venous hypertension. Pacemaker leads are attached to the right atrium and right ventricle. There is interstitial edema with pleural effusions bilaterally. There is no appreciable airspace consolidation. There is aortic atherosclerosis. No adenopathy. No bone lesions. IMPRESSION: Pulmonary vascular congestion with interstitial edema and bilateral pleural effusions. The appearance is felt to be  indicative of a degree of congestive heart failure. Pacemaker leads attached to right atrium and right ventricle. Aortic Atherosclerosis (ICD10-I70.0). Electronically Signed   By: Lowella Grip III M.D.   On: 09/11/2018 14:55    Procedures Procedures (including critical care time)  Medications Ordered in ED Medications - No data to display   Initial Impression / Assessment and Plan / ED Course  I have reviewed the triage vital signs and the nursing notes.  Pertinent labs & imaging results that were available during my care of the patient were reviewed by me and considered in my medical decision making (see chart for details).  Patient presents for evaluation of altered mental status and hallucinations which have been ongoing over the past 2 to 3 days.  Wife reports patient has waxing and waning moments where he will be alert and oriented and others where he will be hallucinating and confused.  She reports he has been very generally weak and sleeping for almost 20 hours a day which is very atypical for him.  Patient's GI doctors are concerned that he may have hepatic encephalopathy, he has preemptive diagnosis of Alzheimer's but his GI doctors feel that this may be more so related to his worsening liver function.  Will check labs including ammonia and liver function as well as lactic acid, urinalysis and chest x-ray, given recent falls we will also check head CT to assess for etiology of patient's altered mental status.  He is currently alert and oriented and does not appear to be actively hallucinating and exam is otherwise reassuring.  Labs significant for leukopenia of 2.4 and hemoglobin of 10.6 which appear to be at patient's baseline, platelets of 53.  No acute electrolyte derangements requiring intervention, bilirubin is slightly elevated at 1.8, LFTs otherwise unremarkable, normal renal function.  Ammonia is not significantly elevated at 45, was 8211 days ago, despite improving ammonia  patient has had worsening confusion and hallucinations.  Lactic acid is not elevated.  Awaiting urinalysis and BNP.  Chest x-ray does show some signs of vascular congestion and pleural effusions which could be related to CHF versus liver failure.  Patient's head CT is without acute abnormality.  Discussed care with patient's wife who is in agreement with plan.  Given acute worsening of confusion and altered mental status feel patient will require admission for further evaluation.  Will consult for medicine admission.  Case discussed with Dr. Starla Link with [facility] hospitalist who will see and admit the patient requests patient be given oral lactulose and IV Lasix.  Final Clinical Impressions(s) / ED Diagnoses   Final diagnoses:  Altered mental status, unspecified altered mental status type  Hallucinations    ED Discharge Orders    None       Janet Berlin 09/11/18 [place], DO 09/12/18 [phone]

## 2018-09-11 NOTE — Telephone Encounter (Signed)
From pt

## 2018-09-11 NOTE — ED Triage Notes (Signed)
Pt arrived via EMS after wife reports ongoing intermittent audio and visual hallucinations x 2 days.

## 2018-09-11 NOTE — ED Notes (Signed)
[place] updating pt's wife

## 2018-09-12 DIAGNOSIS — G9341 Metabolic encephalopathy: Secondary | ICD-10-CM

## 2018-09-12 LAB — COMPREHENSIVE METABOLIC PANEL
ALT: 17 U/L (ref 0–44)
AST: 30 U/L (ref 15–41)
Albumin: 2.9 g/dL — ABNORMAL LOW (ref 3.5–5.0)
Alkaline Phosphatase: 104 U/L (ref 38–126)
Anion gap: 9 (ref 5–15)
BUN: 12 mg/dL (ref 8–23)
CO2: 30 mmol/L (ref 22–32)
Calcium: 8.7 mg/dL — ABNORMAL LOW (ref 8.9–10.3)
Chloride: 105 mmol/L (ref 98–111)
Creatinine, Ser: 1.07 mg/dL (ref 0.61–1.24)
GFR calc Af Amer: >60 mL/min (ref >60)
GFR calc non Af Amer: >60 mL/min (ref >60)
Glucose, Bld: 123 mg/dL — ABNORMAL HIGH (ref 70–99)
Potassium: 3.5 mmol/L (ref 3.5–5.1)
Sodium: 144 mmol/L (ref 135–145)
Total Bilirubin: 1.9 mg/dL — ABNORMAL HIGH (ref 0.3–1.2)
Total Protein: 6.4 g/dL — ABNORMAL LOW (ref 6.5–8.1)

## 2018-09-12 LAB — CBC
HCT: 34 % — ABNORMAL LOW (ref 39.0–52.0)
Hemoglobin: 10.7 g/dL — ABNORMAL LOW (ref 13.0–17.0)
MCH: 30.7 pg (ref 26.0–34.0)
MCHC: 31.5 g/dL (ref 30.0–36.0)
MCV: 97.4 fL (ref 80.0–100.0)
Platelets: 58 10*3/uL — ABNORMAL LOW (ref 150–400)
RBC: 3.49 MIL/uL — ABNORMAL LOW (ref 4.22–5.81)
RDW: 15.9 % — ABNORMAL HIGH (ref 11.5–15.5)
WBC: 2.7 10*3/uL — ABNORMAL LOW (ref 4.0–10.5)
nRBC: 0 % (ref 0.0–0.2)

## 2018-09-12 LAB — VITAMIN B12: Vitamin B-12: 3062 pg/mL — ABNORMAL HIGH (ref 180–914)

## 2018-09-12 LAB — FOLATE: Folate: 46.4 ng/mL (ref >5.9)

## 2018-09-12 LAB — GLUCOSE, CAPILLARY
Glucose-Capillary: 100 mg/dL — ABNORMAL HIGH (ref 70–99)
Glucose-Capillary: 165 mg/dL — ABNORMAL HIGH (ref 70–99)

## 2018-09-12 LAB — TSH: TSH: 3.572 u[IU]/mL (ref 0.350–4.500)

## 2018-09-12 LAB — AMMONIA: Ammonia: 49 umol/L — ABNORMAL HIGH (ref 9–35)

## 2018-09-12 LAB — HEMOGLOBIN A1C
Hgb A1c MFr Bld: 5 % (ref 4.8–5.6)
Mean Plasma Glucose: 96.8 mg/dL

## 2018-09-12 NOTE — Evaluation (Signed)
Physical Therapy Evaluation Patient Details Name: Keith [name] MRN: [id number] DOB: October 16, 1940 Today's Date: 09/12/2018   History of Present Illness  Pt is a 78 y/o male admitted secondary to hallucinations and increased weakness. Thought to have probable hepatic encephalopathy. CT of head negative for acute abnormality. Pt with recent banding procedure for esophageal varices. PMH includes dementia, HTN, DM, hepatocellular cancer, liver cirrhosis, and CHF.   Clinical Impression  Pt admitted secondary to problem above with deficits below. Pt with dementia at baseline and noted confusion during session. Ambulated in the room with min guard A and RW. Pt reports he only walks short distances at home and feels he is close to baseline. Per pt report, wife is available 24/7 to assist. If pt has necessary assist, feel pt can d/c home with [facility] services. If does not have necessary assist, will need to consider SNF. Will continue to follow acutely to maximize functional mobility independence and safety.    Follow Up Recommendations Home health PT;Supervision/Assistance - 24 hour    Equipment Recommendations  None recommended by PT    Recommendations for Other Services       Precautions / Restrictions Precautions Precautions: Fall Restrictions Weight Bearing Restrictions: No      Mobility  Bed Mobility Overal bed mobility: Needs Assistance Bed Mobility: Supine to Sit;Sit to Supine     Supine to sit: Mod assist Sit to supine: Min assist   General bed mobility comments: Mod A for trunk elevation to come to sitting. Min A for LE assist to return to supine. '  Transfers Overall transfer level: Needs assistance Equipment used: Rolling walker (2 wheeled) Transfers: Sit to/from Stand Sit to Stand: Min guard         General transfer comment: Min guard for steadying assist.   Ambulation/Gait Ambulation/Gait assistance: Min guard Gait Distance (Feet): 15 Feet Assistive device:  Rolling walker (2 wheeled) Gait Pattern/deviations: Step-through pattern;Decreased stride length;Decreased weight shift to right Gait velocity: Decreased    General Gait Details: Slow, slightly antalgic gait. Pt reports R knee felt like it would buckle slightly, however, reports this is baseline. Min guard for safety. Pt reports ambulating only short distances at home.   Stairs            Wheelchair Mobility    Modified Rankin (Stroke Patients Only)       Balance Overall balance assessment: Needs assistance Sitting-balance support: No upper extremity supported;Feet supported Sitting balance-Leahy Scale: Fair     Standing balance support: Bilateral upper extremity supported;During functional activity Standing balance-Leahy Scale: Poor Standing balance comment: Reliant on BUE support                              Pertinent Vitals/Pain Pain Assessment: Faces Faces Pain Scale: Hurts little more Pain Location: R knee Pain Descriptors / Indicators: Aching Pain Intervention(s): Limited activity within patient's tolerance;Monitored during session;Repositioned    Home Living Family/patient expects to be discharged to:: Private residence Living Arrangements: Spouse/significant other Available Help at Discharge: Family;Available 24 hours/day Type of Home: House Home Access: Stairs to enter Entrance Stairs-Rails: Right;Left;Can reach both Entrance Stairs-Number of Steps: 1 Home Layout: One level Home Equipment: Grab bars - toilet;Grab bars - tub/shower;Cane - single point;Other (comment);Walker - 2 wheels(lift chair )      Prior Function Level of Independence: Independent with assistive device(s)         Comments: Uses RW for ambulation. Reports indep with  all ADLs. Wife drives     Hand Dominance        Extremity/Trunk Assessment   Upper Extremity Assessment Upper Extremity Assessment: Defer to OT evaluation    Lower Extremity Assessment Lower  Extremity Assessment: Generalized weakness;RLE deficits/detail RLE Deficits / Details: Reports R knee pain at baseline and reports sometimes it feels like it is "giving way" on him.     Cervical / Trunk Assessment Cervical / Trunk Assessment: Kyphotic  Communication   Communication: HOH  Cognition Arousal/Alertness: Awake/alert Behavior During Therapy: WFL for tasks assessed/performed Overall Cognitive Status: History of cognitive impairments - at baseline                                 General Comments: Dementia at baseline. Pt did not know what he was in the hospital for and was requesting MD to come. RN reporting MD had already seen the pt. Reports it was may of 2002.       General Comments      Exercises     Assessment/Plan    PT Assessment Patient needs continued PT services  PT Problem List Decreased strength;Decreased balance;Decreased mobility;Decreased cognition;Decreased knowledge of use of DME;Decreased safety awareness;Decreased knowledge of precautions       PT Treatment Interventions DME instruction;Gait training;Stair training;Functional mobility training;Therapeutic activities;Therapeutic exercise;Balance training;Patient/family education    PT Goals (Current goals can be found in the Care Plan section)  Acute Rehab PT Goals Patient Stated Goal: "to find out what is going on with me"  PT Goal Formulation: With patient Time For Goal Achievement: 09/26/18 Potential to Achieve Goals: Good    Frequency Min 3X/week   Barriers to discharge        Co-evaluation               AM-PAC PT "6 Clicks" Mobility  Outcome Measure Help needed turning from your back to your side while in a flat bed without using bedrails?: A Little Help needed moving from lying on your back to sitting on the side of a flat bed without using bedrails?: A Lot Help needed moving to and from a bed to a chair (including a wheelchair)?: A Little Help needed standing up  from a chair using your arms (e.g., wheelchair or bedside chair)?: A Little Help needed to walk in hospital room?: A Little Help needed climbing 3-5 steps with a railing? : A Lot 6 Click Score: 16    End of Session Equipment Utilized During Treatment: Gait belt Activity Tolerance: Patient tolerated treatment well Patient left: in bed;with call bell/phone within reach;with bed alarm set Nurse Communication: Mobility status;Other (comment)(pt asking about when MD is coming) PT Visit Diagnosis: Unsteadiness on feet (R26.81);Muscle weakness (generalized) (M62.81)    Time: 8657-8469 PT Time Calculation (min) (ACUTE ONLY): 26 min   Charges:   PT Evaluation $PT Eval Moderate Complexity: 1 Mod PT Treatments $Gait Training: 8-22 mins        Leighton Ruff, PT, DPT  Acute Rehabilitation Services  Pager: [phone] Office: [phone]   Rudean Hitt 09/12/2018, 12:35 PM

## 2018-09-12 NOTE — Discharge Summary (Signed)
Physician Discharge Summary  Keith [name] JJK:[id number] DOB: Dec 20, 1940 DOA: 09/11/2018  PCP: [facility]  Admit date: 09/11/2018 Discharge date: 09/12/2018  Admitted From: Home Disposition: Home   Recommendations for Outpatient Follow-up:  1. Follow up with PCP in 1-2 weeks 2. Please obtain BMP/CBC in one week 3. Please follow up on the following pending results:  Home Health: No  Equipment/Devices: None Discharge Condition: Stable  CODE STATUS: DNR   Brief/Interim Summary: Keith [name] is a 78 y.o. male with medical history significant of TIA, hepatocellular cancer status post partial hepatectomy, cirrhosis of liver due to NASH followed by gastroenterology at [facility], esophageal varices status post recent banding few weeks ago at [facility], chronic thrombocytopenia, hypertension, hyperlipidemia, diabetes mellitus, chronic combined systolic and diastolic heart failure, moderate to severe aortic stenosis, secondary AV block status post pacemaker, morbid obesity, obstructive sleep apnea, dementia presented with hallucinations for the last few days.  Patient is a poor historian and does not provide appropriate history.  I spoke to [name]/wife on phone and as per her, patient has been having worsening intermittent hallucinations, mostly visual over the last few days happening mostly at nights.  Patient has been stating that he is being seeing things that are not there.  As per her, he is getting increasingly weak and sleeping up to almost 20 hours a day.  Apparently, he has.'s when he is lucid and his typical self and other times he has hallucinations.  He has been become increasingly weak since his last esophageal variceal banding at [facility] few weeks ago.  After her last recent esophageal banding at [facility], patient/family were told that they should have a palliative care discussion as well.  No fever, vomiting, increasing abdominal size, dysuria, hematuria, worsening shortness of breath  reported.  No history of seizures reported.  Patient also has history of Alzheimer's dementia and wife was also concerned if this is worsening of the dementia.  She contacted patient's GI doctors at [facility] who recommended that patient be evaluated to rule out hepatic encephalopathy.  Of note, patient does not take lactulose at home because it gives him diarrhea. There is no recent history of falls. In ED Blood work revealed ammonia 45 with total bilirubin of 1.8, platelets of 53.  CT of the brain was negative for any acute abnormality. Chest x-ray showed pulmonary vascular congestion with bilateral pleural effusions.  Hospitalist service was called to evaluate the patient.  I asked the ED provider to give lactulose and Lasix to the patient.  Patient admitted as above with acute episode of delirium with essentially unknown cause.  Discussion with wife at admission yesterday per admitting PCP and again today by myself indicate patient has episodes of hallucinations and what appear to be delirium most notably at night of these also occur during the day.  Unremarkable, patient refused lactulose in the ED as well as on the floor, resolved this morning back to baseline without intervention essentially in the hospital.  Given patient did not take lactulose, ammonia is within normal limits and his mental status resolve this is likely not GI in nature.  Is at very high risk for polypharmacy given his multiple medications as well as poor compliance per wife over the phone.  She does follow with PCP, neurology as well as GI -she states the patient has questionably been diagnosed with Alzheimer's although she is somewhat hesitant to agree with this diagnosis she does note the patient's memory has been worse over the past few  years.  We discussed that patient may very well have dementia with episodes of delirium due to advanced age, multiple chronic comorbid conditions as well as worsening dementia.  We discussed possible need  for mood stabilizers, Seroquel was discussed at length between myself and Baker Janus (patient's wife).  At this point given patient's lengthy medication list with multiple QT prolongation medications will hold off on further prescriptions at discharge and request patient to see neurologist within the next 5 days for further evaluation and treatment of delirium/mood changes in the setting of likely dementia.  Again given the patient's resolution without administration of lactulose the normal ammonia levels here we do not think this is GI in nature.  Given patient's chronic worsening requiring somewhat high risk procedures in the near future with repeat EGD and likely variceal banding as well as needing a TAVR for his aortic stenosis as above we discussed palliative care discussion which is also been brought up at [facility] -of asked palliative care to follow-up with patient's wife when available.  Otherwise stable and agreeable for discharge, patient's wife over the phone indicates the patient is back to baseline agreeable to accept the patient back home.  Discharge Diagnoses:  Principal Problem:   Acute metabolic encephalopathy Active Problems:   Hypertension   Thrombocytopenia, unspecified (HCC)   Hyperlipidemia   Cirrhosis of liver (HCC)   Diabetes mellitus type 2 in obese (HCC)   Hallucination   Hepatic encephalopathy (HCC)   Hallucinations  Acute metabolic encephalopathy/hallucinations, questionable delirium versus worsening dementia, POA, resolved Possibly polypharmacy in nature Unlikely hepatic encephalopathy  -Patient appears to have fluctuating episodes of mental status per wife typically resolving within a few hours however this episode lasted nearly 48 hours which is atypical and the reason for admission -Essentially normal, patient resolved without lactulose administration as he continued to refuse lactulose even while altered from baseline -Patient now back to baseline ANO x4, somewhat sluggish  with answers this morning but this is apparently his baseline per wife over the phone. -Recommending further discussion with outpatient neurology for possible stabilizer, would recommend Seroquel, however will hold off on administration or prescription now given patient's multiple home medications with multiple QT prolonging medications already administered -Patient is already on Namenda as well as amitriptyline -defer to neurology as these may need adjustment -Majority of patient's home medications were held at admission, is certainly reasonable given his medication list that decreasing or discontinuing some of his home medications, most notably tramadol, Lyrica might improve his mental status -Outpatient follow-up with [facility] gastroenterology.  Continue  to recommend compliance with rifaximin, lactulose, and spironolactone/lasix as scheduled at home.  Acute on chronic combined systolic and diastolic heart failure, not in acute exacerbation Moderate to severe aortic stenosis stable -TEE on 04/16/2018 had shown EF of 35 to 40% with global hypokinesis -Patient appears euvolemic, discharge home, continue diuretics as above -Patient follows up with Dr. Einar Gip -Plan for TAVR as above  Pancytopenia, likely secondary to above ,POA -Likely in the setting of cirrhosis, Karlene Lineman -Follow labs with PCP -Reportedly iron deficiency anemia likely on chronic anemia of chronic disease -GI previously recommending IV iron, will defer to their expertise as we do not have iron lab level here -B12 and folate elevated/WNL respectively  Diabetes mellitus type 2 poorly controlled with hyperglycemia -Continue Lantus along with CBGs with SSI.  Hypertension -Monitor blood pressure.  Continue Lasix and spironolactone.  Dyslipidemia -Continue statin  Morbid obesity -Outpatient follow-up  Goals of care -Lengthy discussion with wife as  above, given patient continues to clinically worsen with ongoing multiple episodes of  delirium, worsening dementia, need for somewhat aggressive as above including EGD with banding, TAVR palliative care has been consulted.  Unclear if palliative is here over the weekend, but will make them aware that patient's wife would be POA and primary contact for any further discussion about possible palliative care near future.   Discharge Instructions Follow-up with PCP, neurology, GI physicians as discussed.  Would prefer follow-up with neurology within the next 5 to 7 days further evaluate patient's transient episodes of delirium which appear to be somewhat sporadic and not in relation to time of day, medication or any other activity.  Allergies as of 09/12/2018      Reactions   Contrast Media [iodinated Diagnostic Agents] Rash   Cannot have contrast   Fentanyl Itching, Rash   Gadolinium Rash   Iodine Rash   Merbromin Rash   Other Rash   "Opioids"      Medication List    STOP taking these medications   acetaminophen 500 MG tablet Commonly known as:  TYLENOL     TAKE these medications   albuterol 108 (90 Base) MCG/ACT inhaler Commonly known as:  VENTOLIN HFA Inhale 2 puffs into the lungs every 4 (four) hours as needed for wheezing or shortness of breath.   amitriptyline 25 MG tablet Commonly known as:  ELAVIL Take 25 mg by mouth at bedtime.   aspirin 81 MG EC tablet Take 1 tablet (81 mg total) by mouth daily.   cyanocobalamin 1000 MCG/ML injection Commonly known as:  (VITAMIN B-12) Inject 1,000 mcg into the muscle every 30 (thirty) days.   finasteride 5 MG tablet Commonly known as:  PROSCAR Take 5 mg by mouth every evening.   folic acid 1 MG tablet Commonly known as:  FOLVITE Take 1 mg by mouth daily.   furosemide 20 MG tablet Commonly known as:  LASIX Take 20 mg by mouth daily.   hydrocerin Crea Apply 1 application topically daily as needed (dry skin).   insulin aspart 100 UNIT/ML injection Commonly known as:  novoLOG Inject 5-30 Units into the skin See  admin instructions. Inject 5-30 units into the skin three times a day before meals, per sliding scale   insulin glargine 100 UNIT/ML injection Commonly known as:  LANTUS Inject 30 Units into the skin 2 (two) times daily. Inject 30 units into the skin 2 times a day- 10 AM and bedtime   memantine 10 MG tablet Commonly known as:  NAMENDA Take 20 mg by mouth at bedtime.   pantoprazole 40 MG tablet Commonly known as:  PROTONIX Take 40 mg by mouth daily.   polyvinyl alcohol 1.4 % ophthalmic solution Commonly known as:  LIQUIFILM TEARS Place 1 drop into both eyes 2 (two) times daily as needed (dry eyes).   pregabalin 300 MG capsule Commonly known as:  LYRICA Take 300 mg by mouth 2 (two) times daily.   rifaximin 550 MG Tabs tablet Commonly known as:  XIFAXAN Take 550 mg by mouth 2 (two) times daily.   simvastatin 20 MG tablet Commonly known as:  ZOCOR Take 10 mg by mouth every evening.   spironolactone 25 MG tablet Commonly known as:  ALDACTONE Take 2 tablets (50 mg total) by mouth daily.   tamsulosin 0.4 MG Caps capsule Commonly known as:  FLOMAX Take 0.4 mg by mouth every evening.   traMADol 50 MG tablet Commonly known as:  ULTRAM Take 1 tablet (50 mg  total) by mouth every 12 (twelve) hours as needed for moderate pain. What changed:    when to take this  reasons to take this   Vitamin D3 50 MCG (2000 UT) Tabs Take 2,000 Units by mouth daily.       Allergies  Allergen Reactions  . Contrast Media [Iodinated Diagnostic Agents] Rash    Cannot have contrast  . Fentanyl Itching and Rash  . Gadolinium Rash  . Iodine Rash  . Merbromin Rash  . Other Rash    "Opioids"    Procedures/Studies: Dg Chest 1 View  Result Date: 08/31/2018 CLINICAL DATA:  Fall. Weakness. EXAM: CHEST  1 VIEW COMPARISON:  Chest radiographs 04/14/2018 and CTA 04/22/2018 FINDINGS: A dual lead pacemaker remains in place. The cardiac silhouette remains enlarged. Aortic atherosclerosis is noted.  There is increased pulmonary vascular congestion with increased interstitial markings diffusely. Asymmetric opacity is noted in the right perihilar and infrahilar regions. No sizable pleural effusion or pneumothorax is identified. No acute osseous abnormality is seen. IMPRESSION: 1. Cardiomegaly with increased pulmonary vascular congestion and interstitial densities compatible with edema. 2. Right perihilar/infrahilar opacity which may reflect asymmetric edema or pneumonia. Electronically Signed   By: Logan Bores M.D.   On: 08/31/2018 13:02   Ct Head Wo Contrast  Result Date: 09/11/2018 CLINICAL DATA:  Altered mental status with lucent lesions for the past 2 days. EXAM: CT HEAD WITHOUT CONTRAST TECHNIQUE: Contiguous axial images were obtained from the base of the skull through the vertex without intravenous contrast. COMPARISON:  CT head dated Aug 31, 2018. FINDINGS: Brain: No evidence of acute infarction, hemorrhage, hydrocephalus, extra-axial collection or mass lesion/mass effect. Stable atrophy and chronic microvascular ischemic changes. Old tiny lacunar infarct in the left thalamus again noted. Old small right occipital lobe infarct again noted. Vascular: Calcified atherosclerosis at the skullbase. No hyperdense vessel. Skull: Normal. Negative for fracture or focal lesion. Sinuses/Orbits: No acute finding. Other: None. IMPRESSION: 1.  No acute intracranial abnormality. Electronically Signed   By: Titus Dubin M.D.   On: 09/11/2018 15:34   Ct Head Wo Contrast  Result Date: 08/31/2018 CLINICAL DATA:  78 year old male with a history of occipital headache EXAM: CT HEAD WITHOUT CONTRAST TECHNIQUE: Contiguous axial images were obtained from the base of the skull through the vertex without intravenous contrast. COMPARISON:  None. FINDINGS: Brain: No acute intracranial hemorrhage. No midline shift or mass effect. Gray-white differentiation maintained. Senescent volume loss. Patchy hypodensity in the  periventricular white matter. Unremarkable appearance of the ventricular system. Vascular: Intracranial atherosclerosis Skull: No acute fracture.  No aggressive bone lesion identified. Sinuses/Orbits: Unremarkable appearance of the orbits. Mastoid air cells clear. No middle ear effusion. No significant sinus disease. Other: Nasal septal defect. IMPRESSION: Negative for acute intracranial abnormality. Senescent volume loss and periventricular white matter disease with associated intracranial atherosclerosis. Septal nasal defect, can be seen with the setting of longstanding/chronic use of vaso constrictive substances. Electronically Signed   By: Corrie Mckusick D.O.   On: 08/31/2018 14:04   Ct Cervical Spine Wo Contrast  Result Date: 08/31/2018 CLINICAL DATA:  Midline occipital headache. Fall without loss of consciousness. Recent weakness. EXAM: CT CERVICAL SPINE WITHOUT CONTRAST TECHNIQUE: Multidetector CT imaging of the cervical spine was performed without intravenous contrast. Multiplanar CT image reconstructions were also generated. COMPARISON:  Cervical spine CT 07/09/2012. FINDINGS: Alignment: Straightening without focal angulation or listhesis. Skull base and vertebrae: No evidence of acute cervical spine fracture or traumatic subluxation. Soft tissues and spinal canal: No prevertebral  fluid or swelling. No visible canal hematoma. Disc levels: There is no large disc herniation or significant spinal stenosis. The disc spaces are largely preserved. There is minimal uncinate spurring and facet hypertrophy. Upper chest: Small bilateral pleural effusions are noted. Patient has a pacemaker and emphysematous changes within the visualized lungs. Other: Bilateral carotid atherosclerosis. IMPRESSION: 1. No evidence of acute cervical spine fracture, traumatic subluxation or static signs of instability. 2. Carotid atherosclerosis, emphysema and bilateral pleural effusions noted. Electronically Signed   By: Richardean Sale M.D.   On: 08/31/2018 14:03   Dg Chest Port 1 View  Result Date: 09/11/2018 CLINICAL DATA:  Altered mental status EXAM: PORTABLE CHEST 1 VIEW COMPARISON:  Aug 31, 2018. FINDINGS: There is cardiomegaly with pulmonary venous hypertension. Pacemaker leads are attached to the right atrium and right ventricle. There is interstitial edema with pleural effusions bilaterally. There is no appreciable airspace consolidation. There is aortic atherosclerosis. No adenopathy. No bone lesions. IMPRESSION: Pulmonary vascular congestion with interstitial edema and bilateral pleural effusions. The appearance is felt to be indicative of a degree of congestive heart failure. Pacemaker leads attached to right atrium and right ventricle. Aortic Atherosclerosis (ICD10-I70.0). Electronically Signed   By: Lowella Grip III M.D.   On: 09/11/2018 14:55   Dg Knee Complete 4 Views Right  Result Date: 08/31/2018 CLINICAL DATA:  Fall, knee pain. EXAM: RIGHT KNEE - COMPLETE 4+ VIEW COMPARISON:  None. FINDINGS: Osseous alignment is normal. No fracture line or displaced fracture fragment seen. No significant degenerative change. Questionable small joint effusion within the suprapatellar bursa. Prominent atherosclerosis of the femoral, popliteal and calf arteries. IMPRESSION: 1. No osseous fracture or dislocation. 2. Questionable small joint effusion. 3. Atherosclerosis. Electronically Signed   By: Franki Cabot M.D.   On: 08/31/2018 12:58     Subjective:   Discharge Exam: Vitals:   09/12/18 1009 09/12/18 1158  BP: 103/69 109/71  Pulse: 79 88  Resp: 20 19  Temp: 97.9 F (36.6 C) 98 F (36.7 C)  SpO2: 95% 97%   Vitals:   09/12/18 0425 09/12/18 0456 09/12/18 1009 09/12/18 1158  BP: 103/67  103/69 109/71  Pulse: 80  79 88  Resp: 15  20 19   Temp: 98.3 F (36.8 C)  97.9 F (36.6 C) 98 F (36.7 C)  TempSrc: Oral  Oral Oral  SpO2: 97%  95% 97%  Weight:  (!) 136.3 kg    Height:        General: Pt is alert,  awake, not in acute distress Cardiovascular: RRR, S1/S2 +, no rubs, no gallops Respiratory: CTA bilaterally, no wheezing, no rhonchi Abdominal: Soft, NT, ND, bowel sounds + Extremities: no edema, no cyanosis    The results of significant diagnostics from this hospitalization (including imaging, microbiology, ancillary and laboratory) are listed below for reference.     Microbiology: Recent Results (from the past 240 hour(s))  SARS Coronavirus 2 (CEPHEID - Performed in [place] hospital lab), Hosp Order     Status: None   Collection Time: 09/11/18  3:39 PM  Result Value Ref Range Status   SARS Coronavirus 2 NEGATIVE NEGATIVE Final    Comment: (NOTE) If result is NEGATIVE SARS-CoV-2 target nucleic acids are NOT DETECTED. The SARS-CoV-2 RNA is generally detectable in upper and lower  respiratory specimens during the acute phase of infection. The lowest  concentration of SARS-CoV-2 viral copies this assay can detect is 250  copies / mL. A negative result does not preclude SARS-CoV-2 infection  and  should not be used as the sole basis for treatment or other  patient management decisions.  A negative result may occur with  improper specimen collection / handling, submission of specimen other  than nasopharyngeal swab, presence of viral mutation(s) within the  areas targeted by this assay, and inadequate number of viral copies  (<250 copies / mL). A negative result must be combined with clinical  observations, patient history, and epidemiological information. If result is POSITIVE SARS-CoV-2 target nucleic acids are DETECTED. The SARS-CoV-2 RNA is generally detectable in upper and lower  respiratory specimens dur ing the acute phase of infection.  Positive  results are indicative of active infection with SARS-CoV-2.  Clinical  correlation with patient history and other diagnostic information is  necessary to determine patient infection status.  Positive results do  not rule out  bacterial infection or co-infection with other viruses. If result is PRESUMPTIVE POSTIVE SARS-CoV-2 nucleic acids MAY BE PRESENT.   A presumptive positive result was obtained on the submitted specimen  and confirmed on repeat testing.  While 2019 novel coronavirus  (SARS-CoV-2) nucleic acids may be present in the submitted sample  additional confirmatory testing may be necessary for epidemiological  and / or clinical management purposes  to differentiate between  SARS-CoV-2 and other Sarbecovirus currently known to infect humans.  If clinically indicated additional testing with an alternate test  methodology [phone]) is advised. The SARS-CoV-2 RNA is generally  detectable in upper and lower respiratory sp ecimens during the acute  phase of infection. The expected result is Negative. Fact Sheet for Patients:  StrictlyIdeas.no Fact Sheet for Healthcare Providers: BankingDealers.co.za This test is not yet approved or cleared by the [place] FDA and has been authorized for detection and/or diagnosis of SARS-CoV-2 by FDA under an Emergency Use Authorization (EUA).  This EUA will remain in effect (meaning this test can be used) for the duration of the COVID-19 declaration under Section 564(b)(1) of the Act, 21 U.S.C. section 360bbb-3(b)(1), unless the authorization is terminated or revoked sooner. Performed at [facility], [place] [address] [place], [place] [address]   Urine culture     Status: None (Preliminary result)   Collection Time: 09/11/18  5:32 PM  Result Value Ref Range Status   Specimen Description URINE, RANDOM  Final   Special Requests NONE  Final   Culture   Final    CULTURE REINCUBATED FOR BETTER GROWTH Performed at [facility], [place] [address], [place], [place] [address]    Report Status PENDING  Incomplete     Labs: BNP (last 3 results) Recent Labs    04/16/18 0423 08/31/18 1226 09/11/18 1501  BNP  96.5 313.3* 950.9*   Basic Metabolic Panel: Recent Labs  Lab 09/11/18 1430 09/12/18 0446  NA 141 144  K 4.0 3.5  CL 106 105  CO2 25 30  GLUCOSE 127* 123*  BUN 11 12  CREATININE 0.93 1.07  CALCIUM 8.8* 8.7*   Liver Function Tests: Recent Labs  Lab 09/11/18 1430 09/12/18 0446  AST 29 30  ALT 15 17  ALKPHOS 96 104  BILITOT 1.8* 1.9*  PROT 6.3* 6.4*  ALBUMIN 2.9* 2.9*   No results for input(s): LIPASE, AMYLASE in the last 168 hours. Recent Labs  Lab 09/11/18 1430 09/12/18 0446  AMMONIA 45* 49*   CBC: Recent Labs  Lab 09/11/18 1430 09/12/18 0446  WBC 2.4* 2.7*  NEUTROABS 1.6*  --   HGB 10.6* 10.7*  HCT 33.7* 34.0*  MCV 99.4 97.4  PLT 53* 58*   Cardiac Enzymes: No results for input(s): CKTOTAL, CKMB, CKMBINDEX, TROPONINI in the last 168 hours. BNP: Invalid input(s): POCBNP CBG: Recent Labs  Lab 09/11/18 1431 09/11/18 2240 09/12/18 0627 09/12/18 1159  GLUCAP 115* 118* 100* 165*   D-Dimer No results for input(s): DDIMER in the last 72 hours. Hgb A1c Recent Labs    09/12/18 0446  HGBA1C 5.0   Lipid Profile No results for input(s): CHOL, HDL, LDLCALC, TRIG, CHOLHDL, LDLDIRECT in the last 72 hours. Thyroid function studies Recent Labs    09/12/18 0446  TSH 3.572   Anemia work up Recent Labs    09/12/18 0446  VITAMINB12 3,062*  FOLATE 46.4   Urinalysis    Component Value Date/Time   COLORURINE STRAW (A) 09/11/2018 1732   APPEARANCEUR CLEAR 09/11/2018 1732   LABSPEC 1.005 09/11/2018 1732   PHURINE 7.0 09/11/2018 1732   GLUCOSEU NEGATIVE 09/11/2018 1732   HGBUR NEGATIVE 09/11/2018 1732   BILIRUBINUR NEGATIVE 09/11/2018 1732   KETONESUR NEGATIVE 09/11/2018 1732   PROTEINUR NEGATIVE 09/11/2018 1732   UROBILINOGEN 0.2 12/03/2012 1338   NITRITE NEGATIVE 09/11/2018 1732   LEUKOCYTESUR NEGATIVE 09/11/2018 1732   Sepsis Labs Invalid input(s): PROCALCITONIN,  WBC,  LACTICIDVEN Microbiology Recent Results (from the past 240 hour(s))   SARS Coronavirus 2 (CEPHEID - Performed in [place] hospital lab), Hosp Order     Status: None   Collection Time: 09/11/18  3:39 PM  Result Value Ref Range Status   SARS Coronavirus 2 NEGATIVE NEGATIVE Final    Comment: (NOTE) If result is NEGATIVE SARS-CoV-2 target nucleic acids are NOT DETECTED. The SARS-CoV-2 RNA is generally detectable in upper and lower  respiratory specimens during the acute phase of infection. The lowest  concentration of SARS-CoV-2 viral copies this assay can detect is 250  copies / mL. A negative result does not preclude SARS-CoV-2 infection  and should not be used as the sole basis for treatment or other  patient management decisions.  A negative result may occur with  improper specimen collection / handling, submission of specimen other  than nasopharyngeal swab, presence of viral mutation(s) within the  areas targeted by this assay, and inadequate number of viral copies  (<250 copies / mL). A negative result must be combined with clinical  observations, patient history, and epidemiological information. If result is POSITIVE SARS-CoV-2 target nucleic acids are DETECTED. The SARS-CoV-2 RNA is generally detectable in upper and lower  respiratory specimens dur ing the acute phase of infection.  Positive  results are indicative of active infection with SARS-CoV-2.  Clinical  correlation with patient history and other diagnostic information is  necessary to determine patient infection status.  Positive results do  not rule out bacterial infection or co-infection with other viruses. If result is PRESUMPTIVE POSTIVE SARS-CoV-2 nucleic acids MAY BE PRESENT.   A presumptive positive result was obtained on the submitted specimen  and confirmed on repeat testing.  While 2019 novel coronavirus  (SARS-CoV-2) nucleic acids may be present in the submitted sample  additional confirmatory testing may be necessary for epidemiological  and / or clinical management  purposes  to differentiate between  SARS-CoV-2 and other Sarbecovirus currently known to infect humans.  If clinically indicated additional testing with an alternate test  methodology [phone]) is advised. The SARS-CoV-2 RNA is generally  detectable in upper and lower respiratory sp ecimens during the acute  phase of infection. The expected result is Negative. Fact Sheet for Patients:  StrictlyIdeas.no Fact Sheet for Healthcare Providers: BankingDealers.co.za This test is not yet approved or cleared by the [place] FDA and has been authorized for detection and/or diagnosis of SARS-CoV-2 by FDA under an Emergency Use Authorization (EUA).  This EUA will remain in effect (meaning this test can be used) for the duration of the COVID-19 declaration under Section 564(b)(1) of the Act, 21 U.S.C. section 360bbb-3(b)(1), unless the authorization is terminated or revoked sooner. Performed at [facility], [place] [address], [place], [place] [address]   Urine culture     Status: None (Preliminary result)   Collection Time: 09/11/18  5:32 PM  Result Value Ref Range Status   Specimen Description URINE, RANDOM  Final   Special Requests NONE  Final   Culture   Final    CULTURE REINCUBATED FOR BETTER GROWTH Performed at [facility], [place] [address] [place], [place] [address]    Report Status PENDING  Incomplete     Time coordinating discharge: Over 30 minutes  SIGNED:   Little Ishikawa, DO [facility] 09/12/2018, 12:27 PM Pager   If 7PM-7AM, please contact night-coverage [email or web address] Password [email or web address]

## 2018-09-12 NOTE — Plan of Care (Signed)
  Problem: Education: Goal: Knowledge of General Education information will improve Description Including pain rating scale, medication(s)/side effects and non-pharmacologic comfort measures Outcome: Progressing   Problem: Health Behavior/Discharge Planning: Goal: Ability to manage health-related needs will improve Outcome: Progressing   Problem: Clinical Measurements: Goal: Ability to maintain clinical measurements within normal limits will improve Outcome: Progressing Goal: Will remain free from infection Outcome: Progressing Goal: Diagnostic test results will improve Outcome: Progressing Goal: Respiratory complications will improve Outcome: Progressing Goal: Cardiovascular complication will be avoided Outcome: Progressing   Problem: Activity: Goal: Risk for activity intolerance will decrease Outcome: Progressing   Problem: Nutrition: Goal: Adequate nutrition will be maintained Outcome: Progressing   Problem: Coping: Goal: Level of anxiety will decrease Outcome: Progressing   Problem: Elimination: Goal: Will not experience complications related to bowel motility Outcome: Progressing Goal: Will not experience complications related to urinary retention Outcome: Progressing   Problem: Pain Managment: Goal: General experience of comfort will improve Outcome: Progressing   Problem: Safety: Goal: Ability to remain free from injury will improve Outcome: Progressing   Problem: Skin Integrity: Goal: Risk for impaired skin integrity will decrease Outcome: Progressing   Ival Bible, BSN, RN

## 2018-09-12 NOTE — Progress Notes (Signed)
Report received from [place] at 2202. Pt on unit at 2230, AOx2, calm,but not hungry now. Diet ordering process explained to pt and menu provided.

## 2018-09-13 LAB — URINE CULTURE: Culture: >=100000 — AB

## 2018-09-25 IMAGING — CR DG CHEST 2V
2 series · 2 of 2 positions shown · non-contrast
Comparison: November 23, 2017

CLINICAL DATA: Cough and hypotension

EXAM:
CHEST - 2 VIEW

[chest lat]
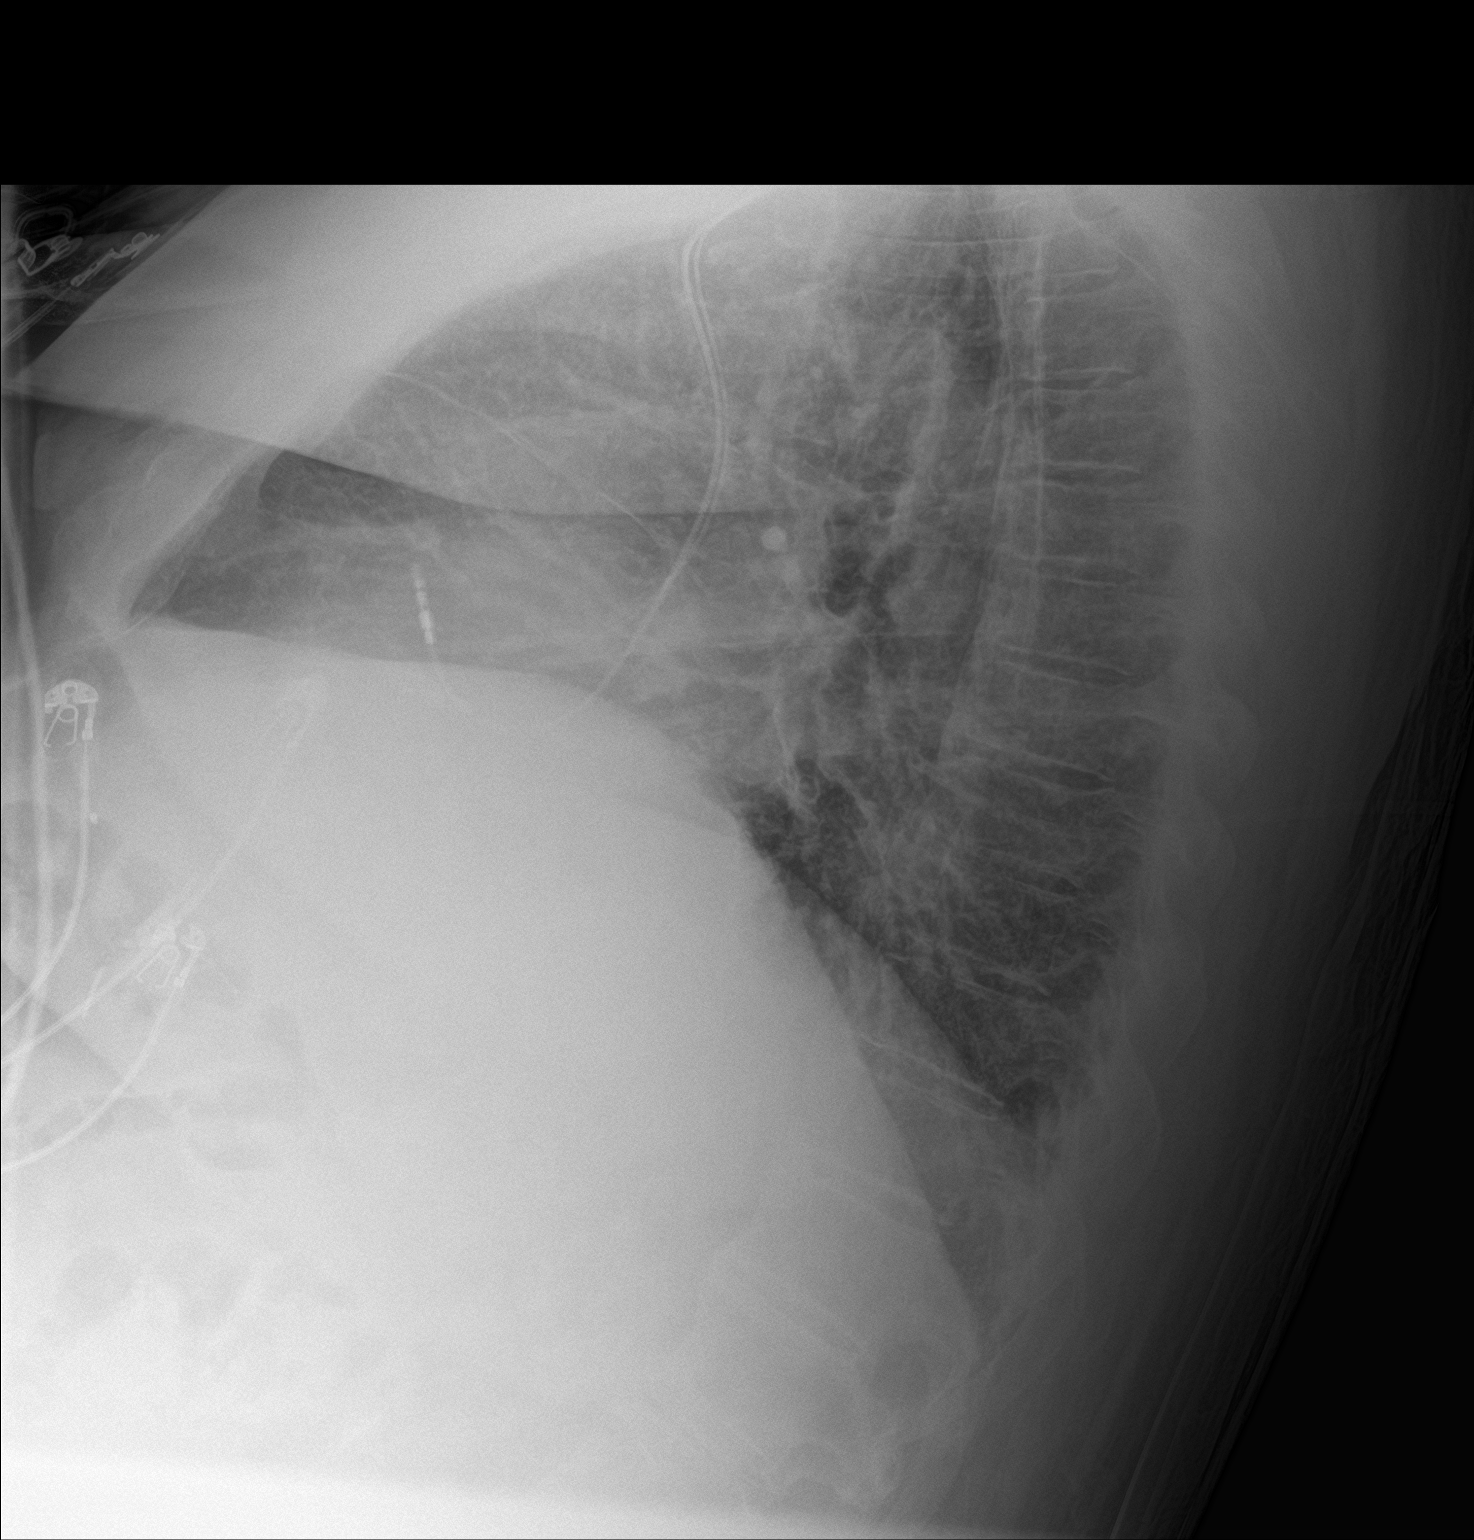

[chest ap]
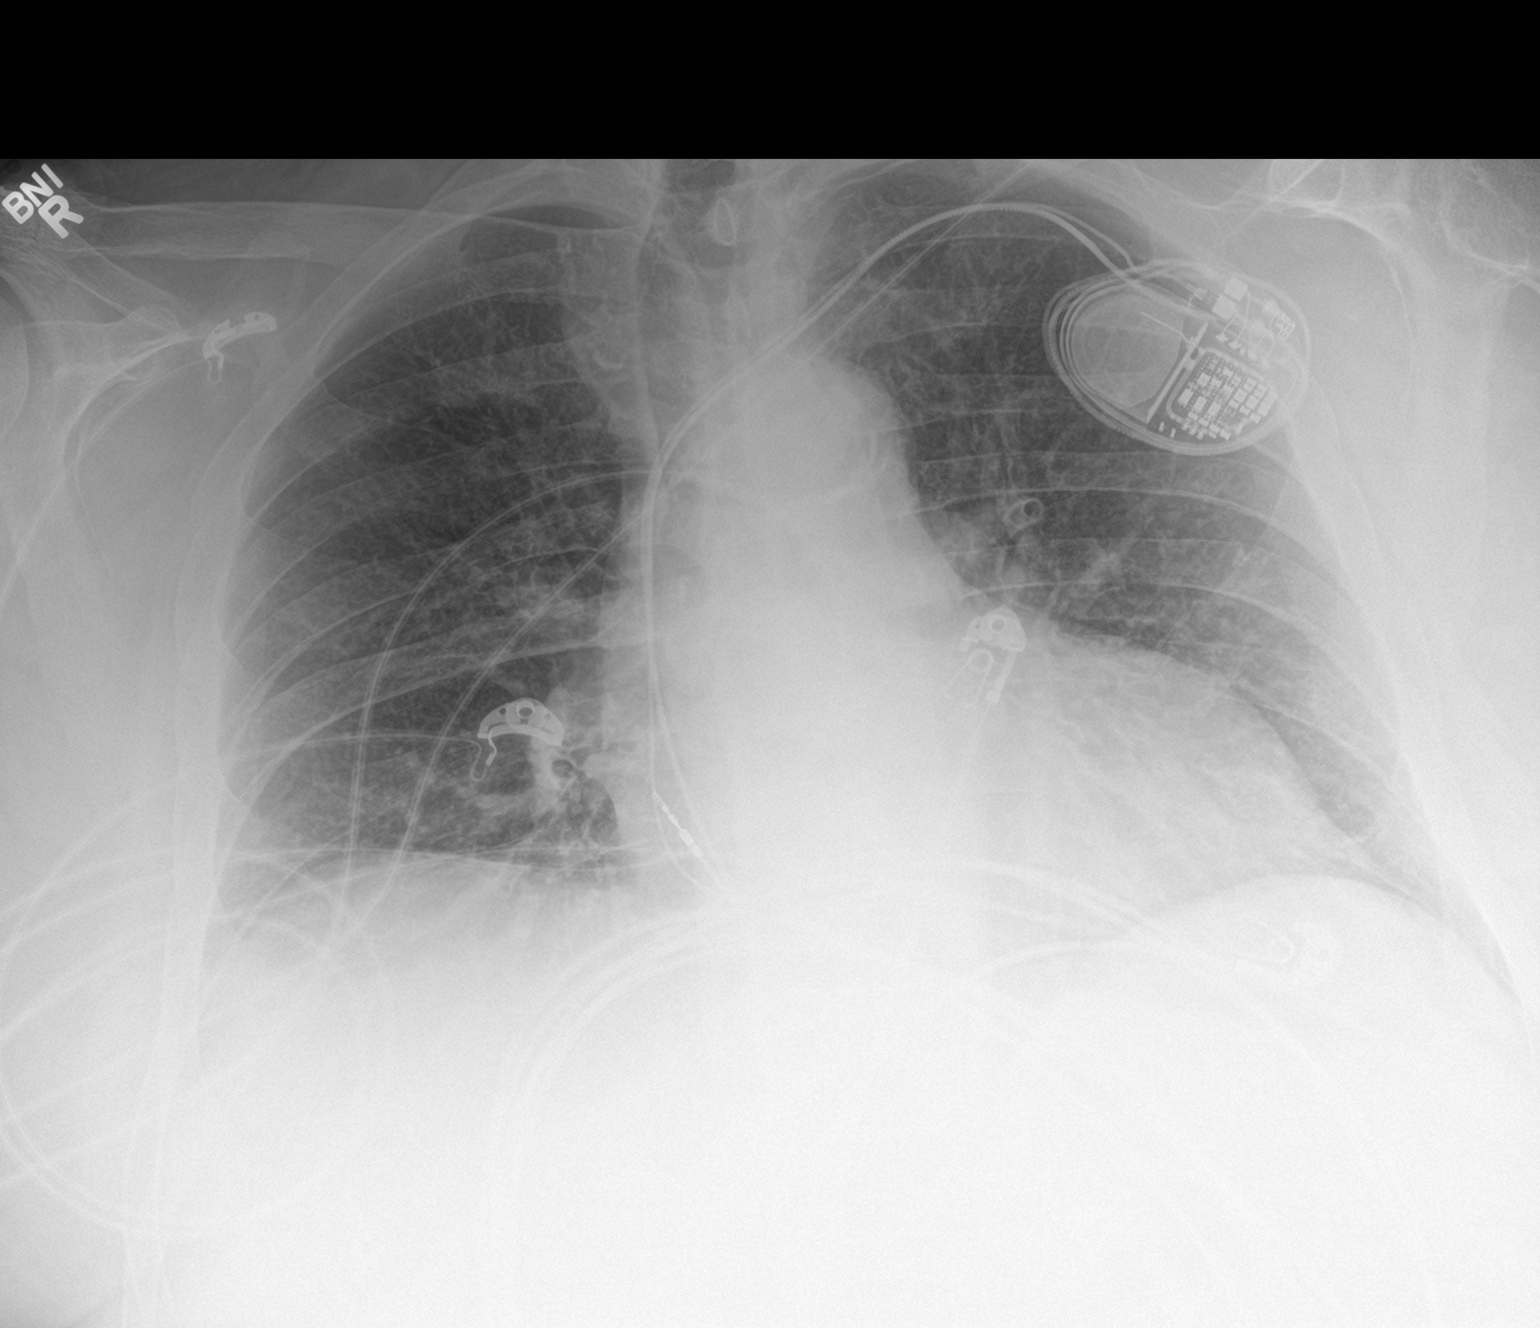

[2 of 2 positions shown; findings below may reference images not displayed]

FINDINGS: The heart size and mediastinal contours are stable. The heart size
is enlarged. Cardiac pacemaker is unchanged. There is mild diffuse
increased bilateral pulmonary interstitium unchanged. The lung
volumes are low. There is no focal pneumonia or pleural effusion.
The visualized skeletal structures are stable.
IMPRESSION: Cardiomegaly with mild interstitial edema.

## 2018-09-28 ENCOUNTER — Telehealth: Payer: Self-pay | Admitting: Internal Medicine

## 2018-09-28 NOTE — Telephone Encounter (Signed)
Called patient's wife back to let her know that our Referral Intake person had not heard back from the [place] Worker - Kathryne Hitch in regards to see if if was okay to start Palliative services.  After talking with her she said that the patient seems to be doing much better now with the home health PT/OT services and she wanted to go ahead and cancel this referral for Palliative at this time. Wife questioned as to what services we offered, I explained to her what Palliative services were and what we could provide in a general overview.  She stated that if she felt this was needed later on that she would contact the [place] MD and request another referral.

## 2018-10-29 DIAGNOSIS — H353131 Nonexudative age-related macular degeneration, bilateral, early dry stage: Secondary | ICD-10-CM | POA: Diagnosis not present

## 2018-10-29 DIAGNOSIS — G4733 Obstructive sleep apnea (adult) (pediatric): Secondary | ICD-10-CM | POA: Diagnosis not present

## 2018-10-29 DIAGNOSIS — H4720 Unspecified optic atrophy: Secondary | ICD-10-CM | POA: Diagnosis not present

## 2018-10-29 DIAGNOSIS — E113553 Type 2 diabetes mellitus with stable proliferative diabetic retinopathy, bilateral: Secondary | ICD-10-CM | POA: Diagnosis not present

## 2018-11-13 ENCOUNTER — Other Ambulatory Visit: Payer: Self-pay

## 2018-11-13 ENCOUNTER — Ambulatory Visit (INDEPENDENT_AMBULATORY_CARE_PROVIDER_SITE_OTHER): Payer: Medicare Other | Admitting: Cardiology

## 2018-11-13 ENCOUNTER — Encounter: Payer: Self-pay | Admitting: Cardiology

## 2018-11-13 VITALS — BP 115/75 | HR 90 | Ht 70.0 in | Wt 302.0 lb

## 2018-11-13 DIAGNOSIS — K7460 Unspecified cirrhosis of liver: Secondary | ICD-10-CM

## 2018-11-13 DIAGNOSIS — R188 Other ascites: Secondary | ICD-10-CM

## 2018-11-13 DIAGNOSIS — I5042 Chronic combined systolic (congestive) and diastolic (congestive) heart failure: Secondary | ICD-10-CM | POA: Diagnosis not present

## 2018-11-13 DIAGNOSIS — Z45018 Encounter for adjustment and management of other part of cardiac pacemaker: Secondary | ICD-10-CM

## 2018-11-13 DIAGNOSIS — I350 Nonrheumatic aortic (valve) stenosis: Secondary | ICD-10-CM | POA: Diagnosis not present

## 2018-11-13 NOTE — Progress Notes (Signed)
Primary Physician/Referring:  [facility], [facility]  Patient ID: Keith [name], male    DOB: Jan 05, 1941, 78 y.o.   MRN: [id number]  Chief Complaint  Patient presents with   Coronary Artery Disease   Hypertension   Follow-up    pt c/o cough    HPI: Keith [name]  is a 78 y.o. male  with extensive medical history including morbid obesity, cirrhosis of liver due to [facility] and also hepatocellular carcinoma S/O partial hepatectomy in 2007,  Esophageal varices, chronic thrombocytopenia follows [facility] (Dr Gordy Levan), follicular lymphoma S/P chemotherapy 2009 and sees [facility] once every 6 months, obstructive sleep apnea, not on CPAP (non compliant), history of high degree AV block S/P Med[employer]onic dual-chamber pacemaker implantation on 06/25/2016 and pacemaker dependent, diabetes mellitus, hypertension, hyperlipidemia,history of TIA in 2007, was on plavix and ASA and I have discontinued plavix due to bleeding risk since Dec 2019 due to hepatic insufficiency and cirrhosis and also due to chronic thrombocytopenia.   He has moderate to severe AS and admitted to [facility]18 with acute decompensated CHF and found to have new non ischemic cardiomyopathy with EF 30% and felt to be due to AS.    He is presently doing well and dyspnea has improved and has had significant improvement of leg edema. No further hypotensive episodes. No chest pain.   He is a His only complaint today was occasional dry cough.  No PND orthopnea. lso increase his physical activity lately and has been able to walk in the house and also outside of his house.    Past Medical History:  Diagnosis Date   Cirrhosis of liver (Keith [name])    NASH   Diabetes mellitus    Hepatic cancer (Keith [name])    Prostate hypertrophy    Shortness of breath    Stroke (Keith [name])    Varicose veins    Weakness of both legs    Past Surgical History:  Procedure Laterality Date   APPENDECTOMY     EYE SURGERY     bilateral cataract     INTRAVASCULAR PRESSURE WIRE/FFR STUDY N/A 04/17/2018   Procedure: INTRAVASCULAR PRESSURE WIRE/FFR STUDY;  Surgeon: Adrian Prows, MD;  Location: [place];  Service: Cardiovascular;  Laterality: N/A;   left arm surgery     age 56   left hand surgery     tendons ripped on left hand   LIVER RESECTION     RIGHT/LEFT HEART CATH AND CORONARY ANGIOGRAPHY N/A 04/17/2018   Procedure: RIGHT/LEFT HEART CATH AND CORONARY ANGIOGRAPHY;  Surgeon: Adrian Prows, MD;  Location: [place] [facility];  Service: Cardiovascular;  Laterality: N/A;   TEE WITHOUT CARDIOVERSION N/A 04/16/2018   Procedure: TRANSESOPHAGEAL ECHOCARDIOGRAM (TEE);  Surgeon: Adrian Prows, MD;  Location: [facility];  Service: Cardiovascular;  Laterality: N/A;   TONSILLECTOMY     TOTAL KNEE ARTHROPLASTY Left 12/14/2012   Procedure: LEFT TOTAL KNEE ARTHROPLASTY;  Surgeon: Gearlean Alf, MD;  Location: [facility];  Service: Orthopedics;  Laterality: Left;    Social History   Socioeconomic History   Marital status: Married    Spouse name: Not on file   Number of children: 0   Years of education: Not on file   Highest education level: Not on file  Occupational History   Occupation: retired  Scientist, product/process development strain: Not on file   Food insecurity    Worry: Not on file    Inability: Not on file   Transportation needs  Medical: Not on file    Non-medical: Not on file  Tobacco Use   Smoking status: Former Smoker    Packs/day: 3.00    Years: 15.00    Pack years: 45.00    Types: Cigarettes    Quit date: 1970    Years since quitting: 50.6   Smokeless tobacco: Never Used  Substance and Sexual Activity   Alcohol use: No    Alcohol/week: 0.0 standard drinks   Drug use: No   Sexual activity: Not Currently  Lifestyle   Physical activity    Days per week: Not on file    Minutes per session: Not on file   Stress: Not on file  Relationships   Social connections    Talks on phone: Not on file     Gets together: Not on file    Attends religious service: Not on file    Active member of club or organization: Not on file    Attends meetings of clubs or organizations: Not on file    Relationship status: Not on file   Intimate partner violence    Fear of current or ex partner: Not on file    Emotionally abused: Not on file    Physically abused: Not on file    Forced sexual activity: Not on file  Other Topics Concern   Not on file  Social History Narrative   Not on file    Current Outpatient Medications on File Prior to Visit  Medication Sig Dispense Refill   albuterol (PROVENTIL HFA;VENTOLIN HFA) 108 (90 BASE) MCG/ACT inhaler Inhale 2 puffs into the lungs every 4 (four) hours as needed for wheezing or shortness of breath.      amitriptyline (ELAVIL) 25 MG tablet Take 25 mg by mouth at bedtime.      aspirin EC 81 MG EC tablet Take 1 tablet (81 mg total) by mouth daily. 30 tablet 0   Cholecalciferol (VITAMIN D3) 50 MCG (2000 UT) TABS Take 2,000 Units by mouth daily.     cyanocobalamin (,VITAMIN B-12,) 1000 MCG/ML injection Inject 1,000 mcg into the muscle every 30 (thirty) days.     finasteride (PROSCAR) 5 MG tablet Take 5 mg by mouth every evening.      folic acid (FOLVITE) 1 MG tablet Take 1 mg by mouth daily.     furosemide (LASIX) 20 MG tablet Take 20 mg by mouth daily.     hydrocerin (EUCERIN) CREA Apply 1 application topically daily as needed (dry skin).      insulin aspart (NOVOLOG) 100 UNIT/ML injection Inject 5-30 Units into the skin See admin instructions. Inject 5-30 units into the skin three times a day before meals, per sliding scale     insulin glargine (LANTUS) 100 UNIT/ML injection Inject 30 Units into the skin 2 (two) times daily. Inject 30 units into the skin 2 times a day- 10 AM and bedtime     memantine (NAMENDA) 10 MG tablet Take 20 mg by mouth at bedtime.     pantoprazole (PROTONIX) 40 MG tablet Take 40 mg by mouth daily.     polyvinyl alcohol  (LIQUIFILM TEARS) 1.4 % ophthalmic solution Place 1 drop into both eyes 2 (two) times daily as needed (dry eyes).     pregabalin (LYRICA) 300 MG capsule Take 300 mg by mouth 2 (two) times daily.     rifaximin (XIFAXAN) 550 MG TABS tablet Take 550 mg by mouth 2 (two) times daily.     simvastatin (ZOCOR) 20 MG  tablet Take 10 mg by mouth every evening.     spironolactone (ALDACTONE) 25 MG tablet Take 2 tablets (50 mg total) by mouth daily.     tamsulosin (FLOMAX) 0.4 MG CAPS Take 0.4 mg by mouth every evening.      traMADol (ULTRAM) 50 MG tablet Take 1 tablet (50 mg total) by mouth every 12 (twelve) hours as needed for moderate pain. (Patient taking differently: Take 50 mg by mouth every 6 (six) hours as needed (for pain). ) 30 tablet    No current facility-administered medications on file prior to visit.    Review of Systems  Constitution: Positive for malaise/fatigue (improving). Negative for decreased appetite, weight gain and weight loss.  Eyes: Negative for visual disturbance.  Cardiovascular: Positive for leg swelling (improved). Negative for chest pain, claudication, dyspnea on exertion, orthopnea, palpitations and syncope.  Respiratory: Positive for shortness of breath. Negative for hemoptysis and wheezing.   Endocrine: Negative for cold intolerance and heat intolerance.  Hematologic/Lymphatic: Negative for bleeding problem. Does not bruise/bleed easily.  Skin: Negative for nail changes.  Musculoskeletal: Positive for back pain, joint pain and muscle weakness (improved). Negative for myalgias.  Gastrointestinal: Negative for abdominal pain, change in bowel habit, nausea and vomiting.  Neurological: Negative for difficulty with concentration, dizziness, focal weakness and headaches.  Psychiatric/Behavioral: Negative for altered mental status and suicidal ideas.  All other systems reviewed and are negative.  Objective  Blood pressure 115/75, pulse 90, height 5' 10"  (1.778 m), weight  (!) 302 lb (137 kg), SpO2 98 %. Body mass index is 43.33 kg/m.     Physical Exam  Constitutional: He appears well-developed. No distress.  Morbidly obese  HENT:  Head: Atraumatic.  Eyes: Conjunctivae are normal.  Neck: No thyromegaly present.  Short neck and difficult to make out JVD  Cardiovascular: Normal rate, regular rhythm and intact distal pulses. Exam reveals distant heart sounds. Exam reveals no gallop.  Murmur heard.  Harsh midsystolic murmur is present with a grade of 3/6 at the upper right sternal border. Femoral and popliteal pulse difficult to feel due to patient's bodily habitus. Pulses:      Carotid pulses are 2+ on the right side and 2+ on the left side.      Radial pulses are 2+ on the right side and 2+ on the left side.       Dorsalis pedis pulses are 0 on the right side and 0 on the left side.       Posterior tibial pulses are 1+ on the right side and 1+ on the left side.  Pulmonary/Chest: Effort normal and breath sounds normal.  Abdominal: Soft. Bowel sounds are normal.  Large pannus and obese  Musculoskeletal: Normal range of motion.        General: Edema (2 plus pitting. Chronic dermitis change noted) present.  Neurological: He is alert.  Skin: Skin is warm and dry.  Psychiatric: He has a normal mood and affect.   Radiology: No results found.  Laboratory examination:    CMP Latest Ref Rng & Units 09/12/2018 09/11/2018 08/31/2018  Glucose 70 - 99 mg/dL 123(H) 127(H) 86  BUN 8 - 23 mg/dL 12 11 15   Creatinine 0.61 - 1.24 mg/dL 1.07 0.93 0.96  Sodium 135 - 145 mmol/L 144 141 142  Potassium 3.5 - 5.1 mmol/L 3.5 4.0 3.9  Chloride 98 - 111 mmol/L 105 106 106  CO2 22 - 32 mmol/L 30 25 25   Calcium 8.9 - 10.3 mg/dL 8.7(L) 8.8(L) 8.3(L)  Total Protein 6.5 - 8.1 g/dL 6.4(L) 6.3(L) 6.4(L)  Total Bilirubin 0.3 - 1.2 mg/dL 1.9(H) 1.8(H) 1.4(H)  Alkaline Phos 38 - 126 U/L 104 96 90  AST 15 - 41 U/L 30 29 36  ALT 0 - 44 U/L 17 15 16    CBC Latest Ref Rng & Units  09/12/2018 09/11/2018 08/31/2018  WBC 4.0 - 10.5 K/uL 2.7(L) 2.4(L) 3.3(L)  Hemoglobin 13.0 - 17.0 g/dL 10.7(L) 10.6(L) 10.6(L)  Hematocrit 39.0 - 52.0 % 34.0(L) 33.7(L) 34.3(L)  Platelets 150 - 400 K/uL 58(L) 53(L) 66(L)   Lipid Panel     Component Value Date/Time   CHOL 149 11/23/2017 1404   TRIG 79 11/23/2017 1404   HDL 37 (L) 11/23/2017 1404   CHOLHDL 4.0 11/23/2017 1404   VLDL 16 11/23/2017 1404   LDLCALC 96 11/23/2017 1404   HEMOGLOBIN A1C Lab Results  Component Value Date   HGBA1C 5.0 09/12/2018   MPG 96.8 09/12/2018   TSH Recent Labs    11/23/17 1404 09/12/18 0446  TSH 1.943 3.572    Cardiac Studies:   Right and left heart catheterization 04/17/2018: RA 13/12, mean 11 mmHg.; RV 50/12, EDP 5 mmHg.; PA 49/13, mean 35 mmHg, PA saturation 61%.; PW 28/39, mean 28 mmHg. Aortic saturation was 95%. CO 6.34, CI 2.53. QP/QS 1.00. Aortic peak to peak gradient 30 mmHg, mean 25.1 mmHg, valve area 1.10 cm.  Left main: Mildly calcified. No significant disease. LAD: Mild to moderately calcified. Ostial 50 to 60% stenosis. DFR 0.95, FFR 0.89, not hemodynamically significant. Mild diffuse disease throughout the LAD. Large D1. RI: Mild diffuse disease. CX: Mild calcification, no significant disease. RCA: Very large and ectatic, moderately calcified diffusely. Slow flow is evident. No high-grade stenosis is evident. Large PL and PDA branches.  Impression: No significant coronary artery disease, moderate disease in the ostial LAD. Moderate aortic stenosis. Moderate pulmonary hypertension with elevated moderate LVEDP.  Recommendation: Patient will be diuresed today, he will potentially be discharged home in the morning, TAVR team is evaluating the patient for possible TAVR.  TEE 04/16/2018: LVEF 35 to 40% % with global hypokinesis. Trileaflet aortic valve with severe calcific degeneration. Severe aortic stenosis, calculated aortic valve area 0.99 cm. Planimetry  valve area 0.7 cm. Mild MR and TR. Mild to moderate aortic atherosclerosis. Peak PG of 37 and mean PG is 21 mm Hg.  EKG 04/15/2018: Sinus rhythm with first-degree AV block, ventricularly paced rhythm. No further analysis.  Assessment     ICD-10-CM   1. Chronic combined systolic and diastolic CHF, NYHA class 1 (HCC)  I50.42   2. Severe aortic stenosis  I35.0   3. Cirrhosis of liver with ascites, unspecified hepatic cirrhosis type ([place])  K74.60    R18.8    EKG 04/15/2018: Sinus rhythm with first-degree AV block, ventricularly paced rhythm. No further analysis.  Recommendations:   Patient presents here for 10-monthoffice visit and follow-up of chronic systolic and diastolic heart failure, severe aortic stenosis.  [facility].  He has changed over remarkably well, he has lost about 37 pounds in weight, leg edema has improved, he is now ambulating in the house with the help of a walker and has been very careful with his diet.  There is no clinical evidence of acute decompensated heart failure.    No change in his physical exam with regard to aortic stenosis.  I do not think  he would survive even TAVR in view of potential bleeding complications, chronic thrombocytopenia, cirrhosis of the liver.  With regard to his permanent pacemaker, he is pacemaker dependent and ventricularly paced greater than 96%.  He will continue remote monitoring.  His blood pressure is stable, his not on any beta-blocker or ACE inhibitor due to severe profound hypotension and orthostasis.  He will continue with Lasix.  He is DNR.  I would like to see him back in 3 months.  Adrian Prows, MD, [facility] 11/13/2018, 11:53 AM [facility] [place] Pager: [phone] Office: [phone] If no answer Cell [phone]

## 2018-11-15 ENCOUNTER — Encounter: Payer: Self-pay | Admitting: Cardiology

## 2018-11-15 DIAGNOSIS — Z45018 Encounter for adjustment and management of other part of cardiac pacemaker: Secondary | ICD-10-CM | POA: Insufficient documentation

## 2018-11-15 HISTORY — DX: Encounter for adjustment and management of other part of cardiac pacemaker: Z45.018

## 2018-12-14 DIAGNOSIS — I442 Atrioventricular block, complete: Secondary | ICD-10-CM | POA: Diagnosis not present

## 2018-12-14 DIAGNOSIS — Z45018 Encounter for adjustment and management of other part of cardiac pacemaker: Secondary | ICD-10-CM | POA: Diagnosis not present

## 2018-12-14 DIAGNOSIS — Z950 Presence of cardiac pacemaker: Secondary | ICD-10-CM | POA: Diagnosis not present

## 2018-12-16 DIAGNOSIS — K7460 Unspecified cirrhosis of liver: Secondary | ICD-10-CM | POA: Diagnosis not present

## 2018-12-16 DIAGNOSIS — C220 Liver cell carcinoma: Secondary | ICD-10-CM | POA: Diagnosis not present

## 2018-12-17 DIAGNOSIS — H524 Presbyopia: Secondary | ICD-10-CM | POA: Diagnosis not present

## 2019-02-18 ENCOUNTER — Other Ambulatory Visit: Payer: Self-pay

## 2019-02-18 ENCOUNTER — Encounter: Payer: Self-pay | Admitting: Cardiology

## 2019-02-18 ENCOUNTER — Telehealth: Payer: Medicare Other | Admitting: Cardiology

## 2019-02-18 VITALS — BP 122/70 | Ht 70.0 in | Wt 290.0 lb

## 2019-02-18 DIAGNOSIS — I5042 Chronic combined systolic (congestive) and diastolic (congestive) heart failure: Secondary | ICD-10-CM | POA: Diagnosis not present

## 2019-02-18 DIAGNOSIS — E118 Type 2 diabetes mellitus with unspecified complications: Secondary | ICD-10-CM

## 2019-02-18 DIAGNOSIS — R188 Other ascites: Secondary | ICD-10-CM

## 2019-02-18 DIAGNOSIS — K7460 Unspecified cirrhosis of liver: Secondary | ICD-10-CM

## 2019-02-18 DIAGNOSIS — Z794 Long term (current) use of insulin: Secondary | ICD-10-CM

## 2019-02-18 DIAGNOSIS — E7800 Pure hypercholesterolemia, unspecified: Secondary | ICD-10-CM

## 2019-02-18 DIAGNOSIS — I350 Nonrheumatic aortic (valve) stenosis: Secondary | ICD-10-CM

## 2019-02-18 DIAGNOSIS — Z950 Presence of cardiac pacemaker: Secondary | ICD-10-CM | POA: Diagnosis not present

## 2019-02-18 NOTE — Progress Notes (Signed)
Virtual Visit via Telephone Note: Patient unable to use video assisted device.  This visit type was conducted due to national recommendations for restrictions regarding the COVID-19 Pandemic (e.g. social distancing).  This format is felt to be most appropriate for this patient at this time.  All issues noted in this document were discussed and addressed.  No physical exam was performed.  The patient has consented to conduct a Telehealth visit and understands insurance will be billed.   I connected with@, on 02/18/19 at  by TELEPHONE and verified that I am speaking with the correct person using two identifiers.   I discussed the limitations of evaluation and management by telemedicine and the availability of in person appointments. The patient expressed understanding and agreed to proceed.   I have discussed with patient regarding the safety during COVID Pandemic and steps and precautions to be taken including social distancing, frequent hand wash and use of detergent soap, gels with the patient. I asked the patient to avoid touching mouth, nose, eyes, ears with the hands. I encouraged regular walking around the neighborhood and exercise and regular diet, as long as social distancing can be maintained.   Primary Physician/Referring:  Reubin Milan, MD  Patient ID: Keith [name], male    DOB: 09/09/1940, 78 y.o.   MRN: [id number]  Chief Complaint  Patient presents with  . Congestive Heart Failure  . Follow-up   HPI: Keith [name]  is a 78 y.o. male  with extensive medical history including morbid obesity, cirrhosis of liver due to [facility] and also hepatocellular carcinoma S/O partial hepatectomy in 2007,  Esophageal varices, chronic thrombocytopenia follows [facility] (Dr Gordy Levan), follicular lymphoma S/P chemotherapy 2009 and sees [facility] once every 6 months, obstructive sleep apnea, not on CPAP (non compliant), history of high degree AV block S/P Medtronic dual-chamber  pacemaker implantation on 06/25/2016 and pacemaker dependent, diabetes mellitus, hypertension, hyperlipidemia,history of TIA in 2007, since Dec 2019 due to hepatic insufficiency and cirrhosis and also due to chronic thrombocytopenia he is only on ASA 81 mg daily. No bleeding diathesis   He has moderate to severe AS and admitted to [facility] on 04/14/18 with acute decompensated CHF and found to have new non ischemic cardiomyopathy with EF 30% and felt to be due to AS.    He is presently doing well and dyspnea has improved and has had significant improvement of leg edema. No further hypotensive episodes. No chest pain. He even went out with his friends for [employer] day.   No PND orthopnea. lso increase his physical activity lately and has been able to walk in the house and also outside of his house.  He was sitting in the [place] during my interview.   Past Medical History:  Diagnosis Date  . Cirrhosis of liver (HCC)    NASH  . Diabetes mellitus   . Encounter for care of pacemaker 11/15/2018  . Hepatic cancer ([place])   . Pacemaker: Medtronic Advisa [id number] dual chamber pacemaker for complete heart block 06/25/2016  . Prostate hypertrophy   . Shortness of breath   . Stroke ([place])   . Varicose veins   . Weakness of both legs    Past Surgical History:  Procedure Laterality Date  . APPENDECTOMY    . EYE SURGERY     bilateral cataract   . INTRAVASCULAR PRESSURE WIRE/FFR STUDY N/A 04/17/2018   Procedure: INTRAVASCULAR PRESSURE WIRE/FFR STUDY;  Surgeon: Adrian Prows, MD;  Location: [place] [facility];  Service: Cardiovascular;  Laterality: N/A;  . left arm surgery     age 89  . left hand surgery     tendons ripped on left hand  . LIVER RESECTION    . RIGHT/LEFT HEART CATH AND CORONARY ANGIOGRAPHY N/A 04/17/2018   Procedure: RIGHT/LEFT HEART CATH AND CORONARY ANGIOGRAPHY;  Surgeon: Adrian Prows, MD;  Location: [place] [facility];  Service: Cardiovascular;  Laterality: N/A;  . TEE WITHOUT CARDIOVERSION N/A  04/16/2018   Procedure: TRANSESOPHAGEAL ECHOCARDIOGRAM (TEE);  Surgeon: Adrian Prows, MD;  Location: [place];  Service: Cardiovascular;  Laterality: N/A;  . TONSILLECTOMY    . TOTAL KNEE ARTHROPLASTY Left 12/14/2012   Procedure: LEFT TOTAL KNEE ARTHROPLASTY;  Surgeon: Gearlean Alf, MD;  Location: [facility];  Service: Orthopedics;  Laterality: Left;    Social History   Socioeconomic History  . Marital status: Married    Spouse name: Not on file  . Number of children: 0  . Years of education: Not on file  . Highest education level: Not on file  Occupational History  . Occupation: retired  Scientific laboratory technician  . Financial resource strain: Not on file  . Food insecurity    Worry: Not on file    Inability: Not on file  . Transportation needs    Medical: Not on file    Non-medical: Not on file  Tobacco Use  . Smoking status: Former Smoker    Packs/day: 3.00    Years: 15.00    Pack years: 45.00    Types: Cigarettes    Quit date: 1970    Years since quitting: 50.8  . Smokeless tobacco: Never Used  Substance and Sexual Activity  . Alcohol use: No    Alcohol/week: 0.0 standard drinks  . Drug use: No  . Sexual activity: Not Currently  Lifestyle  . Physical activity    Days per week: Not on file    Minutes per session: Not on file  . Stress: Not on file  Relationships  . Social Herbalist on phone: Not on file    Gets together: Not on file    Attends religious service: Not on file    Active member of club or organization: Not on file    Attends meetings of clubs or organizations: Not on file    Relationship status: Not on file  . Intimate partner violence    Fear of current or ex partner: Not on file    Emotionally abused: Not on file    Physically abused: Not on file    Forced sexual activity: Not on file  Other Topics Concern  . Not on file  Social History Narrative  . Not on file    Current Outpatient Medications on File Prior to Visit  Medication Sig Dispense  Refill  . albuterol (PROVENTIL HFA;VENTOLIN HFA) 108 (90 BASE) MCG/ACT inhaler Inhale 2 puffs into the lungs every 4 (four) hours as needed for wheezing or shortness of breath.     Marland Kitchen amitriptyline (ELAVIL) 25 MG tablet Take 25 mg by mouth at bedtime.     Marland Kitchen aspirin EC 81 MG EC tablet Take 1 tablet (81 mg total) by mouth daily. 30 tablet 0  . Cholecalciferol (VITAMIN D3) 50 MCG (2000 UT) TABS Take 2,000 Units by mouth daily.    . cyanocobalamin (,VITAMIN B-12,) 1000 MCG/ML injection Inject 1,000 mcg into the muscle every 30 (thirty) days.    . finasteride (PROSCAR) 5 MG tablet Take 5 mg by mouth every evening.     Marland Kitchen  folic acid (FOLVITE) 1 MG tablet Take 1 mg by mouth daily.    . furosemide (LASIX) 20 MG tablet Take 20 mg by mouth daily.    . hydrocerin (EUCERIN) CREA Apply 1 application topically daily as needed (dry skin).     . insulin aspart (NOVOLOG) 100 UNIT/ML injection Inject 5-30 Units into the skin See admin instructions. Inject 5-30 units into the skin three times a day before meals, per sliding scale    . insulin glargine (LANTUS) 100 UNIT/ML injection Inject 40 Units into the skin 2 (two) times daily. Inject 40 units into the skin 2 times a day- 10 AM and bedtime    . memantine (NAMENDA) 10 MG tablet Take 20 mg by mouth at bedtime.    . pantoprazole (PROTONIX) 40 MG tablet Take 40 mg by mouth daily.    . polyvinyl alcohol (LIQUIFILM TEARS) 1.4 % ophthalmic solution Place 1 drop into both eyes 2 (two) times daily as needed (dry eyes).    . pregabalin (LYRICA) 300 MG capsule Take 300 mg by mouth 2 (two) times daily.    . rifaximin (XIFAXAN) 550 MG TABS tablet Take 550 mg by mouth 2 (two) times daily.    . rivastigmine (EXELON) 1.5 MG capsule Take 1.5 mg by mouth. 3 tablets in AM and PM    . simvastatin (ZOCOR) 20 MG tablet Take 10 mg by mouth every evening.    Marland Kitchen spironolactone (ALDACTONE) 25 MG tablet Take 2 tablets (50 mg total) by mouth daily.    . tamsulosin (FLOMAX) 0.4 MG CAPS Take  0.4 mg by mouth every evening.     . traMADol (ULTRAM) 50 MG tablet Take 1 tablet (50 mg total) by mouth every 12 (twelve) hours as needed for moderate pain. (Patient taking differently: Take 50 mg by mouth every 6 (six) hours as needed (for pain). ) 30 tablet    No current facility-administered medications on file prior to visit.    Review of Systems  Constitution: Positive for malaise/fatigue (improving). Negative for decreased appetite, weight gain and weight loss.  Eyes: Negative for visual disturbance.  Cardiovascular: Positive for leg swelling (improved). Negative for chest pain, claudication, dyspnea on exertion, orthopnea, palpitations and syncope.  Respiratory: Positive for shortness of breath. Negative for hemoptysis and wheezing.   Endocrine: Negative for cold intolerance and heat intolerance.  Hematologic/Lymphatic: Negative for bleeding problem. Does not bruise/bleed easily.  Skin: Negative for nail changes.  Musculoskeletal: Positive for back pain, joint pain and muscle weakness (improved). Negative for myalgias.  Gastrointestinal: Negative for abdominal pain, change in bowel habit, nausea and vomiting.  Neurological: Negative for difficulty with concentration, dizziness, focal weakness and headaches.  Psychiatric/Behavioral: Negative for altered mental status and suicidal ideas.  All other systems reviewed and are negative.  Objective   Vitals with BMI 02/18/2019 11/13/2018 09/12/2018  Height 5' 10"  5' 10"  -  Weight 290 lbs 302 lbs -  BMI 44.01 02.72 -  Systolic 536 644 034  Diastolic 70 75 71  Pulse - 90 88    Physical exam not performed or limited due to virtual visit.  Patient appeared to be in no distress, Neck was supple, respiration was not labored.  Please see exam details from prior visit is as below.  Physical Exam  Constitutional: He appears well-developed. No distress.  Morbidly obese  HENT:  Head: Atraumatic.  Eyes: Conjunctivae are normal.  Neck: No  thyromegaly present.  Short neck and difficult to make out JVD  Cardiovascular:  Normal rate, regular rhythm and intact distal pulses. Exam reveals distant heart sounds. Exam reveals no gallop.  Murmur heard.  Harsh midsystolic murmur is present with a grade of 3/6 at the upper right sternal border. Femoral and popliteal pulse difficult to feel due to patient's bodily habitus. Pulses:      Carotid pulses are 2+ on the right side and 2+ on the left side.      Radial pulses are 2+ on the right side and 2+ on the left side.       Dorsalis pedis pulses are 0 on the right side and 0 on the left side.       Posterior tibial pulses are 1+ on the right side and 1+ on the left side.  Pulmonary/Chest: Effort normal and breath sounds normal.  Abdominal: Soft. Bowel sounds are normal.  Large pannus and obese  Musculoskeletal: Normal range of motion.        General: Edema (2 plus pitting. Chronic dermitis change noted) present.  Neurological: He is alert.  Skin: Skin is warm and dry.  Psychiatric: He has a normal mood and affect.   Radiology: No results found.  Laboratory examination:    CMP Latest Ref Rng & Units 09/12/2018 09/11/2018 08/31/2018  Glucose 70 - 99 mg/dL 123(H) 127(H) 86  BUN 8 - 23 mg/dL 12 11 15   Creatinine 0.61 - 1.24 mg/dL 1.07 0.93 0.96  Sodium 135 - 145 mmol/L 144 141 142  Potassium 3.5 - 5.1 mmol/L 3.5 4.0 3.9  Chloride 98 - 111 mmol/L 105 106 106  CO2 22 - 32 mmol/L 30 25 25   Calcium 8.9 - 10.3 mg/dL 8.7(L) 8.8(L) 8.3(L)  Total Protein 6.5 - 8.1 g/dL 6.4(L) 6.3(L) 6.4(L)  Total Bilirubin 0.3 - 1.2 mg/dL 1.9(H) 1.8(H) 1.4(H)  Alkaline Phos 38 - 126 U/L 104 96 90  AST 15 - 41 U/L 30 29 36  ALT 0 - 44 U/L 17 15 16    CBC Latest Ref Rng & Units 09/12/2018 09/11/2018 08/31/2018  WBC 4.0 - 10.5 K/uL 2.7(L) 2.4(L) 3.3(L)  Hemoglobin 13.0 - 17.0 g/dL 10.7(L) 10.6(L) 10.6(L)  Hematocrit 39.0 - 52.0 % 34.0(L) 33.7(L) 34.3(L)  Platelets 150 - 400 K/uL 58(L) 53(L) 66(L)   Lipid Panel      Component Value Date/Time   CHOL 149 11/23/2017 1404   TRIG 79 11/23/2017 1404   HDL 37 (L) 11/23/2017 1404   CHOLHDL 4.0 11/23/2017 1404   VLDL 16 11/23/2017 1404   LDLCALC 96 11/23/2017 1404   HEMOGLOBIN A1C Lab Results  Component Value Date   HGBA1C 5.0 09/12/2018   MPG 96.8 09/12/2018   TSH Recent Labs    09/12/18 0446  TSH 3.572    Cardiac Studies:   Right and left heart catheterization 04/17/2018: RA 13/12, mean 11 mmHg.; RV 50/12, EDP 5 mmHg.; PA 49/13, mean 35 mmHg, PA saturation 61%.; PW 28/39, mean 28 mmHg. Aortic saturation was 95%. CO 6.34, CI 2.53. QP/QS 1.00. Aortic peak to peak gradient 30 mmHg, mean 25.1 mmHg, valve area 1.10 cm.  Left main: Mildly calcified. No significant disease. LAD: Mild to moderately calcified. Ostial 50 to 60% stenosis. DFR 0.95, FFR 0.89, not hemodynamically significant. Mild diffuse disease throughout the LAD. Large D1. RI: Mild diffuse disease. CX: Mild calcification, no significant disease. RCA: Very large and ectatic, moderately calcified diffusely. Slow flow is evident. No high-grade stenosis is evident. Large PL and PDA branches.  Impression: No significant coronary artery disease, moderate disease in the  ostial LAD. Moderate aortic stenosis. Moderate pulmonary hypertension with elevated moderate LVEDP.  Recommendation: Patient will be diuresed today, he will potentially be discharged home in the morning, TAVR team is evaluating the patient for possible TAVR.  TEE 04/16/2018: LVEF 35 to 40% % with global hypokinesis. Trileaflet aortic valve with severe calcific degeneration. Severe aortic stenosis, calculated aortic valve area 0.99 cm. Planimetry valve area 0.7 cm. Mild MR and TR. Mild to moderate aortic atherosclerosis. Peak PG of 37 and mean PG is 21 mm Hg.  EKG 04/15/2018: Sinus rhythm with first-degree AV block, ventricularly paced rhythm. No further analysis.  Assessment     ICD-10-CM    1. Chronic combined systolic and diastolic CHF, NYHA class 1 (HCC)  I50.42 CBC    CMP14+EGFR    TSH  2. Severe aortic stenosis  I35.0   3. Cirrhosis of liver with ascites, unspecified hepatic cirrhosis type ([place])  K74.60    R18.8   4. Pacemaker: Medtronic Advisa [id number] dual chamber pacemaker for complete heart block  Z95.0   5. Hypercholesteremia  E78.00 Lipid Panel With LDL/HDL Ratio  6. Controlled type 2 diabetes mellitus with complication, with long-term current use of insulin (HCC)  E11.8 Hgb A1c w/o eAG   Z79.4    EKG 04/15/2018: Sinus rhythm with first-degree AV block, ventricularly paced rhythm. No further analysis.  Scheduled Remote pacemaker check  12/14/2018: No AF.FL. There was 1 high ventricular rate episode detected. EGM shows 8 bts of NSVT. Health trends do not demonstrate significant abnormality. Battery longevity is 5.5-7.5 years. RA pacing is 1.3 %, RV pacing is 98.1 %.  Recommendations:   Keith [name]  is a 78 y.o. male  with extensive medical history including morbid obesity, cirrhosis of liver due to [facility] and also hepatocellular carcinoma S/O partial hepatectomy in 2007,  Esophageal varices with multiple extensive sclerotherapy at [facility], chronic thrombocytopenia follows [facility] (Dr Gordy Levan), follicular lymphoma S/P chemotherapy 2009 and sees [facility] once every 6 months, obstructive sleep apnea, not on CPAP (non compliant), history of high degree AV block S/P Medtronic dual-chamber pacemaker implantation on 06/25/2016 and pacemaker dependent, diabetes mellitus, hypertension, hyperlipidemia,history of TIA in 2007.  Patient presents here for 43-monthoffice visit, this is a virtual visit.  He is now DNR, due to his underlying comorbidity, TAVR felt not to be an option. I do not think he would survive even TAVR in view of potential bleeding complications, chronic thrombocytopenia, cirrhosis of the liver.  Fortunately he has done well, his activity level has  improved, overall he is doing stable from cardiac standpoint. He has continued to lose weight which have encouraged.  Leg edema has remained stable.  No recent labs, would like to see him back in 3 months and will repeat labs prior visits..  With regard to his permanent pacemaker, he is pacemaker dependent and ventricularly paced greater than 98%.  He will continue remote monitoring.  His blood pressure is stable, his not on any beta-blocker or ACE inhibitor due to severe profound hypotension and orthostasis. He will continue with Lasix.  He is DNR.  I would like to see him back in 3 months.  JAdrian Prows MD, FHampshire Memorial Hospital11/03/2019, 11:38 AM PMelrose ParkCardiovascular. PMunjorPager: [phone] Office: [phone] no answer Cell [phone]

## 2019-03-12 DIAGNOSIS — I350 Nonrheumatic aortic (valve) stenosis: Secondary | ICD-10-CM | POA: Diagnosis not present

## 2019-03-12 DIAGNOSIS — I5042 Chronic combined systolic (congestive) and diastolic (congestive) heart failure: Secondary | ICD-10-CM | POA: Diagnosis not present

## 2019-03-12 DIAGNOSIS — I8510 Secondary esophageal varices without bleeding: Secondary | ICD-10-CM | POA: Diagnosis not present

## 2019-03-12 DIAGNOSIS — K7460 Unspecified cirrhosis of liver: Secondary | ICD-10-CM | POA: Diagnosis not present

## 2019-03-15 DIAGNOSIS — Z45018 Encounter for adjustment and management of other part of cardiac pacemaker: Secondary | ICD-10-CM

## 2019-03-15 DIAGNOSIS — Z950 Presence of cardiac pacemaker: Secondary | ICD-10-CM

## 2019-03-15 DIAGNOSIS — I442 Atrioventricular block, complete: Secondary | ICD-10-CM

## 2019-03-30 ENCOUNTER — Telehealth: Payer: Self-pay

## 2019-03-30 NOTE — Telephone Encounter (Signed)
That is fine 

## 2019-03-30 NOTE — Telephone Encounter (Signed)
Pt wife says that its extremely hard to get pt in and out of here and out the house so she denied the pacer check for him but he has a appointment 05/21/2019 so ill have a rep come out that day for him

## 2019-05-11 DIAGNOSIS — E7800 Pure hypercholesterolemia, unspecified: Secondary | ICD-10-CM | POA: Diagnosis not present

## 2019-05-11 DIAGNOSIS — E118 Type 2 diabetes mellitus with unspecified complications: Secondary | ICD-10-CM | POA: Diagnosis not present

## 2019-05-11 DIAGNOSIS — I5042 Chronic combined systolic (congestive) and diastolic (congestive) heart failure: Secondary | ICD-10-CM | POA: Diagnosis not present

## 2019-05-11 DIAGNOSIS — Z794 Long term (current) use of insulin: Secondary | ICD-10-CM | POA: Diagnosis not present

## 2019-05-13 LAB — CMP14+EGFR
ALT: 14 IU/L (ref 0–44)
AST: 29 IU/L (ref 0–40)
Albumin/Globulin Ratio: 1.1 — ABNORMAL LOW (ref 1.2–2.2)
Albumin: 3.3 g/dL — ABNORMAL LOW (ref 3.7–4.7)
Alkaline Phosphatase: 114 IU/L (ref 39–117)
BUN/Creatinine Ratio: 13 (ref 10–24)
BUN: 13 mg/dL (ref 8–27)
Bilirubin Total: 1.5 mg/dL — ABNORMAL HIGH (ref 0.0–1.2)
CO2: 25 mmol/L (ref 20–29)
Calcium: 8.7 mg/dL (ref 8.6–10.2)
Chloride: 105 mmol/L (ref 96–106)
Creatinine, Ser: 1.01 mg/dL (ref 0.76–1.27)
GFR calc Af Amer: 83 mL/min/{1.73_m2} (ref >59)
GFR calc non Af Amer: 72 mL/min/{1.73_m2} (ref >59)
Globulin, Total: 2.9 g/dL (ref 1.5–4.5)
Glucose: 208 mg/dL — ABNORMAL HIGH (ref 65–99)
Potassium: 4.6 mmol/L (ref 3.5–5.2)
Sodium: 143 mmol/L (ref 134–144)
Total Protein: 6.2 g/dL (ref 6.0–8.5)

## 2019-05-13 LAB — TSH: TSH: 5.12 u[IU]/mL — ABNORMAL HIGH (ref 0.450–4.500)

## 2019-05-13 LAB — CBC
Hematocrit: 34.3 % — ABNORMAL LOW (ref 37.5–51.0)
Hemoglobin: 11.8 g/dL — ABNORMAL LOW (ref 13.0–17.7)
MCH: 32.3 pg (ref 26.6–33.0)
MCHC: 34.4 g/dL (ref 31.5–35.7)
MCV: 94 fL (ref 79–97)
Platelets: 49 10*3/uL — CL (ref 150–450)
RBC: 3.65 x10E6/uL — ABNORMAL LOW (ref 4.14–5.80)
RDW: 13.3 % (ref 11.6–15.4)
WBC: 3 10*3/uL — ABNORMAL LOW (ref 3.4–10.8)

## 2019-05-13 LAB — LIPID PANEL WITH LDL/HDL RATIO
Cholesterol, Total: 117 mg/dL (ref 100–199)
HDL: 60 mg/dL (ref >39)
LDL Chol Calc (NIH): 43 mg/dL (ref 0–99)
LDL/HDL Ratio: 0.7 ratio (ref 0.0–3.6)
Triglycerides: 63 mg/dL (ref 0–149)
VLDL Cholesterol Cal: 14 mg/dL (ref 5–40)

## 2019-05-13 LAB — HGB A1C W/O EAG: Hgb A1c MFr Bld: 6.4 % — ABNORMAL HIGH (ref 4.8–5.6)

## 2019-05-13 NOTE — Progress Notes (Signed)
Hemoglobin is improved to 11.8 from 10.7, indicis are normal.  Platelet count continues to remain low at 49 but stable.  Blood sugar is elevated at 208, BUN 13, creatinine 1.01, renal function is normal.  Liver enzymes normal.  Cholesterol is normal.  Thyroid function reveals mild decreased function with elevated TSH.  A1c scratch that diabetes is well controlled with A1c 6.4%.  Will discuss on office visit.

## 2019-05-21 ENCOUNTER — Encounter: Payer: Self-pay | Admitting: Cardiology

## 2019-05-21 ENCOUNTER — Other Ambulatory Visit: Payer: Self-pay

## 2019-05-21 ENCOUNTER — Ambulatory Visit: Payer: Medicare Other | Admitting: Cardiology

## 2019-05-21 VITALS — BP 127/74 | HR 92 | Temp 96.4°F | Resp 16 | Ht 70.0 in | Wt 290.0 lb

## 2019-05-21 DIAGNOSIS — I5042 Chronic combined systolic (congestive) and diastolic (congestive) heart failure: Secondary | ICD-10-CM

## 2019-05-21 DIAGNOSIS — Z45018 Encounter for adjustment and management of other part of cardiac pacemaker: Secondary | ICD-10-CM

## 2019-05-21 DIAGNOSIS — R188 Other ascites: Secondary | ICD-10-CM

## 2019-05-21 DIAGNOSIS — I442 Atrioventricular block, complete: Secondary | ICD-10-CM | POA: Diagnosis not present

## 2019-05-21 DIAGNOSIS — I350 Nonrheumatic aortic (valve) stenosis: Secondary | ICD-10-CM

## 2019-05-21 DIAGNOSIS — Z950 Presence of cardiac pacemaker: Secondary | ICD-10-CM

## 2019-05-21 DIAGNOSIS — K7460 Unspecified cirrhosis of liver: Secondary | ICD-10-CM

## 2019-05-21 NOTE — Progress Notes (Addendum)
Primary Physician/Referring:  Reubin Milan, MD  Patient ID: Keith [name], male    DOB: April 24, 1940, 79 y.o.   MRN: [id number]  Chief Complaint  Patient presents with  . Congestive Heart Failure  . Aortic Stenosis   HPI:    Keith [name]  is a 79 y.o. Caucasian male patient male  with extensive medical history including morbid obesity, diabetes mellitus, hypertension, hyperlipidemia,history of TIA in 2007, cirrhosis of liver due to [facility] and also hepatocellular carcinoma S/O partial hepatectomy in 2007,  Esophageal varices, chronic thrombocytopenia follows [facility] (Dr Gordy Levan), follicular lymphoma S/P chemotherapy 2009 and sees [facility] once every 6 months, obstructive sleep apnea on CPAP, history of high degree AV block S/P Medtronic dual-chamber pacemaker implantation on 06/25/2016 and pacemaker dependent, moderate to severe aortic stenosis with severe LV systolic dysfunction presents for f/u of CHF and A. Fib. NOt on anticoagulation due to bleeding risk and only on ASA 81 mg.   He is presently doing well and dyspnea has improved and has had significant improvement of leg edema. Accompanied by his wife. Has occasional wheezing. Leg edema is stable and improved. Has occasional wheezing.   Past Medical History:  Diagnosis Date  . Cirrhosis of liver (HCC)    NASH  . Diabetes mellitus   . Encounter for care of pacemaker 11/15/2018  . Heart block AV complete ([place]) 04/15/2018  . Hepatic cancer ([place])   . Pacemaker: Medtronic Advisa [id number] dual chamber pacemaker for complete heart block 06/25/2016  . Prostate hypertrophy   . Shortness of breath   . Stroke ([place])   . Varicose veins   . Weakness of both legs    Past Surgical History:  Procedure Laterality Date  . APPENDECTOMY    . EYE SURGERY     bilateral cataract   . INTRAVASCULAR PRESSURE WIRE/FFR STUDY N/A 04/17/2018   Procedure: INTRAVASCULAR PRESSURE WIRE/FFR STUDY;  Surgeon: Adrian Prows, MD;  Location: [place]  [facility];  Service: Cardiovascular;  Laterality: N/A;  . left arm surgery     age 27  . left hand surgery     tendons ripped on left hand  . LIVER RESECTION    . RIGHT/LEFT HEART CATH AND CORONARY ANGIOGRAPHY N/A 04/17/2018   Procedure: RIGHT/LEFT HEART CATH AND CORONARY ANGIOGRAPHY;  Surgeon: Adrian Prows, MD;  Location: [place] [facility];  Service: Cardiovascular;  Laterality: N/A;  . TEE WITHOUT CARDIOVERSION N/A 04/16/2018   Procedure: TRANSESOPHAGEAL ECHOCARDIOGRAM (TEE);  Surgeon: Adrian Prows, MD;  Location: [place];  Service: Cardiovascular;  Laterality: N/A;  . TONSILLECTOMY    . TOTAL KNEE ARTHROPLASTY Left 12/14/2012   Procedure: LEFT TOTAL KNEE ARTHROPLASTY;  Surgeon: Gearlean Alf, MD;  Location: [facility];  Service: Orthopedics;  Laterality: Left;   Social History   Tobacco Use  . Smoking status: Former Smoker    Packs/day: 3.00    Years: 15.00    Pack years: 45.00    Types: Cigarettes    Quit date: 1970    Years since quitting: 51.1  . Smokeless tobacco: Never Used  Substance Use Topics  . Alcohol use: No    Alcohol/week: 0.0 standard drinks   ROS  Review of Systems  Constitution: Negative for decreased appetite, malaise/fatigue and weight gain.  Eyes: Negative for visual disturbance.  Cardiovascular: Positive for leg swelling (improved).  Respiratory: Positive for shortness of breath, snoring (on CPAP and compliant) and wheezing. Negative for hemoptysis.   Endocrine: Negative for cold intolerance and  heat intolerance.  Hematologic/Lymphatic: Negative for bleeding problem. Does not bruise/bleed easily.  Skin: Negative for nail changes.  Musculoskeletal: Positive for back pain, joint pain and muscle weakness (improved). Negative for myalgias.  Gastrointestinal: Negative for abdominal pain, hematemesis and vomiting.  Neurological: Negative for dizziness.  All other systems reviewed and are negative.  Objective  Blood pressure 127/74, pulse 92, temperature (!) 96.4 F  (35.8 C), temperature source Temporal, resp. rate 16, height 5' 10"  (1.778 m), weight 290 lb (131.5 kg), SpO2 98 %.  Vitals with BMI 05/21/2019 02/18/2019 11/13/2018  Height 5' 10"  5' 10"  5' 10"   Weight 290 lbs 290 lbs 302 lbs  BMI 41.61 66.29 47.65  Systolic 465 035 465  Diastolic 74 70 75  Pulse 92 - 90     Physical Exam  Constitutional: He appears well-developed. No distress.  Morbidly obese  HENT:  Head: Atraumatic.  Eyes: Conjunctivae are normal.  Neck:  Short neck and difficult to make out JVD  Cardiovascular: Normal rate, regular rhythm and intact distal pulses. Exam reveals distant heart sounds. Exam reveals no gallop.  Murmur heard.  Harsh midsystolic murmur is present with a grade of 3/6 at the upper right sternal border. Femoral and popliteal pulse difficult to feel due to patient's bodily habitus. Pulses:      Carotid pulses are 2+ on the right side and 2+ on the left side.      Radial pulses are 2+ on the right side and 2+ on the left side.       Dorsalis pedis pulses are 0 on the right side and 0 on the left side.       Posterior tibial pulses are 1+ on the right side and 1+ on the left side.  No JVD. Bilateral 1-2+ edema pitting.  Chronic dermatitis changes noted.  Pulmonary/Chest: Effort normal. He has wheezes (right base).  Abdominal: Soft. Bowel sounds are normal.  Large pannus and obese  Neurological: He is alert.  Skin: Skin is warm and dry.  Psychiatric: He has a normal mood and affect.   Laboratory examination:   Recent Labs    09/11/18 1430 09/12/18 0446 05/11/19 1119  NA 141 144 143  K 4.0 3.5 4.6  CL 106 105 105  CO2 25 30 25   GLUCOSE 127* 123* 208*  BUN 11 12 13   CREATININE 0.93 1.07 1.01  CALCIUM 8.8* 8.7* 8.7  GFRNONAA >60 >60 72  GFRAA >60 >60 83   estimated creatinine clearance is 84.8 mL/min (by C-G formula based on SCr of 1.01 mg/dL).  CMP Latest Ref Rng & Units 05/11/2019 09/12/2018 09/11/2018  Glucose 65 - 99 mg/dL 208(H) 123(H) 127(H)    BUN 8 - 27 mg/dL 13 12 11   Creatinine 0.76 - 1.27 mg/dL 1.01 1.07 0.93  Sodium 134 - 144 mmol/L 143 144 141  Potassium 3.5 - 5.2 mmol/L 4.6 3.5 4.0  Chloride 96 - 106 mmol/L 105 105 106  CO2 20 - 29 mmol/L 25 30 25   Calcium 8.6 - 10.2 mg/dL 8.7 8.7(L) 8.8(L)  Total Protein 6.0 - 8.5 g/dL 6.2 6.4(L) 6.3(L)  Total Bilirubin 0.0 - 1.2 mg/dL 1.5(H) 1.9(H) 1.8(H)  Alkaline Phos 39 - 117 IU/L 114 104 96  AST 0 - 40 IU/L 29 30 29   ALT 0 - 44 IU/L 14 17 15    CBC Latest Ref Rng & Units 05/11/2019 09/12/2018 09/11/2018  WBC 3.4 - 10.8 x10E3/uL 3.0(L) 2.7(L) 2.4(L)  Hemoglobin 13.0 - 17.7 g/dL 11.8(L) 10.7(L) 10.6(L)  Hematocrit 37.5 - 51.0 % 34.3(L) 34.0(L) 33.7(L)  Platelets 150 - 450 x10E3/uL 49(LL) 58(L) 53(L)   Lipid Panel     Component Value Date/Time   CHOL 117 05/11/2019 1119   TRIG 63 05/11/2019 1119   HDL 60 05/11/2019 1119   CHOLHDL 4.0 11/23/2017 1404   VLDL 16 11/23/2017 1404   LDLCALC 43 05/11/2019 1119   HEMOGLOBIN A1C Lab Results  Component Value Date   HGBA1C 6.4 (H) 05/11/2019   MPG 96.8 09/12/2018   TSH Recent Labs    09/12/18 0446 05/11/19 1119  TSH 3.572 5.120*   Allergies  Allergen Reactions  . Contrast Media [Iodinated Diagnostic Agents] Rash    Cannot have contrast  . Fentanyl Itching and Rash  . Gadolinium Rash  . Iodine Rash  . Merbromin Rash  . Other Rash    "Opioids"     Current Outpatient Medications  Medication Instructions  . albuterol (PROVENTIL HFA;VENTOLIN HFA) 108 (90 BASE) MCG/ACT inhaler 2 puffs, Inhalation, Every 4 hours PRN  . amitriptyline (ELAVIL) 25 mg, Oral, Daily at bedtime  . aspirin 81 mg, Oral, Daily  . cyanocobalamin ((VITAMIN B-12)) 1,000 mcg, Intramuscular, Every 30 days  . finasteride (PROSCAR) 5 mg, Oral, Every evening  . folic acid (FOLVITE) 1 mg, Oral, Daily  . furosemide (LASIX) 20 mg, Oral, Daily  . hydrocerin (EUCERIN) CREA 1 application, Topical, Daily PRN  . insulin aspart (NOVOLOG) 5-30 Units, Subcutaneous,  See admin instructions, Inject 5-30 units into the skin three times a day before meals, per sliding scale  . insulin glargine (LANTUS) 40 Units, Subcutaneous, 2 times daily, Inject 40 units into the skin 2 times a day- 10 AM and bedtime  . memantine (NAMENDA) 20 mg, Oral, Daily at bedtime  . pantoprazole (PROTONIX) 40 mg, Daily  . polyvinyl alcohol (LIQUIFILM TEARS) 1.4 % ophthalmic solution 1 drop, 2 times daily PRN  . pregabalin (LYRICA) 300 mg, Oral, 2 times daily  . rifaximin (XIFAXAN) 550 mg, Oral, 2 times daily  . rivastigmine (EXELON) 6 mg, Oral, 1 tablet in the AM, one tablet in the PM  . simvastatin (ZOCOR) 10 mg, Every evening  . spironolactone (ALDACTONE) 50 mg, Oral, Daily  . tamsulosin (FLOMAX) 0.4 mg, Oral, Every evening  . traMADol (ULTRAM) 50 mg, Oral, Every 12 hours PRN  . Vitamin D3 2,000 Units, Oral, Daily    Radiology:   No results found.  Cardiac Studies:   Right and left heart catheterization 04/17/2018: RA 13/12, mean 11 mmHg.; RV 50/12, EDP 5 mmHg.; PA 49/13, mean 35 mmHg, PA saturation 61%.; PW 28/39, mean 28 mmHg. Aortic saturation was 95%. CO 6.34, CI 2.53. QP/QS 1.00. Aortic peak to peak gradient 30 mmHg, mean 25.1 mmHg, valve area 1.10 cm.  Left main: Mildly calcified. No significant disease. LAD: Mild to moderately calcified. Ostial 50 to 60% stenosis. DFR 0.95, FFR 0.89, not hemodynamically significant. Mild diffuse disease throughout the LAD. Large D1. RI: Mild diffuse disease. CX: Mild calcification, no significant disease. RCA: Very large and ectatic, moderately calcified diffusely. Slow flow is evident. No high-grade stenosis is evident. Large PL and PDA branches.  Impression: No significant coronary artery disease, moderate disease in the ostial LAD. Moderate aortic stenosis. Moderate pulmonary hypertension with elevated moderate LVEDP.  Recommendation: Patient will be diuresed today, he will potentially be discharged home in  the morning, TAVR team is evaluating the patient for possible TAVR.  TEE 04/16/2018: LVEF 35 to 40% % with global hypokinesis. Trileaflet aortic valve  with severe calcific degeneration. Severe aortic stenosis, calculated aortic valve area 0.99 cm. Planimetry valve area 0.7 cm. Mild MR and TR. Mild to moderate aortic atherosclerosis. Peak PG of 37 and mean PG is 21 mm Hg.  Assessment     ICD-10-CM   1. Encounter for care of pacemaker  Z45.018   2. Heart block AV complete (HCC)  I44.2   3. Pacemaker: Medtronic Advisa [id number] dual chamber pacemaker for complete heart block  Z95.0   4. Chronic combined systolic and diastolic CHF, NYHA class 1 (HCC)  I50.42   5. Severe aortic stenosis  I35.0   6. Cirrhosis of liver with ascites, unspecified hepatic cirrhosis type ([place])  K74.60    R18.8     EKG 04/15/2018: Sinus rhythm with first-degree AV block, ventricularly paced rhythm. No further analysis.    Scheduled  In office pacemaker check 05/21/19  Single (S)/Dual (D)/BV: D. Presenting ASVP. Pacemaker dependant:  Y. Underlying CHB. AP 2.2%, VP 100%. AMS Episodes 0 HVR 1 NSVT in Apr 2020 for 2 seconds.  Longevity 6 Years. Magnet rate: >85%. Lead measurements: Stable. Histogram: Low (L)/normal (N)/high (H)  N. Patient activity Low.   Observations: Normal pacemaker function. Changes: None.  Scheduled Remote pacemaker check 03/16/2019:  No AF.FL. No VHR episodes. Health trends do not demonstrate significant abnormality. Battery longevity is 5.5-7.5 years. RA pacing is 3.1 %, RV pacing is 98.3%.  No orders of the defined types were placed in this encounter.   There are no discontinued medications.   Recommendations:   Keith [name]  is a 79 y.o. Caucasian male patient male  with extensive medical history including morbid obesity, diabetes mellitus, hypertension, hyperlipidemia,history of TIA in 2007, cirrhosis of liver due to [facility] and also hepatocellular carcinoma S/O partial  hepatectomy in 2007,  Esophageal varices, chronic thrombocytopenia follows [facility] (Dr Gordy Levan), follicular lymphoma S/P chemotherapy 2009 and sees [facility] once every 6 months, obstructive sleep apnea on CPAP, history of high degree AV block S/P Medtronic dual-chamber pacemaker implantation on 06/25/2016 and pacemaker dependent, moderate to severe aortic stenosis with severe LV systolic dysfunction presents for f/u of CHF and A. Fib. Not on anticoagulation due to bleeding risk and only on ASA 81 mg.   Is presently doing well, has occasional episodes of wheezing and also shortness of breath I suspect is related to acute decompensated heart failure.  I discussed with his wife that his blood pressure is on the borderline low and he has not been able to tolerate beta-blockers in the past as he developed profound hypotension.  He is also not on an ACE inhibitor due to this.  Presently tolerating spironolactone and has done really well.  Continue the same, overall poor long-term prognosis and patient's wife is aware as well.  Pacemaker is functioning normally.  I reviewed the labs, CBC reveals improved hemoglobin.  Renal function is stable.  No change in the medications were done and advised her to give extra dose of furosemide in case he gets shortness of breath or weight goes up by 2 pounds or more.  I will see him back in 6 months.  His TSH is mildly abnormal, he has labs pending with his PCP and his wife will make sure he gets TSH along with T3 and T4.  Adrian Prows, MD, [facility] 05/21/2019, 1:41 PM [facility] [place] Office: [phone]

## 2019-05-23 ENCOUNTER — Ambulatory Visit: Payer: Medicare Other | Attending: Internal Medicine

## 2019-05-23 DIAGNOSIS — Z23 Encounter for immunization: Secondary | ICD-10-CM | POA: Insufficient documentation

## 2019-05-23 NOTE — Progress Notes (Signed)
   Covid-19 Vaccination Clinic  Name:  Keith [name]    MRN: [id number] DOB: [birth year]  05/23/2019  [name] was observed post Covid-19 immunization for 15 minutes without incidence. He was provided with Vaccine Information Sheet and instruction to access the V-Safe system.   [name] was instructed to call 911 with any severe reactions post vaccine: Marland Kitchen Difficulty breathing  . Swelling of your face and throat  . A fast heartbeat  . A bad rash all over your body  . Dizziness and weakness    Immunizations Administered    Name Date Dose VIS Date Route   Pfizer COVID-19 Vaccine 05/23/2019  1:28 PM 0.3 mL 03/19/2019 Intramuscular   Manufacturer: [place]   Lot: [id number]   [place]: [id number]

## 2019-05-26 DIAGNOSIS — D649 Anemia, unspecified: Secondary | ICD-10-CM | POA: Diagnosis not present

## 2019-05-26 DIAGNOSIS — K7460 Unspecified cirrhosis of liver: Secondary | ICD-10-CM | POA: Diagnosis not present

## 2019-05-26 DIAGNOSIS — K7581 Nonalcoholic steatohepatitis (NASH): Secondary | ICD-10-CM | POA: Diagnosis not present

## 2019-05-26 DIAGNOSIS — R5383 Other fatigue: Secondary | ICD-10-CM | POA: Diagnosis not present

## 2019-06-03 DIAGNOSIS — R5383 Other fatigue: Secondary | ICD-10-CM | POA: Diagnosis not present

## 2019-06-03 DIAGNOSIS — K7460 Unspecified cirrhosis of liver: Secondary | ICD-10-CM | POA: Diagnosis not present

## 2019-06-03 DIAGNOSIS — D649 Anemia, unspecified: Secondary | ICD-10-CM | POA: Diagnosis not present

## 2019-06-03 DIAGNOSIS — K7581 Nonalcoholic steatohepatitis (NASH): Secondary | ICD-10-CM | POA: Diagnosis not present

## 2019-06-09 IMAGING — CT CT CERVICAL SPINE WITHOUT CONTRAST
3 of 4 series · 13 of 33 positions shown, 16 images · non-contrast
Comparison: Cervical spine CT 07/09/2012.

CLINICAL DATA: Midline occipital headache. Fall without loss of
consciousness. Recent weakness.

EXAM:
CT CERVICAL SPINE WITHOUT CONTRAST
TECHNIQUE: Multidetector CT imaging of the cervical spine was performed without
intravenous contrast. Multiplanar CT image reconstructions were also
generated.

[Series 6: sag bone · sagittal · 0.39mm/px · 5 of 61 slices shown, 6 images]
[im 21/61  bone]
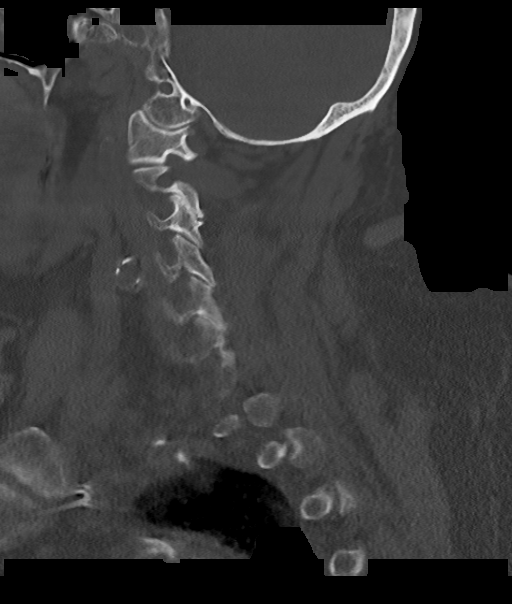
[im 26/61  bone]
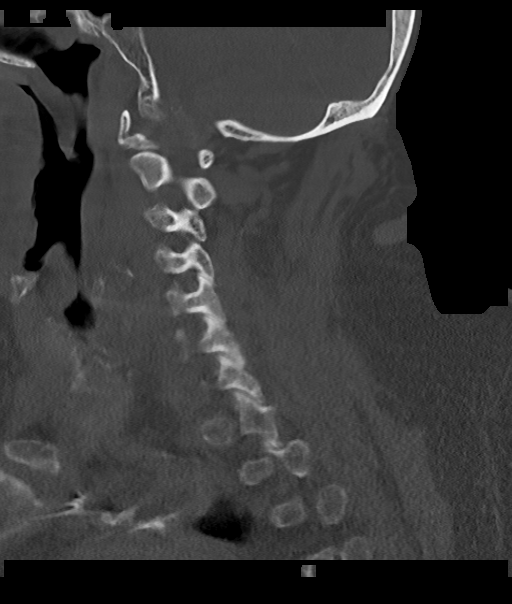
[im 31/61  soft-tissue]
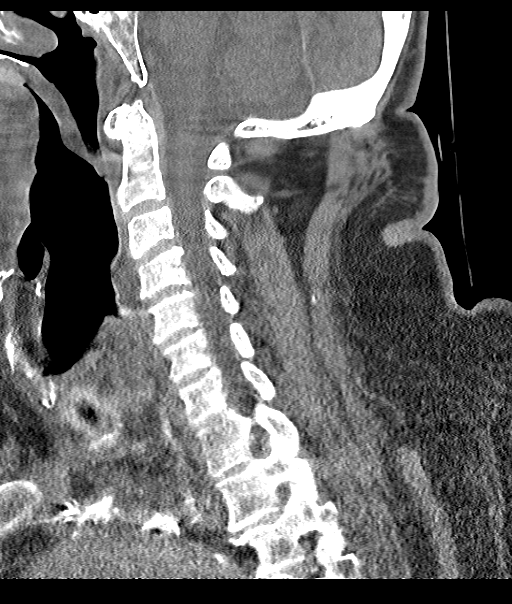
[im 31/61  bone]
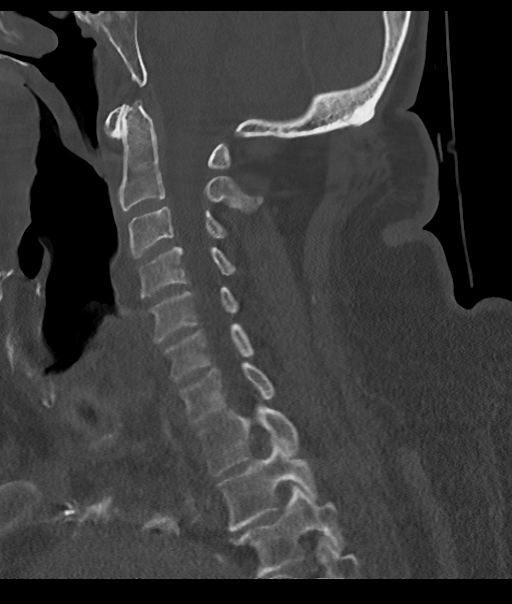
[im 36/61  bone]
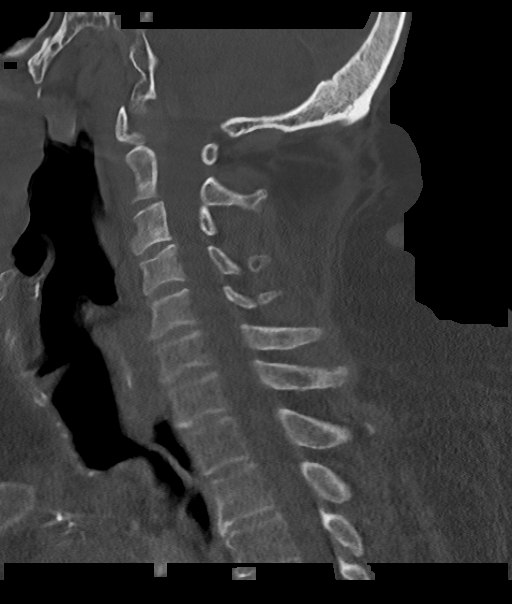
[im 41/61  bone]
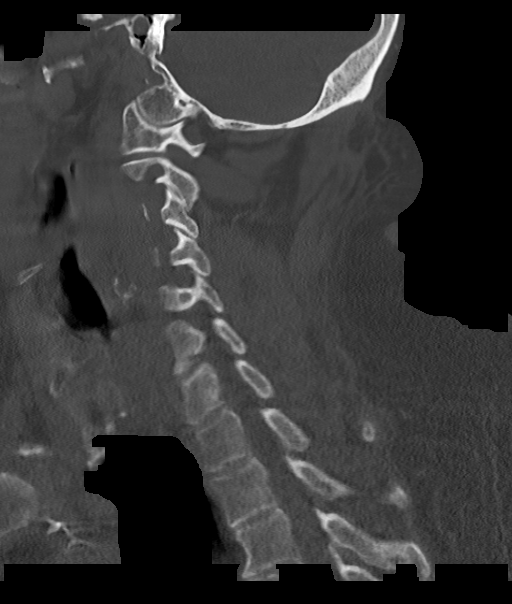

[Series 7: cor bone · coronal · 0.32mm/px · 3 of 61 slices shown]
[im 13/61  bone]
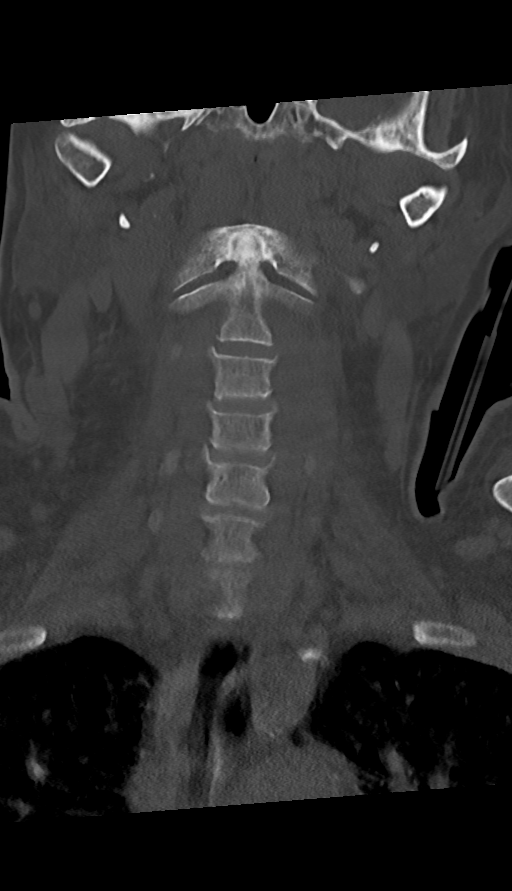
[im 25/61  bone]
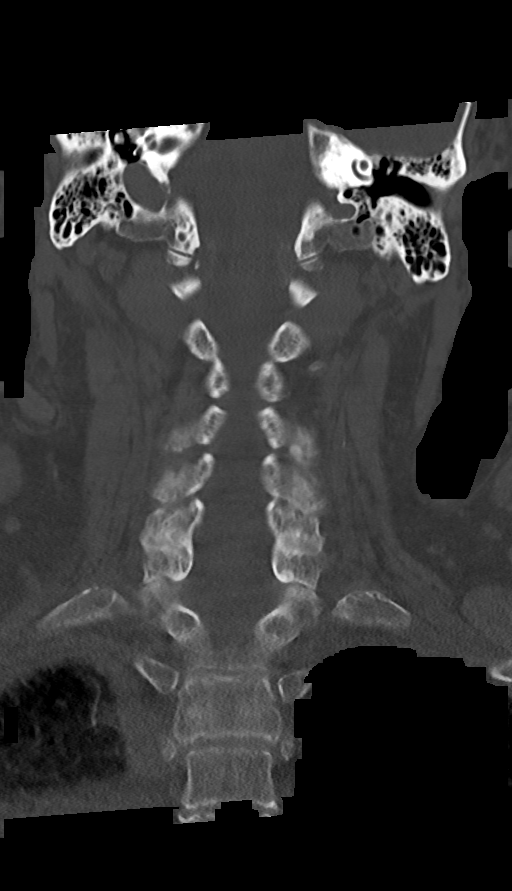
[im 37/61  bone]
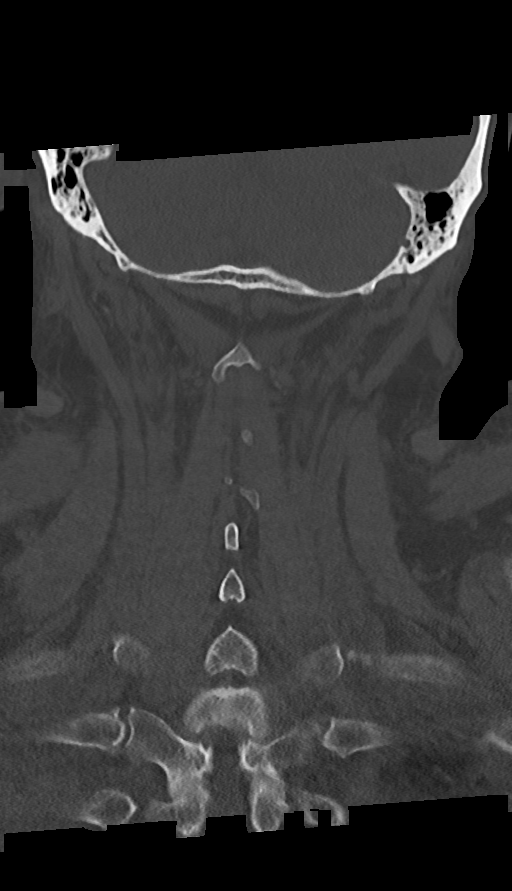

[Series 8: orthogonal axials · axial · 0.21mm/px · z∈[-323,-194]mm · 5 of 104 slices shown, 7 images]
[im 18/104  soft-tissue]
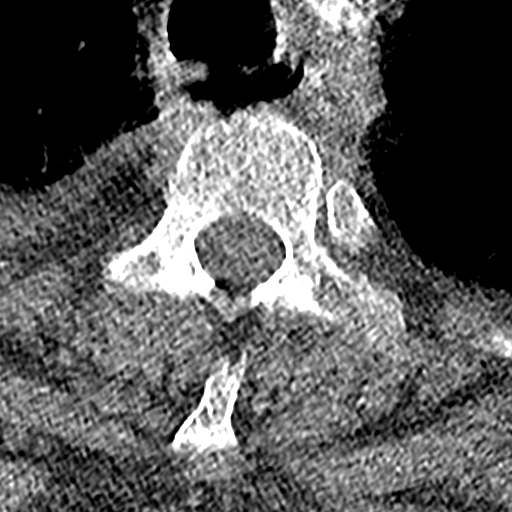
[im 18/104  bone]
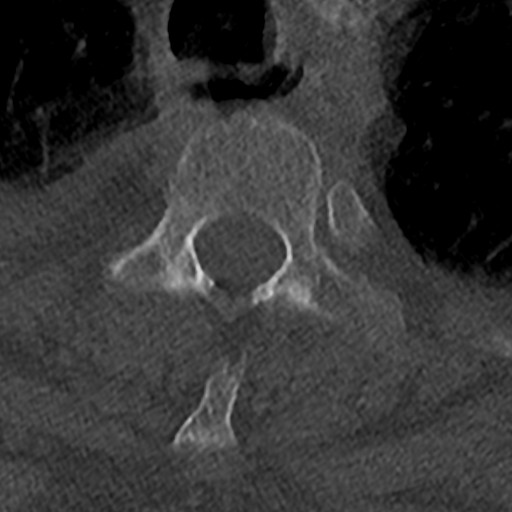
[im 35/104  bone]
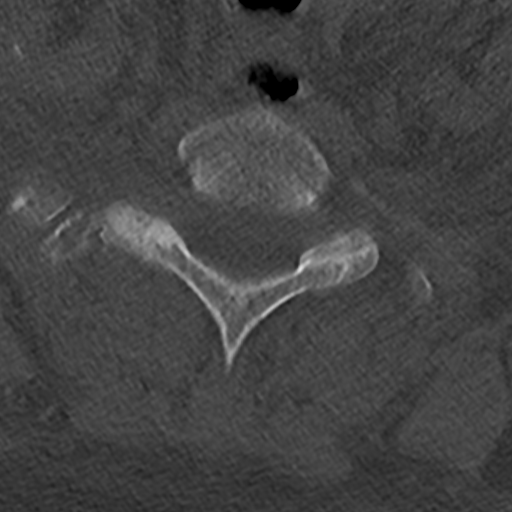
[im 52/104  bone]
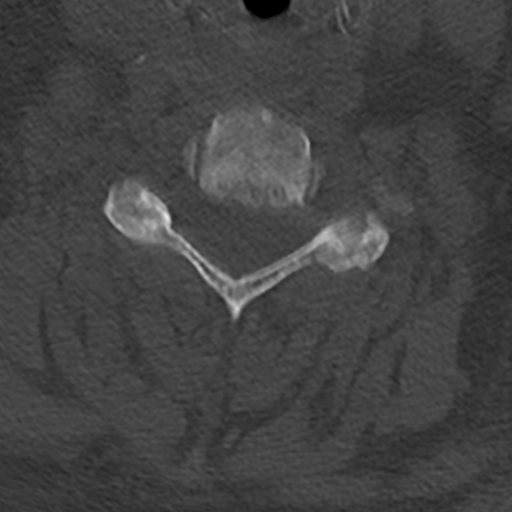
[im 69/104  bone]
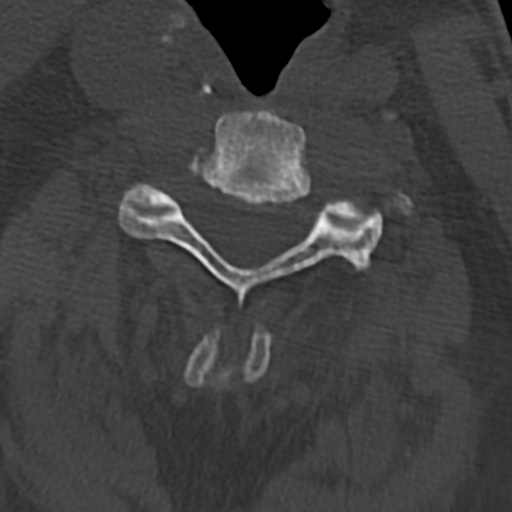
[im 86/104  soft-tissue]
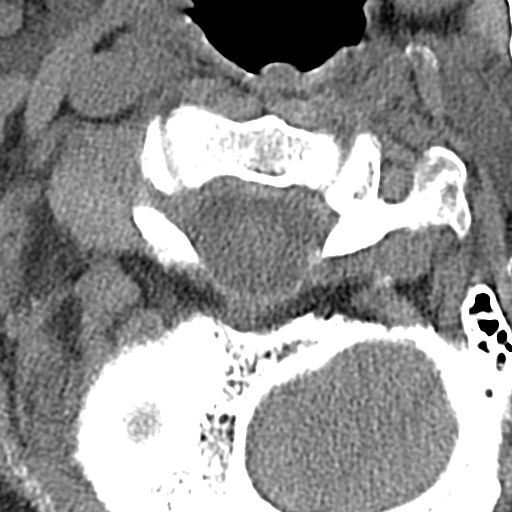
[im 86/104  bone]
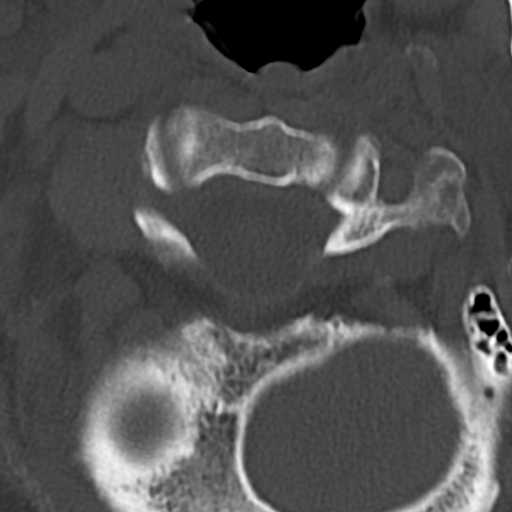

[13 of 33 positions shown; findings below may reference images not displayed]

FINDINGS: Alignment: Straightening without focal angulation or listhesis.

Skull base and vertebrae: No evidence of acute cervical spine
fracture or traumatic subluxation.

Soft tissues and spinal canal: No prevertebral fluid or swelling. No
visible canal hematoma.

Disc levels: There is no large disc herniation or significant spinal
stenosis. The disc spaces are largely preserved. There is minimal
uncinate spurring and facet hypertrophy.

Upper chest: Small bilateral pleural effusions are noted. Patient
has a pacemaker and emphysematous changes within the visualized
lungs.

Other: Bilateral carotid atherosclerosis.
IMPRESSION: 1. No evidence of acute cervical spine fracture, traumatic
subluxation or static signs of instability.
2. Carotid atherosclerosis, emphysema and bilateral pleural
effusions noted.

## 2019-06-13 ENCOUNTER — Inpatient Hospital Stay (HOSPITAL_COMMUNITY)
Admission: EM | Admit: 2019-06-13 | Discharge: 2019-06-16 | DRG: 292 | Disposition: A | Discharge status: Hospice | Payer: No Typology Code available for payment source | Attending: Internal Medicine | Admitting: Internal Medicine

## 2019-06-13 ENCOUNTER — Encounter (HOSPITAL_COMMUNITY): Payer: Self-pay

## 2019-06-13 ENCOUNTER — Other Ambulatory Visit: Payer: Self-pay

## 2019-06-13 ENCOUNTER — Emergency Department (HOSPITAL_COMMUNITY): Payer: No Typology Code available for payment source

## 2019-06-13 DIAGNOSIS — R0602 Shortness of breath: Secondary | ICD-10-CM | POA: Diagnosis not present

## 2019-06-13 DIAGNOSIS — Z6841 Body Mass Index (BMI) 40.0 and over, adult: Secondary | ICD-10-CM

## 2019-06-13 DIAGNOSIS — R140 Abdominal distension (gaseous): Secondary | ICD-10-CM | POA: Diagnosis not present

## 2019-06-13 DIAGNOSIS — E785 Hyperlipidemia, unspecified: Secondary | ICD-10-CM | POA: Diagnosis present

## 2019-06-13 DIAGNOSIS — K7460 Unspecified cirrhosis of liver: Secondary | ICD-10-CM | POA: Diagnosis present

## 2019-06-13 DIAGNOSIS — K766 Portal hypertension: Secondary | ICD-10-CM | POA: Diagnosis present

## 2019-06-13 DIAGNOSIS — N400 Enlarged prostate without lower urinary tract symptoms: Secondary | ICD-10-CM | POA: Diagnosis present

## 2019-06-13 DIAGNOSIS — Z79891 Long term (current) use of opiate analgesic: Secondary | ICD-10-CM

## 2019-06-13 DIAGNOSIS — Z794 Long term (current) use of insulin: Secondary | ICD-10-CM

## 2019-06-13 DIAGNOSIS — K7290 Hepatic failure, unspecified without coma: Secondary | ICD-10-CM | POA: Diagnosis present

## 2019-06-13 DIAGNOSIS — R062 Wheezing: Secondary | ICD-10-CM | POA: Diagnosis not present

## 2019-06-13 DIAGNOSIS — I482 Chronic atrial fibrillation, unspecified: Secondary | ICD-10-CM | POA: Diagnosis present

## 2019-06-13 DIAGNOSIS — Z823 Family history of stroke: Secondary | ICD-10-CM

## 2019-06-13 DIAGNOSIS — I5021 Acute systolic (congestive) heart failure: Secondary | ICD-10-CM

## 2019-06-13 DIAGNOSIS — E119 Type 2 diabetes mellitus without complications: Secondary | ICD-10-CM | POA: Diagnosis present

## 2019-06-13 DIAGNOSIS — C229 Malignant neoplasm of liver, not specified as primary or secondary: Secondary | ICD-10-CM | POA: Diagnosis present

## 2019-06-13 DIAGNOSIS — Z8673 Personal history of transient ischemic attack (TIA), and cerebral infarction without residual deficits: Secondary | ICD-10-CM | POA: Diagnosis not present

## 2019-06-13 DIAGNOSIS — Z66 Do not resuscitate: Secondary | ICD-10-CM | POA: Diagnosis present

## 2019-06-13 DIAGNOSIS — K7682 Hepatic encephalopathy: Secondary | ICD-10-CM | POA: Diagnosis present

## 2019-06-13 DIAGNOSIS — Z20822 Contact with and (suspected) exposure to covid-19: Secondary | ICD-10-CM | POA: Diagnosis present

## 2019-06-13 DIAGNOSIS — E1136 Type 2 diabetes mellitus with diabetic cataract: Secondary | ICD-10-CM | POA: Diagnosis present

## 2019-06-13 DIAGNOSIS — I959 Hypotension, unspecified: Secondary | ICD-10-CM | POA: Diagnosis not present

## 2019-06-13 DIAGNOSIS — I8510 Secondary esophageal varices without bleeding: Secondary | ICD-10-CM | POA: Diagnosis present

## 2019-06-13 DIAGNOSIS — E1169 Type 2 diabetes mellitus with other specified complication: Secondary | ICD-10-CM | POA: Diagnosis not present

## 2019-06-13 DIAGNOSIS — I5023 Acute on chronic systolic (congestive) heart failure: Secondary | ICD-10-CM | POA: Diagnosis not present

## 2019-06-13 DIAGNOSIS — I8390 Asymptomatic varicose veins of unspecified lower extremity: Secondary | ICD-10-CM | POA: Diagnosis present

## 2019-06-13 DIAGNOSIS — Z8505 Personal history of malignant neoplasm of liver: Secondary | ICD-10-CM

## 2019-06-13 DIAGNOSIS — Z7982 Long term (current) use of aspirin: Secondary | ICD-10-CM

## 2019-06-13 DIAGNOSIS — Z79899 Other long term (current) drug therapy: Secondary | ICD-10-CM

## 2019-06-13 DIAGNOSIS — I350 Nonrheumatic aortic (valve) stenosis: Secondary | ICD-10-CM

## 2019-06-13 DIAGNOSIS — C220 Liver cell carcinoma: Secondary | ICD-10-CM | POA: Diagnosis present

## 2019-06-13 DIAGNOSIS — D696 Thrombocytopenia, unspecified: Secondary | ICD-10-CM | POA: Diagnosis present

## 2019-06-13 DIAGNOSIS — R05 Cough: Secondary | ICD-10-CM | POA: Diagnosis not present

## 2019-06-13 DIAGNOSIS — E669 Obesity, unspecified: Secondary | ICD-10-CM | POA: Diagnosis not present

## 2019-06-13 DIAGNOSIS — R0902 Hypoxemia: Secondary | ICD-10-CM | POA: Diagnosis not present

## 2019-06-13 DIAGNOSIS — R52 Pain, unspecified: Secondary | ICD-10-CM | POA: Diagnosis not present

## 2019-06-13 HISTORY — DX: Unspecified atrial fibrillation: I48.91

## 2019-06-13 LAB — COMPREHENSIVE METABOLIC PANEL
ALT: 27 U/L (ref 0–44)
AST: 48 U/L — ABNORMAL HIGH (ref 15–41)
Albumin: 2.7 g/dL — ABNORMAL LOW (ref 3.5–5.0)
Alkaline Phosphatase: 103 U/L (ref 38–126)
Anion gap: 9 (ref 5–15)
BUN: 19 mg/dL (ref 8–23)
CO2: 29 mmol/L (ref 22–32)
Calcium: 8.3 mg/dL — ABNORMAL LOW (ref 8.9–10.3)
Chloride: 103 mmol/L (ref 98–111)
Creatinine, Ser: 1.17 mg/dL (ref 0.61–1.24)
GFR calc Af Amer: >60 mL/min (ref >60)
GFR calc non Af Amer: >60 mL/min (ref >60)
Glucose, Bld: 159 mg/dL — ABNORMAL HIGH (ref 70–99)
Potassium: 4.5 mmol/L (ref 3.5–5.1)
Sodium: 141 mmol/L (ref 135–145)
Total Bilirubin: 3 mg/dL — ABNORMAL HIGH (ref 0.3–1.2)
Total Protein: 6 g/dL — ABNORMAL LOW (ref 6.5–8.1)

## 2019-06-13 LAB — BRAIN NATRIURETIC PEPTIDE: B Natriuretic Peptide: 530.9 pg/mL — ABNORMAL HIGH (ref 0.0–100.0)

## 2019-06-13 LAB — SARS CORONAVIRUS 2 (TAT 6-24 HRS): SARS Coronavirus 2: NEGATIVE

## 2019-06-13 LAB — GLUCOSE, CAPILLARY
Glucose-Capillary: 103 mg/dL — ABNORMAL HIGH (ref 70–99)
Glucose-Capillary: 101 mg/dL — ABNORMAL HIGH (ref 70–99)

## 2019-06-13 LAB — CBC
HCT: 36.1 % — ABNORMAL LOW (ref 39.0–52.0)
Hemoglobin: 11.4 g/dL — ABNORMAL LOW (ref 13.0–17.0)
MCH: 31.7 pg (ref 26.0–34.0)
MCHC: 31.6 g/dL (ref 30.0–36.0)
MCV: 100.3 fL — ABNORMAL HIGH (ref 80.0–100.0)
Platelets: 57 10*3/uL — ABNORMAL LOW (ref 150–400)
RBC: 3.6 MIL/uL — ABNORMAL LOW (ref 4.22–5.81)
RDW: 15.7 % — ABNORMAL HIGH (ref 11.5–15.5)
WBC: 10.9 10*3/uL — ABNORMAL HIGH (ref 4.0–10.5)
nRBC: 0 % (ref 0.0–0.2)

## 2019-06-13 LAB — CBG MONITORING, ED: Glucose-Capillary: 123 mg/dL — ABNORMAL HIGH (ref 70–99)

## 2019-06-13 LAB — TROPONIN I (HIGH SENSITIVITY)
Troponin I (High Sensitivity): 34 ng/L — ABNORMAL HIGH (ref <18)
Troponin I (High Sensitivity): 44 ng/L — ABNORMAL HIGH (ref <18)

## 2019-06-13 LAB — POC SARS CORONAVIRUS 2 AG -  ED: SARS Coronavirus 2 Ag: NEGATIVE

## 2019-06-13 MED ORDER — ALBUTEROL SULFATE (2.5 MG/3ML) 0.083% IN NEBU: 2.5000 mg | INHALATION_SOLUTION | RESPIRATORY_TRACT | Status: DC | PRN

## 2019-06-13 MED ORDER — CYANOCOBALAMIN 1000 MCG/ML IJ SOLN
1000.0000 ug | INTRAMUSCULAR | Status: DC
  Filled 2019-06-13: qty 1

## 2019-06-13 MED ORDER — FUROSEMIDE 10 MG/ML IJ SOLN
40.0000 mg | Freq: Once | INTRAMUSCULAR | Status: AC
  Administered 2019-06-13: 40 mg via INTRAVENOUS
  Filled 2019-06-13: qty 4

## 2019-06-13 MED ORDER — MEMANTINE HCL 10 MG PO TABS
20.0000 mg | ORAL_TABLET | Freq: Every day | ORAL | Status: DC
  Administered 2019-06-13 – 2019-06-14 (×2): 20 mg via ORAL
  Filled 2019-06-13 (×2): qty 2

## 2019-06-13 MED ORDER — POLYVINYL ALCOHOL 1.4 % OP SOLN
1.0000 [drp] | Freq: Two times a day (BID) | OPHTHALMIC | Status: DC | PRN
  Filled 2019-06-13: qty 15

## 2019-06-13 MED ORDER — TAMSULOSIN HCL 0.4 MG PO CAPS
0.4000 mg | ORAL_CAPSULE | Freq: Every evening | ORAL | Status: DC
  Administered 2019-06-13 – 2019-06-15 (×3): 0.4 mg via ORAL
  Filled 2019-06-13 (×3): qty 1

## 2019-06-13 MED ORDER — TRAMADOL HCL 50 MG PO TABS: 50.0000 mg | ORAL_TABLET | Freq: Four times a day (QID) | ORAL | Status: DC | PRN

## 2019-06-13 MED ORDER — VITAMIN D 25 MCG (1000 UNIT) PO TABS
2000.0000 [IU] | ORAL_TABLET | Freq: Every day | ORAL | Status: DC
  Administered 2019-06-14 – 2019-06-15 (×2): 2000 [IU] via ORAL
  Filled 2019-06-13 (×2): qty 2

## 2019-06-13 MED ORDER — FOLIC ACID 1 MG PO TABS
1.0000 mg | ORAL_TABLET | Freq: Every day | ORAL | Status: DC
  Administered 2019-06-14 – 2019-06-15 (×2): 1 mg via ORAL
  Filled 2019-06-13 (×2): qty 1

## 2019-06-13 MED ORDER — ACETAMINOPHEN 325 MG PO TABS: 650.0000 mg | ORAL_TABLET | Freq: Four times a day (QID) | ORAL | Status: DC | PRN

## 2019-06-13 MED ORDER — INSULIN GLARGINE 100 UNIT/ML ~~LOC~~ SOLN
20.0000 [IU] | Freq: Two times a day (BID) | SUBCUTANEOUS | Status: DC
  Administered 2019-06-14 – 2019-06-15 (×4): 20 [IU] via SUBCUTANEOUS
  Filled 2019-06-13 (×8): qty 0.2

## 2019-06-13 MED ORDER — PREGABALIN 100 MG PO CAPS
300.0000 mg | ORAL_CAPSULE | Freq: Two times a day (BID) | ORAL | Status: DC
  Administered 2019-06-13 – 2019-06-15 (×4): 300 mg via ORAL
  Filled 2019-06-13 (×4): qty 3

## 2019-06-13 MED ORDER — ONDANSETRON HCL 4 MG/2ML IJ SOLN: 4.0000 mg | Freq: Four times a day (QID) | INTRAMUSCULAR | Status: DC | PRN

## 2019-06-13 MED ORDER — PANTOPRAZOLE SODIUM 40 MG PO TBEC
40.0000 mg | DELAYED_RELEASE_TABLET | Freq: Every day | ORAL | Status: DC
  Administered 2019-06-14 – 2019-06-15 (×2): 40 mg via ORAL
  Filled 2019-06-13: qty 2
  Filled 2019-06-13: qty 1

## 2019-06-13 MED ORDER — FUROSEMIDE 10 MG/ML IJ SOLN
20.0000 mg | Freq: Two times a day (BID) | INTRAMUSCULAR | Status: DC
  Administered 2019-06-13 – 2019-06-15 (×3): 20 mg via INTRAVENOUS
  Filled 2019-06-13 (×3): qty 2

## 2019-06-13 MED ORDER — ALBUTEROL SULFATE HFA 108 (90 BASE) MCG/ACT IN AERS: 2.0000 | INHALATION_SPRAY | RESPIRATORY_TRACT | Status: DC | PRN

## 2019-06-13 MED ORDER — ACETAMINOPHEN 650 MG RE SUPP: 650.0000 mg | Freq: Four times a day (QID) | RECTAL | Status: DC | PRN

## 2019-06-13 MED ORDER — FINASTERIDE 5 MG PO TABS
5.0000 mg | ORAL_TABLET | Freq: Every evening | ORAL | Status: DC
  Administered 2019-06-13 – 2019-06-15 (×3): 5 mg via ORAL
  Filled 2019-06-13 (×3): qty 1

## 2019-06-13 MED ORDER — INSULIN ASPART 100 UNIT/ML ~~LOC~~ SOLN: 0.0000 [IU] | Freq: Every day | SUBCUTANEOUS | Status: DC

## 2019-06-13 MED ORDER — ONDANSETRON HCL 4 MG PO TABS: 4.0000 mg | ORAL_TABLET | Freq: Four times a day (QID) | ORAL | Status: DC | PRN

## 2019-06-13 MED ORDER — MECLIZINE HCL 25 MG PO TABS: 25.0000 mg | ORAL_TABLET | Freq: Four times a day (QID) | ORAL | Status: DC | PRN

## 2019-06-13 MED ORDER — ASPIRIN EC 81 MG PO TBEC
81.0000 mg | DELAYED_RELEASE_TABLET | Freq: Every day | ORAL | Status: DC
  Administered 2019-06-14 – 2019-06-16 (×3): 81 mg via ORAL
  Filled 2019-06-13 (×3): qty 1

## 2019-06-13 MED ORDER — RIFAXIMIN 550 MG PO TABS
550.0000 mg | ORAL_TABLET | Freq: Two times a day (BID) | ORAL | Status: DC
  Administered 2019-06-13 – 2019-06-16 (×6): 550 mg via ORAL
  Filled 2019-06-13 (×7): qty 1

## 2019-06-13 MED ORDER — INSULIN ASPART 100 UNIT/ML ~~LOC~~ SOLN
0.0000 [IU] | Freq: Three times a day (TID) | SUBCUTANEOUS | Status: DC
  Administered 2019-06-14: 2 [IU] via SUBCUTANEOUS
  Administered 2019-06-15: 1 [IU] via SUBCUTANEOUS

## 2019-06-13 NOTE — ED Notes (Signed)
Patient transported to X-ray 

## 2019-06-13 NOTE — H&P (Addendum)
History and Physical    Keith [name]:[id number] DOB: 1940/05/17 DOA: 06/13/2019  I have briefly reviewed the patient's prior medical records in [place]  PCP: Reubin Milan, MD  Patient coming from: Home with wife  Chief Complaint: Sudden onset of swelling and shortness of breath  HPI: Keith [name] is a 79 y.o. male with medical history significant of cirrhosis of the liver with [place] (follows at [facility]), diabetes type 2 on insulin,moderate to severe aortic stenosis with severe LV systolic dysfunction, and  obesity.  Per chart review as patient is a poor historian, wife states that he has been wheezing for 3 to 4 days.  As per prior direction from Dr. Einar Gip she increased his Lasix to 40 mg daily from 20 mg daily for 3 days.  But this did not fix his issues.  He was instructed to come to the ER for further evaluation of heart failure.  Chart review also shows that the wife was interested in getting palliative care in place/possibly hospice because they have decided not to treat his liver cancer any further.  Patient follows with Dr. Einar Gip for his cardiac issues.  He was seen on May 21, 2019.  During that visit he stated that he occasionally had episodes of wheezing and also shortness of breath that Dr. Einar Gip thought was related to acute decompensated heart failure.  Unfortunately his blood pressure was on the low side of normal and he has not been able to tolerate beta-blocker or an ACE inhibitor's.  Dr. Einar Gip spoke with the wife about his overall poor long-term prognosis.  Wife was instructed to weigh daily and then give an extra dose of furosemide if his weight goes up by 2 pounds or more or he gets short of breath.  In early 2020 patient was evaluated for TAVR but CT came back with advanced cirrhosis and questionable peritoneal spread of malignancy.  TAVR evaluation was held until he was followed up with [facility]    Review of Systems: As per HPI otherwise 10  point review of systems negative.   Past Medical History:  Diagnosis Date  . Atrial fibrillation ([place])    per note from Dr. Einar Gip  . Cirrhosis of liver (HCC)    NASH  . Diabetes mellitus   . Encounter for care of pacemaker 11/15/2018  . Heart block AV complete ([place]) 04/15/2018  . Hepatic cancer ([place])   . Pacemaker: Medtronic Advisa [id number] dual chamber pacemaker for complete heart block 06/25/2016  . Prostate hypertrophy   . Shortness of breath   . Stroke ([place])   . Varicose veins   . Weakness of both legs     Past Surgical History:  Procedure Laterality Date  . APPENDECTOMY    . EYE SURGERY     bilateral cataract   . INTRAVASCULAR PRESSURE WIRE/FFR STUDY N/A 04/17/2018   Procedure: INTRAVASCULAR PRESSURE WIRE/FFR STUDY;  Surgeon: Adrian Prows, MD;  Location: [facility];  Service: Cardiovascular;  Laterality: N/A;  . left arm surgery     age 60  . left hand surgery     tendons ripped on left hand  . LIVER RESECTION    . RIGHT/LEFT HEART CATH AND CORONARY ANGIOGRAPHY N/A 04/17/2018   Procedure: RIGHT/LEFT HEART CATH AND CORONARY ANGIOGRAPHY;  Surgeon: Adrian Prows, MD;  Location: [facility];  Service: Cardiovascular;  Laterality: N/A;  . TEE WITHOUT CARDIOVERSION N/A 04/16/2018   Procedure: TRANSESOPHAGEAL ECHOCARDIOGRAM (TEE);  Surgeon: Adrian Prows,  MD;  Location: [facility];  Service: Cardiovascular;  Laterality: N/A;  . TONSILLECTOMY    . TOTAL KNEE ARTHROPLASTY Left 12/14/2012   Procedure: LEFT TOTAL KNEE ARTHROPLASTY;  Surgeon: Gearlean Alf, MD;  Location: [facility];  Service: Orthopedics;  Laterality: Left;     reports that he quit smoking about 51 years ago. His smoking use included cigarettes. He has a 45.00 pack-year smoking history. He has never used smokeless tobacco. He reports that he does not drink alcohol or use drugs.  Allergies  Allergen Reactions  . Contrast Media [Iodinated Diagnostic Agents] Rash    Cannot have contrast  . Fentanyl Itching and Rash  .  Gadolinium Rash  . Iodine Rash  . Merbromin Rash  . Other Rash    "Opioids"    Family History  Problem Relation Age of Onset  . Stroke Father   . Heart failure Brother 24       Identical twin  . Aortic stenosis Mother     Prior to Admission medications   Medication Sig Start Date End Date Taking? Authorizing Provider  albuterol (PROVENTIL HFA;VENTOLIN HFA) 108 (90 BASE) MCG/ACT inhaler Inhale 2 puffs into the lungs every 4 (four) hours as needed for wheezing or shortness of breath.    Yes [provider]  amitriptyline (ELAVIL) 25 MG tablet Take 25 mg by mouth at bedtime.    Yes [provider]  aspirin EC 81 MG EC tablet Take 1 tablet (81 mg total) by mouth daily. 04/19/18  Yes Mariel Aloe, MD  Cholecalciferol (VITAMIN D3) 50 MCG (2000 UT) TABS Take 2,000 Units by mouth daily.   Yes [provider]  cyanocobalamin (,VITAMIN B-12,) 1000 MCG/ML injection Inject 1,000 mcg into the muscle every 30 (thirty) days.   Yes [provider]  finasteride (PROSCAR) 5 MG tablet Take 5 mg by mouth every evening.    Yes [provider]  folic acid (FOLVITE) 1 MG tablet Take 1 mg by mouth daily. 02/13/09  Yes [provider]  furosemide (LASIX) 20 MG tablet Take 20 mg by mouth daily.   Yes [provider]  hydrocerin (EUCERIN) CREA Apply 1 application topically daily as needed (dry skin).    Yes [provider]  insulin aspart (NOVOLOG) 100 UNIT/ML injection Inject 5-30 Units into the skin See admin instructions. Inject 5-30 units into the skin three times a day before meals, per sliding scale   Yes [provider]  insulin glargine (LANTUS) 100 UNIT/ML injection Inject 40 Units into the skin 2 (two) times daily. Inject 40 units into the skin 2 times a day- 10 AM and bedtime   Yes [provider]  meclizine (ANTIVERT) 25 MG tablet Take 25 mg by mouth 4 (four) times daily as needed for dizziness.  04/19/19  Yes  [provider]  memantine (NAMENDA) 10 MG tablet Take 20 mg by mouth at bedtime.   Yes [provider]  pantoprazole (PROTONIX) 40 MG tablet Take 40 mg by mouth daily.   Yes [provider]  polyvinyl alcohol (LIQUIFILM TEARS) 1.4 % ophthalmic solution Place 1 drop into both eyes 2 (two) times daily as needed (dry eyes).   Yes [provider]  pregabalin (LYRICA) 300 MG capsule Take 300 mg by mouth 2 (two) times daily.   Yes [provider]  rifaximin (XIFAXAN) 550 MG TABS tablet Take 550 mg by mouth 2 (two) times daily.   Yes [provider]  rivastigmine (EXELON)  1.5 MG capsule Take 6 mg by mouth. 1 tablet in the AM, one tablet in the PM   Yes [provider]  simvastatin (ZOCOR) 20 MG tablet Take 10 mg by mouth every evening.   Yes [provider]  spironolactone (ALDACTONE) 25 MG tablet Take 2 tablets (50 mg total) by mouth daily. 06/05/18 06/13/19 Yes Adrian Prows, MD  tamsulosin (FLOMAX) 0.4 MG CAPS Take 0.4 mg by mouth every evening.    Yes [provider]  traMADol (ULTRAM) 50 MG tablet Take 1 tablet (50 mg total) by mouth every 12 (twelve) hours as needed for moderate pain. Patient taking differently: Take 50 mg by mouth every 6 (six) hours as needed (for pain).  12/19/17  Yes Geradine Girt, DO    Physical Exam: Vitals:   06/13/19 1330 06/13/19 1400 06/13/19 1444 06/13/19 1500  BP: 108/71 (!) 93/58 101/63 99/71  Pulse: 95 94 93 94  Resp: (!) 21 19 20 18   Temp:      TempSrc:      SpO2: 95% 94% 97% 94%  Weight:      Height:          Constitutional: Obese male, in mild respiratory distress with increased work Eyes: PERRL, lids and conjunctivae normal ENMT: Mucous membranes are moist. Posterior pharynx clear of any exudate or lesions.Normal dentition.  Neck: normal, supple, no masses, no thyromegaly Respiratory: Wheezing appears to be in the upper airway, mild increased work of breathing, no wheezing  in the lower lung fields cardiovascular: Regular rate and rhythm, no murmurs / rubs / gallops.  Positive lower extremity edema Abdomen: Positive bowel sounds, soft but distended Musculoskeletal: no clubbing / cyanosis. Normal muscle tone.  Neurologic: Moves all 4 extremities Psychiatric: Alert and oriented but tends to ramble  Labs on Admission: I have personally reviewed following labs and imaging studies  CBC: Recent Labs  Lab 06/13/19 1320  WBC 10.9*  HGB 11.4*  HCT 36.1*  MCV 100.3*  PLT 57*   Basic Metabolic Panel: Recent Labs  Lab 06/13/19 1320  NA 141  K 4.5  CL 103  CO2 29  GLUCOSE 159*  BUN 19  CREATININE 1.17  CALCIUM 8.3*   GFR: Estimated Creatinine Clearance: 76 mL/min (by C-G formula based on SCr of 1.17 mg/dL). Liver Function Tests: Recent Labs  Lab 06/13/19 1320  AST 48*  ALT 27  ALKPHOS 103  BILITOT 3.0*  PROT 6.0*  ALBUMIN 2.7*   No results for input(s): LIPASE, AMYLASE in the last 168 hours. No results for input(s): AMMONIA in the last 168 hours. Coagulation Profile: No results for input(s): INR, PROTIME in the last 168 hours. Cardiac Enzymes: No results for input(s): CKTOTAL, CKMB, CKMBINDEX, TROPONINI in the last 168 hours. BNP (last 3 results) No results for input(s): PROBNP in the last 8760 hours. HbA1C: No results for input(s): HGBA1C in the last 72 hours. CBG: Recent Labs  Lab 06/13/19 1454  GLUCAP 123*   Lipid Profile: No results for input(s): CHOL, HDL, LDLCALC, TRIG, CHOLHDL, LDLDIRECT in the last 72 hours. Thyroid Function Tests: No results for input(s): TSH, T4TOTAL, FREET4, T3FREE, THYROIDAB in the last 72 hours. Anemia Panel: No results for input(s): VITAMINB12, FOLATE, FERRITIN, TIBC, IRON, RETICCTPCT in the last 72 hours. Urine analysis:    Component Value Date/Time   COLORURINE STRAW (A) 09/11/2018 1732   APPEARANCEUR CLEAR 09/11/2018 1732   LABSPEC 1.005 09/11/2018 1732   PHURINE 7.0 09/11/2018 1732    GLUCOSEU NEGATIVE 09/11/2018  [place] 09/11/2018 [place] 09/11/2018 1732   KETONESUR NEGATIVE 09/11/2018 1732   PROTEINUR NEGATIVE 09/11/2018 1732   UROBILINOGEN 0.2 12/03/2012 1338   NITRITE NEGATIVE 09/11/2018 1732   LEUKOCYTESUR NEGATIVE 09/11/2018 1732     Radiological Exams on Admission: DG Chest 2 View  Result Date: 06/13/2019 CLINICAL DATA:  Shortness of breath, wheezing, and cough beginning yesterday. EXAM: CHEST - 2 VIEW COMPARISON:  09/11/2018 FINDINGS: Cardiomegaly is stable and pacemaker remains in place. Aortic atherosclerosis again noted. Diffuse interstitial infiltrates are again seen, similar in appearance to prior exam. No evidence of pulmonary consolidation. Small bilateral pleural effusions also noted. IMPRESSION: Findings consistent with mild congestive heart failure, with small bilateral pleural effusions. Electronically Signed   By: Marlaine Hind M.D.   On: 06/13/2019 13:18    EKG: Independently reviewed.  Shows rate controlled atrial fibrillation  Assessment/Plan Active Problems:   Thrombocytopenia, unspecified ([place])   Diabetes mellitus type 2 in obese (HCC)   Obesity, Class III, BMI 40-49.9 (morbid obesity) (HCC)   Severe aortic stenosis   Acute systolic CHF (congestive heart failure) (HCC)   Atrial fibrillation, chronic (HCC)    Dyspnea due to suspected acute on chronic systolic CHF/severe aortic stenosis (follows with Dr. Einar Gip) -Weight appears to be up from prior -daily weights and I's and O's -Patient denies any dietary indiscretions and states he has been taking his medications as prescribed -Last echo with ejection fraction of 30% that was felt to be due to aortic stenosis. -He was being evaluated for TAVR but it appears that he was found not to be a candidate due to co-morbidities -Will dose IV Lasix and monitor blood pressure closely as he tends to stay on the lower side of normal -We will hold on reordering echo as  he is most likely not a candidate for any intervention and it is known he has severe aortic stenosis  Chronic atrial fibrillation -Not on blood thinners due to high risk of bleeding  Thrombocytopenia -From liver cirrhosis  Type 2 diabetes on insulin -Sliding scale insulin -We will resume Lantus at a lower dose  HCC Chart review shows that patient and family were planning on speaking with palliative care/hospice as they have decided not to treat his liver cancer any further -Follows at [facility]  Obesity Estimated body mass index is 44.48 kg/m as calculated from the following:   Height as of this encounter: 5' 10"  (1.778 m).   Weight as of this encounter: 140.6 kg.  DVT prophylaxis: We will use SCDs as he has esophageal varices Code Status: DNR Family Communication: No family at bedside Disposition Plan: Pending proper diuresis Consults called: Will attempt to diurese patient with IV Lasix but may require cardiology consult from Dr. Einar Gip if difficulties arise.  Patient also appears to not want any further treatment for his liver cancer so palliative care/hospice evaluation may be appropriate as well    Admission status: Inpatient, telemetry   At the time of admission, it appears that the appropriate admission status for this patient is INPATIENT. This is judged to be reasonable and necessary in order to provide the required high service intensity to ensure the patient's safety given the presenting symptoms, physical exam findings, and initial radiographic and laboratory data in the context of their chronic comorbidities. Current circumstances are multiple medical issues including decompensated heart failure, cirrhosis of the liver and increased work of breathing, and it is felt to place patient at high risk for  further clinical deterioration threatening life, limb, or organ. Moreover, it is my clinical judgment that the patient will require inpatient hospital care spanning beyond 2  midnights from the point of admission and that early discharge would result in unnecessary risk of decompensation and readmission or threat to life, limb or bodily function.   Geradine Girt [facility]   How to contact the [facility] Attending or Consulting provider [place] or covering provider during after hours [place], for this patient?  1. Check the care team in [facility] and look for a) attending/consulting [facility] provider listed and b) the [facility] team listed 2. Log into [email or web address] and use [place]'s universal password to access. If you do not have the password, please contact the hospital operator. 3. Locate the [facility] provider you are looking for under [facility] and page to a number that you can be directly reached. 4. If you still have difficulty reaching the provider, please page the [facility] (Director on Call) for the Hospitalists listed on amion for assistance.  06/13/2019, 3:32 PM

## 2019-06-13 NOTE — ED Triage Notes (Signed)
Pt BIB [facility] for eval of worsening abd swelling and shortness of breath. Wife reports gradual decline in functional status over the last week w/ worsening SOB and some reported wheezing. EMS reports LS are dim in the bases, upper airway w/ slight wheeze. Pt endorses R sided upper abd pain/swelling. Normally ambulatory w/ walker, wife reports harder to do as of late.

## 2019-06-13 NOTE — ED Provider Notes (Signed)
[facility] Provider Note   CSN: [id number] Arrival date & time: 06/13/19  1220     History Chief Complaint  Patient presents with  . Abdominal Swelling  . Shortness of Breath    Keith [name] is a 79 y.o. male.  HPI 79 year old male presents with shortness of breath.  Has been progressive for a couple weeks.  Also progressive leg swelling which he states is new.  No cough, fever, chest pain.  Denies any orthopnea.  Wife endorses that he has been having abdominal swelling as well.   Past Medical History:  Diagnosis Date  . Atrial fibrillation ([place])    per note from Dr. Einar Gip  . Cirrhosis of liver (HCC)    NASH  . Diabetes mellitus   . Encounter for care of pacemaker 11/15/2018  . Heart block AV complete ([place]) 04/15/2018  . Hepatic cancer ([place])   . Pacemaker: Medtronic Advisa [id number] dual chamber pacemaker for complete heart block 06/25/2016  . Prostate hypertrophy   . Shortness of breath   . Stroke ([place])   . Varicose veins   . Weakness of both legs     Patient Active Problem List   Diagnosis Date Noted  . Acute systolic CHF (congestive heart failure) ([place]) 06/13/2019  . Atrial fibrillation, chronic ([place]) 06/13/2019  . Encounter for care of pacemaker 11/15/2018  . Acute metabolic encephalopathy 03/47/4259  . Hallucination 09/11/2018  . Hepatic encephalopathy ([place]) 09/11/2018  . Hallucinations 09/11/2018  . Chronic combined systolic and diastolic CHF, NYHA class 1 ([place]) 05/11/2018  . Severe aortic stenosis 04/15/2018  . Heart block AV complete ([place]) 04/15/2018  . Acute respiratory distress 04/15/2018  . Chest pressure 04/15/2018  . Acute combined systolic and diastolic (congestive) hrt fail ([place]) 04/14/2018  . Obesity, Class III, BMI 40-49.9 (morbid obesity) ([place]) 04/14/2018  . Diabetes mellitus type 2 in obese ([place])   . Orthostatic hypotension   . Syncope 12/17/2017  . Postural dizziness with near syncope 12/17/2017  .  Near syncope 12/17/2017  . Increased ammonia level 11/30/2017  . Hepatic cancer ([place]) 11/30/2017  . Dementia ([place]) 11/30/2017  . BPH (benign prostatic hyperplasia) 11/30/2017  . Coronary artery disease 11/30/2017  . Weakness 11/23/2017  . Cirrhosis of liver ([place]) 11/23/2017  . OSA (obstructive sleep apnea) 11/23/2017  . Pacemaker: Medtronic Advisa [id number] dual chamber pacemaker for complete heart block 06/25/2016  . Varicose veins of left lower extremity with complications 56/38/7564  . Knee pain, acute 01/26/2013  . Neuropathy 12/22/2012  . Prostate hypertrophy 12/22/2012  . Hyperlipidemia 12/22/2012  . GERD (gastroesophageal reflux disease) 12/17/2012  . Hypertension 12/17/2012  . Thrombocytopenia, unspecified ([place]) 12/17/2012  . Hyponatremia 12/15/2012  . OA (osteoarthritis) of knee 12/14/2012    Past Surgical History:  Procedure Laterality Date  . APPENDECTOMY    . EYE SURGERY     bilateral cataract   . INTRAVASCULAR PRESSURE WIRE/FFR STUDY N/A 04/17/2018   Procedure: INTRAVASCULAR PRESSURE WIRE/FFR STUDY;  Surgeon: Adrian Prows, MD;  Location: [facility];  Service: Cardiovascular;  Laterality: N/A;  . left arm surgery     age 85  . left hand surgery     tendons ripped on left hand  . LIVER RESECTION    . RIGHT/LEFT HEART CATH AND CORONARY ANGIOGRAPHY N/A 04/17/2018   Procedure: RIGHT/LEFT HEART CATH AND CORONARY ANGIOGRAPHY;  Surgeon: Adrian Prows, MD;  Location: [facility];  Service: Cardiovascular;  Laterality: N/A;  . TEE WITHOUT CARDIOVERSION  N/A 04/16/2018   Procedure: TRANSESOPHAGEAL ECHOCARDIOGRAM (TEE);  Surgeon: Adrian Prows, MD;  Location: [facility];  Service: Cardiovascular;  Laterality: N/A;  . TONSILLECTOMY    . TOTAL KNEE ARTHROPLASTY Left 12/14/2012   Procedure: LEFT TOTAL KNEE ARTHROPLASTY;  Surgeon: Gearlean Alf, MD;  Location: [facility];  Service: Orthopedics;  Laterality: Left;       Family History  Problem Relation Age of Onset  . Stroke  Father   . Heart failure Brother 26       Identical twin  . Aortic stenosis Mother     Social History   Tobacco Use  . Smoking status: Former Smoker    Packs/day: 3.00    Years: 15.00    Pack years: 45.00    Types: Cigarettes    Quit date: 1970    Years since quitting: 51.2  . Smokeless tobacco: Never Used  Substance Use Topics  . Alcohol use: No    Alcohol/week: 0.0 standard drinks  . Drug use: No    Home Medications Prior to Admission medications   Medication Sig Start Date End Date Taking? Authorizing Provider  albuterol (PROVENTIL HFA;VENTOLIN HFA) 108 (90 BASE) MCG/ACT inhaler Inhale 2 puffs into the lungs every 4 (four) hours as needed for wheezing or shortness of breath.    Yes [provider]  amitriptyline (ELAVIL) 25 MG tablet Take 25 mg by mouth at bedtime.    Yes [provider]  aspirin EC 81 MG EC tablet Take 1 tablet (81 mg total) by mouth daily. 04/19/18  Yes Mariel Aloe, MD  Cholecalciferol (VITAMIN D3) 50 MCG (2000 UT) TABS Take 2,000 Units by mouth daily.   Yes [provider]  cyanocobalamin (,VITAMIN B-12,) 1000 MCG/ML injection Inject 1,000 mcg into the muscle every 30 (thirty) days.   Yes [provider]  finasteride (PROSCAR) 5 MG tablet Take 5 mg by mouth every evening.    Yes [provider]  folic acid (FOLVITE) 1 MG tablet Take 1 mg by mouth daily. 02/13/09  Yes [provider]  furosemide (LASIX) 20 MG tablet Take 20 mg by mouth daily.   Yes [provider]  hydrocerin (EUCERIN) CREA Apply 1 application topically daily as needed (dry skin).    Yes [provider]  insulin aspart (NOVOLOG) 100 UNIT/ML injection Inject 5-30 Units into the skin See admin instructions. Inject 5-30 units into the skin three times a day before meals, per sliding scale   Yes [provider]  insulin glargine (LANTUS) 100 UNIT/ML injection Inject 40 Units into the skin 2 (two) times daily.  Inject 40 units into the skin 2 times a day- 10 AM and bedtime   Yes [provider]  meclizine (ANTIVERT) 25 MG tablet Take 25 mg by mouth 4 (four) times daily as needed for dizziness.  04/19/19  Yes [provider]  memantine (NAMENDA) 10 MG tablet Take 20 mg by mouth at bedtime.   Yes [provider]  pantoprazole (PROTONIX) 40 MG tablet Take 40 mg by mouth daily.   Yes [provider]  polyvinyl alcohol (LIQUIFILM TEARS) 1.4 % ophthalmic solution Place 1 drop into both eyes 2 (two) times daily as needed (dry eyes).   Yes [provider]  pregabalin (LYRICA) 300 MG capsule Take 300 mg by mouth 2 (two) times daily.   Yes [provider]  rifaximin (XIFAXAN) 550 MG TABS tablet Take 550 mg by mouth 2 (two) times daily.   Yes  [provider]  rivastigmine (EXELON) 1.5 MG capsule Take 6 mg by mouth. 1 tablet in the AM, one tablet in the PM   Yes [provider]  simvastatin (ZOCOR) 20 MG tablet Take 10 mg by mouth every evening.   Yes [provider]  spironolactone (ALDACTONE) 25 MG tablet Take 2 tablets (50 mg total) by mouth daily. 06/05/18 06/13/19 Yes Adrian Prows, MD  tamsulosin (FLOMAX) 0.4 MG CAPS Take 0.4 mg by mouth every evening.    Yes [provider]  traMADol (ULTRAM) 50 MG tablet Take 1 tablet (50 mg total) by mouth every 12 (twelve) hours as needed for moderate pain. Patient taking differently: Take 50 mg by mouth every 6 (six) hours as needed (for pain).  12/19/17  Yes Eulogio Bear U, DO    Allergies    Contrast media [iodinated diagnostic agents], Fentanyl, Gadolinium, Iodine, Merbromin, and Other  Review of Systems   Review of Systems  Constitutional: Negative for fever.  Respiratory: Positive for shortness of breath. Negative for cough.   Cardiovascular: Positive for leg swelling. Negative for chest pain.  Gastrointestinal: Positive for abdominal distention. Negative for abdominal pain.  All  other systems reviewed and are negative.   Physical Exam Updated Vital Signs BP 104/78   Pulse 94   Temp 98.6 F (37 C) (Oral)   Resp 18   Ht 5' 10"  (1.778 m)   Wt (!) 140.6 kg   SpO2 96%   BMI 44.48 kg/m   Physical Exam Vitals and nursing note reviewed.  Constitutional:      General: He is not in acute distress.    Appearance: He is well-developed. He is obese.  HENT:     Head: Normocephalic and atraumatic.     Right Ear: External ear normal.     Left Ear: External ear normal.     Nose: Nose normal.  Eyes:     General:        Right eye: No discharge.        Left eye: No discharge.  Cardiovascular:     Rate and Rhythm: Normal rate and regular rhythm.     Heart sounds: Normal heart sounds.  Pulmonary:     Effort: Pulmonary effort is normal.     Breath sounds: Examination of the right-lower field reveals decreased breath sounds and rales. Examination of the left-lower field reveals decreased breath sounds and rales. Decreased breath sounds and rales present.  Abdominal:     Palpations: Abdomen is soft.     Tenderness: There is no abdominal tenderness.  Musculoskeletal:     Cervical back: Neck supple.     Right lower leg: Edema present.     Left lower leg: Edema present.     Comments: Pitting edema to BLE  Skin:    General: Skin is warm and dry.  Neurological:     Mental Status: He is alert.  Psychiatric:        Mood and Affect: Mood is not anxious.     ED Results / Procedures / Treatments   Labs (all labs ordered are listed, but only abnormal results are displayed) Labs Reviewed  CBC - Abnormal; Notable for the following components:      Result Value   WBC 10.9 (*)    RBC 3.60 (*)    Hemoglobin 11.4 (*)    HCT 36.1 (*)    MCV 100.3 (*)    RDW 15.7 (*)    Platelets 57 (*)  All other components within normal limits  COMPREHENSIVE METABOLIC PANEL - Abnormal; Notable for the following components:   Glucose, Bld 159 (*)    Calcium 8.3 (*)    Total  Protein 6.0 (*)    Albumin 2.7 (*)    AST 48 (*)    Total Bilirubin 3.0 (*)    All other components within normal limits  BRAIN NATRIURETIC PEPTIDE - Abnormal; Notable for the following components:   B Natriuretic Peptide 530.9 (*)    All other components within normal limits  CBG MONITORING, ED - Abnormal; Notable for the following components:   Glucose-Capillary 123 (*)    All other components within normal limits  TROPONIN I (HIGH SENSITIVITY) - Abnormal; Notable for the following components:   Troponin I (High Sensitivity) 34 (*)    All other components within normal limits  SARS CORONAVIRUS 2 (TAT 6-24 HRS)  POC SARS CORONAVIRUS 2 AG -  ED  TROPONIN I (HIGH SENSITIVITY)    EKG None  Radiology DG Chest 2 View  Result Date: 06/13/2019 CLINICAL DATA:  Shortness of breath, wheezing, and cough beginning yesterday. EXAM: CHEST - 2 VIEW COMPARISON:  09/11/2018 FINDINGS: Cardiomegaly is stable and pacemaker remains in place. Aortic atherosclerosis again noted. Diffuse interstitial infiltrates are again seen, similar in appearance to prior exam. No evidence of pulmonary consolidation. Small bilateral pleural effusions also noted. IMPRESSION: Findings consistent with mild congestive heart failure, with small bilateral pleural effusions. Electronically Signed   By: Marlaine Hind M.D.   On: 06/13/2019 13:18    Procedures Procedures (including critical care time)  Medications Ordered in ED Medications  traMADol (ULTRAM) tablet 50 mg (has no administration in time range)  aspirin EC tablet 81 mg (has no administration in time range)  rifaximin (XIFAXAN) tablet 550 mg (has no administration in time range)  memantine (NAMENDA) tablet 20 mg (has no administration in time range)  insulin glargine (LANTUS) injection 20 Units (has no administration in time range)  meclizine (ANTIVERT) tablet 25 mg (has no administration in time range)  pantoprazole (PROTONIX) EC tablet 40 mg (has no  administration in time range)  finasteride (PROSCAR) tablet 5 mg (has no administration in time range)  tamsulosin (FLOMAX) capsule 0.4 mg (has no administration in time range)  cyanocobalamin ((VITAMIN B-12)) injection 1,000 mcg (has no administration in time range)  folic acid (FOLVITE) tablet 1 mg (has no administration in time range)  pregabalin (LYRICA) capsule 300 mg (has no administration in time range)  cholecalciferol (VITAMIN D3) tablet 2,000 Units (has no administration in time range)  polyvinyl alcohol (LIQUIFILM TEARS) 1.4 % ophthalmic solution 1 drop (has no administration in time range)  acetaminophen (TYLENOL) tablet 650 mg (has no administration in time range)    Or  acetaminophen (TYLENOL) suppository 650 mg (has no administration in time range)  ondansetron (ZOFRAN) tablet 4 mg (has no administration in time range)    Or  ondansetron (ZOFRAN) injection 4 mg (has no administration in time range)  furosemide (LASIX) injection 20 mg (has no administration in time range)  insulin aspart (novoLOG) injection 0-9 Units (has no administration in time range)  insulin aspart (novoLOG) injection 0-5 Units (has no administration in time range)  furosemide (LASIX) injection 40 mg (40 mg Intravenous Given 06/13/19 1502)    ED Course  I have reviewed the triage vital signs and the nursing notes.  Pertinent labs & imaging results that were available during my care of the patient were reviewed by me  and considered in my medical decision making (see chart for details).    MDM Rules/Calculators/A&P                      Patient has a history of CHF and NASH.  Has some soft blood pressures but this is probably from the cirrhosis.  He otherwise is not in respiratory distress.  Has evidence of CHF and volume overload.  Will give Lasix.  No abdominal tenderness.  Admit to hospitalist service. Final Clinical Impression(s) / ED Diagnoses Final diagnoses:  Acute systolic congestive heart  failure [facility])    Rx / DC Orders ED Discharge Orders    None       Sherwood Gambler, MD 06/13/19 1547

## 2019-06-14 ENCOUNTER — Telehealth: Payer: Self-pay

## 2019-06-14 DIAGNOSIS — I350 Nonrheumatic aortic (valve) stenosis: Secondary | ICD-10-CM

## 2019-06-14 LAB — BASIC METABOLIC PANEL
Anion gap: 6 (ref 5–15)
BUN: 17 mg/dL (ref 8–23)
CO2: 32 mmol/L (ref 22–32)
Calcium: 8.2 mg/dL — ABNORMAL LOW (ref 8.9–10.3)
Chloride: 104 mmol/L (ref 98–111)
Creatinine, Ser: 1.17 mg/dL (ref 0.61–1.24)
GFR calc Af Amer: >60 mL/min (ref >60)
GFR calc non Af Amer: >60 mL/min (ref >60)
Glucose, Bld: 90 mg/dL (ref 70–99)
Potassium: 3.8 mmol/L (ref 3.5–5.1)
Sodium: 142 mmol/L (ref 135–145)

## 2019-06-14 LAB — CBC
HCT: 35 % — ABNORMAL LOW (ref 39.0–52.0)
Hemoglobin: 10.9 g/dL — ABNORMAL LOW (ref 13.0–17.0)
MCH: 31.1 pg (ref 26.0–34.0)
MCHC: 31.1 g/dL (ref 30.0–36.0)
MCV: 100 fL (ref 80.0–100.0)
Platelets: 55 10*3/uL — ABNORMAL LOW (ref 150–400)
RBC: 3.5 MIL/uL — ABNORMAL LOW (ref 4.22–5.81)
RDW: 16 % — ABNORMAL HIGH (ref 11.5–15.5)
WBC: 5.8 10*3/uL (ref 4.0–10.5)
nRBC: 0 % (ref 0.0–0.2)

## 2019-06-14 LAB — GLUCOSE, CAPILLARY
Glucose-Capillary: 81 mg/dL (ref 70–99)
Glucose-Capillary: 105 mg/dL — ABNORMAL HIGH (ref 70–99)
Glucose-Capillary: 165 mg/dL — ABNORMAL HIGH (ref 70–99)
Glucose-Capillary: 145 mg/dL — ABNORMAL HIGH (ref 70–99)

## 2019-06-14 LAB — AMMONIA: Ammonia: 76 umol/L — ABNORMAL HIGH (ref 9–35)

## 2019-06-14 MED ORDER — MIDODRINE HCL 5 MG PO TABS
5.0000 mg | ORAL_TABLET | Freq: Three times a day (TID) | ORAL | Status: DC
  Administered 2019-06-14 – 2019-06-16 (×7): 5 mg via ORAL
  Filled 2019-06-14 (×7): qty 1

## 2019-06-14 MED ORDER — LACTULOSE 10 GM/15ML PO SOLN
20.0000 g | Freq: Two times a day (BID) | ORAL | Status: DC
  Administered 2019-06-15: 22:00:00 20 g via ORAL
  Filled 2019-06-14: qty 30

## 2019-06-14 NOTE — Progress Notes (Addendum)
PROGRESS NOTE    Keith [name]  EUM:[id number] DOB: 1940-12-25 DOA: 06/13/2019 PCP: Reubin Milan, MD  Brief Narrative: Keith [name] is a 79 y.o. male with medical history significant of cirrhosis of the liver with [place] (follows at [facility]), diabetes type 2 on insulin,moderate to severe aortic stenosis with severe LV systolic dysfunction, EF 54%, and obesity. Per wife states that he has been wheezing for 3 to 4 days.  As per prior direction from Dr. Einar Gip she increased his Lasix to 40 mg daily from 20 mg daily for 3 days.  But this did not fix his issues.  He was instructed to come to the ER for further evaluation of heart failure.   Patient follows with Dr. Einar Gip for his cardiac issues.  He was seen on May 21, 2019.  During that visit he stated that he occasionally had episodes of wheezing and also shortness of breath that Dr. Einar Gip thought was related to acute decompensated heart failure.  Unfortunately his blood pressure was on the low side and he has not been able to tolerate beta-blocker or an ACE inhibitor's.  Dr. Einar Gip spoke with the wife about his overall poor long-term prognosis. In early 2020 patient was evaluated for TAVR but CT came back with advanced cirrhosis and questionable peritoneal spread of malignancy.   Assessment & Plan:   Acute on chronic systolic CHF Severe aortic stenosis -Previously evaluated for TAVR and not felt to be a candidate due to numerous comorbidities -Last echo with EF of 35% -Blood pressure is soft which limits aggressive diuresis -Add midodrine, continue IV Lasix 20 mg every 12 today -Monitor I's/O, daily weights and kidney function -Overall prognosis is very poor, wife understands this, agreeable to palliative consult, in fact she had requested a consult to his PCP not too long ago  Liver cirrhosis/NASH Portal hypertension, esophageal varices -Diuretics as above  Hepatic encephalopathy -Ammonia level is elevated, add  lactulose, continue rifaximin  Recurrent hepatocellular carcinoma -Followed at [facility], last seen 6 months ago, further treatment was not pursued -Hospice has been discussed in the past but declined by pt then -will discuss palliative care with spouse  Chronic atrial fibrillation -Not on blood thinners due to high risk of bleeding  Thrombocytopenia -From liver cirrhosis  Type 2 diabetes on insulin -Sliding scale insulin -Continue Lantus at lower dose, CBGs stable monitor  Morbid obesity -BMI 45  DVT prophylaxis: SCDs used as he has esophageal varices Code Status: DNR Family Communication: No family at bedside, called and discussed with wife Keith [name] Disposition Plan: to be determined, prognosis very poor    Procedures:   Antimicrobials:    Subjective: -Feels a little better, denies any chest pain -Complains of dyspnea with minimal activity  Objective: Vitals:   06/13/19 2346 06/13/19 2357 06/14/19 0036 06/14/19 0326  BP: 93/68   (!) 90/58  Pulse: 91   87  Resp: 20   17  Temp: 97.8 F (36.6 C) 97.8 F (36.6 C)  98 F (36.7 C)  TempSrc: Oral Oral  Oral  SpO2: 100% 100%  100%  Weight:   (!) 142.4 kg   Height:        Intake/Output Summary (Last 24 hours) at 06/14/2019 1110 Last data filed at 06/14/2019 0600 Gross per 24 hour  Intake 495 ml  Output 1200 ml  Net -705 ml   Filed Weights   06/13/19 1229 06/13/19 1943 06/14/19 0036  Weight: (!) 140.6 kg (!) 143.7 kg (!) 142.4 kg  Examination:  General exam: Morbidly obese chronically ill male sitting up in bed, AAO x2 Respiratory system: Diminished breath sounds the bases. Cardiovascular system: S1 & S2 heard, RRR systolic ejection murmur  gastrointestinal system: Abdomen is nondistended, soft and nontender.Normal bowel sounds heard. Central nervous system: Awake alert oriented to self and place Extremities: 2+ edema. Skin: No rashes on exposed skin Psychiatry: Poor insight and judgment   Data  Reviewed:   CBC: Recent Labs  Lab 06/13/19 1320 06/14/19 0453  WBC 10.9* 5.8  HGB 11.4* 10.9*  HCT 36.1* 35.0*  MCV 100.3* 100.0  PLT 57* 55*   Basic Metabolic Panel: Recent Labs  Lab 06/13/19 1320 06/14/19 0453  NA 141 142  K 4.5 3.8  CL 103 104  CO2 29 32  GLUCOSE 159* 90  BUN 19 17  CREATININE 1.17 1.17  CALCIUM 8.3* 8.2*   GFR: Estimated Creatinine Clearance: 76.6 mL/min (by C-G formula based on SCr of 1.17 mg/dL). Liver Function Tests: Recent Labs  Lab 06/13/19 1320  AST 48*  ALT 27  ALKPHOS 103  BILITOT 3.0*  PROT 6.0*  ALBUMIN 2.7*   No results for input(s): LIPASE, AMYLASE in the last 168 hours. Recent Labs  Lab 06/14/19 0920  AMMONIA 76*   Coagulation Profile: No results for input(s): INR, PROTIME in the last 168 hours. Cardiac Enzymes: No results for input(s): CKTOTAL, CKMB, CKMBINDEX, TROPONINI in the last 168 hours. BNP (last 3 results) No results for input(s): PROBNP in the last 8760 hours. HbA1C: No results for input(s): HGBA1C in the last 72 hours. CBG: Recent Labs  Lab 06/13/19 1454 06/13/19 1659 06/13/19 2201 06/14/19 0629  GLUCAP 123* 103* 101* 81   Lipid Profile: No results for input(s): CHOL, HDL, LDLCALC, TRIG, CHOLHDL, LDLDIRECT in the last 72 hours. Thyroid Function Tests: No results for input(s): TSH, T4TOTAL, FREET4, T3FREE, THYROIDAB in the last 72 hours. Anemia Panel: No results for input(s): VITAMINB12, FOLATE, FERRITIN, TIBC, IRON, RETICCTPCT in the last 72 hours. Urine analysis:    Component Value Date/Time   COLORURINE STRAW (A) 09/11/2018 1732   APPEARANCEUR CLEAR 09/11/2018 1732   LABSPEC 1.005 09/11/2018 1732   PHURINE 7.0 09/11/2018 1732   GLUCOSEU NEGATIVE 09/11/2018 1732   HGBUR NEGATIVE 09/11/2018 1732   BILIRUBINUR NEGATIVE 09/11/2018 1732   KETONESUR NEGATIVE 09/11/2018 1732   PROTEINUR NEGATIVE 09/11/2018 1732   UROBILINOGEN 0.2 12/03/2012 1338   NITRITE NEGATIVE 09/11/2018 1732   LEUKOCYTESUR  NEGATIVE 09/11/2018 1732   Sepsis Labs: @LABRCNTIP (procalcitonin:4,lacticidven:4)  ) Recent Results (from the past 240 hour(s))  SARS CORONAVIRUS 2 (TAT 6-24 HRS) Nasopharyngeal Nasopharyngeal Swab     Status: None   Collection Time: 06/13/19  2:41 PM   Specimen: Nasopharyngeal Swab  Result Value Ref Range Status   SARS Coronavirus 2 NEGATIVE NEGATIVE Final    Comment: (NOTE) SARS-CoV-2 target nucleic acids are NOT DETECTED. The SARS-CoV-2 RNA is generally detectable in upper and lower respiratory specimens during the acute phase of infection. Negative results do not preclude SARS-CoV-2 infection, do not rule out co-infections with other pathogens, and should not be used as the sole basis for treatment or other patient management decisions. Negative results must be combined with clinical observations, patient history, and epidemiological information. The expected result is Negative. Fact Sheet for Patients: SugarRoll.be Fact Sheet for Healthcare Providers: https://www.woods-mathews.com/ This test is not yet approved or cleared by the [place] FDA and  has been authorized for detection and/or diagnosis of SARS-CoV-2 by FDA under an Emergency  Use Authorization (EUA). This EUA will remain  in effect (meaning this test can be used) for the duration of the COVID-19 declaration under Section 56 4(b)(1) of the Act, 21 U.S.C. section 360bbb-3(b)(1), unless the authorization is terminated or revoked sooner. Performed at [facility], [place] [address], [place], [place] [address]          Radiology Studies: DG Chest 2 View  Result Date: 06/13/2019 CLINICAL DATA:  Shortness of breath, wheezing, and cough beginning yesterday. EXAM: CHEST - 2 VIEW COMPARISON:  09/11/2018 FINDINGS: Cardiomegaly is stable and pacemaker remains in place. Aortic atherosclerosis again noted. Diffuse interstitial infiltrates are again seen, similar in  appearance to prior exam. No evidence of pulmonary consolidation. Small bilateral pleural effusions also noted. IMPRESSION: Findings consistent with mild congestive heart failure, with small bilateral pleural effusions. Electronically Signed   By: Marlaine Hind M.D.   On: 06/13/2019 13:18        Scheduled Meds: . aspirin EC  81 mg Oral Daily  . cholecalciferol  2,000 Units Oral Daily  . cyanocobalamin  1,000 mcg Intramuscular Q30 days  . finasteride  5 mg Oral QPM  . folic acid  1 mg Oral Daily  . furosemide  20 mg Intravenous Q12H  . insulin aspart  0-5 Units Subcutaneous QHS  . insulin aspart  0-9 Units Subcutaneous TID WC  . insulin glargine  20 Units Subcutaneous BID  . lactulose  20 g Oral BID  . memantine  20 mg Oral QHS  . midodrine  5 mg Oral TID WC  . pantoprazole  40 mg Oral Daily  . pregabalin  300 mg Oral BID  . rifaximin  550 mg Oral BID  . tamsulosin  0.4 mg Oral QPM   Continuous Infusions:   LOS: 1 day    Time spent: 62mn   PDomenic Polite MD [facility]  06/14/2019, 11:10 AM

## 2019-06-15 DIAGNOSIS — Z66 Do not resuscitate: Secondary | ICD-10-CM | POA: Diagnosis present

## 2019-06-15 LAB — CBC
HCT: 35 % — ABNORMAL LOW (ref 39.0–52.0)
Hemoglobin: 11.2 g/dL — ABNORMAL LOW (ref 13.0–17.0)
MCH: 31.5 pg (ref 26.0–34.0)
MCHC: 32 g/dL (ref 30.0–36.0)
MCV: 98.3 fL (ref 80.0–100.0)
Platelets: 58 10*3/uL — ABNORMAL LOW (ref 150–400)
RBC: 3.56 MIL/uL — ABNORMAL LOW (ref 4.22–5.81)
RDW: 15.6 % — ABNORMAL HIGH (ref 11.5–15.5)
WBC: 4.6 10*3/uL (ref 4.0–10.5)
nRBC: 0 % (ref 0.0–0.2)

## 2019-06-15 LAB — COMPREHENSIVE METABOLIC PANEL
ALT: 22 U/L (ref 0–44)
AST: 43 U/L — ABNORMAL HIGH (ref 15–41)
Albumin: 2.6 g/dL — ABNORMAL LOW (ref 3.5–5.0)
Alkaline Phosphatase: 92 U/L (ref 38–126)
Anion gap: 12 (ref 5–15)
BUN: 19 mg/dL (ref 8–23)
CO2: 29 mmol/L (ref 22–32)
Calcium: 8.3 mg/dL — ABNORMAL LOW (ref 8.9–10.3)
Chloride: 99 mmol/L (ref 98–111)
Creatinine, Ser: 1.16 mg/dL (ref 0.61–1.24)
GFR calc Af Amer: >60 mL/min (ref >60)
GFR calc non Af Amer: >60 mL/min (ref >60)
Glucose, Bld: 130 mg/dL — ABNORMAL HIGH (ref 70–99)
Potassium: 3.6 mmol/L (ref 3.5–5.1)
Sodium: 140 mmol/L (ref 135–145)
Total Bilirubin: 2.4 mg/dL — ABNORMAL HIGH (ref 0.3–1.2)
Total Protein: 5.9 g/dL — ABNORMAL LOW (ref 6.5–8.1)

## 2019-06-15 LAB — GLUCOSE, CAPILLARY
Glucose-Capillary: 94 mg/dL (ref 70–99)
Glucose-Capillary: 97 mg/dL (ref 70–99)
Glucose-Capillary: 131 mg/dL — ABNORMAL HIGH (ref 70–99)
Glucose-Capillary: 175 mg/dL — ABNORMAL HIGH (ref 70–99)

## 2019-06-15 MED ORDER — PREGABALIN 100 MG PO CAPS
300.0000 mg | ORAL_CAPSULE | Freq: Every day | ORAL | Status: DC
  Administered 2019-06-16: 12:00:00 300 mg via ORAL
  Filled 2019-06-15: qty 3

## 2019-06-15 MED ORDER — OXYCODONE HCL 5 MG PO TABS: 5.0000 mg | ORAL_TABLET | ORAL | Status: DC | PRN

## 2019-06-15 MED ORDER — FUROSEMIDE 10 MG/ML IJ SOLN
40.0000 mg | Freq: Two times a day (BID) | INTRAMUSCULAR | Status: DC
  Administered 2019-06-15 – 2019-06-16 (×2): 40 mg via INTRAVENOUS
  Filled 2019-06-15 (×2): qty 4

## 2019-06-15 NOTE — Progress Notes (Signed)
[place]:  [employer]  The pt chart was reviewed and he has been accepted by our MD as pt for for home with hospice care. Family is in agreement and they will be visiting tonight to see how he is doing and if able to take pt home when MD feels pt is ready for discharge by car or if he will need to go by ambulance. He was walking with a walker prior to this hospitalization.  Wife seems to feel the d/c will be tomorrow and we have a tentative slot for a nurse to follow him in the home around 2-230 pm tomorrow if that is what MD feels is appropriate. Webb Silversmith RN [phone]

## 2019-06-15 NOTE — TOC Initial Note (Signed)
Transition of Care [facility]) - Initial/Assessment Note    Patient Details  Name: Keith [name] MRN: [id number] Date of Birth: 07-05-1940  Transition of Care [facility]) CM/SW Contact:    Alberteen Sam, LCSW Phone Number: 06/15/2019, 10:27 AM  Clinical Narrative:                  CSW consulted for home hospice referral, CSW spoke with patient's wife Edd Fabian regarding discharge planning. She reports preference for [facility] [place].  CSW reached out to [place] with [place] to initiate referral process, she will start on referral and contact wife Edd Fabian to identify any home equipment needs and home services needed to prepare for discharge home with hospice.   Expected Discharge Plan: Home w Hospice Care Barriers to Discharge: Continued Medical Work up   Patient Goals and CMS Choice   CMS Medicare.gov Compare Post Acute Care list provided to:: Patient Represenative (must comment)(spouse Edd Fabian) Choice offered to / list presented to : Spouse  Expected Discharge Plan and Services Expected Discharge Plan: Home w Hospice Care     Post Acute Care Choice: Hospice Living arrangements for the past 2 months: Single Family Home                                      Prior Living Arrangements/Services Living arrangements for the past 2 months: Single Family Home Lives with:: Spouse Patient language and need for interpreter reviewed:: Yes Do you feel safe going back to the place where you live?: Yes      Need for Family Participation in Patient Care: Yes (Comment) Care giver support system in place?: Yes (comment)   Criminal Activity/Legal Involvement Pertinent to Current Situation/Hospitalization: No - Comment as needed  Activities of Daily Living      Permission Sought/Granted Permission sought to share information with : Case Manager, Customer service manager, Family Supports Permission granted to share information with : Yes, Verbal Permission  Granted  Share Information with NAME: Edd Fabian  Permission granted to share info w AGENCY: Hospice Agencies  Permission granted to share info w Relationship: spouse  Permission granted to share info w Contact Information: [phone]  Emotional Assessment       Orientation: : Oriented to Self, Oriented to Place, Oriented to  Time, Oriented to Situation Alcohol / Substance Use: Not Applicable Psych Involvement: No (comment)  Admission diagnosis:  Acute systolic congestive heart failure (HCC) [U44.03] Acute systolic CHF (congestive heart failure) ([place]) [I50.21] Patient Active Problem List   Diagnosis Date Noted  . DNR (do not resuscitate) 06/15/2019  . Acute systolic CHF (congestive heart failure) ([place]) 06/13/2019  . Atrial fibrillation, chronic ([place]) 06/13/2019  . Encounter for care of pacemaker 11/15/2018  . Acute metabolic encephalopathy 47/42/5956  . Hallucination 09/11/2018  . Hepatic encephalopathy ([place]) 09/11/2018  . Hallucinations 09/11/2018  . Chronic combined systolic and diastolic CHF, NYHA class 1 ([place]) 05/11/2018  . Severe aortic stenosis 04/15/2018  . Heart block AV complete ([place]) 04/15/2018  . Acute respiratory distress 04/15/2018  . Chest pressure 04/15/2018  . Acute combined systolic and diastolic (congestive) hrt fail ([place]) 04/14/2018  . Obesity, Class III, BMI 40-49.9 (morbid obesity) ([place]) 04/14/2018  . Diabetes mellitus type 2 in obese ([place])   . Orthostatic hypotension   . Syncope 12/17/2017  . Postural dizziness with near syncope 12/17/2017  . Near syncope 12/17/2017  . Increased ammonia  level 11/30/2017  . Hepatic cancer ([place]) 11/30/2017  . Dementia ([place]) 11/30/2017  . BPH (benign prostatic hyperplasia) 11/30/2017  . Coronary artery disease 11/30/2017  . Weakness 11/23/2017  . Cirrhosis of liver ([place]) 11/23/2017  . OSA (obstructive sleep apnea) 11/23/2017  . Pacemaker: Medtronic Advisa [id number] dual chamber pacemaker for complete heart block 06/25/2016   . Varicose veins of left lower extremity with complications 25/50/0164  . Knee pain, acute 01/26/2013  . Neuropathy 12/22/2012  . Prostate hypertrophy 12/22/2012  . Hyperlipidemia 12/22/2012  . GERD (gastroesophageal reflux disease) 12/17/2012  . Hypertension 12/17/2012  . Thrombocytopenia, unspecified ([place]) 12/17/2012  . Hyponatremia 12/15/2012  . OA (osteoarthritis) of knee 12/14/2012   PCP:  Reubin Milan, MD Pharmacy:   [place], [place] - [address] [phone] [place] [address] Phone: [phone] Fax: [phone]  [id number] [address]Phone: [phone]: [phone]    Social Determinants of Health (SDOH) Interventions    Readmission Risk Interventions No flowsheet data found.

## 2019-06-15 NOTE — Evaluation (Signed)
Occupational Therapy Evaluation Patient Details Name: Keith [name] MRN: [id number] DOB: 1940-09-19 Today's Date: 06/15/2019    History of Present Illness Patient 79 y.o. male with medical history significant of cirrhosis of the liver with [place], diabetes type 2 on insulin,moderate to severe aortic stenosis with severe LV systolic dysfunction, and obesity. Pt diagnosed with dyspnea d/t acute no chronic CHF.    Clinical Impression   Patient is a 79 year old male that lives with his spouse in a single level home with 1 step to enter. Patient is questionable historian therefore unsure how much assist patient required prior to hospitalization. Patient reports his spouse assisted him with self care such as dressing, bathing and limited home ambulation with walker. Patient currently min A from elevated surface and min A for functional ambulation with decreased balance when backing up to edge of bed.   Per chart patient's discharge plan is home with hospice, will discontinue acute OT orders. If possible patient/family would benefit from home health aid for self care if they do not already receive this service.     Follow Up Recommendations  No OT follow up;Supervision/Assistance - 24 hour;Other (comment)(pt D/C with hospice, HH aid if possible/doesn't have)    Equipment Recommendations  3 in 1 bedside commode;Other (comment)(if does not have)       Precautions / Restrictions Precautions Precautions: Fall Restrictions Weight Bearing Restrictions: No      Mobility Bed Mobility Overal bed mobility: Needs Assistance Bed Mobility: Supine to Sit;Sit to Supine     Supine to sit: Min assist;Mod assist Sit to supine: Mod assist   General bed mobility comments: cues for sequencing, min/mod A for trunk support and weight shifting to sit up, mod A to lift LEs onto bed to lay down  Transfers Overall transfer level: Needs assistance Equipment used: Rolling walker (2 wheeled) Transfers: Sit  to/from Stand Sit to Stand: Min assist;From elevated surface         General transfer comment: cues for hand placement and min A to power up     Balance Overall balance assessment: Needs assistance Sitting-balance support: No upper extremity supported;Feet supported Sitting balance-Leahy Scale: Fair Sitting balance - Comments: close supervision to min G for safety   Standing balance support: Bilateral upper extremity supported;During functional activity Standing balance-Leahy Scale: Poor Standing balance comment: reliant on B UE support                           ADL either performed or assessed with clinical judgement   ADL Overall ADL's : Needs assistance/impaired Eating/Feeding: Set up;Bed level Eating/Feeding Details (indicate cue type and reason): drink from cup Grooming: Set up;Sitting;Wash/dry hands;Wash/dry face   Upper Body Bathing: Min guard;Supervision/ safety;Sitting   Lower Body Bathing: Maximal assistance;Sitting/lateral leans   Upper Body Dressing : Min guard;Supervision/safety;Sitting   Lower Body Dressing: Maximal assistance;Total assistance;Sitting/lateral leans;Sit to/from stand   Toilet Transfer: Minimal assistance;Cueing for safety;BSC;Ambulation;RW Toilet Transfer Details (indicate cue type and reason): simulated with functional mobility, decreased balance and safety with backing up to edge of bed with posterior loss of balance  Toileting- Clothing Manipulation and Hygiene: Maximal assistance;Moderate assistance;Sit to/from stand;Sitting/lateral lean       Functional mobility during ADLs: Minimal assistance;Rolling walker;Cueing for safety;Cueing for sequencing General ADL Comments: unsure of patient's baseline regarding self care as patient is questionable historian                  Pertinent  Vitals/Pain Pain Assessment: No/denies pain     Hand Dominance Right   Extremity/Trunk Assessment Upper Extremity Assessment Upper  Extremity Assessment: Overall WFL for tasks assessed   Lower Extremity Assessment Lower Extremity Assessment: Defer to PT evaluation       Communication Communication Communication: Expressive difficulties;Other (comment)(mumbled speech at times, difficult to understand)   Cognition Arousal/Alertness: Awake/alert Behavior During Therapy: WFL for tasks assessed/performed Overall Cognitive Status: No family/caregiver present to determine baseline cognitive functioning Area of Impairment: Orientation;Attention;Memory;Following commands;Safety/judgement;Problem solving                 Orientation Level: Disoriented to;Time(difficulty accurately stating the year first stating 2121) Current Attention Level: Focused Memory: Decreased short-term memory Following Commands: Follows one step commands with increased time Safety/Judgement: Decreased awareness of safety   Problem Solving: Slow processing;Difficulty sequencing;Requires verbal cues;Requires tactile cues General Comments: patient reaching out in front of him for things that are not there, is distractable, pointing to therapists scrub top stating "I thought you had a bottle of wine"              Home Living Family/patient expects to be discharged to:: Private residence Living Arrangements: Spouse/significant other Available Help at Discharge: Family;Available 24 hours/day Type of Home: House Home Access: Stairs to enter [employer] of Steps: 1 Entrance Stairs-Rails: Right;Left;Can reach both Home Layout: One level     Bathroom Shower/Tub: Occupational psychologist: Handicapped height     Home Equipment: Grab bars - toilet;Grab bars - tub/shower;Cane - single point;Other (comment);Walker - 2 wheels   Additional Comments: info obtained from prior eval as patient is a questionable historian      Prior Functioning/Environment Level of Independence: Needs assistance  Gait / Transfers Assistance  Needed: walker for limited household distances ADL's / Homemaking Assistance Needed: patient reports spouse assists him with ADLs, when asked if they have caregiver/aid assistance patient states yes but cannot remember their names            OT Problem List: Decreased activity tolerance;Impaired balance (sitting and/or standing);Decreased cognition;Decreased safety awareness       AM-PAC OT "6 Clicks" Daily Activity     Outcome Measure Help from another person eating meals?: A Little Help from another person taking care of personal grooming?: A Little Help from another person toileting, which includes using toliet, bedpan, or urinal?: A Lot Help from another person bathing (including washing, rinsing, drying)?: A Lot Help from another person to put on and taking off regular upper body clothing?: A Little Help from another person to put on and taking off regular lower body clothing?: A Lot 6 Click Score: 15   End of Session Equipment Utilized During Treatment: Rolling walker  Activity Tolerance: Patient tolerated treatment well Patient left: in bed;with call bell/phone within reach;with bed alarm set  OT Visit Diagnosis: Other abnormalities of gait and mobility (R26.89);Other symptoms and signs involving cognitive function                Time: 5697-9480 OT Time Calculation (min): 24 min Charges:  OT General Charges $OT Visit: 1 Visit OT Evaluation $OT Eval Moderate Complexity: 1 Mod OT Treatments $Self Care/Home Management : 8-22 mins  Shon Millet OT OT office: [place] 06/15/2019, 10:54 AM

## 2019-06-15 NOTE — Plan of Care (Signed)
  Problem: Activity: Goal: Capacity to carry out activities will improve Outcome: Progressing   Problem: Cardiac: Goal: Ability to achieve and maintain adequate cardiopulmonary perfusion will improve Outcome: Progressing   

## 2019-06-15 NOTE — Evaluation (Signed)
Physical Therapy Evaluation Patient Details Name: Keith [name] MRN: [id number] DOB: 1940/05/03 Today's Date: 06/15/2019   History of Present Illness  Patient 79 y.o. male with medical history significant of cirrhosis of the liver with [place], diabetes type 2 on insulin,moderate to severe aortic stenosis with severe LV systolic dysfunction, and obesity. Pt diagnosed with dyspnea d/t acute no chronic CHF.   Clinical Impression  Pt in bed upon arrival of PT, agreeable to evaluation at this time. The pt presents with limitations in functional mobility, strength, and balance due to above dx, and course is further complicated by poor cognition that limited the pt's ability to engage in session and follow commands for mobility. Given the pt's presentation, I would typically recommend continued inpatient care for supervision and to provide assist with mobility, but the family has expressed interest in d/c home with hospice care. Therefore, I recommend WC and hospital bed for improved safety and easing the burden of caregivers at home, and PT will sign off. Please feel free to re-consult if there is a change in status or pt/family desires.      Follow Up Recommendations No PT follow up;Supervision/Assistance - 24 hour(pt going home with home hospice, will benefit from continued supervision)    Equipment Recommendations  Wheelchair (measurements PT);Wheelchair cushion (measurements PT);Hospital bed    Recommendations for Other Services       Precautions / Restrictions Precautions Precautions: Fall Restrictions Weight Bearing Restrictions: No      Mobility  Bed Mobility Overal bed mobility: Needs Assistance Bed Mobility: Rolling Rolling: Max assist;+2 for physical assistance     General bed mobility comments: pt unable to complete roll in bed even with max cues and physical assist. other transfers deferred at this time for pt safety. the pt was able to reposition in bed using BLE and hands  on bed rails     Balance Overall balance assessment: (unable to assess as it was unsafe to get pt to sitting EOB in current cog state) Sitting-balance support: No upper extremity supported;Feet supported Sitting balance-Leahy Scale: Fair Sitting balance - Comments: close supervision to min G for safety   Standing balance support: Bilateral upper extremity supported;During functional activity Standing balance-Leahy Scale: Poor Standing balance comment: reliant on B UE support                             Pertinent Vitals/Pain Pain Assessment: No/denies pain    Home Living Family/patient expects to be discharged to:: Private residence Living Arrangements: Spouse/significant other Available Help at Discharge: Family;Available 24 hours/day Type of Home: House Home Access: Stairs to enter Entrance Stairs-Rails: Right;Left;Can reach both Entrance Stairs-Number of Steps: 1 Home Layout: One level Home Equipment: Grab bars - toilet;Grab bars - tub/shower;Cane - single point;Other (comment);Walker - 2 wheels Additional Comments: info obtained from prior eval as patient is a questionable historian    Prior Function Level of Independence: Needs assistance   Gait / Transfers Assistance Needed: walker for limited household distances  ADL's / Homemaking Assistance Needed: patient reports spouse assists him with ADLs, when asked if they have caregiver/aid assistance patient states yes but cannot remember their names  Comments: Uses RW for ambulation. Reports indep with all ADLs. Wife drives     Hand Dominance   Dominant Hand: Right    Extremity/Trunk Assessment   Upper Extremity Assessment Upper Extremity Assessment: Generalized weakness    Lower Extremity Assessment Lower Extremity Assessment: Generalized weakness  Cervical / Trunk Assessment Cervical / Trunk Assessment: Normal  Communication   Communication: Expressive difficulties;Other (comment)(mumbled speech,  difficult to understand)  Cognition Arousal/Alertness: Awake/alert Behavior During Therapy: WFL for tasks assessed/performed Overall Cognitive Status: No family/caregiver present to determine baseline cognitive functioning Area of Impairment: Orientation;Attention;Memory;Following commands;Safety/judgement;Problem solving                 Orientation Level: Disoriented to;Place;Time;Situation(only aware that we are in [place], seemed surprised to learn we are in hospital) Current Attention Level: Focused Memory: Decreased short-term memory Following Commands: Follows one step commands inconsistently Safety/Judgement: Decreased awareness of safety;Decreased awareness of deficits   Problem Solving: Slow processing;Difficulty sequencing;Requires verbal cues;Requires tactile cues General Comments: patient reaching out in front of him for things that are not there, is distractable, stating "we are going to hang the American Flag" while imitating puting small flag on his finger. The pt was only able to follow some simple commands with tactile and visual cues, but did not complete any commands with gross movements such as "sit on the edge of the bed" Pt also appeared to be talking to people who were not in the room. possibly hallucinating as he asked about multiple objects that were not in the room. Pt seemed unaware of how to feed himself when lunch tray was placed infront of him and spoon was placed in his hand.      General Comments General comments (skin integrity, edema, etc.): pt with sig worse cognition than in OT evaluation this AM, RN and NT were informed of need to supervise pt feeding to ensure safety as pt seemed unaware of how to eat when lunch tray was placed in front of him.    Exercises     Assessment/Plan    PT Assessment All further PT needs can be met in the next venue of care(pt d/c home with hospice and family care)  PT Problem List Decreased strength;Decreased  mobility;Decreased safety awareness;Decreased range of motion;Decreased coordination;Obesity;Decreased knowledge of precautions;Decreased activity tolerance;Decreased cognition;Decreased balance       PT Treatment Interventions Functional mobility training;Therapeutic activities;Cognitive remediation;Patient/family education    PT Goals (Current goals can be found in the Care Plan section)  Acute Rehab PT Goals Patient Stated Goal: pt unable to participate in goal setting, but agreeable to going home with family PT Goal Formulation: With patient Time For Goal Achievement: 06/29/19 Potential to Achieve Goals: Fair    Frequency     Barriers to discharge        Co-evaluation               AM-PAC PT "6 Clicks" Mobility  Outcome Measure Help needed turning from your back to your side while in a flat bed without using bedrails?: A Lot Help needed moving from lying on your back to sitting on the side of a flat bed without using bedrails?: A Lot Help needed moving to and from a bed to a chair (including a wheelchair)?: Total Help needed standing up from a chair using your arms (e.g., wheelchair or bedside chair)?: Total Help needed to walk in hospital room?: Total Help needed climbing 3-5 steps with a railing? : Total 6 Click Score: 8    End of Session   Activity Tolerance: Other (comment)(limited by cognition and poor command following) Patient left: in bed;with bed alarm set Nurse Communication: Mobility status(need for assist feeding) PT Visit Diagnosis: Muscle weakness (generalized) (M62.81);Difficulty in walking, not elsewhere classified (R26.2)    Time: 1211-1227 PT Time  Calculation (min) (ACUTE ONLY): 16 min   Charges:   PT Evaluation $PT Eval Moderate Complexity: 1 Mod          Karma Ganja, PT, DPT   Acute Rehabilitation Department Pager #: [phone]  Otho Bellows 06/15/2019, 1:54 PM

## 2019-06-15 NOTE — Consult Note (Signed)
Mr. Keith [name] is a 79 yo man with advanced liver cancer, CAD and severe AS who is no longer seeking aggressive interventions or treatment. I spoke with his wife at length about his goals of care which are QOL and to be at home. His functional status has declined dramatically over the past 6 months and he has been increasingly difficult to manage at home. His wife is a retired Psychologist, educational, she needs support and has largely been managing his care alone.I discussed hospice care at length and his prognosis. Keith [name] s confused this AM and not able to fully participate in conversation that involves complexities, but he was able to endorse the concept of hospice.  Recommendations:  1. DNR 2. Discharge home with in home hospice services 3. Aggressively diurese for his comfort, regardless of renal function 4. Manage pain and SOB-oxycodone or duragesic in liver dx preferred. 5. No need for additional diagnostics or  telemetry monitoring while we work on discharge planning and making sure things are in place at home and his medications regimen is focused on comfort.  Time: 70 minutes  Greater than 50%  of this time was spent counseling and coordinating care related to the above assessment and plan.   Lane Hacker, DO Palliative Medicine

## 2019-06-15 NOTE — Progress Notes (Signed)
PROGRESS NOTE    [name]  NOM:[id number] DOB: [birth year] DOA: 06/13/2019 PCP: Reubin Milan, MD  Brief Narrative: Keith [name] is a 79 y.o. male with medical history significant of cirrhosis of the liver with [place] (follows at [facility]), diabetes type 2 on insulin,moderate to severe aortic stenosis with severe LV systolic dysfunction, EF 96%, and obesity. Per wife states that he has been wheezing for 3 to 4 days.  As per prior direction from Dr. Einar Gip she increased his Lasix to 40 mg daily from 20 mg daily for 3 days.  Patient follows with Dr. Einar Gip for his cardiac issues.  He was seen on May 21, 2019.  During that visit he stated that he occasionally had episodes of wheezing and also shortness of breath that Dr. Einar Gip thought was related to acute decompensated heart failure.  Unfortunately his blood pressure was on the low side and he has not been able to tolerate beta-blocker or an ACE inhibitor's.  Dr. Einar Gip spoke with the wife about his overall poor long-term prognosis. In early 2020 patient was evaluated for TAVR but CT came back with advanced cirrhosis and questionable peritoneal spread of malignancy.   Assessment & Plan:   Acute on chronic systolic CHF Severe aortic stenosis -Previously evaluated for TAVR and not felt to be a candidate due to numerous comorbidities -Last echo with EF of 35% He was started on midodrine yesterday due to hypotension limiting diuresis, blood pressure more stable today -Continue IV Lasix, increase dose to 40 mg twice daily -He is 2.2 L negative so far -Overall prognosis is poor -Palliative consulted for goals of care  Liver cirrhosis/NASH Portal hypertension, esophageal varices -Diuretics as above  Hepatic encephalopathy -Ammonia level 72, started on lactulose yesterday, continue rifaximin -Remains confused this morning  Recurrent hepatocellular carcinoma -Followed at [facility], last seen 6 months ago, further treatment was  not pursued -Hospice has been discussed in the past but declined by pt then -palliative consult appreciated, plan for home with hospice tomorrow  Chronic atrial fibrillation -Not on blood thinners due to high risk of bleeding  Thrombocytopenia -From liver cirrhosis  Type 2 diabetes on insulin -Sliding scale insulin -Continue Lantus at lower dose, CBGs stable monitor  Morbid obesity -BMI 45  DVT prophylaxis: SCDs used as he has esophageal varices Code Status: DNR Family Communication:  No family at bedside, called and discussed with wife Baker Janus Disposition Plan:  Likely home with hospice services tomorrow    Procedures:   Antimicrobials:    Subjective: -Reports that his breathing is a little better, seems slightly more confused this morning -No other events overnight  Objective: Vitals:   06/14/19 2012 06/15/19 0517 06/15/19 0600 06/15/19 0922  BP: 108/70 107/75  103/71  Pulse: 87 85    Resp: 18 18    Temp: 97.9 F (36.6 C) 98.2 F (36.8 C)    TempSrc: Oral Oral    SpO2: 100% 95%    Weight:   (!) 140.1 kg   Height:        Intake/Output Summary (Last 24 hours) at 06/15/2019 1131 Last data filed at 06/15/2019 0900 Gross per 24 hour  Intake 360 ml  Output 3000 ml  Net -2640 ml   Filed Weights   06/13/19 1943 06/14/19 0036 06/15/19 0600  Weight: (!) 143.7 kg (!) 142.4 kg (!) 140.1 kg    Examination:  Gen: Morbidly obese chronically ill male sitting up in bed somnolent, rise arousable, oriented to self and partly to  place HEENT: PERRLA, Neck supple, no JVD Lungs: Decreased breath sounds the bases CVS: S1-S2, regular rate rhythm, systolic ejection murmur Abd: soft, Non tender, non distended, BS present Extremities: 1-2+ edema Skin: no new rashes Psychiatry: Poor insight and judgment   Data Reviewed:   CBC: Recent Labs  Lab 06/13/19 1320 06/14/19 0453 06/15/19 0458  WBC 10.9* 5.8 4.6  HGB 11.4* 10.9* 11.2*  HCT 36.1* 35.0* 35.0*  MCV 100.3*  100.0 98.3  PLT 57* 55* 58*   Basic Metabolic Panel: Recent Labs  Lab 06/13/19 1320 06/14/19 0453 06/15/19 0458  NA 141 142 140  K 4.5 3.8 3.6  CL 103 104 99  CO2 29 32 29  GLUCOSE 159* 90 130*  BUN 19 17 19   CREATININE 1.17 1.17 1.16  CALCIUM 8.3* 8.2* 8.3*   GFR: Estimated Creatinine Clearance: 76.5 mL/min (by C-G formula based on SCr of 1.16 mg/dL). Liver Function Tests: Recent Labs  Lab 06/13/19 1320 06/15/19 0458  AST 48* 43*  ALT 27 22  ALKPHOS 103 92  BILITOT 3.0* 2.4*  PROT 6.0* 5.9*  ALBUMIN 2.7* 2.6*   No results for input(s): LIPASE, AMYLASE in the last 168 hours. Recent Labs  Lab 06/14/19 0920  AMMONIA 76*   Coagulation Profile: No results for input(s): INR, PROTIME in the last 168 hours. Cardiac Enzymes: No results for input(s): CKTOTAL, CKMB, CKMBINDEX, TROPONINI in the last 168 hours. BNP (last 3 results) No results for input(s): PROBNP in the last 8760 hours. HbA1C: No results for input(s): HGBA1C in the last 72 hours. CBG: Recent Labs  Lab 06/14/19 1300 06/14/19 1627 06/14/19 2041 06/15/19 0652 06/15/19 1115  GLUCAP 105* 165* 145* 94 97   Lipid Profile: No results for input(s): CHOL, HDL, LDLCALC, TRIG, CHOLHDL, LDLDIRECT in the last 72 hours. Thyroid Function Tests: No results for input(s): TSH, T4TOTAL, FREET4, T3FREE, THYROIDAB in the last 72 hours. Anemia Panel: No results for input(s): VITAMINB12, FOLATE, FERRITIN, TIBC, IRON, RETICCTPCT in the last 72 hours. Urine analysis:    Component Value Date/Time   COLORURINE STRAW (A) 09/11/2018 1732   APPEARANCEUR CLEAR 09/11/2018 1732   LABSPEC 1.005 09/11/2018 1732   PHURINE 7.0 09/11/2018 1732   GLUCOSEU NEGATIVE 09/11/2018 1732   HGBUR NEGATIVE 09/11/2018 1732   BILIRUBINUR NEGATIVE 09/11/2018 1732   KETONESUR NEGATIVE 09/11/2018 1732   PROTEINUR NEGATIVE 09/11/2018 1732   UROBILINOGEN 0.2 12/03/2012 1338   NITRITE NEGATIVE 09/11/2018 1732   LEUKOCYTESUR NEGATIVE 09/11/2018  1732   Sepsis Labs: @LABRCNTIP (procalcitonin:4,lacticidven:4)  ) Recent Results (from the past 240 hour(s))  SARS CORONAVIRUS 2 (TAT 6-24 HRS) Nasopharyngeal Nasopharyngeal Swab     Status: None   Collection Time: 06/13/19  2:41 PM   Specimen: Nasopharyngeal Swab  Result Value Ref Range Status   SARS Coronavirus 2 NEGATIVE NEGATIVE Final    Comment: (NOTE) SARS-CoV-2 target nucleic acids are NOT DETECTED. The SARS-CoV-2 RNA is generally detectable in upper and lower respiratory specimens during the acute phase of infection. Negative results do not preclude SARS-CoV-2 infection, do not rule out co-infections with other pathogens, and should not be used as the sole basis for treatment or other patient management decisions. Negative results must be combined with clinical observations, patient history, and epidemiological information. The expected result is Negative. Fact Sheet for Patients: SugarRoll.be Fact Sheet for Healthcare Providers: https://www.woods-mathews.com/ This test is not yet approved or cleared by the [place] FDA and  has been authorized for detection and/or diagnosis of SARS-CoV-2 by FDA under an  Emergency Use Authorization (EUA). This EUA will remain  in effect (meaning this test can be used) for the duration of the COVID-19 declaration under Section 56 4(b)(1) of the Act, 21 U.S.C. section 360bbb-3(b)(1), unless the authorization is terminated or revoked sooner. Performed at [facility], [place] [address] [place], [place] [address]          Radiology Studies: DG Chest 2 View  Result Date: 06/13/2019 CLINICAL DATA:  Shortness of breath, wheezing, and cough beginning yesterday. EXAM: CHEST - 2 VIEW COMPARISON:  09/11/2018 FINDINGS: Cardiomegaly is stable and pacemaker remains in place. Aortic atherosclerosis again noted. Diffuse interstitial infiltrates are again seen, similar in appearance to prior exam. No  evidence of pulmonary consolidation. Small bilateral pleural effusions also noted. IMPRESSION: Findings consistent with mild congestive heart failure, with small bilateral pleural effusions. Electronically Signed   By: Marlaine Hind M.D.   On: 06/13/2019 13:18        Scheduled Meds: . aspirin EC  81 mg Oral Daily  . finasteride  5 mg Oral QPM  . furosemide  20 mg Intravenous Q12H  . insulin aspart  0-5 Units Subcutaneous QHS  . insulin aspart  0-9 Units Subcutaneous TID WC  . insulin glargine  20 Units Subcutaneous BID  . lactulose  20 g Oral BID  . midodrine  5 mg Oral TID WC  . pantoprazole  40 mg Oral Daily  . [START ON 06/16/2019] pregabalin  300 mg Oral Daily  . rifaximin  550 mg Oral BID  . tamsulosin  0.4 mg Oral QPM   Continuous Infusions:   LOS: 2 days    Time spent: 32mn   PDomenic Polite MD [facility]  06/15/2019, 11:31 AM

## 2019-06-16 ENCOUNTER — Ambulatory Visit: Payer: Non-veteran care

## 2019-06-16 LAB — GLUCOSE, CAPILLARY
Glucose-Capillary: 70 mg/dL (ref 70–99)
Glucose-Capillary: 100 mg/dL — ABNORMAL HIGH (ref 70–99)
Glucose-Capillary: 106 mg/dL — ABNORMAL HIGH (ref 70–99)

## 2019-06-16 LAB — BASIC METABOLIC PANEL
Anion gap: 9 (ref 5–15)
BUN: 16 mg/dL (ref 8–23)
CO2: 33 mmol/L — ABNORMAL HIGH (ref 22–32)
Calcium: 8.3 mg/dL — ABNORMAL LOW (ref 8.9–10.3)
Chloride: 101 mmol/L (ref 98–111)
Creatinine, Ser: 1.2 mg/dL (ref 0.61–1.24)
GFR calc Af Amer: >60 mL/min (ref >60)
GFR calc non Af Amer: 58 mL/min — ABNORMAL LOW (ref >60)
Glucose, Bld: 97 mg/dL (ref 70–99)
Potassium: 3.3 mmol/L — ABNORMAL LOW (ref 3.5–5.1)
Sodium: 143 mmol/L (ref 135–145)

## 2019-06-16 MED ORDER — LACTULOSE 10 GM/15ML PO SOLN: 20.0000 g | Freq: Two times a day (BID) | ORAL | 0 refills | Status: AC

## 2019-06-16 MED ORDER — FUROSEMIDE 40 MG PO TABS: 40.0000 mg | ORAL_TABLET | Freq: Every day | ORAL | 0 refills | Status: AC

## 2019-06-16 MED ORDER — OXYCODONE HCL 5 MG PO TABS: 5.0000 mg | ORAL_TABLET | ORAL | 0 refills | Status: AC | PRN

## 2019-06-16 MED ORDER — PREGABALIN 300 MG PO CAPS: 300.0000 mg | ORAL_CAPSULE | Freq: Every day | ORAL | Status: AC

## 2019-06-16 MED ORDER — DEXTROSE 50 % IV SOLN
INTRAVENOUS | Status: AC
  Filled 2019-06-16: qty 50

## 2019-06-16 MED ORDER — TRAMADOL HCL 50 MG PO TABS: 50.0000 mg | ORAL_TABLET | Freq: Four times a day (QID) | ORAL | 0 refills | Status: AC | PRN

## 2019-06-16 MED ORDER — MIDODRINE HCL 5 MG PO TABS: 5.0000 mg | ORAL_TABLET | Freq: Three times a day (TID) | ORAL | 0 refills | Status: AC

## 2019-06-16 MED FILL — MIDODRINE HCL 5 MG TABLET: 5 | 30 days supply | Qty: 90 | Fill #0

## 2019-06-16 MED FILL — oxyCODONE HCL 5 MG TABS: 5 | 3 days supply | Qty: 30 | Fill #0

## 2019-06-16 MED FILL — FUROSEMIDE 40 MG TABLET: 40 | 30 days supply | Qty: 30 | Fill #0

## 2019-06-16 MED FILL — HALOPERIDOL 1 MG TABS: 1 | 3 days supply | Qty: 15 | Fill #0

## 2019-06-16 NOTE — TOC Transition Note (Signed)
Transition of Care [facility]) - CM/SW Discharge Note   Patient Details  Name: Keith [name] MRN: [id number] Date of Birth: 1941-04-01  [facility] of [facility]) CM/SW Contact:  Alberteen Sam, LCSW Phone Number: 06/16/2019, 10:04 AM   Clinical Narrative:     Patient will DC to: Home with [facility] services Anticipated DC date:06/16/19 Family notified:Keith [name]  Transport OT:[facility]  Per MD patient ready for DC to home with [facility] [place] . RN, patient, patient's family, and [facility] notified of DC. Ambulance transport requested for patient for 12:00 pm.  CSW signing off.  [place], [place]    Final next level of care: Home w Hospice Care Barriers to Discharge: No Barriers Identified   Patient Goals and CMS Choice   CMS Medicare.gov Compare Post Acute Care list provided to:: Patient Represenative (must comment)(spouse Keith [name]) Choice offered to / list presented to : Spouse  Discharge Placement                Patient to be transferred to facility by: [place] Name of family member notified: Keith [name] Patient and family notified of of transfer: 06/16/19  Discharge Plan and Services     Post Acute Care Choice: Hospice                               Social Determinants of Health (SDOH) Interventions     Readmission Risk Interventions No flowsheet data found.

## 2019-06-16 NOTE — Progress Notes (Signed)
[facility]: [employer]  Have spoke to the pt's wife. She feels that due to increased confusion and continued weakness of pt that he will need to travel by ambulance if plan is for discharge today.   Continues to refuse any equipment needs in home. She does have wheelchair already in place.   If pt is discharging today Hospice has tentative plan to see them around 200p-230pm.   Thank you for this referral and if you have questions please call at [place]

## 2019-06-16 NOTE — Progress Notes (Addendum)
44 PTAR given home meds delivered from [place], AVS provided. Patient left with PTAR at 1300.

## 2019-06-16 NOTE — Discharge Summary (Signed)
Physician Discharge Summary  Keith [name] LKJ:[id number] DOB: Aug 28, 1940 DOA: 06/13/2019  PCP: Reubin Milan, MD  Admit date: 06/13/2019 Discharge date: 06/16/2019  Admitted From: Home Disposition:  Home with home hospice   Discharge Condition: [place] CODE STATUS: DNR  Diet recommendation: Regular diet for comfort   Brief/Interim Summary: Keith Coffin Gambacciniis a 79 y.o.malewith medical history significant ofcirrhosis of the liver with [place] (follows at [facility]), diabetes type 2 on insulin,moderate to severe aortic stenosis with severe LV systolic dysfunction, EF 79%, andobesity. Per wife states that he has been wheezing for 3 to 4 days. As per prior direction from Dr. Einar Gip, she increased his Lasix to 40 mg daily from 20 mg daily for 3 days. Patient follows with Dr. Einar Gip for his cardiac issues. He was seen on May 21, 2019. During that visit, he stated that he occasionally had episodes of wheezing and also shortness of breath that Dr. Einar Gip thought was related to acute decompensated heart failure. Unfortunately his blood pressure was on the low side and he has not been able to tolerate beta-blocker or an ACE inhibitors. Dr. Einar Gip spoke with the wife about his overall poor long-term prognosis. In early 2020 patient was evaluated for TAVR but CT came back with advanced cirrhosis and questionable peritoneal spread of malignancy. During his hospitalization, palliative care team was involved and due to patient's decompensation, patient and wife elected to enroll and discharge with home hospice.   Discharge Diagnoses:  Principal Problem:   Acute systolic CHF (congestive heart failure) (HCC) Active Problems:   Thrombocytopenia, unspecified ([place])   Prostate hypertrophy   Hyperlipidemia   Cirrhosis of liver (HCC)   Hepatic cancer ([place])   Diabetes mellitus type 2 in obese (HCC)   Obesity, Class III, BMI 40-49.9 (morbid obesity) ([place])   Severe aortic stenosis   Hepatic  encephalopathy (HCC)   Atrial fibrillation, chronic (HCC)   DNR (do not resuscitate)   Acute on chronic systolic CHF Severe aortic stenosis -Previously evaluated for TAVR and not felt to be a candidate due to numerous comorbidities -Last echo with EF of 35% -Continue lasix irrespective of serum creatinine to keep him comfortable  Liver cirrhosis/NASH Portal hypertension, esophageal varices -Diuretics as above  Hepatic encephalopathy -Ammonia level 72, continue lactulose, rifaximin  Recurrent hepatocellular carcinoma -Followed at [facility] ([place] last seen 6 months ago, further treatment was not pursued -Hospice has been discussed in the past but declined by pt then -Palliative consult appreciated, plan for home with hospice  Chronic atrial fibrillation -Not on blood thinners due to high risk of bleeding  Thrombocytopenia -From liver cirrhosis  Type 2 diabetes on insulin -Stop insulin   Morbid obesity -Estimated body mass index is 43.81 kg/m as calculated from the following:   Height as of this encounter: 5' 10"  (1.778 m).   Weight as of this encounter: 138.5 kg.    Discharge Instructions   Allergies as of 06/16/2019      Reactions   Contrast Media [iodinated Diagnostic Agents] Rash   Cannot have contrast   Fentanyl Itching, Rash   Gadolinium Rash   Iodine Rash   Merbromin Rash   Other Rash   "Opioids"      Medication List    STOP taking these medications   amitriptyline 25 MG tablet Commonly known as: ELAVIL   aspirin 81 MG EC tablet   cyanocobalamin 1000 MCG/ML injection Commonly known as: (VITAMIN Y-80)   folic acid 1 MG tablet Commonly known as: FOLVITE   insulin  aspart 100 UNIT/ML injection Commonly known as: novoLOG   insulin glargine 100 UNIT/ML injection Commonly known as: LANTUS   meclizine 25 MG tablet Commonly known as: ANTIVERT   memantine 10 MG tablet Commonly known as: NAMENDA   pantoprazole 40 MG tablet Commonly known as:  PROTONIX   rivastigmine 1.5 MG capsule Commonly known as: EXELON   simvastatin 20 MG tablet Commonly known as: ZOCOR   spironolactone 25 MG tablet Commonly known as: ALDACTONE   Vitamin D3 50 MCG (2000 UT) Tabs     TAKE these medications   albuterol 108 (90 Base) MCG/ACT inhaler Commonly known as: VENTOLIN HFA Inhale 2 puffs into the lungs every 4 (four) hours as needed for wheezing or shortness of breath.   finasteride 5 MG tablet Commonly known as: PROSCAR Take 5 mg by mouth every evening.   furosemide 40 MG tablet Commonly known as: LASIX Take 1 tablet (40 mg total) by mouth daily. What changed:   medication strength  how much to take   hydrocerin Crea Apply 1 application topically daily as needed (dry skin).   lactulose 10 GM/15ML solution Commonly known as: CHRONULAC Take 30 mLs (20 g total) by mouth 2 (two) times daily.   midodrine 5 MG tablet Commonly known as: PROAMATINE Take 1 tablet (5 mg total) by mouth 3 (three) times daily with meals.   oxyCODONE 5 MG immediate release tablet Commonly known as: Oxy IR/ROXICODONE Take 1-2 tablets (5-10 mg total) by mouth every 4 (four) hours as needed for moderate pain (Discomfort, Dyspnea).   polyvinyl alcohol 1.4 % ophthalmic solution Commonly known as: LIQUIFILM TEARS Place 1 drop into both eyes 2 (two) times daily as needed (dry eyes).   pregabalin 300 MG capsule Commonly known as: LYRICA Take 1 capsule (300 mg total) by mouth daily. What changed: when to take this   rifaximin 550 MG Tabs tablet Commonly known as: XIFAXAN Take 550 mg by mouth 2 (two) times daily.   tamsulosin 0.4 MG Caps capsule Commonly known as: FLOMAX Take 0.4 mg by mouth every evening.   traMADol 50 MG tablet Commonly known as: ULTRAM Take 1 tablet (50 mg total) by mouth every 6 (six) hours as needed (for pain).       Allergies  Allergen Reactions  . Contrast Media [Iodinated Diagnostic Agents] Rash    Cannot have contrast   . Fentanyl Itching and Rash  . Gadolinium Rash  . Iodine Rash  . Merbromin Rash  . Other Rash    "Opioids"    Consultations:  Cardiology  Palliative care   Procedures/Studies: DG Chest 2 View  Result Date: 06/13/2019 CLINICAL DATA:  Shortness of breath, wheezing, and cough beginning yesterday. EXAM: CHEST - 2 VIEW COMPARISON:  09/11/2018 FINDINGS: Cardiomegaly is stable and pacemaker remains in place. Aortic atherosclerosis again noted. Diffuse interstitial infiltrates are again seen, similar in appearance to prior exam. No evidence of pulmonary consolidation. Small bilateral pleural effusions also noted. IMPRESSION: Findings consistent with mild congestive heart failure, with small bilateral pleural effusions. Electronically Signed   By: Marlaine Hind M.D.   On: 06/13/2019 13:18       Discharge Exam: Vitals:   06/15/19 1952 06/16/19 0448  BP: 114/76 101/71  Pulse: 88 83  Resp: 20 19  Temp: 98.5 F (36.9 C) 97.8 F (36.6 C)  SpO2: 93% 94%     General: Pt is somnolent, does not arouse with verbal stimuli but appears to be very comfortable  Cardiovascular: RRR, S1/S2 +, +  edema Respiratory: CTA bilaterally, no wheezing, no rhonchi, no respiratory distress Abdominal: Soft, NT, ND, bowel sounds + Extremities: +edema, no cyanosis    The results of significant diagnostics from this hospitalization (including imaging, microbiology, ancillary and laboratory) are listed below for reference.     Microbiology: Recent Results (from the past 240 hour(s))  SARS CORONAVIRUS 2 (TAT 6-24 HRS) Nasopharyngeal Nasopharyngeal Swab     Status: None   Collection Time: 06/13/19  2:41 PM   Specimen: Nasopharyngeal Swab  Result Value Ref Range Status   SARS Coronavirus 2 NEGATIVE NEGATIVE Final    Comment: (NOTE) SARS-CoV-2 target nucleic acids are NOT DETECTED. The SARS-CoV-2 RNA is generally detectable in upper and lower respiratory specimens during the acute phase of infection.  Negative results do not preclude SARS-CoV-2 infection, do not rule out co-infections with other pathogens, and should not be used as the sole basis for treatment or other patient management decisions. Negative results must be combined with clinical observations, patient history, and epidemiological information. The expected result is Negative. Fact Sheet for Patients: SugarRoll.be Fact Sheet for Healthcare Providers: https://www.woods-mathews.com/ This test is not yet approved or cleared by the [place] FDA and  has been authorized for detection and/or diagnosis of SARS-CoV-2 by FDA under an Emergency Use Authorization (EUA). This EUA will remain  in effect (meaning this test can be used) for the duration of the COVID-19 declaration under Section 56 4(b)(1) of the Act, 21 U.S.C. section 360bbb-3(b)(1), unless the authorization is terminated or revoked sooner. Performed at [facility], [place] [address] [place], [place] [address]      Labs: BNP (last 3 results) Recent Labs    08/31/18 1226 09/11/18 1501 06/13/19 1320  BNP 313.3* 486.9* 998.3*   Basic Metabolic Panel: Recent Labs  Lab 06/13/19 1320 06/14/19 0453 06/15/19 0458 06/16/19 0425  NA 141 142 140 143  K 4.5 3.8 3.6 3.3*  CL 103 104 99 101  CO2 29 32 29 33*  GLUCOSE 159* 90 130* 97  BUN 19 17 19 16   CREATININE 1.17 1.17 1.16 1.20  CALCIUM 8.3* 8.2* 8.3* 8.3*   Liver Function Tests: Recent Labs  Lab 06/13/19 1320 06/15/19 0458  AST 48* 43*  ALT 27 22  ALKPHOS 103 92  BILITOT 3.0* 2.4*  PROT 6.0* 5.9*  ALBUMIN 2.7* 2.6*   No results for input(s): LIPASE, AMYLASE in the last 168 hours. Recent Labs  Lab 06/14/19 0920  AMMONIA 76*   CBC: Recent Labs  Lab 06/13/19 1320 06/14/19 0453 06/15/19 0458  WBC 10.9* 5.8 4.6  HGB 11.4* 10.9* 11.2*  HCT 36.1* 35.0* 35.0*  MCV 100.3* 100.0 98.3  PLT 57* 55* 58*   Cardiac Enzymes: No results for  input(s): CKTOTAL, CKMB, CKMBINDEX, TROPONINI in the last 168 hours. BNP: Invalid input(s): POCBNP CBG: Recent Labs  Lab 06/15/19 1115 06/15/19 1603 06/15/19 2125 06/16/19 0756 06/16/19 0842  GLUCAP 97 131* 175* 70 100*   D-Dimer No results for input(s): DDIMER in the last 72 hours. Hgb A1c No results for input(s): HGBA1C in the last 72 hours. Lipid Profile No results for input(s): CHOL, HDL, LDLCALC, TRIG, CHOLHDL, LDLDIRECT in the last 72 hours. Thyroid function studies No results for input(s): TSH, T4TOTAL, T3FREE, THYROIDAB in the last 72 hours.  Invalid input(s): FREET3 Anemia work up No results for input(s): VITAMINB12, FOLATE, FERRITIN, TIBC, IRON, RETICCTPCT in the last 72 hours. Urinalysis    Component Value Date/Time   COLORURINE STRAW (A) 09/11/2018 1732  APPEARANCEUR CLEAR 09/11/2018 1732   LABSPEC 1.005 09/11/2018 1732   PHURINE 7.0 09/11/2018 1732   GLUCOSEU NEGATIVE 09/11/2018 1732   HGBUR NEGATIVE 09/11/2018 1732   BILIRUBINUR NEGATIVE 09/11/2018 1732   KETONESUR NEGATIVE 09/11/2018 1732   PROTEINUR NEGATIVE 09/11/2018 1732   UROBILINOGEN 0.2 12/03/2012 1338   NITRITE NEGATIVE 09/11/2018 1732   LEUKOCYTESUR NEGATIVE 09/11/2018 1732   Sepsis Labs Invalid input(s): PROCALCITONIN,  WBC,  LACTICIDVEN Microbiology Recent Results (from the past 240 hour(s))  SARS CORONAVIRUS 2 (TAT 6-24 HRS) Nasopharyngeal Nasopharyngeal Swab     Status: None   Collection Time: 06/13/19  2:41 PM   Specimen: Nasopharyngeal Swab  Result Value Ref Range Status   SARS Coronavirus 2 NEGATIVE NEGATIVE Final    Comment: (NOTE) SARS-CoV-2 target nucleic acids are NOT DETECTED. The SARS-CoV-2 RNA is generally detectable in upper and lower respiratory specimens during the acute phase of infection. Negative results do not preclude SARS-CoV-2 infection, do not rule out co-infections with other pathogens, and should not be used as the sole basis for treatment or other patient  management decisions. Negative results must be combined with clinical observations, patient history, and epidemiological information. The expected result is Negative. Fact Sheet for Patients: SugarRoll.be Fact Sheet for Healthcare Providers: https://www.woods-mathews.com/ This test is not yet approved or cleared by the [place] FDA and  has been authorized for detection and/or diagnosis of SARS-CoV-2 by FDA under an Emergency Use Authorization (EUA). This EUA will remain  in effect (meaning this test can be used) for the duration of the COVID-19 declaration under Section 56 4(b)(1) of the Act, 21 U.S.C. section 360bbb-3(b)(1), unless the authorization is terminated or revoked sooner. Performed at [facility], [place] [address], [place], [place] [address]      Patient was seen and examined on the day of discharge and was found to be in stable condition. Time coordinating discharge: 40 minutes including assessment and coordination of care, as well as examination of the patient.   SIGNED:  Dessa Phi, DO [facility] 06/16/2019, 9:47 AM

## 2019-06-16 NOTE — Progress Notes (Signed)
CBG 70 at 0756. OJ given to patient with assistance. CBG 100 at recheck. Hypoglycemic Event  CBG: 70  Treatment: orange juice  Symptoms: none  Follow-up CBG: Time 0842 CBG Result:100  Possible Reasons for Event:  Poor oral intake   Comments/MD notified: Paged Choi at [phone].AM CBG 70; juice given.Recheck 100. Still give 20 units Lantus scheduled for 1000? Upon callback at [phone]; ok to hold Lantus.  Howard Pouch

## 2019-06-16 NOTE — Progress Notes (Signed)
Patient's wife Baker Janus had called me and left a message, I saw the patient yesterday spent 25 minutes with him at the bedside and discussed regarding end-of-life issues.  Patient is aware that he has medical issues that cannot be reversed and wants to go home.  I again spoke to his wife this morning, advised her that I will discontinue all unnecessary medication including aspirin, diabetes medications, will continue with prostate medication so he does not have any retention, but will also continue midodrine in case he starts walking not to have a fall.  Patient is willing as well as his wife to put the patient in hospice.  Agree with palliative team approach about using furosemide irrespective of serum creatinine to keep him comfortable.  Claretta Fraise that as long as she is available to help him through this, hospice will be a great help and also will be team members in taking care of the patient for end-of-life issues.  I do not think she will survive greater than 2 to 3 months at most.  Total time spent is 35 minutes in helping patient and family make decisions regarding EOL.   Adrian Prows, MD, [facility] 06/16/2019, 9:32 AM [facility] [place] Office: [phone]   1.  Permanent atrial fibrillation 2.  Acute on chronic kidney disease stage III, 3.  Cirrhosis of the liver 4.  Critical aortic stenosis 5.  Acute on chronic systolic heart failure.

## 2019-06-18 MED FILL — IPRAT-ALBUT 0.5-3(2.5) MG/3: 0.5-2.5 (3) | 10 days supply | Qty: 180 | Fill #0

## 2019-06-18 MED FILL — SPIRONOLACTONE 25 MG TABS: 25 | 10 days supply | Qty: 10 | Fill #0

## 2019-06-21 ENCOUNTER — Ambulatory Visit: Payer: Non-veteran care

## 2019-06-23 ENCOUNTER — Telehealth: Payer: Self-pay

## 2019-06-25 ENCOUNTER — Ambulatory Visit: Payer: Medicare Other | Admitting: Cardiology

## 2019-07-08 DEATH — deceased

## 2019-07-15 NOTE — Telephone Encounter (Signed)
error 

## 2019-11-12 ENCOUNTER — Ambulatory Visit: Payer: Medicare Other | Admitting: Cardiology

## 2020-03-21 IMAGING — CR DG CHEST 2V
2 series · 2 of 2 positions shown · non-contrast
Comparison: 09/11/2018

CLINICAL DATA: Shortness of breath, wheezing, and cough beginning
yesterday.

EXAM:
CHEST - 2 VIEW

[chest lat]
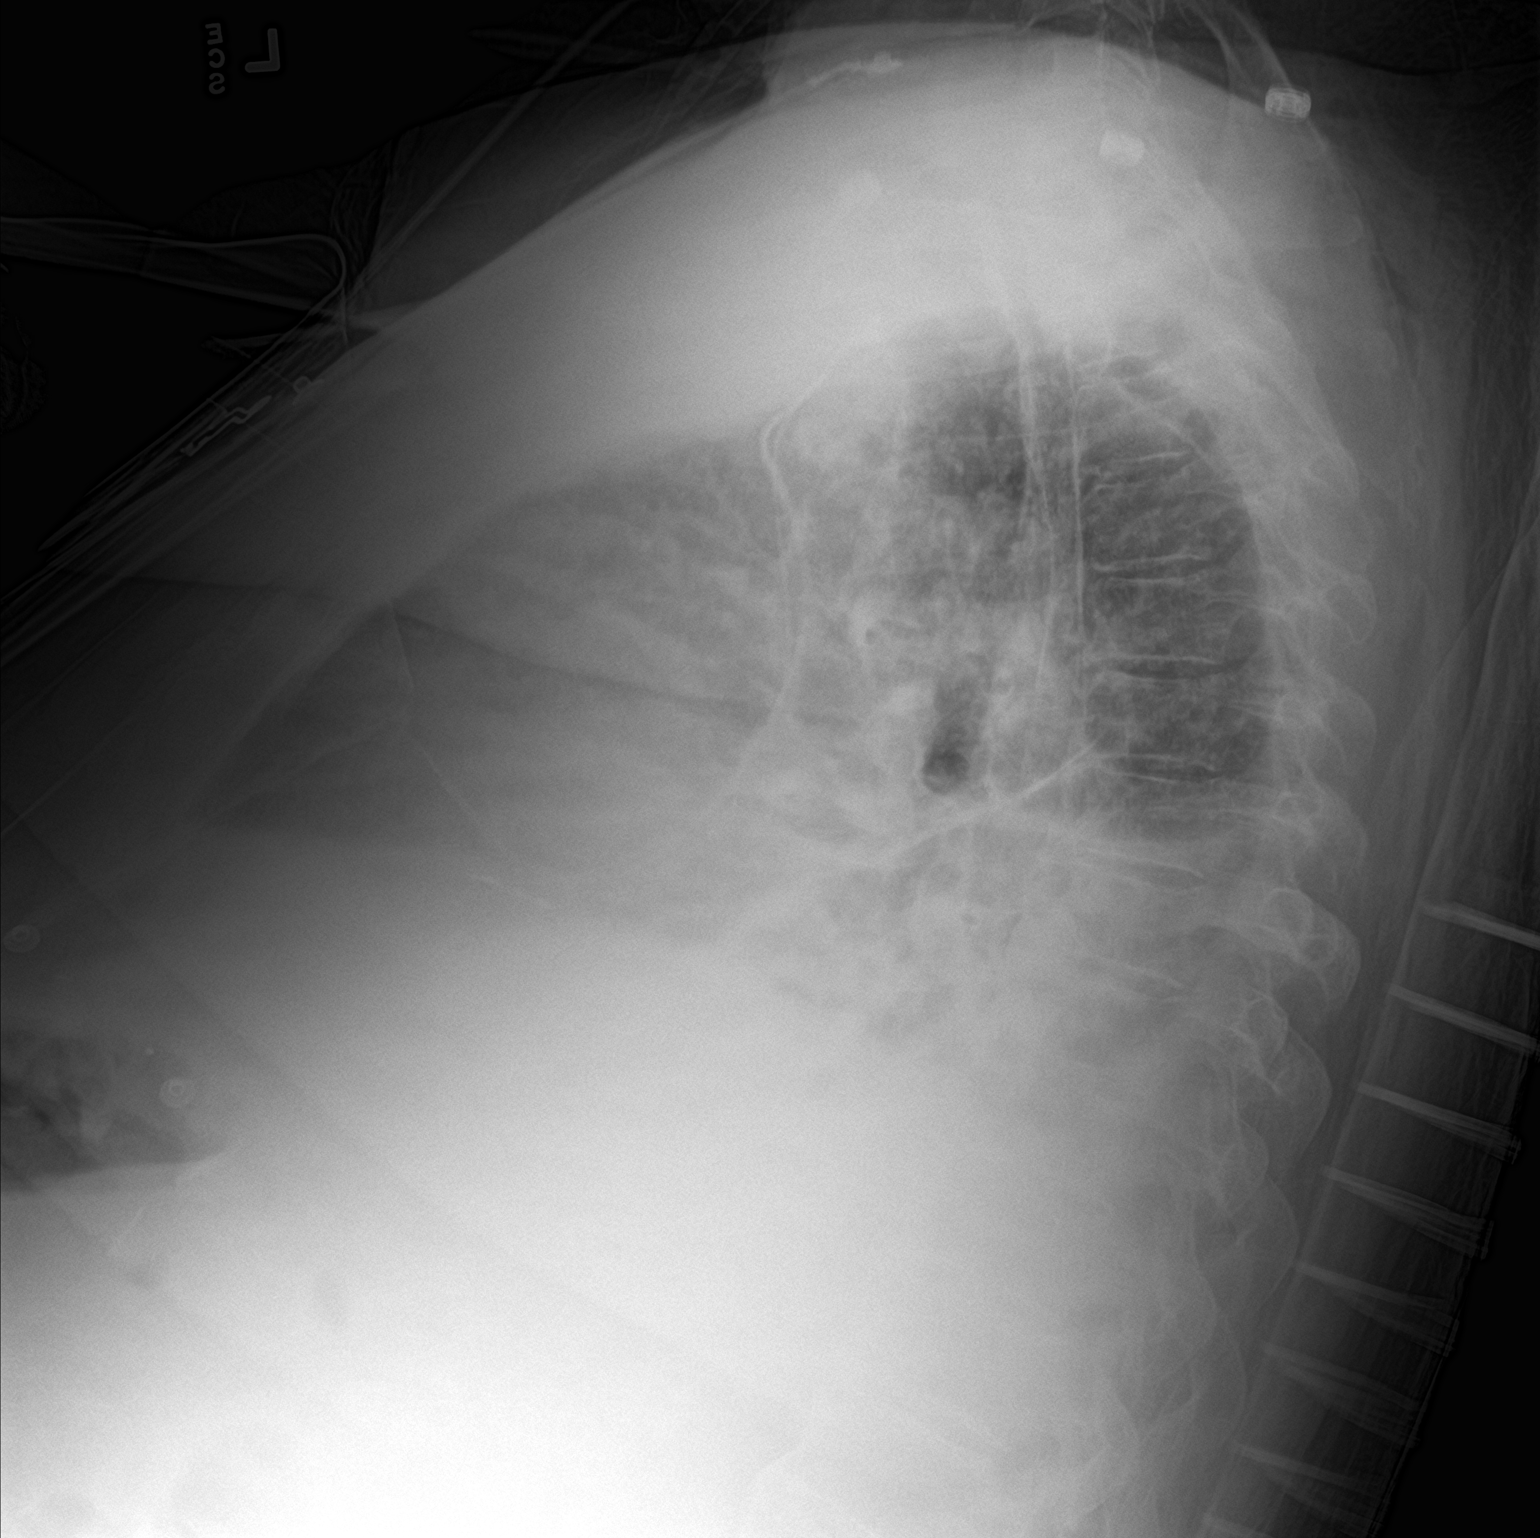

[chest ap]
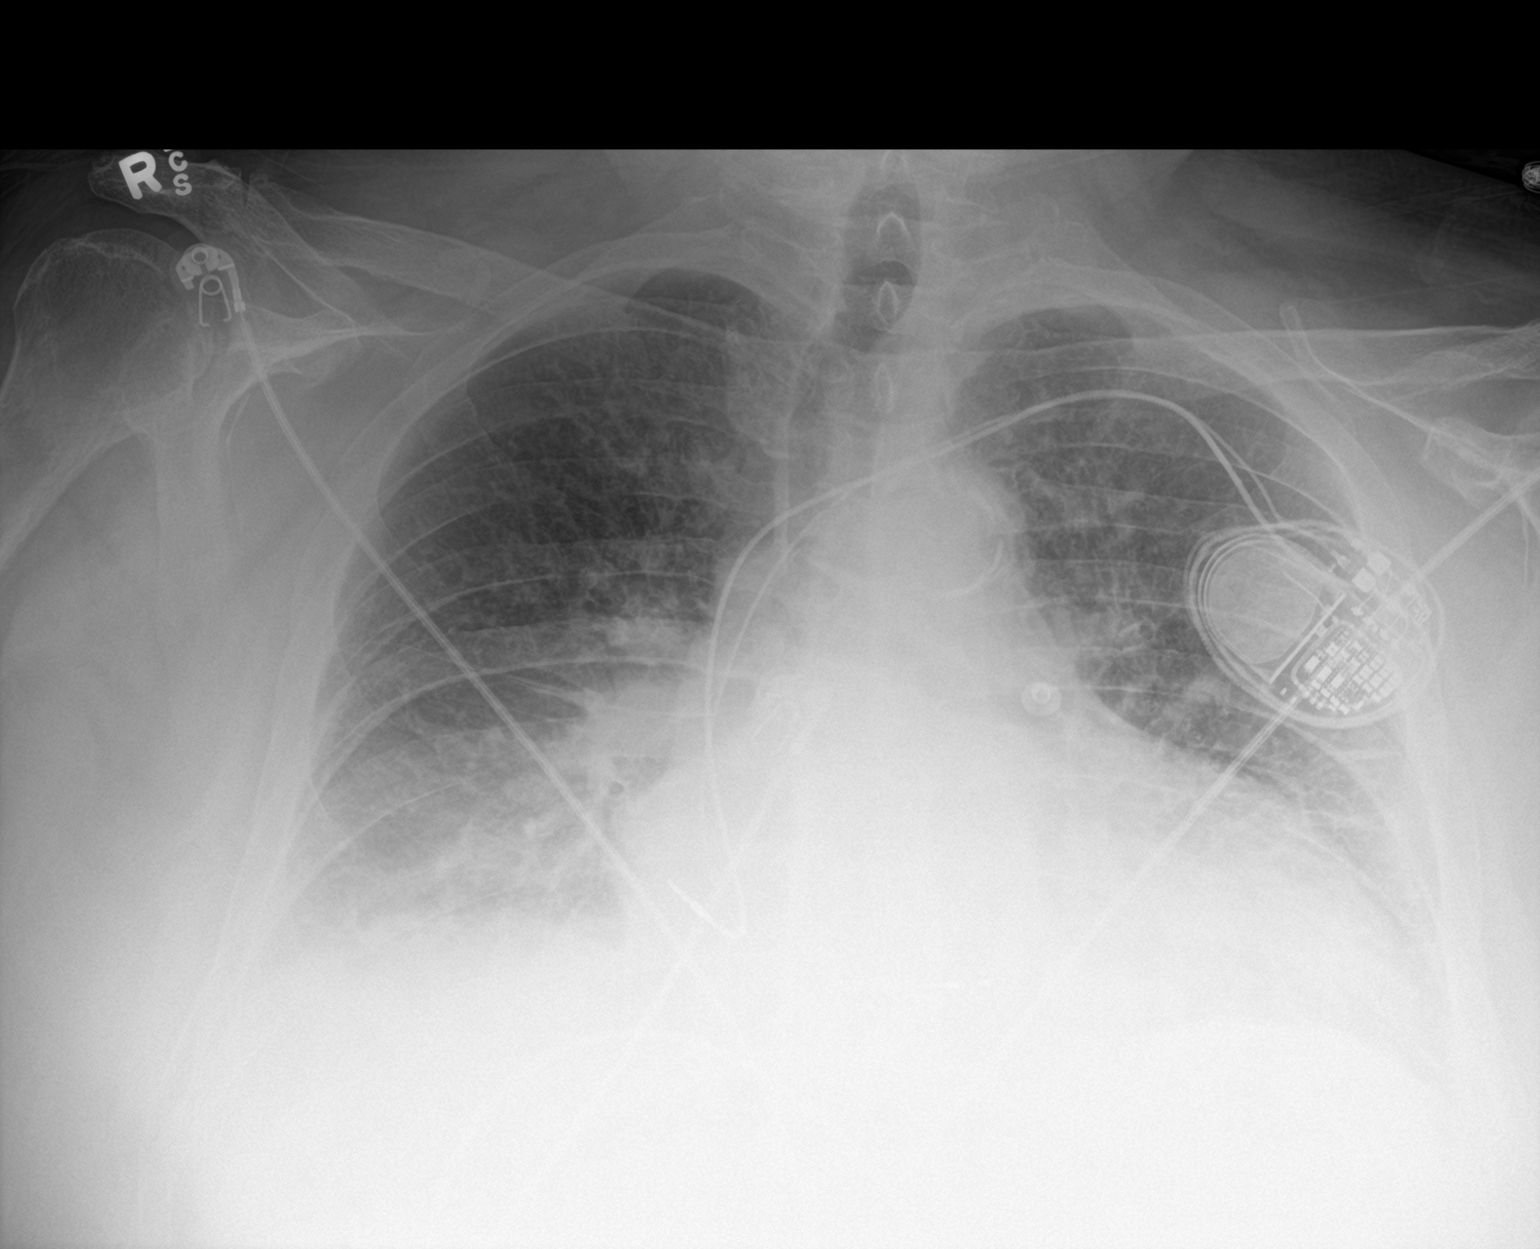

[2 of 2 positions shown; findings below may reference images not displayed]

FINDINGS: Cardiomegaly is stable and pacemaker remains in place. Aortic
atherosclerosis again noted.

Diffuse interstitial infiltrates are again seen, similar in
appearance to prior exam. No evidence of pulmonary consolidation.
Small bilateral pleural effusions also noted.
IMPRESSION: Findings consistent with mild congestive heart failure, with small
bilateral pleural effusions.
